# Patient Record
Sex: Female | Born: 1955 | Race: Black or African American | Hispanic: No | Marital: Married | Smoking: Never smoker
Health system: Southern US, Community
[De-identification: ages and names within clinical notes are randomized; demographics above are authoritative.]

## PROBLEM LIST (undated history)

## (undated) DIAGNOSIS — I639 Cerebral infarction, unspecified: Secondary | ICD-10-CM

## (undated) DIAGNOSIS — I82409 Acute embolism and thrombosis of unspecified deep veins of unspecified lower extremity: Secondary | ICD-10-CM

## (undated) DIAGNOSIS — I10 Essential (primary) hypertension: Secondary | ICD-10-CM

## (undated) DIAGNOSIS — E119 Type 2 diabetes mellitus without complications: Secondary | ICD-10-CM

## (undated) DIAGNOSIS — E785 Hyperlipidemia, unspecified: Secondary | ICD-10-CM

## (undated) HISTORY — PX: TUBAL LIGATION: SHX77

---

## 2016-04-02 ENCOUNTER — Ambulatory Visit: Payer: Self-pay | Admitting: Family Medicine

## 2018-08-11 ENCOUNTER — Other Ambulatory Visit: Payer: Self-pay

## 2018-08-18 LAB — CYTOLOGY - PAP: Diagnosis: NEGATIVE

## 2018-08-25 ENCOUNTER — Other Ambulatory Visit (HOSPITAL_COMMUNITY): Payer: Self-pay | Admitting: *Deleted

## 2018-08-25 DIAGNOSIS — Z1231 Encounter for screening mammogram for malignant neoplasm of breast: Secondary | ICD-10-CM

## 2018-10-10 ENCOUNTER — Encounter: Payer: Self-pay | Admitting: Physician Assistant

## 2018-10-10 ENCOUNTER — Ambulatory Visit: Payer: Self-pay | Admitting: Physician Assistant

## 2018-10-10 ENCOUNTER — Telehealth: Payer: Self-pay

## 2018-10-10 VITALS — BP 186/134 | HR 102 | Temp 98.3°F | Resp 16 | Ht 66.0 in | Wt 296.2 lb

## 2018-10-10 DIAGNOSIS — Z130 Encounter for screening for diseases of the blood and blood-forming organs and certain disorders involving the immune mechanism: Secondary | ICD-10-CM

## 2018-10-10 DIAGNOSIS — I10 Essential (primary) hypertension: Secondary | ICD-10-CM

## 2018-10-10 DIAGNOSIS — E1159 Type 2 diabetes mellitus with other circulatory complications: Secondary | ICD-10-CM | POA: Insufficient documentation

## 2018-10-10 DIAGNOSIS — Z1211 Encounter for screening for malignant neoplasm of colon: Secondary | ICD-10-CM

## 2018-10-10 DIAGNOSIS — E119 Type 2 diabetes mellitus without complications: Secondary | ICD-10-CM

## 2018-10-10 DIAGNOSIS — Z114 Encounter for screening for human immunodeficiency virus [HIV]: Secondary | ICD-10-CM

## 2018-10-10 DIAGNOSIS — Z1159 Encounter for screening for other viral diseases: Secondary | ICD-10-CM

## 2018-10-10 DIAGNOSIS — B351 Tinea unguium: Secondary | ICD-10-CM

## 2018-10-10 DIAGNOSIS — E1169 Type 2 diabetes mellitus with other specified complication: Secondary | ICD-10-CM | POA: Insufficient documentation

## 2018-10-10 MED ORDER — GLIMEPIRIDE 2 MG PO TABS: ORAL_TABLET | ORAL | 3 refills | Status: DC

## 2018-10-10 MED ORDER — LISINOPRIL 20 MG PO TABS: 20.0000 mg | ORAL_TABLET | Freq: Every day | ORAL | 0 refills | Status: DC

## 2018-10-10 NOTE — Telephone Encounter (Signed)
No answer LMTCB to talk about one of her medicines.

## 2018-10-10 NOTE — Telephone Encounter (Signed)
Per patient if you want to send something similar to the pharmacy she is fine with that and once her A1C  results come in and you think you need to make additional adjustments that we can let her know then, but right now she needs something to bring her sugar down.

## 2018-10-10 NOTE — Telephone Encounter (Signed)
Sent glimepiride 2 mg daily with a meal x 1 week. Then after one week can increase to 4 mg daily with meal.

## 2018-10-10 NOTE — Patient Instructions (Signed)

## 2018-10-10 NOTE — Progress Notes (Signed)
Patient: Samantha [name], Female    DOB: 07-10-1956, 62 y.o.   MRN: [id number] Visit Date: 10/10/2018  Today's Provider: Trey Sailors, PA-C   Chief Complaint  Patient presents with  . Establish Care   Subjective:    Annual physical exam Samantha [name] is a 62 y.o. female who presents today to establish care. She feels well. She reports exercising none. She reports she is sleeping well. Previously seen at [place], [place] at [place]. 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 Living here in [place] with  Husband of 40 years. Has four adult children. Lives with two adult children. Daughter getting masters in [place]. 23 year old son doing Network engineer. Has two grandchildren, one ages 14 and 2.5.   HTN: Was previously treated with 20 mg Lisinopril for her HTN. She is not having any chest pains or SOB.   BP Readings from Last 3 Encounters:  10/10/18 (!) 186/134   Diabetes: She reports she takes 10 mg of "glimpeiride" daily. Has had DM II since her late fifties. Started out on metformin and experienced itching with this. Last A1c unknown. Hasn't had diabetic eye exam in years. Has large toenails and is requesting referral to podiatry.   Flu shot: declined   PAP Smear: had this done at [place] as part of a cliinc - [facility]  Mammogram: Scheduled 11/2018 Colon Cancer Screening: never. No family history of colon cancer. Will do cologuard.    Wt Readings from Last 3 Encounters:  10/10/18 296 lb 3.2 oz (134.4 kg)    -----------------------------------------------------------------   Review of Systems  Constitutional: Negative.   HENT: Negative.   Eyes: Negative.   Respiratory: Negative.   Cardiovascular: Negative.   Gastrointestinal: Negative.   Endocrine: Negative.   Genitourinary: Negative.   Musculoskeletal: Negative.   Skin: Negative.   Allergic/Immunologic: Negative.   Neurological: Negative.   Hematological: Negative.     Psychiatric/Behavioral: Negative.     Social History      She  reports that she has never smoked. She has never used smokeless tobacco. She reports that she does not drink alcohol or use drugs.       Social History   Socioeconomic History  . Marital status: Married    Spouse name: Not on file  . Number of children: Not on file  . Years of education: Not on file  . Highest education level: Not on file  Occupational History  . Not on file  Social Needs  . Financial resource strain: Not on file  . Food insecurity:    Worry: Not on file    Inability: Not on file  . Transportation needs:    Medical: Not on file    Non-medical: Not on file  Tobacco Use  . Smoking status: Never Smoker  . Smokeless tobacco: Never Used  Substance and Sexual Activity  . Alcohol use: Never    Frequency: Never  . Drug use: Never  . Sexual activity: Not on file  Lifestyle  . Physical activity:    Days per week: Not on file    Minutes per session: Not on file  . Stress: Not on file  Relationships  . Social connections:    Talks on phone: Not on file    Gets together: Not on file    Attends religious service: Not on file    Active member of club or organization: Not on file    Attends meetings of  clubs or organizations: Not on file    Relationship status: Not on file  Other Topics Concern  . Not on file  Social History Narrative  . Not on file    History reviewed. No pertinent past medical history.   Patient Active Problem List   Diagnosis Date Noted  . Type 2 diabetes mellitus without complication, without long-term current use of insulin (HCC) 10/10/2018  . Hypertension 10/10/2018  . Morbid obesity (HCC) 10/10/2018    Past Surgical History:  Procedure Laterality Date  . TUBAL LIGATION     23 yrs ago    Family History        Family Status  Relation Name Status  . Mother  (Not Specified)  . MGM  Deceased       Alzheimers  . MGF  Deceased  . PGM  Deceased        Her  family history includes Hypertension in her mother.      No Known Allergies   Current Outpatient Medications:  .  glimepiride (AMARYL) 2 MG tablet, Start with 2 mg daily with a meal. After one week, can take 4 mg daily with a meal., Disp: 30 tablet, Rfl: 3 .  lisinopril (PRINIVIL,ZESTRIL) 20 MG tablet, Take 1 tablet (20 mg total) by mouth daily., Disp: 90 tablet, Rfl: 0   Patient Care Team: Maryella Shivers as PCP - General (Physician Assistant)      Objective:   Vitals: BP (!) 186/134 (BP Location: Right Arm, Patient Position: Sitting, Cuff Size: Large)   Pulse (!) 102   Temp 98.3 F (36.8 C) (Oral)   Resp 16   Ht 5\' 6"  (1.676 m)   Wt 296 lb 3.2 oz (134.4 kg)   BMI 47.81 kg/m    Vitals:   10/10/18 1417  BP: (!) 186/134  Pulse: (!) 102  Resp: 16  Temp: 98.3 F (36.8 C)  TempSrc: Oral  Weight: 296 lb 3.2 oz (134.4 kg)  Height: 5\' 6"  (1.676 m)     Physical Exam Constitutional:      Appearance: Normal appearance. She is obese.  Cardiovascular:     Rate and Rhythm: Normal rate and regular rhythm.  Pulmonary:     Effort: Pulmonary effort is normal.     Breath sounds: Normal breath sounds.  Skin:    General: Skin is warm and dry.  Neurological:     Mental Status: She is alert.    Diabetic Foot Exam - Simple   Simple Foot Form Diabetic Foot exam was performed with the following findings:  Yes 10/10/2018  4:50 PM  Visual Inspection No deformities, no ulcerations, no other skin breakdown bilaterally:  Yes Sensation Testing Intact to touch and monofilament testing bilaterally:  Yes Pulse Check Posterior Tibialis and Dorsalis pulse intact bilaterally:  Yes Comments Sensation present and normal. Onychomycosis present bilaterally.       Depression Screen PHQ 2/9 Scores 10/10/2018  PHQ - 2 Score 0      Assessment & Plan:     Routine Health Maintenance and Physical Exam  Exercise Activities and Dietary recommendations Goals   None       There is no immunization history on file for this patient.  Health Maintenance  Topic Date Due  . HEMOGLOBIN A1C  02-10-1956  . Hepatitis C Screening  26-May-1956  . PNEUMOCOCCAL POLYSACCHARIDE VACCINE AGE 35-64 HIGH RISK  03/16/1959  . FOOT EXAM  03/16/1967  . OPHTHALMOLOGY EXAM  03/16/1967  . HIV Screening  03/15/1972  . TETANUS/TDAP  03/15/1976  . MAMMOGRAM  03/16/2007  . COLONOSCOPY  03/16/2007  . INFLUENZA VACCINE  05/29/2018  . PAP SMEAR-Modifier  08/11/2021     Discussed health benefits of physical activity, and encouraged her to engage in regular exercise appropriate for her age and condition.    1. Type 2 diabetes mellitus without complication, without long-term current use of insulin (HCC)  She is unsure of her dose/name of medication. She says she was on "10 mg of glimepiride" but the max dose is 8 mg. We called pharmacy and she never had it filled there. She has been out of this for forty days. Counseled we can wait for A1c and start different medication that could help her lose weight. She wishes to start something now. Will send in 2 mg glimepiride and can increase to 4 mg after one week. Requesting records from previous PCP.  - Ambulatory referral to Ophthalmology - HgB A1c - Lipid Profile - Comprehensive Metabolic Panel (CMET) - Ambulatory referral to Podiatry  2. Colon cancer screening  - Cologuard  3. Hypertension, unspecified type  Restart 20 mg today. See her back in two weeks this is very elevated.  - lisinopril (PRINIVIL,ZESTRIL) 20 MG tablet; Take 1 tablet (20 mg total) by mouth daily.  Dispense: 90 tablet; Refill: 0  4. Screening for deficiency anemia  - CBC with Differential  5. Encounter for screening for HIV  - HIV antibody (with reflex)  6. Need for hepatitis C screening test  - Hepatitis C antibody  7. Onychomycosis  - Ambulatory referral to Podiatry  8. Morbid obesity (HCC)  Counseled on weight loss, can potentially change  dm medication to facilitate this.  Return in about 2 weeks (around 10/24/2018) for HTN.  The entirety of the information documented in the History of Present Illness, Review of Systems and Physical Exam were personally obtained by me. Portions of this information were initially documented by Hetty ElyJoseline Rosas, CMA and reviewed by me for thoroughness and accuracy.       --------------------------------------------------------------------    Trey SailorsAdriana M Pollak, PA-C  [facility]

## 2018-10-10 NOTE — Addendum Note (Signed)
Addended by: Trey SailorsPOLLAK, ADRIANA M on: 10/10/2018 04:31 PM   Modules accepted: Orders

## 2018-10-13 ENCOUNTER — Telehealth: Payer: Self-pay

## 2018-10-13 LAB — CBC WITH DIFFERENTIAL/PLATELET
Basophils Absolute: 0 10*3/uL (ref 0.0–0.2)
Basos: 0 %
EOS (ABSOLUTE): 0.2 10*3/uL (ref 0.0–0.4)
Eos: 3 %
Hematocrit: 40.7 % (ref 34.0–46.6)
Hemoglobin: 13.1 g/dL (ref 11.1–15.9)
Immature Grans (Abs): 0 10*3/uL (ref 0.0–0.1)
Immature Granulocytes: 0 %
Lymphocytes Absolute: 1.9 10*3/uL (ref 0.7–3.1)
Lymphs: 27 %
MCH: 30 pg (ref 26.6–33.0)
MCHC: 32.2 g/dL (ref 31.5–35.7)
MCV: 93 fL (ref 79–97)
Monocytes Absolute: 0.5 10*3/uL (ref 0.1–0.9)
Monocytes: 7 %
Neutrophils Absolute: 4.5 10*3/uL (ref 1.4–7.0)
Neutrophils: 63 %
Platelets: 158 10*3/uL (ref 150–450)
RBC: 4.36 x10E6/uL (ref 3.77–5.28)
RDW: 10.9 % — ABNORMAL LOW (ref 12.3–15.4)
WBC: 7.2 10*3/uL (ref 3.4–10.8)

## 2018-10-13 LAB — COMPREHENSIVE METABOLIC PANEL
ALT: 18 IU/L (ref 0–32)
AST: 16 IU/L (ref 0–40)
Albumin/Globulin Ratio: 1.1 — ABNORMAL LOW (ref 1.2–2.2)
Albumin: 3.7 g/dL (ref 3.6–4.8)
Alkaline Phosphatase: 96 IU/L (ref 39–117)
BUN/Creatinine Ratio: 15 (ref 12–28)
BUN: 12 mg/dL (ref 8–27)
Bilirubin Total: 0.3 mg/dL (ref 0.0–1.2)
CO2: 24 mmol/L (ref 20–29)
Calcium: 9.3 mg/dL (ref 8.7–10.3)
Chloride: 98 mmol/L (ref 96–106)
Creatinine, Ser: 0.79 mg/dL (ref 0.57–1.00)
GFR calc Af Amer: 93 mL/min/{1.73_m2} (ref >59)
GFR calc non Af Amer: 81 mL/min/{1.73_m2} (ref >59)
Globulin, Total: 3.3 g/dL (ref 1.5–4.5)
Glucose: 421 mg/dL — ABNORMAL HIGH (ref 65–99)
Potassium: 4.3 mmol/L (ref 3.5–5.2)
Sodium: 137 mmol/L (ref 134–144)
Total Protein: 7 g/dL (ref 6.0–8.5)

## 2018-10-13 LAB — HEMOGLOBIN A1C
Est. average glucose Bld gHb Est-mCnc: 358 mg/dL
Hgb A1c MFr Bld: 14.1 % — ABNORMAL HIGH (ref 4.8–5.6)

## 2018-10-13 LAB — LIPID PANEL
Chol/HDL Ratio: 3.4 ratio (ref 0.0–4.4)
Cholesterol, Total: 229 mg/dL — ABNORMAL HIGH (ref 100–199)
HDL: 68 mg/dL (ref >39)
LDL Calculated: 143 mg/dL — ABNORMAL HIGH (ref 0–99)
Triglycerides: 91 mg/dL (ref 0–149)
VLDL Cholesterol Cal: 18 mg/dL (ref 5–40)

## 2018-10-13 LAB — HEPATITIS C ANTIBODY: Hep C Virus Ab: <0.1 s/co ratio (ref 0.0–0.9)

## 2018-10-13 LAB — HIV ANTIBODY (ROUTINE TESTING W REFLEX): HIV Screen 4th Generation wRfx: NONREACTIVE

## 2018-10-13 NOTE — Telephone Encounter (Signed)
Patient advised as below. Patient verbalizes understanding and is in agreement with treatment plan.  

## 2018-10-13 NOTE — Telephone Encounter (Signed)
Patient advised as directed below.  Thanks,  -Emelee Rodocker 

## 2018-10-13 NOTE — Telephone Encounter (Signed)
LMTCB

## 2018-10-13 NOTE — Telephone Encounter (Signed)
-----   Message from Trey SailorsAdriana M Pollak, [place]PA-C sent at 10/13/2018  2:07 PM EST ----- A1c is 14.1. This is very high and very uncontrolled and she needs to be sure come to her follow up visit so we can talk about different medications for this. CBC normal. CMET normal except for very high sugar which is to be expected with her A1c. Cholesterol uncontrolled, will talk about at follow up. HIV and hepatitis C negative.

## 2018-10-17 ENCOUNTER — Telehealth: Payer: Self-pay

## 2018-10-17 NOTE — Telephone Encounter (Signed)
No answer/LMTCB  Patient is self pay and want to ask if she still wants to proceed with the Cologuard or we can do another test in office.

## 2018-10-20 NOTE — Telephone Encounter (Signed)
Call can not be completed at this time.  °

## 2018-10-20 NOTE — Telephone Encounter (Signed)
Noted thanks. This patient should be coming in for follow up and we can discuss it then.

## 2018-10-20 NOTE — Telephone Encounter (Signed)
Patient has appointment schedule for 10/31/2018.

## 2018-10-31 ENCOUNTER — Ambulatory Visit: Payer: Self-pay | Admitting: Physician Assistant

## 2018-12-02 ENCOUNTER — Ambulatory Visit (HOSPITAL_COMMUNITY): Payer: Self-pay

## 2018-12-19 ENCOUNTER — Telehealth: Payer: Self-pay | Admitting: Physician Assistant

## 2018-12-19 NOTE — Telephone Encounter (Signed)
-----   Message from Barrie Lyme sent at 12/15/2018  8:34 AM EST ----- Review incoming fax

## 2018-12-19 NOTE — Telephone Encounter (Signed)
Please call patient and schedule her for diabetes follow up. Her diabetes and blood pressure are extremely uncontrolled and she needs to come into clinic to address this.

## 2018-12-19 NOTE — Telephone Encounter (Signed)
lmtcb

## 2018-12-23 NOTE — Telephone Encounter (Signed)
lmtcb

## 2018-12-26 NOTE — Telephone Encounter (Signed)
lmtcb

## 2019-01-04 ENCOUNTER — Other Ambulatory Visit: Payer: Self-pay | Admitting: Physician Assistant

## 2019-01-04 DIAGNOSIS — E119 Type 2 diabetes mellitus without complications: Secondary | ICD-10-CM

## 2019-01-07 MED ORDER — GLIMEPIRIDE 4 MG PO TABS: 4.0000 mg | ORAL_TABLET | Freq: Every day | ORAL | 0 refills | Status: DC

## 2019-01-07 NOTE — Telephone Encounter (Signed)
Patient's diabetes is severely uncontrolled. This is very dangerous. She needs to come in for appointment before more refills. Have given 30 days of glimepiride and we need to address this in office.

## 2019-01-07 NOTE — Telephone Encounter (Signed)
Unable to reach patient at this time no voicemail box has been set up yet. KW

## 2019-01-28 ENCOUNTER — Other Ambulatory Visit: Payer: Self-pay

## 2019-01-28 DIAGNOSIS — E119 Type 2 diabetes mellitus without complications: Secondary | ICD-10-CM

## 2019-01-28 DIAGNOSIS — I10 Essential (primary) hypertension: Secondary | ICD-10-CM

## 2019-01-29 MED ORDER — GLIMEPIRIDE 4 MG PO TABS: 4.0000 mg | ORAL_TABLET | Freq: Every day | ORAL | 0 refills | Status: DC

## 2019-01-29 MED ORDER — LISINOPRIL 20 MG PO TABS: 20.0000 mg | ORAL_TABLET | Freq: Every day | ORAL | 0 refills | Status: DC

## 2019-02-02 ENCOUNTER — Ambulatory Visit: Payer: Self-pay | Admitting: Physician Assistant

## 2019-02-02 NOTE — Progress Notes (Deleted)
       Patient: Samantha [name] Female    DOB: 09/23/1956   63 y.o.   MRN: [id number] Visit Date: 02/02/2019  Today's Provider: Trey Sailors, PA-C   No chief complaint on file.  Subjective:     HPI  Diabetes Mellitus Type II, Follow-up:   Lab Results  Component Value Date   HGBA1C 14.1 (H) 10/10/2018    Last seen for diabetes {1-12:18279} {days/wks/mos/yrs:310907} ago.  Management since then includes ***. She reports {excellent/good/fair/poor:19665} compliance with treatment. She {ACTION; IS/IS RAX:[id number]} having side effects. *** Current symptoms include {Symptoms; diabetes:14075} and have been {Desc; course:15616}. Home blood sugar records: {diabetes glucometry results:16657}  Episodes of hypoglycemia? {yes***/no:17258}   Current Insulin Regimen: *** Most Recent Eye Exam: *** Weight trend: {trend:16658} {Prior visit with dietician:20300:::1} {Current exercise:16438:::1} {Current diet habits:16563:::1}  Pertinent Labs:    Component Value Date/Time   CHOL 229 (H) 10/10/2018 1518   TRIG 91 10/10/2018 1518   HDL 68 10/10/2018 1518   LDLCALC 143 (H) 10/10/2018 1518   CREATININE 0.79 10/10/2018 1518    Wt Readings from Last 3 Encounters:  10/10/18 296 lb 3.2 oz (134.4 kg)    ------------------------------------------------------------------------  No Known Allergies   Current Outpatient Medications:  .  glimepiride (AMARYL) 4 MG tablet, Take 1 tablet (4 mg total) by mouth daily before breakfast., Disp: 30 tablet, Rfl: 0 .  lisinopril (PRINIVIL,ZESTRIL) 20 MG tablet, Take 1 tablet (20 mg total) by mouth daily., Disp: 90 tablet, Rfl: 0  Review of Systems  Constitutional: Negative.   Eyes: Negative.   Cardiovascular: Negative.   Endocrine: Negative.     Social History   Tobacco Use  . Smoking status: Never Smoker  . Smokeless tobacco: Never Used  Substance Use Topics  . Alcohol use: Never    Frequency: Never      Objective:   There were no  vitals taken for this visit. There were no vitals filed for this visit.   Physical Exam      Assessment & Plan        Trey Sailors, PA-C  [facility]

## 2019-03-13 ENCOUNTER — Other Ambulatory Visit: Payer: Self-pay

## 2019-03-13 DIAGNOSIS — E119 Type 2 diabetes mellitus without complications: Secondary | ICD-10-CM

## 2019-03-13 MED ORDER — GLIMEPIRIDE 4 MG PO TABS: 4.0000 mg | ORAL_TABLET | Freq: Every day | ORAL | 0 refills | Status: DC

## 2019-03-13 NOTE — Telephone Encounter (Signed)
Patient called requesting refills on medications. She reports that she has a follow up appt with you on 03/17/19

## 2019-03-13 NOTE — Telephone Encounter (Signed)
I have given four days to get her to the appointment. Must keep appointment.

## 2019-03-17 ENCOUNTER — Encounter: Payer: Self-pay | Admitting: Physician Assistant

## 2019-03-17 ENCOUNTER — Ambulatory Visit (INDEPENDENT_AMBULATORY_CARE_PROVIDER_SITE_OTHER): Payer: Self-pay | Admitting: Physician Assistant

## 2019-03-17 ENCOUNTER — Other Ambulatory Visit: Payer: Self-pay

## 2019-03-17 VITALS — BP 206/118 | HR 103 | Temp 98.6°F | Resp 16 | Wt 296.2 lb

## 2019-03-17 DIAGNOSIS — I10 Essential (primary) hypertension: Secondary | ICD-10-CM

## 2019-03-17 DIAGNOSIS — E119 Type 2 diabetes mellitus without complications: Secondary | ICD-10-CM

## 2019-03-17 LAB — POCT GLYCOSYLATED HEMOGLOBIN (HGB A1C)
Est. average glucose Bld gHb Est-mCnc: 263
Hemoglobin A1C: 10.8 % — AB (ref 4.0–5.6)

## 2019-03-17 MED ORDER — LISINOPRIL 40 MG PO TABS: 40.0000 mg | ORAL_TABLET | Freq: Every day | ORAL | 0 refills | Status: DC

## 2019-03-17 MED ORDER — GLIMEPIRIDE 4 MG PO TABS: ORAL_TABLET | ORAL | 0 refills | Status: DC

## 2019-03-17 NOTE — Patient Instructions (Addendum)
Take one and a half pills of glimepiride 4 mg daily for two weeks. Then take 2 pills of 4mg  glimepiride daily. This is for diabetes. We are increasing Lisinopril to 40 mg daily.     Diabetes Mellitus and Nutrition, Adult When you have diabetes (diabetes mellitus), it is very important to have healthy eating habits because your blood sugar (glucose) levels are greatly affected by what you eat and drink. Eating healthy foods in the appropriate amounts, at about the same times every day, can help you:  Control your blood glucose.  Lower your risk of heart disease.  Improve your blood pressure.  Reach or maintain a healthy weight. Every person with diabetes is different, and each person has different needs for a meal plan. Your health care provider may recommend that you work with a diet and nutrition specialist (dietitian) to make a meal plan that is best for you. Your meal plan may vary depending on factors such as:  The calories you need.  The medicines you take.  Your weight.  Your blood glucose, blood pressure, and cholesterol levels.  Your activity level.  Other health conditions you have, such as heart or kidney disease. How do carbohydrates affect me? Carbohydrates, also called carbs, affect your blood glucose level more than any other type of food. Eating carbs naturally raises the amount of glucose in your blood. Carb counting is a method for keeping track of how many carbs you eat. Counting carbs is important to keep your blood glucose at a healthy level, especially if you use insulin or take certain oral diabetes medicines. It is important to know how many carbs you can safely have in each meal. This is different for every person. Your dietitian can help you calculate how many carbs you should have at each meal and for each snack. Foods that contain carbs include:  Bread, cereal, rice, pasta, and crackers.  Potatoes and corn.  Peas, beans, and lentils.  Milk and yogurt.   Fruit and juice.  Desserts, such as cakes, cookies, ice cream, and candy. How does alcohol affect me? Alcohol can cause a sudden decrease in blood glucose (hypoglycemia), especially if you use insulin or take certain oral diabetes medicines. Hypoglycemia can be a life-threatening condition. Symptoms of hypoglycemia (sleepiness, dizziness, and confusion) are similar to symptoms of having too much alcohol. If your health care provider says that alcohol is safe for you, follow these guidelines:  Limit alcohol intake to no more than 1 drink per day for nonpregnant women and 2 drinks per day for men. One drink equals 12 oz of beer, 5 oz of wine, or 1 oz of hard liquor.  Do not drink on an empty stomach.  Keep yourself hydrated with water, diet soda, or unsweetened iced tea.  Keep in mind that regular soda, juice, and other mixers may contain a lot of sugar and must be counted as carbs. What are tips for following this plan?  Reading food labels  Start by checking the serving size on the "Nutrition Facts" label of packaged foods and drinks. The amount of calories, carbs, fats, and other nutrients listed on the label is based on one serving of the item. Many items contain more than one serving per package.  Check the total grams (g) of carbs in one serving. You can calculate the number of servings of carbs in one serving by dividing the total carbs by 15. For example, if a food has 30 g of total carbs, it would  be equal to 2 servings of carbs.  Check the number of grams (g) of saturated and trans fats in one serving. Choose foods that have low or no amount of these fats.  Check the number of milligrams (mg) of salt (sodium) in one serving. Most people should limit total sodium intake to less than 2,300 mg per day.  Always check the nutrition information of foods labeled as "low-fat" or "nonfat". These foods may be higher in added sugar or refined carbs and should be avoided.  Talk to your  dietitian to identify your daily goals for nutrients listed on the label. Shopping  Avoid buying canned, premade, or processed foods. These foods tend to be high in fat, sodium, and added sugar.  Shop around the outside edge of the grocery store. This includes fresh fruits and vegetables, bulk grains, fresh meats, and fresh dairy. Cooking  Use low-heat cooking methods, such as baking, instead of high-heat cooking methods like deep frying.  Cook using healthy oils, such as olive, canola, or sunflower oil.  Avoid cooking with butter, cream, or high-fat meats. Meal planning  Eat meals and snacks regularly, preferably at the same times every day. Avoid going long periods of time without eating.  Eat foods high in fiber, such as fresh fruits, vegetables, beans, and whole grains. Talk to your dietitian about how many servings of carbs you can eat at each meal.  Eat 4-6 ounces (oz) of lean protein each day, such as lean meat, chicken, fish, eggs, or tofu. One oz of lean protein is equal to: ? 1 oz of meat, chicken, or fish. ? 1 egg. ?  cup of tofu.  Eat some foods each day that contain healthy fats, such as avocado, nuts, seeds, and fish. Lifestyle  Check your blood glucose regularly.  Exercise regularly as told by your health care provider. This may include: ? 150 minutes of moderate-intensity or vigorous-intensity exercise each week. This could be brisk walking, biking, or water aerobics. ? Stretching and doing strength exercises, such as yoga or weightlifting, at least 2 times a week.  Take medicines as told by your health care provider.  Do not use any products that contain nicotine or tobacco, such as cigarettes and e-cigarettes. If you need help quitting, ask your health care provider.  Work with a Veterinary surgeoncounselor or diabetes educator to identify strategies to manage stress and any emotional and social challenges. Questions to ask a health care provider  Do I need to meet with a  diabetes educator?  Do I need to meet with a dietitian?  What number can I call if I have questions?  When are the best times to check my blood glucose? Where to find more information:  American Diabetes Association: diabetes.org  Academy of Nutrition and Dietetics: www.eatright.[employer]  National Institute of Diabetes and Digestive and Kidney Diseases (NIH): CarFlippers.tnwww.niddk.nih.gov Summary  A healthy meal plan will help you control your blood glucose and maintain a healthy lifestyle.  Working with a diet and nutrition specialist (dietitian) can help you make a meal plan that is best for you.  Keep in mind that carbohydrates (carbs) and alcohol have immediate effects on your blood glucose levels. It is important to count carbs and to use alcohol carefully. This information is not intended to replace advice given to you by your health care provider. Make sure you discuss any questions you have with your health care provider. Document Released: 07/12/2005 Document Revised: 05/15/2017 Document Reviewed: 11/19/2016 Elsevier Interactive Patient Education  2019 [place].

## 2019-03-17 NOTE — Progress Notes (Signed)
Patient: Samantha [name] Female    DOB: 1956/05/14   63 y.o.   MRN: [id number] Visit Date: 03/17/2019  Today's Provider: Trey SailorsAdriana M Janeice Stegall, PA-C   Chief Complaint  Patient presents with  . Follow-up    T2DM and HTN   Subjective:     HPI   Diabetes Mellitus Type II, Follow-up:   Lab Results  Component Value Date   HGBA1C 10.8 (A) 03/17/2019   HGBA1C 14.1 (H) 10/10/2018   Last seen for diabetes 5 months ago.  Management since then includes started patient on 2 mg glimepiride and can increase to 4 mg after one week.  She reports good compliance with treatment. She is not having side effects.  Current symptoms include none and have been stable. Home blood sugar records: sometimes and when she does is at  200's +  Episodes of hypoglycemia? no   Current Insulin Regimen:  Most Recent Eye Exam: she is due. She needs to schedule an appt. Weight trend: stable Current diet: in general, a "healthy" diet   Current exercise: walking and weights  ------------------------------------------------------------------------   Hypertension, follow-up:  BP Readings from Last 3 Encounters:  03/17/19 (!) 206/118  10/10/18 (!) 186/134    She was last seen for hypertension 5 months ago.  BP at that visit was 186/134 Management since that visit includes Restart Lisinopril 20 mg .Return in two weeks. She reports excellent compliance with treatment. She is not having side effects.  She is adherent to low salt diet.   Outside blood pressures are 140's/88's She is experiencing none.  Patient denies chest pain, chest pressure/discomfort, exertional chest pressure/discomfort, fatigue, irregular heart beat, lower extremity edema, near-syncope and palpitations.   Cardiovascular risk factors include diabetes mellitus, hypertension and obesity (BMI >= 30 kg/m2).   Wt Readings from Last 3 Encounters:  03/17/19 296 lb 3.2 oz (134.4 kg)  10/10/18 296 lb 3.2 oz (134.4 kg)   Reports she is  waiting for her insurance to activate. Reports she has trouble attending appointments because she has multiple children and also several grandchildren who live with her.   ------------------------------------------------------------------------     No Known Allergies   Current Outpatient Medications:  .  glimepiride (AMARYL) 4 MG tablet, Take 1 tablet (4 mg total) by mouth daily before breakfast., Disp: 4 tablet, Rfl: 0 .  lisinopril (PRINIVIL,ZESTRIL) 20 MG tablet, Take 1 tablet (20 mg total) by mouth daily., Disp: 90 tablet, Rfl: 0  Review of Systems  Social History   Tobacco Use  . Smoking status: Never Smoker  . Smokeless tobacco: Never Used  Substance Use Topics  . Alcohol use: Never    Frequency: Never      Objective:   BP (!) 206/118 (BP Location: Left Arm, Patient Position: Sitting, Cuff Size: Large)   Pulse (!) 103   Temp 98.6 F (37 C) (Oral)   Resp 16   Wt 296 lb 3.2 oz (134.4 kg)   BMI 47.81 kg/m  Vitals:   03/17/19 1110  BP: (!) 206/118  Pulse: (!) 103  Resp: 16  Temp: 98.6 F (37 C)  TempSrc: Oral  Weight: 296 lb 3.2 oz (134.4 kg)     Physical Exam Constitutional:      Appearance: Normal appearance. She is obese.  Cardiovascular:     Rate and Rhythm: Normal rate and regular rhythm.     Heart sounds: Normal heart sounds.  Pulmonary:     Effort: Pulmonary effort is normal.  Breath sounds: Normal breath sounds.  Skin:    General: Skin is warm and dry.  Neurological:     Mental Status: She is alert. Mental status is at baseline.         Assessment & Plan    1. Type 2 diabetes mellitus without complication, without long-term current use of insulin (HCC)  A1c has decreased from 14.1% to 10.8% and remains uncontrolled. I will have her titrate up on glimepiride as below. She has an intolerance to metformin and no insurance coverage right now so I think this will be the most affordable to her right now. Counseled that she needs to be  compliant with office visits.   - POCT glycosylated hemoglobin (Hb A1C) - glimepiride (AMARYL) 4 MG tablet; Take 1.5 tablets (6 mg total) by mouth daily before breakfast for 14 days, THEN 2 tablets (8 mg total) daily before breakfast.  Dispense: 161 tablet; Refill: 0  2. Hypertension, unspecified type  Very uncontrolled today. She is asymptomatic. She reports it can be lower than this. She reports when she was on Lisinopril 20 mg previously that her BP was controlled. Will increase lisinopril to 40 mg daily as patient does not want to add second medication right now. Follow up 2-4 weeks.   - lisinopril (ZESTRIL) 40 MG tablet; Take 1 tablet (40 mg total) by mouth daily.  Dispense: 90 tablet; Refill: 0  The entirety of the information documented in the History of Present Illness, Review of Systems and Physical Exam were personally obtained by me. Portions of this information were initially documented by Sheliah Hatch, CMA and reviewed by me for thoroughness and accuracy.         Trey Sailors, PA-C  [facility]

## 2019-07-30 ENCOUNTER — Other Ambulatory Visit: Payer: Self-pay | Admitting: Physician Assistant

## 2019-07-30 DIAGNOSIS — E119 Type 2 diabetes mellitus without complications: Secondary | ICD-10-CM

## 2019-07-30 DIAGNOSIS — I10 Essential (primary) hypertension: Secondary | ICD-10-CM

## 2019-07-30 NOTE — Telephone Encounter (Signed)
Pt has OV 08/04/2019, but was supposed to come back in June I think.  Please advise.   Thanks,   -Mickel Baas

## 2019-07-30 NOTE — Telephone Encounter (Signed)
Gave 30 days.

## 2019-08-04 ENCOUNTER — Ambulatory Visit: Payer: Self-pay | Admitting: Physician Assistant

## 2019-09-22 ENCOUNTER — Ambulatory Visit (INDEPENDENT_AMBULATORY_CARE_PROVIDER_SITE_OTHER): Payer: Self-pay | Admitting: Physician Assistant

## 2019-09-22 ENCOUNTER — Encounter: Payer: Self-pay | Admitting: Physician Assistant

## 2019-09-22 ENCOUNTER — Other Ambulatory Visit: Payer: Self-pay

## 2019-09-22 VITALS — BP 176/111 | HR 65 | Temp 97.1°F | Wt 279.6 lb

## 2019-09-22 DIAGNOSIS — R0781 Pleurodynia: Secondary | ICD-10-CM

## 2019-09-22 DIAGNOSIS — E119 Type 2 diabetes mellitus without complications: Secondary | ICD-10-CM

## 2019-09-22 DIAGNOSIS — E785 Hyperlipidemia, unspecified: Secondary | ICD-10-CM

## 2019-09-22 DIAGNOSIS — S0990XA Unspecified injury of head, initial encounter: Secondary | ICD-10-CM

## 2019-09-22 DIAGNOSIS — I10 Essential (primary) hypertension: Secondary | ICD-10-CM

## 2019-09-22 LAB — POCT GLYCOSYLATED HEMOGLOBIN (HGB A1C)
Est. average glucose Bld gHb Est-mCnc: 272
Hemoglobin A1C: 11.1 % — AB (ref 4.0–5.6)

## 2019-09-22 MED ORDER — AMLODIPINE BESYLATE 5 MG PO TABS: ORAL_TABLET | ORAL | 0 refills | Status: DC

## 2019-09-22 MED ORDER — LISINOPRIL 40 MG PO TABS: 40.0000 mg | ORAL_TABLET | Freq: Every day | ORAL | 1 refills | Status: DC

## 2019-09-22 MED ORDER — GLIMEPIRIDE 4 MG PO TABS: 8.0000 mg | ORAL_TABLET | Freq: Every day | ORAL | 2 refills | Status: DC

## 2019-09-22 NOTE — Patient Instructions (Signed)
Diabetes Mellitus and Exercise Exercising regularly is important for your overall health, especially when you have diabetes (diabetes mellitus). Exercising is not only about losing weight. It has many other health benefits, such as increasing muscle strength and bone density and reducing body fat and stress. This leads to improved fitness, flexibility, and endurance, all of which result in better overall health. Exercise has additional benefits for people with diabetes, including:  Reducing appetite.  Helping to lower and control blood glucose.  Lowering blood pressure.  Helping to control amounts of fatty substances (lipids) in the blood, such as cholesterol and triglycerides.  Helping the body to respond better to insulin (improving insulin sensitivity).  Reducing how much insulin the body needs.  Decreasing the risk for heart disease by: ? Lowering cholesterol and triglyceride levels. ? Increasing the levels of good cholesterol. ? Lowering blood glucose levels. What is my activity plan? Your health care provider or certified diabetes educator can help you make a plan for the type and frequency of exercise (activity plan) that works for you. Make sure that you:  Do at least 150 minutes of moderate-intensity or vigorous-intensity exercise each week. This could be brisk walking, biking, or water aerobics. ? Do stretching and strength exercises, such as yoga or weightlifting, at least 2 times a week. ? Spread out your activity over at least 3 days of the week.  Get some form of physical activity every day. ? Do not go more than 2 days in a row without some kind of physical activity. ? Avoid being inactive for more than 30 minutes at a time. Take frequent breaks to walk or stretch.  Choose a type of exercise or activity that you enjoy, and set realistic goals.  Start slowly, and gradually increase the intensity of your exercise over time. What do I need to know about managing my  diabetes?   Check your blood glucose before and after exercising. ? If your blood glucose is 240 mg/dL (13.3 mmol/L) or higher before you exercise, check your urine for ketones. If you have ketones in your urine, do not exercise until your blood glucose returns to normal. ? If your blood glucose is 100 mg/dL (5.6 mmol/L) or lower, eat a snack containing 15-20 grams of carbohydrate. Check your blood glucose 15 minutes after the snack to make sure that your level is above 100 mg/dL (5.6 mmol/L) before you start your exercise.  Know the symptoms of low blood glucose (hypoglycemia) and how to treat it. Your risk for hypoglycemia increases during and after exercise. Common symptoms of hypoglycemia can include: ? Hunger. ? Anxiety. ? Sweating and feeling clammy. ? Confusion. ? Dizziness or feeling light-headed. ? Increased heart rate or palpitations. ? Blurry vision. ? Tingling or numbness around the mouth, lips, or tongue. ? Tremors or shakes. ? Irritability.  Keep a rapid-acting carbohydrate snack available before, during, and after exercise to help prevent or treat hypoglycemia.  Avoid injecting insulin into areas of the body that are going to be exercised. For example, avoid injecting insulin into: ? The arms, when playing tennis. ? The legs, when jogging.  Keep records of your exercise habits. Doing this can help you and your health care provider adjust your diabetes management plan as needed. Write down: ? Food that you eat before and after you exercise. ? Blood glucose levels before and after you exercise. ? The type and amount of exercise you have done. ? When your insulin is expected to peak, if you use   insulin. Avoid exercising at times when your insulin is peaking.  When you start a new exercise or activity, work with your health care provider to make sure the activity is safe for you, and to adjust your insulin, medicines, or food intake as needed.  Drink plenty of water while  you exercise to prevent dehydration or heat stroke. Drink enough fluid to keep your urine clear or pale yellow. Summary  Exercising regularly is important for your overall health, especially when you have diabetes (diabetes mellitus).  Exercising has many health benefits, such as increasing muscle strength and bone density and reducing body fat and stress.  Your health care provider or certified diabetes educator can help you make a plan for the type and frequency of exercise (activity plan) that works for you.  When you start a new exercise or activity, work with your health care provider to make sure the activity is safe for you, and to adjust your insulin, medicines, or food intake as needed. This information is not intended to replace advice given to you by your health care provider. Make sure you discuss any questions you have with your health care provider. Document Released: 01/05/2004 Document Revised: 05/09/2017 Document Reviewed: 03/26/2016 Elsevier Patient Education  2020 Elsevier Inc.  

## 2019-09-22 NOTE — Progress Notes (Signed)
Patient: Samantha [name] Female    DOB: 1956-04-04   63 y.o.   MRN: [id number] Visit Date: 09/22/2019  Today's Provider: Trinna Post, PA-C   Chief Complaint  Patient presents with  . Hypertension  . Diabetes   Subjective:     HPI  Diabetes Mellitus Type II, Follow-up:   Lab Results  Component Value Date   HGBA1C 10.8 (A) 03/17/2019   HGBA1C 14.1 (H) 10/10/2018   Last seen for diabetes 6 months ago.  Management since then includes increasing glimepiride to 8 mg daily. She reports good compliance with treatment. She is not having side effects.  Current symptoms include none and have been stable. Home blood sugar records: 200's  Episodes of hypoglycemia? no   Current Insulin Regimen: no Most Recent Eye Exam: Not UTD Weight trend: stable Prior visit with dietician: no Current diet: well balanced Current exercise: none  ------------------------------------------------------------------------   Hypertension, follow-up:  BP Readings from Last 3 Encounters:  09/22/19 (!) 176/111  03/17/19 (!) 206/118  10/10/18 (!) 186/134    She was last seen for hypertension 6 months ago.  BP at that visit was 206/118. Management since that visit includes increasing Lisinopril to 40 mg QD.She reports good compliance with treatment. She is not having side effects.  She is not exercising. She is adherent to low salt diet.   Outside blood pressures are not checked. She is experiencing none.  Patient denies chest pain, chest pressure/discomfort, exertional chest pressure/discomfort, fatigue, irregular heart beat and palpitations.   Cardiovascular risk factors include diabetes mellitus and hypertension.  Use of agents associated with hypertension: none.   Monday 16, 2020   Wt Readings from Last 3 Encounters:  09/22/19 279 lb 9.6 oz (126.8 kg)  03/17/19 296 lb 3.2 oz (134.4 kg)  10/10/18 296 lb 3.2 oz (134.4 kg)   Head Injury: Patient fell 09/14/2019. She has  bruises below her eyes that appeared within three days of injury. She denies headache or rhinorrhea. Denies confusion. Denies vomiting. Reports pain around abdomen and ribcage that does not increase with breathing. Denies fever, chills, vision change.  ------------------------------------------------------------------------  No Known Allergies   Current Outpatient Medications:  .  lisinopril (ZESTRIL) 40 MG tablet, Take 1 tablet by mouth once daily, Disp: 30 tablet, Rfl: 0 .  glimepiride (AMARYL) 4 MG tablet, Take 2 tablets (8 mg total) by mouth daily with breakfast., Disp: 60 tablet, Rfl: 0  Review of Systems  Constitutional: Negative.   Respiratory: Negative.   Genitourinary: Negative.   Neurological: Negative.     Social History   Tobacco Use  . Smoking status: Never Smoker  . Smokeless tobacco: Never Used  Substance Use Topics  . Alcohol use: Never    Frequency: Never      Objective:   BP (!) 176/111 (BP Location: Left Arm, Patient Position: Sitting, Cuff Size: Large)   Pulse 65   Temp (!) 97.1 F (36.2 C) (Temporal)   Wt 279 lb 9.6 oz (126.8 kg)   BMI 45.13 kg/m  Vitals:   09/22/19 1610  BP: (!) 176/111  Pulse: 65  Temp: (!) 97.1 F (36.2 C)  TempSrc: Temporal  Weight: 279 lb 9.6 oz (126.8 kg)  Body mass index is 45.13 kg/m.   Physical Exam Constitutional:      Appearance: Normal appearance.  HENT:     Head: Raccoon eyes present. No Battle's sign, abrasion, contusion or laceration.     Right Ear: Tympanic membrane normal.  Left Ear: Tympanic membrane normal.  Eyes:     Extraocular Movements: Extraocular movements intact.     Conjunctiva/sclera: Conjunctivae normal.     Pupils: Pupils are equal, round, and reactive to light.  Cardiovascular:     Rate and Rhythm: Normal rate and regular rhythm.     Heart sounds: Normal heart sounds.  Pulmonary:     Effort: Pulmonary effort is normal.     Breath sounds: Normal breath sounds.  Abdominal:      General: Bowel sounds are normal.     Palpations: Abdomen is soft.  Skin:    General: Skin is warm and dry.  Neurological:     Mental Status: She is alert and oriented to person, place, and time. Mental status is at baseline.     GCS: GCS eye subscore is 4. GCS verbal subscore is 5. GCS motor subscore is 6.  Psychiatric:        Mood and Affect: Mood normal.        Behavior: Behavior normal.      No results found for any visits on 09/22/19.     Assessment & Plan    1. Type 2 diabetes mellitus without complication, without long-term current use of insulin (HCC)  Patient prescribed glimepiride 4 mg 60 tablets on 07/30/2019. Patient says she still has a few left. Patient says she takes 2 pills "most days" but then started taking one pill daily to stretch out supply. Patient not compliant with follow up. Question some cognitive impairment. Patient has lost 17 lbs intentionally since 02/2019. No insurance right now, being added to husband's plan in 2021. I think we will be better able to pick a medication then when she is insured. She will continue weight loss and glimepiride 8 mg daily. F/u 6 weeks.   - POCT HgB A1C - glimepiride (AMARYL) 4 MG tablet; Take 2 tablets (8 mg total) by mouth daily with breakfast.  Dispense: 60 tablet; Refill: 2  2. Hypertension, unspecified type  Uncontrolled. Will add amlodipine to Lisinopril and instructed how to increase.  - lisinopril (ZESTRIL) 40 MG tablet; Take 1 tablet (40 mg total) by mouth daily.  Dispense: 90 tablet; Refill: 1 - amLODipine (NORVASC) 5 MG tablet; For one week, take one tablet or 5 mg daily. On week two, take two tablets or 10 mg daily.  Dispense: 90 tablet; Refill: 0  3. Injury of head, initial encounter  Bilateral periorbital ecchymoses. Patient does not want to go to ER. Will order scan as below. Counseled patient it won't be scheduled until tomorrow. Advised to go to ER if worsening. I will send her in small course of narcotic pain  medication.   - CT HEAD W & WO CONTRAST; Future  4. Hyperlipidemia, unspecified hyperlipidemia type  Will start statin in 2021. She will be added to her husband's insurance place in 2021 and I think we will have better affordability options then.       Trey Sailors, PA-C  [facility]

## 2019-09-23 ENCOUNTER — Telehealth: Payer: Self-pay

## 2019-09-23 ENCOUNTER — Telehealth: Payer: Self-pay | Admitting: Physician Assistant

## 2019-09-23 ENCOUNTER — Telehealth: Payer: Self-pay | Admitting: *Deleted

## 2019-09-23 ENCOUNTER — Ambulatory Visit
Admission: RE | Admit: 2019-09-23 | Discharge: 2019-09-23 | Disposition: A | Discharge status: Home / Self Care | Payer: Self-pay | Source: Ambulatory Visit | Attending: Physician Assistant | Admitting: Physician Assistant

## 2019-09-23 DIAGNOSIS — S022XXA Fracture of nasal bones, initial encounter for closed fracture: Secondary | ICD-10-CM

## 2019-09-23 DIAGNOSIS — S0990XA Unspecified injury of head, initial encounter: Secondary | ICD-10-CM | POA: Insufficient documentation

## 2019-09-23 MED ORDER — HYDROCODONE-ACETAMINOPHEN 5-325 MG PO TABS: 1.0000 | ORAL_TABLET | Freq: Three times a day (TID) | ORAL | 0 refills | Status: AC

## 2019-09-23 NOTE — Telephone Encounter (Signed)
Source Subject Topic  [name], Samantha (Patient) [name], Samantha (Patient) Quick Communication - See Telephone Encounter  Summary: Pt would like something called in for pain  CRM for notification. See Telephone encounter for: 09/23/19. Pt would like something for body pain she has had since her fall She uses [facility] [place] [facility] [place] [phone] (Phone)  [phone] (Fax

## 2019-09-23 NOTE — Addendum Note (Signed)
Addended by: Trinna Post on: 09/23/2019 09:56 AM   Modules accepted: Orders

## 2019-09-23 NOTE — Telephone Encounter (Signed)
-----   Message from Trinna Post, [place] sent at 09/23/2019  4:20 PM EST ----- I have called patient personally and explained results. She thinks she had this several months ago when she was dizzy. She would like to do an ENT referral for nasal fracture and defer neurology referral for next year to further work up stroke symptoms.

## 2019-09-23 NOTE — Telephone Encounter (Signed)
Please advise message below  °

## 2019-09-23 NOTE — Telephone Encounter (Signed)
Referred to ENT

## 2019-09-23 NOTE — Addendum Note (Signed)
Addended by: Trinna Post on: 09/23/2019 09:08 AM   Modules accepted: Orders

## 2019-09-23 NOTE — Telephone Encounter (Signed)
Sent rx into [facility].

## 2019-09-23 NOTE — Telephone Encounter (Signed)
Patient was advised of her referral to the ENT doctor.

## 2019-09-29 ENCOUNTER — Telehealth: Payer: Self-pay | Admitting: Physician Assistant

## 2019-09-29 NOTE — Telephone Encounter (Signed)
Just FYI, no action necessary.    Had a conversation on 09/23/2019 with patient about her results. She had presented to the clinic on 09/22/2019 after a fall onto concrete on 09/14/2019. She denied being seen in the ER and wanted to pursue imaging as an outpatient. Historically, patient has severely uncontrolled diabetes and HTN, with A1c of 14 and Bps in 210's/120s. Patient was non-compliant with medication and follow up. Patient's head ct on 09/23/2019 showed questionable recent or acute infarct as well as nasal bone fracture, arterial vascular calcification and paranasal sinus disease. Discussed with patients options. She may go to the ER for further imaging and potential workup, however due to her risk factors this may not even be an acute stroke. She reports that she felt poorly several months ago and had some dizziness and felt like it may have happened somewhere around then. Patient would like to be referred to ENT for the nasal bone fracture but defer stroke workup, including vascular imaging and referral to neurology, until the new year when she is added to her husband's insurance.

## 2019-11-04 ENCOUNTER — Ambulatory Visit: Payer: Self-pay | Admitting: Physician Assistant

## 2019-11-17 NOTE — Progress Notes (Signed)
Patient: Samantha [name] Female    DOB: 1956/07/07   64 y.o.   MRN: [id number] Visit Date: 11/18/2019  Today's Provider: Trinna Post, PA-C   Chief Complaint  Patient presents with  . Diabetes Mellitus   Subjective:     HPI    Diabetes Mellitus Type II, Follow-up:   Lab Results  Component Value Date   HGBA1C 11.1 (A) 09/22/2019   HGBA1C 10.8 (A) 03/17/2019   HGBA1C 14.1 (H) 10/10/2018    Last seen for diabetes 2 months ago.  Management since then includes adding amlodipine 10 mg daily + Lisinopril 40 mg daily. She started one pill on amlodipine 5 mg daily  She reports good compliance with treatment. She is not having side effects.  Current symptoms include none and have been stable. Home blood sugar records: not checked  Episodes of hypoglycemia? no   Current insulin regiment: Is not on insulin Most Recent Eye Exam: not UTD, [facility] scheduled Weight trend: stable Prior visit with dietician: No Current exercise: housecleaning and walking Current diet habits: well balanced  Pertinent Labs:    Component Value Date/Time   CHOL 229 (H) 10/10/2018 1518   TRIG 91 10/10/2018 1518   HDL 68 10/10/2018 1518   LDLCALC 143 (H) 10/10/2018 1518   CREATININE 0.79 10/10/2018 1518    Wt Readings from Last 3 Encounters:  11/18/19 283 lb (128.4 kg)  09/22/19 279 lb 9.6 oz (126.8 kg)  03/17/19 296 lb 3.2 oz (134.4 kg)   BP Readings from Last 8 Encounters:  11/18/19 (!) 191/117  09/22/19 (!) 176/111  03/17/19 (!) 206/118  10/10/18 (!) 186/134     ------------------------------------------------------------------------   No Known Allergies   Current Outpatient Medications:  .  amLODipine (NORVASC) 5 MG tablet, For one week, take one tablet or 5 mg daily. On week two, take two tablets or 10 mg daily., Disp: 90 tablet, Rfl: 0 .  lisinopril (ZESTRIL) 40 MG tablet, Take 1 tablet (40 mg total) by mouth daily., Disp: 90 tablet, Rfl: 1 .  glimepiride (AMARYL) 4 MG  tablet, Take 2 tablets (8 mg total) by mouth daily with breakfast., Disp: 60 tablet, Rfl: 2  Review of Systems  Constitutional: Negative.   Respiratory: Negative.   Cardiovascular: Negative.   Gastrointestinal: Negative.   Genitourinary: Negative.   Neurological: Negative.     Social History   Tobacco Use  . Smoking status: Never Smoker  . Smokeless tobacco: Never Used  Substance Use Topics  . Alcohol use: Never      Objective:   BP (!) 191/117 (BP Location: Left Arm, Patient Position: Sitting, Cuff Size: Normal)   Pulse (!) 107   Temp (!) 96.9 F (36.1 C) (Temporal)   Wt 283 lb (128.4 kg)   BMI 45.68 kg/m  Vitals:   11/18/19 1536  BP: (!) 191/117  Pulse: (!) 107  Temp: (!) 96.9 F (36.1 C)  TempSrc: Temporal  Weight: 283 lb (128.4 kg)  Body mass index is 45.68 kg/m.   Physical Exam Constitutional:      Appearance: Normal appearance.  Cardiovascular:     Rate and Rhythm: Normal rate and regular rhythm.     Heart sounds: Normal heart sounds.  Pulmonary:     Effort: Pulmonary effort is normal.     Breath sounds: Normal breath sounds.  Skin:    General: Skin is warm and dry.  Neurological:     Mental Status: She is alert and oriented to  person, place, and time. Mental status is at baseline.  Psychiatric:        Mood and Affect: Mood normal.        Behavior: Behavior normal.      No results found for any visits on 11/18/19.     Assessment & Plan    1. Hypertension, unspecified type  Increase amlodipine, will likely need additional medication. She wants to increase the amlodipine before adding another medication. She will also need cholesterol medication in the future.   - amLODipine (NORVASC) 10 MG tablet; Take 1 tablet (10 mg total) by mouth daily.  Dispense: 90 tablet; Refill: 0 - Comprehensive Metabolic Panel (CMET) - CBC with Differential - Lipid Profile - TSH - HIV antibody (with reflex)  2. Type 2 diabetes mellitus without complication,  without long-term current use of insulin (HCC)  She declines injectable, Counseled she may need in future to control sugar.   - POCT HgB A1C - Comprehensive Metabolic Panel (CMET) - CBC with Differential - Lipid Profile - TSH - HIV antibody (with reflex) - Ambulatory referral to [facility] - dapagliflozin propanediol (FARXIGA) 10 MG TABS tablet; Take 10 mg by mouth daily before breakfast.  Dispense: 90 tablet; Refill: 1  3. Closed fracture of nasal bone, sequela  - Ambulatory referral to ENT  The entirety of the information documented in the History of Present Illness, Review of Systems and Physical Exam were personally obtained by me. Portions of this information were initially documented by [facility] and reviewed by me for thoroughness and accuracy.      Trey Sailors, PA-C  [facility]

## 2019-11-18 ENCOUNTER — Other Ambulatory Visit: Payer: Self-pay

## 2019-11-18 ENCOUNTER — Encounter: Payer: Self-pay | Admitting: Physician Assistant

## 2019-11-18 ENCOUNTER — Ambulatory Visit: Payer: 59 | Admitting: Physician Assistant

## 2019-11-18 VITALS — BP 191/117 | HR 107 | Temp 96.9°F | Wt 283.0 lb

## 2019-11-18 DIAGNOSIS — I10 Essential (primary) hypertension: Secondary | ICD-10-CM | POA: Diagnosis not present

## 2019-11-18 DIAGNOSIS — E119 Type 2 diabetes mellitus without complications: Secondary | ICD-10-CM

## 2019-11-18 DIAGNOSIS — S022XXS Fracture of nasal bones, sequela: Secondary | ICD-10-CM

## 2019-11-18 LAB — POCT GLYCOSYLATED HEMOGLOBIN (HGB A1C)
Est. average glucose Bld gHb Est-mCnc: 223
Hemoglobin A1C: 9.4 % — AB (ref 4.0–5.6)

## 2019-11-18 MED ORDER — AMLODIPINE BESYLATE 10 MG PO TABS: 10.0000 mg | ORAL_TABLET | Freq: Every day | ORAL | 0 refills | Status: DC

## 2019-11-18 MED ORDER — FARXIGA 10 MG PO TABS: 10.0000 mg | ORAL_TABLET | Freq: Every day | ORAL | 1 refills | Status: DC

## 2019-11-18 NOTE — Patient Instructions (Signed)
Diabetes Mellitus and Exercise Exercising regularly is important for your overall health, especially when you have diabetes (diabetes mellitus). Exercising is not only about losing weight. It has many other health benefits, such as increasing muscle strength and bone density and reducing body fat and stress. This leads to improved fitness, flexibility, and endurance, all of which result in better overall health. Exercise has additional benefits for people with diabetes, including:  Reducing appetite.  Helping to lower and control blood glucose.  Lowering blood pressure.  Helping to control amounts of fatty substances (lipids) in the blood, such as cholesterol and triglycerides.  Helping the body to respond better to insulin (improving insulin sensitivity).  Reducing how much insulin the body needs.  Decreasing the risk for heart disease by: ? Lowering cholesterol and triglyceride levels. ? Increasing the levels of good cholesterol. ? Lowering blood glucose levels. What is my activity plan? Your health care provider or certified diabetes educator can help you make a plan for the type and frequency of exercise (activity plan) that works for you. Make sure that you:  Do at least 150 minutes of moderate-intensity or vigorous-intensity exercise each week. This could be brisk walking, biking, or water aerobics. ? Do stretching and strength exercises, such as yoga or weightlifting, at least 2 times a week. ? Spread out your activity over at least 3 days of the week.  Get some form of physical activity every day. ? Do not go more than 2 days in a row without some kind of physical activity. ? Avoid being inactive for more than 30 minutes at a time. Take frequent breaks to walk or stretch.  Choose a type of exercise or activity that you enjoy, and set realistic goals.  Start slowly, and gradually increase the intensity of your exercise over time. What do I need to know about managing my  diabetes?   Check your blood glucose before and after exercising. ? If your blood glucose is 240 mg/dL (13.3 mmol/L) or higher before you exercise, check your urine for ketones. If you have ketones in your urine, do not exercise until your blood glucose returns to normal. ? If your blood glucose is 100 mg/dL (5.6 mmol/L) or lower, eat a snack containing 15-20 grams of carbohydrate. Check your blood glucose 15 minutes after the snack to make sure that your level is above 100 mg/dL (5.6 mmol/L) before you start your exercise.  Know the symptoms of low blood glucose (hypoglycemia) and how to treat it. Your risk for hypoglycemia increases during and after exercise. Common symptoms of hypoglycemia can include: ? Hunger. ? Anxiety. ? Sweating and feeling clammy. ? Confusion. ? Dizziness or feeling light-headed. ? Increased heart rate or palpitations. ? Blurry vision. ? Tingling or numbness around the mouth, lips, or tongue. ? Tremors or shakes. ? Irritability.  Keep a rapid-acting carbohydrate snack available before, during, and after exercise to help prevent or treat hypoglycemia.  Avoid injecting insulin into areas of the body that are going to be exercised. For example, avoid injecting insulin into: ? The arms, when playing tennis. ? The legs, when jogging.  Keep records of your exercise habits. Doing this can help you and your health care provider adjust your diabetes management plan as needed. Write down: ? Food that you eat before and after you exercise. ? Blood glucose levels before and after you exercise. ? The type and amount of exercise you have done. ? When your insulin is expected to peak, if you use   insulin. Avoid exercising at times when your insulin is peaking.  When you start a new exercise or activity, work with your health care provider to make sure the activity is safe for you, and to adjust your insulin, medicines, or food intake as needed.  Drink plenty of water while  you exercise to prevent dehydration or heat stroke. Drink enough fluid to keep your urine clear or pale yellow. Summary  Exercising regularly is important for your overall health, especially when you have diabetes (diabetes mellitus).  Exercising has many health benefits, such as increasing muscle strength and bone density and reducing body fat and stress.  Your health care provider or certified diabetes educator can help you make a plan for the type and frequency of exercise (activity plan) that works for you.  When you start a new exercise or activity, work with your health care provider to make sure the activity is safe for you, and to adjust your insulin, medicines, or food intake as needed. This information is not intended to replace advice given to you by your health care provider. Make sure you discuss any questions you have with your health care provider. Document Revised: 05/09/2017 Document Reviewed: 03/26/2016 Elsevier Patient Education  2020 Elsevier Inc.  

## 2019-11-19 LAB — COMPREHENSIVE METABOLIC PANEL
ALT: 10 IU/L (ref 0–32)
AST: 16 IU/L (ref 0–40)
Albumin/Globulin Ratio: 1.3 (ref 1.2–2.2)
Albumin: 4 g/dL (ref 3.8–4.8)
Alkaline Phosphatase: 86 IU/L (ref 39–117)
BUN/Creatinine Ratio: 15 (ref 12–28)
BUN: 13 mg/dL (ref 8–27)
Bilirubin Total: 0.3 mg/dL (ref 0.0–1.2)
CO2: 21 mmol/L (ref 20–29)
Calcium: 9.6 mg/dL (ref 8.7–10.3)
Chloride: 101 mmol/L (ref 96–106)
Creatinine, Ser: 0.86 mg/dL (ref 0.57–1.00)
GFR calc Af Amer: 84 mL/min/{1.73_m2} (ref >59)
GFR calc non Af Amer: 73 mL/min/{1.73_m2} (ref >59)
Globulin, Total: 3.2 g/dL (ref 1.5–4.5)
Glucose: 233 mg/dL — ABNORMAL HIGH (ref 65–99)
Potassium: 4.3 mmol/L (ref 3.5–5.2)
Sodium: 138 mmol/L (ref 134–144)
Total Protein: 7.2 g/dL (ref 6.0–8.5)

## 2019-11-19 LAB — CBC WITH DIFFERENTIAL/PLATELET
Basophils Absolute: 0 10*3/uL (ref 0.0–0.2)
Basos: 1 %
EOS (ABSOLUTE): 0.4 10*3/uL (ref 0.0–0.4)
Eos: 6 %
Hematocrit: 40 % (ref 34.0–46.6)
Hemoglobin: 13.2 g/dL (ref 11.1–15.9)
Immature Grans (Abs): 0 10*3/uL (ref 0.0–0.1)
Immature Granulocytes: 0 %
Lymphocytes Absolute: 2.2 10*3/uL (ref 0.7–3.1)
Lymphs: 29 %
MCH: 32 pg (ref 26.6–33.0)
MCHC: 33 g/dL (ref 31.5–35.7)
MCV: 97 fL (ref 79–97)
Monocytes Absolute: 0.5 10*3/uL (ref 0.1–0.9)
Monocytes: 7 %
Neutrophils Absolute: 4.3 10*3/uL (ref 1.4–7.0)
Neutrophils: 57 %
Platelets: 199 10*3/uL (ref 150–450)
RBC: 4.13 x10E6/uL (ref 3.77–5.28)
RDW: 11.8 % (ref 11.7–15.4)
WBC: 7.4 10*3/uL (ref 3.4–10.8)

## 2019-11-19 LAB — LIPID PANEL
Chol/HDL Ratio: 3.7 ratio (ref 0.0–4.4)
Cholesterol, Total: 234 mg/dL — ABNORMAL HIGH (ref 100–199)
HDL: 64 mg/dL (ref >39)
LDL Chol Calc (NIH): 153 mg/dL — ABNORMAL HIGH (ref 0–99)
Triglycerides: 96 mg/dL (ref 0–149)
VLDL Cholesterol Cal: 17 mg/dL (ref 5–40)

## 2019-11-19 LAB — TSH: TSH: 1.59 u[IU]/mL (ref 0.450–4.500)

## 2019-11-19 LAB — HIV ANTIBODY (ROUTINE TESTING W REFLEX): HIV Screen 4th Generation wRfx: NONREACTIVE

## 2019-11-20 ENCOUNTER — Telehealth: Payer: Self-pay | Admitting: Physician Assistant

## 2019-11-20 NOTE — Chronic Care Management (AMB) (Signed)
  Care Management   Note  11/20/2019 Name: Samantha [name] MRN: [id number] DOB: 03-21-1956  [name] is a 64 y.o. year old female who is a primary care patient of Maryella Shivers. I reached out to Norely Laidler by phone today in response to a referral sent by [name]'s health plan.    [name] was given information about care management services today including:  1. Care management services include personalized support from designated clinical staff supervised by her physician, including individualized plan of care and coordination with other care providers 2. 24/7 contact phone numbers for assistance for urgent and routine care needs. 3. The patient may stop care management services at any time by phone call to the office staff.  Patient agreed to services and verbal consent obtained.   Follow up plan: Telephone appointment with care management team member scheduled for:11/26/2019  Elisha Ponder, LPN Health Advisor, Embedded Care Coordination [facility] Health Care Management ??[email or web address] ??[phone]

## 2019-11-26 ENCOUNTER — Ambulatory Visit: Payer: Self-pay

## 2019-11-26 ENCOUNTER — Telehealth: Payer: 59

## 2019-11-26 NOTE — Chronic Care Management (AMB) (Signed)
  Chronic Care Management   Outreach Note  11/26/2019 Name: Samantha [name] MRN: [id number] DOB: June 10, 1956  Referred by: Trey Sailors, PA-C Reason for referral : Chronic Care Management   An unsuccessful telephone outreach was attempted today. [name] was referred to the case management team for assistance with care management and care coordination.   A HIPAA compliant voice message was left today requesting a return call.   Follow Up Plan: The care management team will reach out to [name] again within the next two weeks.    Katha Cabal [facility] Care Management [phone]

## 2019-12-10 ENCOUNTER — Telehealth: Payer: 59

## 2019-12-24 ENCOUNTER — Telehealth: Payer: 59

## 2019-12-24 ENCOUNTER — Ambulatory Visit: Payer: Self-pay

## 2019-12-24 NOTE — Chronic Care Management (AMB) (Signed)
  Chronic Care Management   Outreach Note  12/24/2019 Name: Hlee Fringer MRN: [id number] DOB: Jul 27, 1956    Primary Care Provider: Trey Sailors, PA-C Reason for referral : Chronic Care Management    Third unsuccessful telephone outreach was attempted today. Ms. Valentine was referred to the care management team for care coordination. Her primary care provider will be notified of our unsuccessful attempts to establish and maintain contact. The care management team will gladly outreach at any time in the future if she is interested in receiving assistance.   Follow Up Plan:  The care management team will gladly follow up with Ms. Mabey after the primary care provider has a conversation with her regarding recommendation for care management engagement and subsequent re-referral for care management services.   Katha Cabal [facility] Care Management [phone]

## 2020-01-02 ENCOUNTER — Other Ambulatory Visit: Payer: Self-pay | Admitting: Physician Assistant

## 2020-01-02 DIAGNOSIS — E119 Type 2 diabetes mellitus without complications: Secondary | ICD-10-CM

## 2020-01-02 NOTE — Telephone Encounter (Signed)
Requested medication (s) are due for refill today: yes  Requested medication (s) are on the active medication list: yes  Last refill:  09/22/19  Future visit scheduled: yes  Notes to clinic:  prescription expired 10/22/19   Requested Prescriptions  Pending Prescriptions Disp Refills   glimepiride (AMARYL) 4 MG tablet [Pharmacy Med Name: Glimepiride 4 MG Oral Tablet] 60 tablet 0    Sig: TAKE 2 TABLETS BY MOUTH ONCE DAILY WITH BREAKFAST      Endocrinology:  Diabetes - Sulfonylureas Failed - 01/02/2020  9:11 AM      Failed - HBA1C is between 0 and 7.9 and within 180 days    Hemoglobin A1C  Date Value Ref Range Status  11/18/2019 9.4 (A) 4.0 - 5.6 % Final   Hgb A1c MFr Bld  Date Value Ref Range Status  10/10/2018 14.1 (H) 4.8 - 5.6 % Final    Comment:             Prediabetes: 5.7 - 6.4          Diabetes: >6.4          Glycemic control for adults with diabetes: <7.0           Passed - Valid encounter within last 6 months    Recent Outpatient Visits           1 month ago Hypertension, unspecified type   [facility] [place], Adriana M, PA-C   3 months ago Type 2 diabetes mellitus without complication, without long-term current use of insulin [facility])   [facility] [place], Adriana M, PA-C   9 months ago Type 2 diabetes mellitus without complication, without long-term current use of insulin [facility])   [facility] [place], [place], [place]   1 year ago Type 2 diabetes mellitus without complication, without long-term current use of insulin [facility])   [facility] [place], [place], [place]       Future Appointments             In 1 month Jodi Marble, [place], PA-C [facility]

## 2020-01-04 ENCOUNTER — Other Ambulatory Visit: Payer: Self-pay | Admitting: Physician Assistant

## 2020-01-04 DIAGNOSIS — I10 Essential (primary) hypertension: Secondary | ICD-10-CM

## 2020-01-04 NOTE — Telephone Encounter (Signed)
[name] was on 11/18/2019 and upcoming visit on 02/16/2020.

## 2020-01-08 ENCOUNTER — Other Ambulatory Visit: Payer: Self-pay | Admitting: Physician Assistant

## 2020-01-08 DIAGNOSIS — I10 Essential (primary) hypertension: Secondary | ICD-10-CM

## 2020-01-30 ENCOUNTER — Other Ambulatory Visit: Payer: Self-pay | Admitting: Physician Assistant

## 2020-01-30 DIAGNOSIS — E119 Type 2 diabetes mellitus without complications: Secondary | ICD-10-CM

## 2020-01-30 NOTE — Telephone Encounter (Signed)
Requested Prescriptions  Pending Prescriptions Disp Refills  . glimepiride (AMARYL) 4 MG tablet [Pharmacy Med Name: Glimepiride 4 MG Oral Tablet] 60 tablet 0    Sig: TAKE 2 TABLETS BY MOUTH ONCE DAILY WITH BREAKFAST     Endocrinology:  Diabetes - Sulfonylureas Failed - 01/30/2020  1:30 PM      Failed - HBA1C is between 0 and 7.9 and within 180 days    Hemoglobin A1C  Date Value Ref Range Status  11/18/2019 9.4 (A) 4.0 - 5.6 % Final   Hgb A1c MFr Bld  Date Value Ref Range Status  10/10/2018 14.1 (H) 4.8 - 5.6 % Final    Comment:             Prediabetes: 5.7 - 6.4          Diabetes: >6.4          Glycemic control for adults with diabetes: <7.0          Passed - Valid encounter within last 6 months    Recent Outpatient Visits          2 months ago Hypertension, unspecified type   [facility] [place], Adriana M, PA-C   4 months ago Type 2 diabetes mellitus without complication, without long-term current use of insulin [facility])   [facility] [place], Adriana M, PA-C   10 months ago Type 2 diabetes mellitus without complication, without long-term current use of insulin [facility])   [facility] [place], [place], [place]   1 year ago Type 2 diabetes mellitus without complication, without long-term current use of insulin [facility])   [facility] [place], [place], [place]      Future Appointments            In 2 weeks Trey Sailors, PA-C [facility]

## 2020-02-15 NOTE — Progress Notes (Deleted)
Established patient visit    Patient: Samantha [name]   DOB: 1956/06/19   64 y.o. Female  MRN: [id number] Visit Date: 02/15/2020  Today's healthcare provider: Trinna Post, PA-C   No chief complaint on file.  Subjective    HPI Diabetes Mellitus Type II, follow-up  Lab Results  Component Value Date   HGBA1C 9.4 (A) 11/18/2019   HGBA1C 11.1 (A) 09/22/2019   HGBA1C 10.8 (A) 03/17/2019   Last seen for diabetes 3 months ago.  Management since then includes continuing the same treatment. She reports {excellent/good/fair/poor:19665} compliance with treatment. She {is/is not:21021397} having side effects. {document side effects if present:1}  Home blood sugar records: {diabetes glucometry results:16657}  Episodes of hypoglycemia? {Yes/No:20286} {enter details if yes:1}   Current insulin regiment: {***Type 'None' if not taking insulin                                                otherwise enter complete                                                 details of insulin regiment:1} Most Recent Eye Exam: ***  ----------------------------------------------------------------------------------------- Hypertension, follow-up  BP Readings from Last 3 Encounters:  11/18/19 (!) 191/117  09/22/19 (!) 176/111  03/17/19 (!) 206/118   She was last seen for hypertension 3 months ago.  BP at that visit was 191/117. Management since that visit includes Amlodipine 10 MG tablet . She reports {excellent/good/fair/poor:19665} compliance with treatment. She {is/is not:9024} having side effects. {document side effects if present:1} She {is/is not:9024} exercising. She {is/is not:9024} adherent to low salt diet.   Outside blood pressures are {enter patient reported home BP, or 'not being checked':1}.  She {does/does not:200015} smoke.  Use of agents associated with hypertension: {bp agents assoc with hypertension:511::"none"}.    ----------------------------------------------------------------------------------------- Lipid/Cholesterol, follow-up  Last Lipid Panel: Lab Results  Component Value Date   CHOL 234 (H) 11/18/2019   LDLCALC 153 (H) 11/18/2019   HDL 64 11/18/2019   TRIG 96 11/18/2019   ALT 10 11/18/2019   AST 16 11/18/2019   PLT 199 11/18/2019    She was last seen for this {1-12:18279} {days/wks/mos/yrs:310907} ago.  Management since that visit includes ***.  She reports {excellent/good/fair/poor:19665} compliance with treatment. She {is/is not:9024} having side effects. {document side effects if present:1}  Symptoms: {Yes/No:20286} appetite changes {Yes/No:20286} foot ulcerations {Yes/No:20286} chest pain {Yes/No:20286} chest pressure/discomfort {Yes/No:20286} dyspnea {Yes/No:20286} fatigue {Yes/No:20286} lower extremity edema {Yes/No:20286} nausea {Yes/No:20286} numbness or tingling of extremity {Yes/No:20286} orthopnea {Yes/No:20286} palpitations {Yes/No:20286} paroxysmal nocturnal dyspnea {Yes/No:20286} polydipsia {Yes/No:20286} polyuria {Yes/No:20286} speech difficulty {Yes/No:20286} syncope {Yes/No:20286} visual disturbances   She is following a {diet:21022986} diet. Current exercise: {exercise types:16438}  Wt Readings from Last 3 Encounters:  11/18/19 283 lb (128.4 kg)  09/22/19 279 lb 9.6 oz (126.8 kg)  03/17/19 296 lb 3.2 oz (134.4 kg)   Last metabolic panel Lab Results  Component Value Date   GLUCOSE 233 (H) 11/18/2019   NA 138 11/18/2019   K 4.3 11/18/2019   CL 101 11/18/2019   CO2 21 11/18/2019   BUN 13 11/18/2019   CREATININE 0.86 11/18/2019   GFRNONAA 73 11/18/2019   GFRAA 84 11/18/2019   CALCIUM  9.6 11/18/2019   PROT 7.2 11/18/2019   ALBUMIN 4.0 11/18/2019   LABGLOB 3.2 11/18/2019   AGRATIO 1.3 11/18/2019   BILITOT 0.3 11/18/2019   ALKPHOS 86 11/18/2019   AST 16 11/18/2019   ALT 10 11/18/2019   The 10-year ASCVD risk score Denman George DC Jr., et al.,  2013) is: 46.4%  -----------------------------------------------------------------------------------------    {Show patient history (optional):23778::" "}   Medications: Outpatient Medications Prior to Visit  Medication Sig  . amLODipine (NORVASC) 10 MG tablet Take 1 tablet (10 mg total) by mouth daily.  . dapagliflozin propanediol (FARXIGA) 10 MG TABS tablet Take 10 mg by mouth daily before breakfast.  . glimepiride (AMARYL) 4 MG tablet TAKE 2 TABLETS BY MOUTH ONCE DAILY WITH BREAKFAST  . lisinopril (ZESTRIL) 40 MG tablet Take 1 tablet (40 mg total) by mouth daily.   No facility-administered medications prior to visit.    Review of Systems  Constitutional: Negative.   HENT: Negative.   Eyes: Negative.   Respiratory: Negative.   Cardiovascular: Negative.   Gastrointestinal: Negative.   Endocrine: Negative.   Genitourinary: Negative.   Musculoskeletal: Negative.   Skin: Negative.   Allergic/Immunologic: Negative.   Neurological: Negative.   Hematological: Negative.   Psychiatric/Behavioral: Negative.     {Show previous labs (optional):23779::" "}   Objective    There were no vitals taken for this visit. {Show previous vital signs (optional):23777::" "}  Physical Exam Constitutional:      Appearance: Normal appearance.  HENT:     Head: Normocephalic and atraumatic.  Cardiovascular:     Rate and Rhythm: Normal rate.  Pulmonary:     Effort: Pulmonary effort is normal.  Musculoskeletal:        General: Normal range of motion.     Cervical back: Normal range of motion.  Skin:    General: Skin is warm and dry.  Neurological:     Mental Status: She is alert and oriented to person, place, and time.  Psychiatric:        Mood and Affect: Mood normal.        Behavior: Behavior normal.     ***  No results found for any visits on 02/16/20.   Assessment & Plan    ***  No follow-ups on file.      {provider attestation***:1}   Maryella Shivers    [facility] [phone] (phone) [phone] (fax)  [facility]

## 2020-02-16 ENCOUNTER — Ambulatory Visit: Payer: Self-pay | Admitting: Physician Assistant

## 2020-02-18 ENCOUNTER — Ambulatory Visit: Payer: Self-pay | Attending: Internal Medicine

## 2020-02-18 DIAGNOSIS — Z23 Encounter for immunization: Secondary | ICD-10-CM

## 2020-02-18 NOTE — Progress Notes (Signed)
   Covid-19 Vaccination Clinic  Name:  Samantha [name]    MRN: [id number] DOB: 1955/11/13  02/18/2020  [name] was observed post Covid-19 immunization for 30 minutes based on pre-vaccination screening without incident. She was provided with Vaccine Information Sheet and instruction to access the V-Safe system.   [name] was instructed to call 911 with any severe reactions post vaccine: Marland Kitchen Difficulty breathing  . Swelling of face and throat  . A fast heartbeat  . A bad rash all over body  . Dizziness and weakness   Immunizations Administered    Name Date Dose VIS Date Route   Pfizer COVID-19 Vaccine 02/18/2020 10:25 AM 0.3 mL 12/23/2018 Intramuscular   Manufacturer: [employer], [employer]   Lot: [id number]   NDC: [address]-4

## 2020-03-05 ENCOUNTER — Other Ambulatory Visit: Payer: Self-pay | Admitting: Physician Assistant

## 2020-03-05 DIAGNOSIS — I10 Essential (primary) hypertension: Secondary | ICD-10-CM

## 2020-03-05 NOTE — Telephone Encounter (Signed)
Requested Prescriptions  Pending Prescriptions Disp Refills  . lisinopril (ZESTRIL) 40 MG tablet [Pharmacy Med Name: Lisinopril 40 MG Oral Tablet] 90 tablet 0    Sig: Take 1 tablet by mouth once daily     Cardiovascular:  ACE Inhibitors Failed - 03/05/2020  5:50 PM      Failed - Last BP in normal range    BP Readings from Last 1 Encounters:  11/18/19 (!) 191/117         Passed - Cr in normal range and within 180 days    Creatinine, Ser  Date Value Ref Range Status  11/18/2019 0.86 0.57 - 1.00 mg/dL Final         Passed - K in normal range and within 180 days    Potassium  Date Value Ref Range Status  11/18/2019 4.3 3.5 - 5.2 mmol/L Final         Passed - Patient is not pregnant      Passed - Valid encounter within last 6 months    Recent Outpatient Visits          3 months ago Hypertension, unspecified type   [facility] [place], Adriana M, PA-C   5 months ago Type 2 diabetes mellitus without complication, without long-term current use of insulin [facility])   [facility] [place], Ricki Rodriguez M, [place]   11 months ago Type 2 diabetes mellitus without complication, without long-term current use of insulin [facility])   [facility] [place], [place], [place]   1 year ago Type 2 diabetes mellitus without complication, without long-term current use of insulin [facility])   [facility] [place], [place], [place]

## 2020-03-16 ENCOUNTER — Ambulatory Visit: Payer: Self-pay

## 2020-03-19 ENCOUNTER — Other Ambulatory Visit: Payer: Self-pay | Admitting: Physician Assistant

## 2020-03-19 DIAGNOSIS — E119 Type 2 diabetes mellitus without complications: Secondary | ICD-10-CM

## 2020-03-19 NOTE — Telephone Encounter (Signed)
Requested Prescriptions  Pending Prescriptions Disp Refills  . glimepiride (AMARYL) 4 MG tablet [Pharmacy Med Name: Glimepiride 4 MG Oral Tablet] 180 tablet 0    Sig: TAKE 2 TABLETS BY MOUTH ONCE DAILY WITH BREAKFAST     Endocrinology:  Diabetes - Sulfonylureas Failed - 03/19/2020 11:48 AM      Failed - HBA1C is between 0 and 7.9 and within 180 days    Hemoglobin A1C  Date Value Ref Range Status  11/18/2019 9.4 (A) 4.0 - 5.6 % Final   Hgb A1c MFr Bld  Date Value Ref Range Status  10/10/2018 14.1 (H) 4.8 - 5.6 % Final    Comment:             Prediabetes: 5.7 - 6.4          Diabetes: >6.4          Glycemic control for adults with diabetes: <7.0          Passed - Valid encounter within last 6 months    Recent Outpatient Visits          4 months ago Hypertension, unspecified type   [facility] [place], Adriana M, PA-C   5 months ago Type 2 diabetes mellitus without complication, without long-term current use of insulin [facility])   [facility] [place], [place], [place]   1 year ago Type 2 diabetes mellitus without complication, without long-term current use of insulin [facility])   [facility] [place], [place], [place]   1 year ago Type 2 diabetes mellitus without complication, without long-term current use of insulin [facility])   [facility] [place], [place], [place]

## 2020-05-06 ENCOUNTER — Other Ambulatory Visit: Payer: Self-pay | Admitting: Physician Assistant

## 2020-05-06 DIAGNOSIS — I10 Essential (primary) hypertension: Secondary | ICD-10-CM

## 2020-07-11 ENCOUNTER — Other Ambulatory Visit: Payer: Self-pay | Admitting: Physician Assistant

## 2020-07-11 DIAGNOSIS — I10 Essential (primary) hypertension: Secondary | ICD-10-CM

## 2020-07-11 NOTE — Telephone Encounter (Signed)
Requested Prescriptions  Pending Prescriptions Disp Refills  . lisinopril (ZESTRIL) 40 MG tablet [Pharmacy Med Name: Lisinopril 40 MG Oral Tablet] 30 tablet 0    Sig: Take 1 tablet by mouth once daily     Cardiovascular:  ACE Inhibitors Failed - 07/11/2020  7:49 AM      Failed - Cr in normal range and within 180 days    Creatinine, Ser  Date Value Ref Range Status  11/18/2019 0.86 0.57 - 1.00 mg/dL Final         Failed - K in normal range and within 180 days    Potassium  Date Value Ref Range Status  11/18/2019 4.3 3.5 - 5.2 mmol/L Final         Failed - Last BP in normal range    BP Readings from Last 1 Encounters:  11/18/19 (!) 191/117         Failed - Valid encounter within last 6 months    Recent Outpatient Visits          7 months ago Hypertension, unspecified type   [facility] [place], Adriana M, PA-C   9 months ago Type 2 diabetes mellitus without complication, without long-term current use of insulin [facility])   [facility] [place], [place], PA-C   1 year ago Type 2 diabetes mellitus without complication, without long-term current use of insulin [facility])   [facility] [place], [place], [place]   1 year ago Type 2 diabetes mellitus without complication, without long-term current use of insulin [facility])   [facility] [place], [place], [place]             Passed - Patient is not pregnant      Courtesy refill given. Left message to call office for appointment

## 2020-07-12 ENCOUNTER — Other Ambulatory Visit: Payer: Self-pay | Admitting: Physician Assistant

## 2020-07-12 DIAGNOSIS — I10 Essential (primary) hypertension: Secondary | ICD-10-CM

## 2020-07-12 DIAGNOSIS — E119 Type 2 diabetes mellitus without complications: Secondary | ICD-10-CM

## 2020-07-12 NOTE — Telephone Encounter (Signed)
30 day courtesy RF Called pt and LM on VM to call office and make appt. Requested Prescriptions  Pending Prescriptions Disp Refills  . glimepiride (AMARYL) 4 MG tablet [Pharmacy Med Name: Glimepiride 4 MG Oral Tablet] 60 tablet 0    Sig: TAKE 2 TABLETS BY MOUTH ONCE DAILY WITH BREAKFAST     Endocrinology:  Diabetes - Sulfonylureas Failed - 07/12/2020 10:42 AM      Failed - HBA1C is between 0 and 7.9 and within 180 days    Hemoglobin A1C  Date Value Ref Range Status  11/18/2019 9.4 (A) 4.0 - 5.6 % Final   Hgb A1c MFr Bld  Date Value Ref Range Status  10/10/2018 14.1 (H) 4.8 - 5.6 % Final    Comment:             Prediabetes: 5.7 - 6.4          Diabetes: >6.4          Glycemic control for adults with diabetes: <7.0          Failed - Valid encounter within last 6 months    Recent Outpatient Visits          7 months ago Hypertension, unspecified type   [facility] [place], Adriana M, PA-C   9 months ago Type 2 diabetes mellitus without complication, without long-term current use of insulin [facility])   [facility] [place], [place], [place]   1 year ago Type 2 diabetes mellitus without complication, without long-term current use of insulin [facility])   [facility] [place], [place], [place]   1 year ago Type 2 diabetes mellitus without complication, without long-term current use of insulin [facility])   [facility] [place], [place]              '

## 2020-08-01 ENCOUNTER — Other Ambulatory Visit: Payer: Self-pay | Admitting: Physician Assistant

## 2020-08-01 DIAGNOSIS — I10 Essential (primary) hypertension: Secondary | ICD-10-CM

## 2020-08-01 NOTE — Telephone Encounter (Signed)
Requested Prescriptions  Pending Prescriptions Disp Refills   lisinopril (ZESTRIL) 40 MG tablet [Pharmacy Med Name: Lisinopril 40 MG Oral Tablet] 90 tablet 0    Sig: Take 1 tablet by mouth once daily     Cardiovascular:  ACE Inhibitors Failed - 08/01/2020 12:13 PM      Failed - Cr in normal range and within 180 days    Creatinine, Ser  Date Value Ref Range Status  11/18/2019 0.86 0.57 - 1.00 mg/dL Final         Failed - K in normal range and within 180 days    Potassium  Date Value Ref Range Status  11/18/2019 4.3 3.5 - 5.2 mmol/L Final         Failed - Last BP in normal range    BP Readings from Last 1 Encounters:  11/18/19 (!) 191/117         Failed - Valid encounter within last 6 months    Recent Outpatient Visits          8 months ago Hypertension, unspecified type   [facility] [place], Adriana M, PA-C   10 months ago Type 2 diabetes mellitus without complication, without long-term current use of insulin [facility])   [facility] [place], [place], PA-C   1 year ago Type 2 diabetes mellitus without complication, without long-term current use of insulin [facility])   [facility] [place], [place], [place]   1 year ago Type 2 diabetes mellitus without complication, without long-term current use of insulin [facility])   [facility] Trey Sailors, [place]      Future Appointments            In 4 days Trey Sailors, PA-C [facility], [facility]           Passed - Patient is not pregnant

## 2020-08-05 ENCOUNTER — Encounter: Payer: Self-pay | Admitting: Physician Assistant

## 2020-08-05 ENCOUNTER — Ambulatory Visit: Payer: 59 | Admitting: Physician Assistant

## 2020-08-05 ENCOUNTER — Other Ambulatory Visit: Payer: Self-pay

## 2020-08-05 VITALS — BP 192/119 | HR 106 | Temp 98.5°F | Wt 295.4 lb

## 2020-08-05 DIAGNOSIS — Z9114 Patient's other noncompliance with medication regimen: Secondary | ICD-10-CM | POA: Diagnosis not present

## 2020-08-05 DIAGNOSIS — Z91148 Patient's other noncompliance with medication regimen for other reason: Secondary | ICD-10-CM

## 2020-08-05 DIAGNOSIS — E785 Hyperlipidemia, unspecified: Secondary | ICD-10-CM

## 2020-08-05 DIAGNOSIS — Z1231 Encounter for screening mammogram for malignant neoplasm of breast: Secondary | ICD-10-CM

## 2020-08-05 DIAGNOSIS — I10 Essential (primary) hypertension: Secondary | ICD-10-CM | POA: Diagnosis not present

## 2020-08-05 DIAGNOSIS — E1169 Type 2 diabetes mellitus with other specified complication: Secondary | ICD-10-CM

## 2020-08-05 DIAGNOSIS — E119 Type 2 diabetes mellitus without complications: Secondary | ICD-10-CM | POA: Diagnosis not present

## 2020-08-05 MED ORDER — LISINOPRIL 40 MG PO TABS: 40.0000 mg | ORAL_TABLET | Freq: Every day | ORAL | 0 refills | Status: DC

## 2020-08-05 MED ORDER — AMLODIPINE BESYLATE 10 MG PO TABS: 10.0000 mg | ORAL_TABLET | Freq: Every day | ORAL | 0 refills | Status: DC

## 2020-08-05 MED ORDER — DAPAGLIFLOZIN PROPANEDIOL 10 MG PO TABS: 10.0000 mg | ORAL_TABLET | Freq: Every day | ORAL | 0 refills | Status: DC

## 2020-08-05 MED ORDER — GLIMEPIRIDE 4 MG PO TABS: ORAL_TABLET | ORAL | 0 refills | Status: DC

## 2020-08-05 MED ORDER — ATORVASTATIN CALCIUM 10 MG PO TABS: 10.0000 mg | ORAL_TABLET | Freq: Every day | ORAL | 0 refills | Status: DC

## 2020-08-05 NOTE — Progress Notes (Signed)
Established patient visit   Patient: Samantha [name]   DOB: 30-Jul-1956   64 y.o. Female  MRN: [id number] Visit Date: 08/05/2020  Today's healthcare provider: Trey Sailors, PA-C   Chief Complaint  Patient presents with  . Diabetes  . Hypertension   Subjective    HPI  Hypertension, follow-up  BP Readings from Last 3 Encounters:  08/05/20 (!) 192/119  11/18/19 (!) 191/117  09/22/19 (!) 176/111   Wt Readings from Last 3 Encounters:  08/05/20 295 lb 6.4 oz (134 kg)  11/18/19 283 lb (128.4 kg)  09/22/19 279 lb 9.6 oz (126.8 kg)     She was last seen for hypertension 9 months ago.  BP at that visit was 191/117. Management since that visit includes increased amlodipine to 10mg  daily.  She reports poor compliance with treatment. She is not having side effects.  She is following a Regular diet. She is not exercising. She does not smoke.  Use of agents associated with hypertension: none.   Outside blood pressures are checked occasionally. Symptoms: No chest pain No chest pressure  No palpitations No syncope  No dyspnea No orthopnea  No paroxysmal nocturnal dyspnea No lower extremity edema   Pertinent labs: Lab Results  Component Value Date   CHOL 234 (H) 11/18/2019   HDL 64 11/18/2019   LDLCALC 153 (H) 11/18/2019   TRIG 96 11/18/2019   CHOLHDL 3.7 11/18/2019   Lab Results  Component Value Date   NA 138 11/18/2019   K 4.3 11/18/2019   CREATININE 0.86 11/18/2019   GFRNONAA 73 11/18/2019   GFRAA 84 11/18/2019   GLUCOSE 233 (H) 11/18/2019     The 10-year ASCVD risk score 11/20/2019 DC Jr., et al., 2013) is: 47.9%   Diabetes Mellitus Type II, Follow-up  Lab Results  Component Value Date   HGBA1C 9.4 (A) 11/18/2019   HGBA1C 11.1 (A) 09/22/2019   HGBA1C 10.8 (A) 03/17/2019   Wt Readings from Last 3 Encounters:  08/05/20 295 lb 6.4 oz (134 kg)  11/18/19 283 lb (128.4 kg)  09/22/19 279 lb 9.6 oz (126.8 kg)   Last seen for diabetes 9 months ago.   Management since then includes no changes. Increase exercise and control diet.  She reports poor compliance with treatment. She is not having side effects.  Symptoms: No fatigue No foot ulcerations  No appetite changes No nausea  No paresthesia of the feet  No polydipsia  No polyuria No visual disturbances   No vomiting     Home blood sugar records: Fasting 200s  Episodes of hypoglycemia? No    Current insulin regiment: none Most Recent Eye Exam: due Current exercise: none Current diet habits: well balanced  Pertinent Labs: Lab Results  Component Value Date   CHOL 234 (H) 11/18/2019   HDL 64 11/18/2019   LDLCALC 153 (H) 11/18/2019   TRIG 96 11/18/2019   CHOLHDL 3.7 11/18/2019   Lab Results  Component Value Date   NA 138 11/18/2019   K 4.3 11/18/2019   CREATININE 0.86 11/18/2019   GFRNONAA 73 11/18/2019   GFRAA 84 11/18/2019   GLUCOSE 233 (H) 11/18/2019         Medications: Outpatient Medications Prior to Visit  Medication Sig  . amLODipine (NORVASC) 10 MG tablet Take 1 tablet (10 mg total) by mouth daily.  . dapagliflozin propanediol (FARXIGA) 10 MG TABS tablet Take 10 mg by mouth daily before breakfast.  . glimepiride (AMARYL) 4 MG tablet TAKE 2 TABLETS BY  MOUTH ONCE DAILY WITH BREAKFAST  . lisinopril (ZESTRIL) 40 MG tablet Take 1 tablet by mouth once daily   No facility-administered medications prior to visit.    Review of Systems  Constitutional: Negative.   Respiratory: Negative.   Cardiovascular: Negative.   Gastrointestinal: Negative.   Endocrine: Negative.   Genitourinary: Negative.   Musculoskeletal: Negative.   Neurological: Negative.       Objective    BP (!) 192/119   Pulse (!) 106   Temp 98.5 F (36.9 C)   Wt 295 lb 6.4 oz (134 kg)   BMI 47.68 kg/m    Physical Exam Constitutional:      Appearance: Normal appearance.  Cardiovascular:     Rate and Rhythm: Normal rate and regular rhythm.     Heart sounds: Normal heart sounds.   Pulmonary:     Effort: Pulmonary effort is normal.     Breath sounds: Normal breath sounds.  Skin:    General: Skin is warm and dry.  Neurological:     Mental Status: She is alert and oriented to person, place, and time. Mental status is at baseline.  Psychiatric:        Mood and Affect: Mood normal.        Behavior: Behavior normal.       No results found for any visits on 08/05/20.  Assessment & Plan     1. Type 2 diabetes mellitus without complication, without long-term current use of insulin (HCC)  Not compliant with treatment or follow up. Will add farxiga as below. Begin statin. She needs a pneumonia shot. She needs to return for CPE and chronic follow up in 1-3 months.  - POCT glycosylated hemoglobin (Hb A1C) - dapagliflozin propanediol (FARXIGA) 10 MG TABS tablet; Take 1 tablet (10 mg total) by mouth daily before breakfast.  Dispense: 90 tablet; Refill: 0 - glimepiride (AMARYL) 4 MG tablet; TAKE 2 TABLETS BY MOUTH ONCE DAILY WITH BREAKFAST  Dispense: 180 tablet; Refill: 0 - atorvastatin (LIPITOR) 10 MG tablet; Take 1 tablet (10 mg total) by mouth daily.  Dispense: 90 tablet; Refill: 0  2. Hypertension, unspecified type  Not compliant with treatment or follow up. Not taking her medications routinely. Needs to take these routinely.   - amLODipine (NORVASC) 10 MG tablet; Take 1 tablet (10 mg total) by mouth daily.  Dispense: 90 tablet; Refill: 0 - lisinopril (ZESTRIL) 40 MG tablet; Take 1 tablet (40 mg total) by mouth daily.  Dispense: 90 tablet; Refill: 0  3. Encounter for screening mammogram for malignant neoplasm of breast  - MM Digital Screening; Future  4. Noncompliance with medication regimen  5. Hyperlipidemia  Start statin.       ITrey Sailors, PA-C, have reviewed all documentation for this visit. The documentation on 08/11/20 for the exam, diagnosis, procedures, and orders are all accurate and complete.  The entirety of the information documented  in the History of Present Illness, Review of Systems and Physical Exam were personally obtained by me. Portions of this information were initially documented by Anson [place], CMA and reviewed by me for thoroughness and accuracy.     Maryella Shivers  [facility] [phone] (phone) [phone] (fax)  [facility]

## 2020-08-05 NOTE — Patient Instructions (Signed)
You are due for a diabetic eye exam - [facility] schedule [facility] at [facility].   You are also due for your flu vaccine and pneumonia vaccine. You can get this at the pharmacy. You are due for the PNEUMOVAX vaccine against pneumonia.  I have ordered your mammogram.

## 2020-08-11 DIAGNOSIS — E1169 Type 2 diabetes mellitus with other specified complication: Secondary | ICD-10-CM | POA: Insufficient documentation

## 2020-08-11 LAB — POCT GLYCOSYLATED HEMOGLOBIN (HGB A1C): Hemoglobin A1C: 9.4 % — AB (ref 4.0–5.6)

## 2020-08-23 ENCOUNTER — Other Ambulatory Visit: Payer: Self-pay | Admitting: Physician Assistant

## 2020-08-23 DIAGNOSIS — E119 Type 2 diabetes mellitus without complications: Secondary | ICD-10-CM

## 2020-08-23 NOTE — Telephone Encounter (Signed)
Requested Prescriptions  Pending Prescriptions Disp Refills   glimepiride (AMARYL) 4 MG tablet [Pharmacy Med Name: Glimepiride 4 MG Oral Tablet] 60 tablet 0    Sig: TAKE 2 TABLETS BY MOUTH ONCE DAILY WITH BREAKFAST.  NO FURTHER REFILL UNLESS PATIENT HAS FOLLOW UP     Endocrinology:  Diabetes - Sulfonylureas Failed - 08/23/2020 11:43 AM      Failed - HBA1C is between 0 and 7.9 and within 180 days    Hemoglobin A1C  Date Value Ref Range Status  08/11/2020 9.4 (A) 4.0 - 5.6 % Final   Hgb A1c MFr Bld  Date Value Ref Range Status  10/10/2018 14.1 (H) 4.8 - 5.6 % Final    Comment:             Prediabetes: 5.7 - 6.4          Diabetes: >6.4          Glycemic control for adults with diabetes: <7.0          Passed - Valid encounter within last 6 months    Recent Outpatient Visits          2 weeks ago Type 2 diabetes mellitus without complication, without long-term current use of insulin [facility])   [facility] [place], Ricki Rodriguez M, PA-C   9 months ago Hypertension, unspecified type   [facility] [place], Adriana M, PA-C   11 months ago Type 2 diabetes mellitus without complication, without long-term current use of insulin [facility])   [facility] [place], [place], [place]   1 year ago Type 2 diabetes mellitus without complication, without long-term current use of insulin [facility])   [facility] [place], [place], [place]   1 year ago Type 2 diabetes mellitus without complication, without long-term current use of insulin [facility])   [facility] [place], [place], [place]

## 2020-09-27 ENCOUNTER — Other Ambulatory Visit: Payer: Self-pay | Admitting: Physician Assistant

## 2020-09-27 DIAGNOSIS — I10 Essential (primary) hypertension: Secondary | ICD-10-CM

## 2020-09-30 ENCOUNTER — Telehealth: Payer: Self-pay

## 2020-09-30 NOTE — Telephone Encounter (Signed)
Amlodipine is generic at this point. At [facility] a 90 day supply will cost 24$ cash. At [facility] a three month supply will cost 12$ with a goodrx coupon which is free to everybody. Did she want me to send it to either of those?

## 2020-09-30 NOTE — Telephone Encounter (Signed)
Copied from CRM [id number]. Topic: General - Other >> Sep 30, 2020  2:31 PM Dalphine Handing A wrote: Patient called to inform Jodi Marble that the amLODipine (NORVASC) 10 MG tablet medication is too expensive for patient and wants to know if there is generic brand or alternative that her insurance will cover.  Patient hasnt been able to take medication due to costs.

## 2020-10-03 NOTE — Telephone Encounter (Signed)
Called patient and no answer left message for patient to return call. If patient calls back okay for [facility] to advise.

## 2020-10-05 NOTE — Telephone Encounter (Signed)
Called patient and no answer. Will try again later. Ok for [facility] to advise as below.

## 2020-10-06 NOTE — Telephone Encounter (Signed)
Attempted to contact pt.  Left vm to return call to office to discuss Rx options. Routing message back to the office, as 3 attempts have been made to contact pt., and were unsuccessful.

## 2020-10-10 ENCOUNTER — Other Ambulatory Visit: Payer: Self-pay | Admitting: Nurse Practitioner

## 2020-10-10 DIAGNOSIS — E119 Type 2 diabetes mellitus without complications: Secondary | ICD-10-CM

## 2020-10-10 NOTE — Telephone Encounter (Signed)
Requested medication (s) are due for refill today:  Yes  Requested medication (s) are on the active medication list:  Yes  Future visit scheduled:  No  Last Refill: 08/23/20; # 60; no refills  Notes to Clinic:  Attempted to contact pt.  Left vm to call and schedule appt. For follow-up. (was advised in Oct. To sched. CPE in 1-3 mos.)  Has already rec'd a one month courtesy refill.   Requested Prescriptions  Pending Prescriptions Disp Refills   glimepiride (AMARYL) 4 MG tablet [Pharmacy Med Name: Glimepiride 4 MG Oral Tablet] 60 tablet 0    Sig: TAKE 2 TABLETS BY MOUTH ONCE DAILY WITH BREAKFAST . APPOINTMENT REQUIRED FOR FUTURE REFILLS      Endocrinology:  Diabetes - Sulfonylureas Failed - 10/10/2020 10:32 AM      Failed - HBA1C is between 0 and 7.9 and within 180 days    Hemoglobin A1C  Date Value Ref Range Status  08/11/2020 9.4 (A) 4.0 - 5.6 % Final   Hgb A1c MFr Bld  Date Value Ref Range Status  10/10/2018 14.1 (H) 4.8 - 5.6 % Final    Comment:             Prediabetes: 5.7 - 6.4          Diabetes: >6.4          Glycemic control for adults with diabetes: <7.0           Passed - Valid encounter within last 6 months    Recent Outpatient Visits           2 months ago Type 2 diabetes mellitus without complication, without long-term current use of insulin [facility])   [facility] [place], Adriana M, PA-C   10 months ago Hypertension, unspecified type   [facility] [place], [place], PA-C   1 year ago Type 2 diabetes mellitus without complication, without long-term current use of insulin [facility])   [facility] [place], [place], [place]   1 year ago Type 2 diabetes mellitus without complication, without long-term current use of insulin [facility])   [facility] [place], [place], [place]   2 years ago Type 2 diabetes mellitus without complication, without long-term current use of insulin [facility])   [facility] [place], [place], [place]

## 2020-11-08 ENCOUNTER — Other Ambulatory Visit: Payer: Self-pay | Admitting: Physician Assistant

## 2020-11-08 DIAGNOSIS — E119 Type 2 diabetes mellitus without complications: Secondary | ICD-10-CM

## 2020-11-09 ENCOUNTER — Other Ambulatory Visit: Payer: Self-pay | Admitting: Physician Assistant

## 2020-11-09 DIAGNOSIS — E119 Type 2 diabetes mellitus without complications: Secondary | ICD-10-CM

## 2020-11-10 ENCOUNTER — Other Ambulatory Visit: Payer: Self-pay | Admitting: Physician Assistant

## 2020-11-10 DIAGNOSIS — E119 Type 2 diabetes mellitus without complications: Secondary | ICD-10-CM

## 2020-11-26 ENCOUNTER — Other Ambulatory Visit: Payer: Self-pay | Admitting: Physician Assistant

## 2020-11-26 DIAGNOSIS — E119 Type 2 diabetes mellitus without complications: Secondary | ICD-10-CM

## 2020-11-27 NOTE — Telephone Encounter (Signed)
Requested medications are due for refill today.  yes  Requested medications are on the active medications list.  yes  Last refill. 11/10/2020  Future visit scheduled.   no  Notes to clinic.  30 day courtesy refill already given - Pt needs appt.

## 2020-11-28 NOTE — Telephone Encounter (Signed)
Tried to contact patient.  Patient need a diabetes follow up. A courtesy refill was given to patient 01/13/2022P

## 2020-11-28 NOTE — Telephone Encounter (Signed)
No refills, she is not compliant with follow up. If she schedules an appointment I will fill in exactly the number of pills to get her there.

## 2020-11-29 NOTE — Telephone Encounter (Signed)
Tried calling patient and automatic voice states number not in service.

## 2020-12-05 ENCOUNTER — Other Ambulatory Visit: Payer: Self-pay | Admitting: Physician Assistant

## 2020-12-05 DIAGNOSIS — I10 Essential (primary) hypertension: Secondary | ICD-10-CM

## 2020-12-07 ENCOUNTER — Telehealth: Payer: Self-pay | Admitting: Physician Assistant

## 2020-12-07 NOTE — Telephone Encounter (Signed)
Medication Refill - Medication: Glimperide  Has the patient contacted their pharmacy? Yes.   (Agent: If no, request that the patient contact the pharmacy for the refill.) (Agent: If yes, when and what did the pharmacy advise?) She spoke with the pharmacy last week and they gave her a few to hold her over but she needed to contact her provider. Appointment made for 12-13-2020  Preferred Pharmacy (with phone number or street name): [id number] - [place]), [place] - [address]  Agent: Please be advised that RX refills may take up to 3 business days. We ask that you follow-up with your pharmacy.

## 2020-12-08 NOTE — Telephone Encounter (Signed)
We can wait until visit.

## 2020-12-08 NOTE — Telephone Encounter (Signed)
[name] was on 08/05/2020 and next appointment is on 12/13/2020. Looks like the pharmacy gave her enough to last until her appointment on 12/13/2020. Do want to wait until appointment to refill medication? Please advise.

## 2020-12-09 ENCOUNTER — Other Ambulatory Visit: Payer: Self-pay | Admitting: Physician Assistant

## 2020-12-09 DIAGNOSIS — E119 Type 2 diabetes mellitus without complications: Secondary | ICD-10-CM

## 2020-12-09 NOTE — Telephone Encounter (Signed)
Future visit in 4 days . Needs labs , Last labs 08/11/20

## 2020-12-13 ENCOUNTER — Other Ambulatory Visit: Payer: Self-pay

## 2020-12-13 ENCOUNTER — Ambulatory Visit: Payer: 59 | Admitting: Physician Assistant

## 2020-12-13 VITALS — BP 190/99 | HR 108 | Temp 98.6°F | Resp 16 | Ht 60.0 in | Wt 291.0 lb

## 2020-12-13 DIAGNOSIS — E1169 Type 2 diabetes mellitus with other specified complication: Secondary | ICD-10-CM

## 2020-12-13 DIAGNOSIS — Z9119 Patient's noncompliance with other medical treatment and regimen: Secondary | ICD-10-CM | POA: Diagnosis not present

## 2020-12-13 DIAGNOSIS — I10 Essential (primary) hypertension: Secondary | ICD-10-CM

## 2020-12-13 DIAGNOSIS — Z91199 Patient's noncompliance with other medical treatment and regimen due to unspecified reason: Secondary | ICD-10-CM

## 2020-12-13 DIAGNOSIS — I639 Cerebral infarction, unspecified: Secondary | ICD-10-CM

## 2020-12-13 DIAGNOSIS — E785 Hyperlipidemia, unspecified: Secondary | ICD-10-CM

## 2020-12-13 DIAGNOSIS — E119 Type 2 diabetes mellitus without complications: Secondary | ICD-10-CM | POA: Diagnosis not present

## 2020-12-13 LAB — POCT GLYCOSYLATED HEMOGLOBIN (HGB A1C): Hemoglobin A1C: 10.5 % — AB (ref 4.0–5.6)

## 2020-12-13 MED ORDER — AMLODIPINE BESYLATE 10 MG PO TABS: 10.0000 mg | ORAL_TABLET | Freq: Every day | ORAL | 0 refills | Status: DC

## 2020-12-13 MED ORDER — HYDROCHLOROTHIAZIDE 25 MG PO TABS: 25.0000 mg | ORAL_TABLET | Freq: Every day | ORAL | 0 refills | Status: DC

## 2020-12-13 MED ORDER — ATORVASTATIN CALCIUM 10 MG PO TABS: 10.0000 mg | ORAL_TABLET | Freq: Every day | ORAL | 0 refills | Status: DC

## 2020-12-13 MED ORDER — SITAGLIPTIN PHOSPHATE 100 MG PO TABS: 100.0000 mg | ORAL_TABLET | Freq: Every day | ORAL | 0 refills | Status: DC

## 2020-12-13 MED ORDER — GLIMEPIRIDE 4 MG PO TABS: ORAL_TABLET | ORAL | 0 refills | Status: DC

## 2020-12-13 NOTE — Progress Notes (Signed)
Established patient visit   Patient: Samantha [name]   DOB: 1956/01/16   65 y.o. Female  MRN: [id number] Visit Date: 12/13/2020  Today's healthcare provider: Trey Sailors, PA-C   Chief Complaint  Patient presents with  . Diabetes   Subjective    HPI  Diabetes Mellitus Type II, Follow-up  Lab Results  Component Value Date   HGBA1C 10.5 (A) 12/13/2020   HGBA1C 9.4 (A) 08/11/2020   HGBA1C 9.4 (A) 11/18/2019   Wt Readings from Last 3 Encounters:  12/13/20 291 lb (132 kg)  08/05/20 295 lb 6.4 oz (134 kg)  11/18/19 283 lb (128.4 kg)   Last seen for diabetes 4 months ago.  Management since then includes adding [place]. Patient went to the pharmacy for prescription which was 600 dollars. She says she could not afford this and so did not pick it up. She did not contact the clinic after this.   She also reports she has been taking glimeperide 4 mg once daily despite it being prescribed as take two 4 mg tablets daily since 07/2019. She reports having no idea she is supposed to take two tablets daily. Reports she reads the label every morning before taking her medication.   She reports poor compliance with treatment. She is not having side effects.  Symptoms: No fatigue No foot ulcerations  No appetite changes No nausea  No paresthesia of the feet  No polydipsia  No polyuria No visual disturbances   No vomiting     Home blood sugar records: not being checked  Episodes of hypoglycemia? No    Current insulin regiment: none Most Recent Eye Exam: due Current exercise: no regular exercise Current diet habits: well balanced  Hypertension, follow-up  BP Readings from Last 3 Encounters:  12/13/20 (!) 190/99  08/05/20 (!) 192/119  11/18/19 (!) 191/117   Wt Readings from Last 3 Encounters:  12/13/20 291 lb (132 kg)  08/05/20 295 lb 6.4 oz (134 kg)  11/18/19 283 lb (128.4 kg)     She was last seen for hypertension 4 months ago.  BP at that visit was 192/119.  Management since that visit includes taking amlodipine consistently.  She reports poor compliance with treatment. Patient reports that she has been out of the amlodipine. She has not taken it in several weeks. Wonders why her BP is so high in clinic today. States at home it is lower ranging in the 140s-170s/90s.  She is not having side effects.  She is following a Regular diet. She is not exercising. She does not smoke.  Use of agents associated with hypertension: none.   Outside blood pressures are checked occasionally. Symptoms: No chest pain No chest pressure  No palpitations No syncope  No dyspnea No orthopnea  No paroxysmal nocturnal dyspnea No lower extremity edema   Pertinent labs: Lab Results  Component Value Date   CHOL 234 (H) 11/18/2019   HDL 64 11/18/2019   LDLCALC 153 (H) 11/18/2019   TRIG 96 11/18/2019   CHOLHDL 3.7 11/18/2019   Lab Results  Component Value Date   NA 138 11/18/2019   K 4.3 11/18/2019   CREATININE 0.86 11/18/2019   GFRNONAA 73 11/18/2019   GFRAA 84 11/18/2019   GLUCOSE 233 (H) 11/18/2019     The 10-year ASCVD risk score Denman George DC Jr., et al., 2013) is: 46.9%   Lipid/Cholesterol, Follow-up  Last lipid panel Other pertinent labs  Lab Results  Component Value Date   CHOL 234 (H)  11/18/2019   HDL 64 11/18/2019   LDLCALC 153 (H) 11/18/2019   TRIG 96 11/18/2019   CHOLHDL 3.7 11/18/2019   Lab Results  Component Value Date   ALT 10 11/18/2019   AST 16 11/18/2019   PLT 199 11/18/2019   TSH 1.590 11/18/2019     She was last seen for this 4 months ago.  Management since that visit includes starting atorvastatin .  She reports poor compliance with treatment. She is not having side effects.   Symptoms: No chest pain No chest pressure/discomfort  No dyspnea No lower extremity edema  No numbness or tingling of extremity No orthopnea  No palpitations No paroxysmal nocturnal dyspnea  No speech difficulty No syncope   Current diet: well  balanced Current exercise: no regular exercise  The 10-year ASCVD risk score Denman George DC Jr., et al., 2013) is: 46.9%      Medications: Outpatient Medications Prior to Visit  Medication Sig  . lisinopril (ZESTRIL) 40 MG tablet Take 1 tablet by mouth once daily  . [DISCONTINUED] glimepiride (AMARYL) 4 MG tablet TAKE 2 TABLETS BY MOUTH ONCE DAILY IN THE MORNING WITH BREAKFAST . APPOINTMENT REQUIRED FOR FUTURE REFILLS  . [DISCONTINUED] amLODipine (NORVASC) 10 MG tablet Take 1 tablet (10 mg total) by mouth daily. (Patient not taking: Reported on 12/13/2020)  . [DISCONTINUED] atorvastatin (LIPITOR) 10 MG tablet Take 1 tablet (10 mg total) by mouth daily. (Patient not taking: Reported on 12/13/2020)  . [DISCONTINUED] dapagliflozin propanediol (FARXIGA) 10 MG TABS tablet Take 1 tablet (10 mg total) by mouth daily before breakfast. (Patient not taking: Reported on 12/13/2020)   No facility-administered medications prior to visit.    Review of Systems  Constitutional: Negative.   Respiratory: Negative.   Cardiovascular: Negative.   Endocrine: Negative.   Musculoskeletal: Negative.   Neurological: Negative.        Objective    BP (!) 190/99   Pulse (!) 108   Temp 98.6 F (37 C)   Resp 16   Ht 5' (1.524 m)   Wt 291 lb (132 kg)   BMI 56.83 kg/m     Physical Exam Constitutional:      Appearance: Normal appearance.  Cardiovascular:     Rate and Rhythm: Normal rate and regular rhythm.     Heart sounds: Normal heart sounds.  Pulmonary:     Effort: Pulmonary effort is normal.     Breath sounds: Normal breath sounds.  Skin:    General: Skin is warm and dry.  Neurological:     Mental Status: She is alert and oriented to person, place, and time. Mental status is at baseline.  Psychiatric:        Mood and Affect: Mood normal.        Behavior: Behavior normal.       Results for orders placed or performed in visit on 12/13/20  POCT glycosylated hemoglobin (Hb A1C)  Result Value  Ref Range   Hemoglobin A1C 10.5 (A) 4.0 - 5.6 %   HbA1c POC (<> result, manual entry)     HbA1c, POC (prediabetic range)     HbA1c, POC (controlled diabetic range)      Assessment & Plan    1. Type 2 diabetes mellitus without complication, without long-term current use of insulin (HCC)  Extremely noncompliant with treatment and follow up. Her diabetes is only slightly better controlled than when she was established here in 2019, from 14% to 10.5%. Does not take medications correctly, show up as  requested to appointments. Explained correct dose of glimeperide. Will give [place], this is unlikely to fully control her DM and I have told her as much. Follow up in 3 months.   - POCT glycosylated hemoglobin (Hb A1C) - sitaGLIPtin (JANUVIA) 100 MG tablet; Take 1 tablet (100 mg total) by mouth daily.  Dispense: 90 tablet; Refill: 0 - atorvastatin (LIPITOR) 10 MG tablet; Take 1 tablet (10 mg total) by mouth daily.  Dispense: 90 tablet; Refill: 0 - glimepiride (AMARYL) 4 MG tablet; TAKE 2 TABLETS BY MOUTH ONCE DAILY IN THE MORNING WITH BREAKFAST . APPOINTMENT REQUIRED FOR FUTURE REFILLS  Dispense: 180 tablet; Refill: 0  2. Hypertension, unspecified type  Uncontrolled. Have counseled her blood pressure is high because it is uncontrolled. She reports she ran out of her medications though I suspect it is chronically uncontrolled even when she is taking all of her medications. Add HCTZ as below. Follow up 3 months for recheck and CMET.   - amLODipine (NORVASC) 10 MG tablet; Take 1 tablet (10 mg total) by mouth daily.  Dispense: 90 tablet; Refill: 0 - hydrochlorothiazide (HYDRODIURIL) 25 MG tablet; Take 1 tablet (25 mg total) by mouth daily.  Dispense: 90 tablet; Refill: 0  3. Hyperlipidemia associated with type 2 diabetes mellitus (HCC)  Continue statin.   4. Noncompliance with diabetes treatment  See above.    No follow-ups on file.      ITrey Sailors, PA-C, have reviewed all  documentation for this visit. The documentation on 12/15/20 for the exam, diagnosis, procedures, and orders are all accurate and complete.  The entirety of the information documented in the History of Present Illness, Review of Systems and Physical Exam were personally obtained by me. Portions of this information were initially documented by Anson [place], CMA and reviewed by me for thoroughness and accuracy.     Maryella Shivers  [facility] [phone] (phone) [phone] (fax)  [facility]

## 2020-12-15 DIAGNOSIS — I639 Cerebral infarction, unspecified: Secondary | ICD-10-CM | POA: Insufficient documentation

## 2020-12-15 DIAGNOSIS — Z91199 Patient's noncompliance with other medical treatment and regimen due to unspecified reason: Secondary | ICD-10-CM | POA: Insufficient documentation

## 2020-12-15 DIAGNOSIS — Z9119 Patient's noncompliance with other medical treatment and regimen: Secondary | ICD-10-CM | POA: Insufficient documentation

## 2020-12-26 LAB — HM DIABETES EYE EXAM

## 2021-01-20 ENCOUNTER — Telehealth: Payer: Self-pay

## 2021-01-20 NOTE — Telephone Encounter (Signed)
Called patient to canceled appointment for her appointment that she has 05/23 asked patient if I can reschedule her with another provider. Per patient it was because of Ricki Rodriguez that she was coming to this practice. For now canceled the appointment and she will call back to reschedule with another provider it was "too much to handle at this time"

## 2021-03-21 ENCOUNTER — Ambulatory Visit: Payer: Self-pay | Admitting: Physician Assistant

## 2021-04-05 ENCOUNTER — Telehealth: Payer: Self-pay | Admitting: Physician Assistant

## 2021-04-05 NOTE — Telephone Encounter (Signed)
[facility] faxed refill request for the following medications:  lisinopril (ZESTRIL) 40 MG tablet  Last Rx: 12/05/20 Qty: 90 Refills: 0 LOV: 12/13/20 Please advise. Thanks TNP

## 2021-04-06 ENCOUNTER — Other Ambulatory Visit: Payer: Self-pay

## 2021-04-06 DIAGNOSIS — I10 Essential (primary) hypertension: Secondary | ICD-10-CM

## 2021-04-06 MED ORDER — LISINOPRIL 40 MG PO TABS: 40.0000 mg | ORAL_TABLET | Freq: Every day | ORAL | 0 refills | Status: DC

## 2021-06-06 ENCOUNTER — Other Ambulatory Visit: Payer: Self-pay | Admitting: Physician Assistant

## 2021-06-06 DIAGNOSIS — I10 Essential (primary) hypertension: Secondary | ICD-10-CM

## 2021-06-06 DIAGNOSIS — E119 Type 2 diabetes mellitus without complications: Secondary | ICD-10-CM

## 2021-06-06 NOTE — Telephone Encounter (Signed)
Requested medication (s) are due for refill today:   Yes for both  Requested medication (s) are on the active medication list:   Yes for both  Future visit scheduled:   No   Former pt of Osvaldo Angst  Not seen another provider   Last ordered: Amaryl 12/13/2020 #180 , 0 refills;    lisinopril 04/06/2021 #90, 0 refills  Returned because not been seen since [place] was there.   Requested Prescriptions  Pending Prescriptions Disp Refills   glimepiride (AMARYL) 4 MG tablet 180 tablet 0    Sig: TAKE 2 TABLETS BY MOUTH ONCE DAILY IN THE MORNING WITH BREAKFAST . APPOINTMENT REQUIRED FOR FUTURE REFILLS      Endocrinology:  Diabetes - Sulfonylureas Failed - 06/06/2021 12:28 PM      Failed - HBA1C is between 0 and 7.9 and within 180 days    Hemoglobin A1C  Date Value Ref Range Status  12/13/2020 10.5 (A) 4.0 - 5.6 % Final   Hgb A1c MFr Bld  Date Value Ref Range Status  10/10/2018 14.1 (H) 4.8 - 5.6 % Final    Comment:             Prediabetes: 5.7 - 6.4          Diabetes: >6.4          Glycemic control for adults with diabetes: <7.0           Passed - Valid encounter within last 6 months    Recent Outpatient Visits           5 months ago Type 2 diabetes mellitus without complication, without long-term current use of insulin [facility])   [facility] [place], Adriana M, PA-C   10 months ago Type 2 diabetes mellitus without complication, without long-term current use of insulin [facility])   [facility] [place], [place], [place]   1 year ago Hypertension, unspecified type   [facility] [place], [place], PA-C   1 year ago Type 2 diabetes mellitus without complication, without long-term current use of insulin [facility])   [facility] [place], [place], [place]   2 years ago Type 2 diabetes mellitus without complication, without long-term current use of insulin [facility])   [facility] [place], Adriana M, PA-C                   lisinopril (ZESTRIL) 40 MG tablet 90 tablet 0    Sig: Take 1 tablet (40 mg total) by mouth daily.      Cardiovascular:  ACE Inhibitors Failed - 06/06/2021 12:28 PM      Failed - Cr in normal range and within 180 days    Creatinine, Ser  Date Value Ref Range Status  11/18/2019 0.86 0.57 - 1.00 mg/dL Final          Failed - K in normal range and within 180 days    Potassium  Date Value Ref Range Status  11/18/2019 4.3 3.5 - 5.2 mmol/L Final          Failed - Last BP in normal range    BP Readings from Last 1 Encounters:  12/13/20 (!) 190/99          Passed - Patient is not pregnant      Passed - Valid encounter within last 6 months    Recent Outpatient Visits           5 months ago Type 2 diabetes mellitus without complication, without  long-term current use of insulin [facility])   [facility], Ricki Rodriguez M, [place]   10 months ago Type 2 diabetes mellitus without complication, without long-term current use of insulin [facility])   [facility] [place], [place], [place]   1 year ago Hypertension, unspecified type   [facility] [place], [place], PA-C   1 year ago Type 2 diabetes mellitus without complication, without long-term current use of insulin [facility])   [facility] [place], [place], [place]   2 years ago Type 2 diabetes mellitus without complication, without long-term current use of insulin [facility])   [facility] [place], [place], [place]

## 2021-06-06 NOTE — Telephone Encounter (Signed)
Copied from CRM [id number]. Topic: Quick Communication - Rx Refill/Question >> Jun 06, 2021 12:24 PM [name], Everette A wrote: Medication: glimepiride (AMARYL) 4 MG tablet  lisinopril (ZESTRIL) 40 MG tablet  Has the patient contacted their pharmacy? Yes.   (Agent: If no, request that the patient contact the pharmacy for the refill.) (Agent: If yes, when and what did the pharmacy advise?)  Preferred Pharmacy (with phone number or street name): [facility] [address] [place]), [place] - [address]  Phone:  [phone] Fax:  [phone]  Agent: Please be advised that RX refills may take up to 3 business days. We ask that you follow-up with your pharmacy.

## 2021-06-07 ENCOUNTER — Other Ambulatory Visit: Payer: Self-pay | Admitting: Family Medicine

## 2021-06-07 DIAGNOSIS — I10 Essential (primary) hypertension: Secondary | ICD-10-CM

## 2021-06-07 MED ORDER — GLIMEPIRIDE 4 MG PO TABS: ORAL_TABLET | ORAL | 0 refills | Status: DC

## 2021-06-07 MED ORDER — LISINOPRIL 40 MG PO TABS: 40.0000 mg | ORAL_TABLET | Freq: Every day | ORAL | 0 refills | Status: DC

## 2021-06-07 NOTE — Telephone Encounter (Signed)
   Notes to clinic:  Patient due for appt on 06/23/2021 Requesting refill Will be sending new provider  Due for labs   Requested Prescriptions  Pending Prescriptions Disp Refills   hydrochlorothiazide (HYDRODIURIL) 25 MG tablet 90 tablet 0    Sig: Take 1 tablet (25 mg total) by mouth daily.      Cardiovascular: Diuretics - Thiazide Failed - 06/07/2021 10:09 AM      Failed - Ca in normal range and within 360 days    Calcium  Date Value Ref Range Status  11/18/2019 9.6 8.7 - 10.3 mg/dL Final          Failed - Cr in normal range and within 360 days    Creatinine, Ser  Date Value Ref Range Status  11/18/2019 0.86 0.57 - 1.00 mg/dL Final          Failed - K in normal range and within 360 days    Potassium  Date Value Ref Range Status  11/18/2019 4.3 3.5 - 5.2 mmol/L Final          Failed - Na in normal range and within 360 days    Sodium  Date Value Ref Range Status  11/18/2019 138 134 - 144 mmol/L Final          Failed - Last BP in normal range    BP Readings from Last 1 Encounters:  12/13/20 (!) 190/99          Passed - Valid encounter within last 6 months    Recent Outpatient Visits           5 months ago Type 2 diabetes mellitus without complication, without long-term current use of insulin (HCC)   [facility] [place], Adriana M, PA-C   10 months ago Type 2 diabetes mellitus without complication, without long-term current use of insulin [facility])   [facility] [place], [place], [place]   1 year ago Hypertension, unspecified type   [facility] [place], [place], PA-C   1 year ago Type 2 diabetes mellitus without complication, without long-term current use of insulin [facility])   [facility] [place], [place], [place]   2 years ago Type 2 diabetes mellitus without complication, without long-term current use of insulin [facility])   [facility] [place], [place], [place]       Future Appointments              In 2 weeks Chrismon, Jodell Cipro, PA-C [employer]

## 2021-06-07 NOTE — Telephone Encounter (Signed)
Copied from CRM [id number]. Topic: Quick Communication - Rx Refill/Question >> Jun 07, 2021  9:58 AM Jaquita Rector A wrote: Medication: lisinopril (ZESTRIL) 40 MG tablet, glimepiride (AMARYL) 4 MG tablet, hydrochlorothiazide (HYDRODIURIL) 25 MG tablet   Has the patient contacted their pharmacy? No. (Agent: If no, request that the patient contact the pharmacy for the refill.) (Agent: If yes, when and what did the pharmacy advise?)  Preferred Pharmacy (with phone number or street name): [facility] [address] [place]), [place] - [address]  Phone:  [phone] Fax:  [phone]     Agent: Please be advised that RX refills may take up to 3 business days. We ask that you follow-up with your pharmacy.

## 2021-06-08 ENCOUNTER — Telehealth: Payer: Self-pay

## 2021-06-08 ENCOUNTER — Other Ambulatory Visit: Payer: Self-pay | Admitting: Family Medicine

## 2021-06-08 DIAGNOSIS — I10 Essential (primary) hypertension: Secondary | ICD-10-CM

## 2021-06-08 NOTE — Telephone Encounter (Signed)
Reviewing over medication I see that medication was not electronically sent to [facility], will call pharmacy and give verbal over phone. KW

## 2021-06-08 NOTE — Telephone Encounter (Signed)
Copied from CRM [id number]. Topic: General - Other >> Jun 08, 2021  9:03 AM Jaquita Rector A wrote: Reason for CRM: Pharmacy called in to infrom Dr B that Rx sent on 06/07/21 for lisinopril (ZESTRIL) 40 MG tablet was not received and patient told them she was instructed to take 2 so that is why she run out earlier than the Rx should last Please advise and resend Rx to [facility] [id number]), [place] - [address] Phone:  [phone]   Fax:  [phone]

## 2021-06-12 MED ORDER — HYDROCHLOROTHIAZIDE 25 MG PO TABS: 25.0000 mg | ORAL_TABLET | Freq: Every day | ORAL | 0 refills | Status: DC

## 2021-06-23 ENCOUNTER — Ambulatory Visit (INDEPENDENT_AMBULATORY_CARE_PROVIDER_SITE_OTHER): Payer: 59 | Admitting: Family Medicine

## 2021-06-23 ENCOUNTER — Other Ambulatory Visit: Payer: Self-pay

## 2021-06-23 ENCOUNTER — Encounter: Payer: Self-pay | Admitting: Family Medicine

## 2021-06-23 VITALS — BP 200/108 | HR 103 | Temp 98.7°F | Resp 18 | Wt 288.0 lb

## 2021-06-23 DIAGNOSIS — E785 Hyperlipidemia, unspecified: Secondary | ICD-10-CM | POA: Diagnosis not present

## 2021-06-23 DIAGNOSIS — E119 Type 2 diabetes mellitus without complications: Secondary | ICD-10-CM

## 2021-06-23 DIAGNOSIS — I10 Essential (primary) hypertension: Secondary | ICD-10-CM

## 2021-06-23 LAB — POCT GLYCOSYLATED HEMOGLOBIN (HGB A1C)
Est. average glucose Bld gHb Est-mCnc: 283
Hemoglobin A1C: 11.5 % — AB (ref 4.0–5.6)

## 2021-06-23 MED ORDER — GLIMEPIRIDE 4 MG PO TABS: ORAL_TABLET | ORAL | 11 refills | Status: DC

## 2021-06-23 MED ORDER — ATORVASTATIN CALCIUM 10 MG PO TABS: 10.0000 mg | ORAL_TABLET | Freq: Every day | ORAL | 3 refills | Status: DC

## 2021-06-23 MED ORDER — HYDROCHLOROTHIAZIDE 25 MG PO TABS: 25.0000 mg | ORAL_TABLET | Freq: Every day | ORAL | 3 refills | Status: DC

## 2021-06-23 MED ORDER — AMLODIPINE BESYLATE 10 MG PO TABS: 10.0000 mg | ORAL_TABLET | Freq: Every day | ORAL | 3 refills | Status: DC

## 2021-06-23 NOTE — Progress Notes (Signed)
Established patient visit   Patient: Samantha [name]   DOB: 12-15-1955   65 y.o. Female  MRN: [id number] Visit Date: 06/23/2021  Today's healthcare provider: Dortha Kern, PA-C   Chief Complaint  Patient presents with   Diabetes   Hypertension   Hyperlipidemia   Subjective  -------------------------------------------------------------------------------------------------------------------- HPI  Diabetes Mellitus Type II, Follow-up  Lab Results  Component Value Date   HGBA1C 11.5 (A) 06/23/2021   HGBA1C 10.5 (A) 12/13/2020   HGBA1C 9.4 (A) 08/11/2020   Wt Readings from Last 3 Encounters:  06/23/21 288 lb (130.6 kg)  12/13/20 291 lb (132 kg)  08/05/20 295 lb 6.4 oz (134 kg)   Last seen for diabetes 6 months ago (seen by Osvaldo Angst, PA-C).  Management since then includes adding Januvia. She reports poor compliance with treatment. Patient did not start Januvia due to cost She is not having side effects.  Symptoms: No fatigue No foot ulcerations  No appetite changes No nausea  No paresthesia of the feet  No polydipsia  No polyuria No visual disturbances   No vomiting     Home blood sugar records: fasting range: over 200  Episodes of hypoglycemia? No    Current insulin regiment: none Most Recent Eye Exam: not UTD Current exercise: none Current diet habits: well balanced  Pertinent Labs: Lab Results  Component Value Date   CHOL 234 (H) 11/18/2019   HDL 64 11/18/2019   LDLCALC 153 (H) 11/18/2019   TRIG 96 11/18/2019   CHOLHDL 3.7 11/18/2019   Lab Results  Component Value Date   NA 138 11/18/2019   K 4.3 11/18/2019   CREATININE 0.86 11/18/2019   GFRNONAA 73 11/18/2019   GFRAA 84 11/18/2019   GLUCOSE 233 (H) 11/18/2019     ---------------------------------------------------------------------------------------------------   Hypertension, follow-up  BP Readings from Last 3 Encounters:  06/23/21 (!) 200/108  12/13/20 (!) 190/99  08/05/20 (!)  192/119   Wt Readings from Last 3 Encounters:  06/23/21 288 lb (130.6 kg)  12/13/20 291 lb (132 kg)  08/05/20 295 lb 6.4 oz (134 kg)     She was last seen for hypertension 6 months ago (seen by Osvaldo Angst, PA-C).  BP at that visit was 190/99. Management since that visit includes adding HCTZ.  She reports good compliance with treatment. She is not having side effects.  She is following a Regular diet. She is not exercising. She does not smoke.  Use of agents associated with hypertension: none.   Outside blood pressures are checked and average 180/90. Symptoms: No chest pain No chest pressure  No palpitations No syncope  No dyspnea No orthopnea  No paroxysmal nocturnal dyspnea No lower extremity edema   Pertinent labs: Lab Results  Component Value Date   CHOL 234 (H) 11/18/2019   HDL 64 11/18/2019   LDLCALC 153 (H) 11/18/2019   TRIG 96 11/18/2019   CHOLHDL 3.7 11/18/2019   Lab Results  Component Value Date   NA 138 11/18/2019   K 4.3 11/18/2019   CREATININE 0.86 11/18/2019   GFRNONAA 73 11/18/2019   GFRAA 84 11/18/2019   GLUCOSE 233 (H) 11/18/2019     The ASCVD Risk score (Goff DC Jr., et al., 2013) failed to calculate for the following reasons:   The patient has a prior MI or stroke diagnosis   ---------------------------------------------------------------------------------------------------   Lipid/Cholesterol, Follow-up  Last lipid panel Other pertinent labs  Lab Results  Component Value Date   CHOL 234 (H) 11/18/2019  HDL 64 11/18/2019   LDLCALC 153 (H) 11/18/2019   TRIG 96 11/18/2019   CHOLHDL 3.7 11/18/2019   Lab Results  Component Value Date   ALT 10 11/18/2019   AST 16 11/18/2019   PLT 199 11/18/2019   TSH 1.590 11/18/2019     She was last seen for this 6 months ago.  Management since that visit includes continue statin.  She reports good compliance with treatment. She is not having side effects.   Symptoms: No chest pain No chest  pressure/discomfort  No dyspnea No lower extremity edema  No numbness or tingling of extremity No orthopnea  No palpitations No paroxysmal nocturnal dyspnea  No speech difficulty No syncope   Current diet: well balanced Current exercise: none  The ASCVD Risk score Denman George DC Jr., et al., 2013) failed to calculate for the following reasons:   The patient has a prior MI or stroke diagnosis  ---------------------------------------------------------------------------------------------------   No past medical history on file. Past Surgical History:  Procedure Laterality Date   TUBAL LIGATION     23 yrs ago   Social History   Tobacco Use   Smoking status: Never   Smokeless tobacco: Never  Substance Use Topics   Alcohol use: Never   Drug use: Never   Family Status  Relation Name Status   Mother  (Not Specified)   MGM  Deceased       Alzheimers   MGF  Deceased   PGM  Deceased   No Known Allergies     Medications: Outpatient Medications Prior to Visit  Medication Sig   amLODipine (NORVASC) 10 MG tablet Take 1 tablet (10 mg total) by mouth daily.   atorvastatin (LIPITOR) 10 MG tablet Take 1 tablet (10 mg total) by mouth daily.   glimepiride (AMARYL) 4 MG tablet TAKE 2 TABLETS BY MOUTH ONCE DAILY IN THE MORNING WITH BREAKFAST . APPOINTMENT REQUIRED FOR FUTURE REFILLS   hydrochlorothiazide (HYDRODIURIL) 25 MG tablet Take 1 tablet (25 mg total) by mouth daily.   lisinopril (ZESTRIL) 40 MG tablet Take 1 tablet by mouth once daily   sitaGLIPtin (JANUVIA) 100 MG tablet Take 1 tablet (100 mg total) by mouth daily. (Patient not taking: Reported on 06/23/2021)   No facility-administered medications prior to visit.    Review of Systems  Constitutional:  Negative for appetite change, chills, fatigue and fever.  Respiratory:  Negative for chest tightness and shortness of breath.   Cardiovascular:  Negative for chest pain and palpitations.  Gastrointestinal:  Negative for abdominal  pain, nausea and vomiting.  Neurological:  Negative for dizziness and weakness.   Last CBC Lab Results  Component Value Date   WBC 7.4 11/18/2019   HGB 13.2 11/18/2019   HCT 40.0 11/18/2019   MCV 97 11/18/2019   MCH 32.0 11/18/2019   RDW 11.8 11/18/2019   PLT 199 11/18/2019   Last metabolic panel Lab Results  Component Value Date   GLUCOSE 233 (H) 11/18/2019   NA 138 11/18/2019   K 4.3 11/18/2019   CL 101 11/18/2019   CO2 21 11/18/2019   BUN 13 11/18/2019   CREATININE 0.86 11/18/2019   GFRNONAA 73 11/18/2019   GFRAA 84 11/18/2019   CALCIUM 9.6 11/18/2019   PROT 7.2 11/18/2019   ALBUMIN 4.0 11/18/2019   LABGLOB 3.2 11/18/2019   AGRATIO 1.3 11/18/2019   BILITOT 0.3 11/18/2019   ALKPHOS 86 11/18/2019   AST 16 11/18/2019   ALT 10 11/18/2019   Last lipids Lab Results  Component Value Date   CHOL 234 (H) 11/18/2019   HDL 64 11/18/2019   LDLCALC 153 (H) 11/18/2019   TRIG 96 11/18/2019   CHOLHDL 3.7 11/18/2019   Last hemoglobin A1c Lab Results  Component Value Date   HGBA1C 11.5 (A) 06/23/2021   Last thyroid functions Lab Results  Component Value Date   TSH 1.590 11/18/2019       Objective  -------------------------------------------------------------------------------------------------------------------- BP (!) 200/108 (BP Location: Left Arm, Patient Position: Sitting, Cuff Size: Large)   Pulse (!) 103   Temp 98.7 F (37.1 C) (Temporal)   Resp 18   Wt 288 lb (130.6 kg)   SpO2 100% Comment: room air  BMI 56.25 kg/m  BP Readings from Last 3 Encounters:  06/23/21 (!) 200/108  12/13/20 (!) 190/99  08/05/20 (!) 192/119   Wt Readings from Last 3 Encounters:  06/23/21 288 lb (130.6 kg)  12/13/20 291 lb (132 kg)  08/05/20 295 lb 6.4 oz (134 kg)    Physical Exam Constitutional:      General: She is not in acute distress.    Appearance: She is well-developed.  HENT:     Head: Normocephalic and atraumatic.     Right Ear: Hearing and tympanic membrane  normal.     Left Ear: Hearing and tympanic membrane normal.     Nose: Nose normal.     Mouth/Throat:     Mouth: Mucous membranes are moist.     Pharynx: Oropharynx is clear.  Eyes:     General: Lids are normal. No scleral icterus.       Right eye: No discharge.        Left eye: No discharge.     Conjunctiva/sclera: Conjunctivae normal.  Cardiovascular:     Rate and Rhythm: Regular rhythm. Tachycardia present.     Pulses: Normal pulses.     Heart sounds: Normal heart sounds.  Pulmonary:     Effort: Pulmonary effort is normal. No respiratory distress.     Breath sounds: Normal breath sounds.  Abdominal:     General: Bowel sounds are normal.     Palpations: Abdomen is soft.  Musculoskeletal:        General: Normal range of motion.     Cervical back: Neck supple.  Skin:    Findings: No lesion or rash.  Neurological:     Mental Status: She is alert and oriented to person, place, and time.  Psychiatric:        Speech: Speech normal.        Behavior: Behavior normal.        Thought Content: Thought content normal.    Diabetic Foot Form - Detailed   Diabetic Foot Exam - detailed Diabetic Foot exam was performed with the following findings: Yes 06/23/2021 11:20 AM  Visual Foot Exam completed.: Yes  Can the patient see the bottom of their feet?: Yes Are the shoes appropriate in style and fit?: No Is there swelling or and abnormal foot shape?: No Is there a claw toe deformity?: No Is there elevated skin temparature?: No Is there foot or ankle muscle weakness?: No Normal Range of Motion: Yes Pulse Foot Exam completed.: Yes   Right posterior Tibialias: Present Left posterior Tibialias: Present   Right Dorsalis Pedis: Present Left Dorsalis Pedis: Present  Sensory Foot Exam Completed.: Yes Semmes-Weinstein Monofilament Test R Site 1-Great Toe: Pos L Site 1-Great Toe: Pos           Results for orders placed or performed in  visit on 06/23/21  POCT HgB A1C  Result Value Ref  Range   Hemoglobin A1C 11.5 (A) 4.0 - 5.6 %   Est. average glucose Bld gHb Est-mCnc 283     Assessment & Plan  ---------------------------------------------------------------------------------------------------------------------- 1. Type 2 diabetes mellitus without complication, without long-term current use of insulin (HCC) Hgb A1C 11.5 % today. Some increase in thirst and been off some of medications. Restart Amaryl 4 mg 2 tabs qd. Wants to change Januvia due to cost. Given sample of Farxiga 10 mg qd to check with insurance for cost (will start if GFR above 30). Recheck labs and should follow up soon pending reports. - POCT HgB A1C - CBC with Differential/Platelet - Comprehensive metabolic panel - Lipid panel - glimepiride (AMARYL) 4 MG tablet; TAKE 2 TABLETS BY MOUTH ONCE DAILY IN THE MORNING WITH BREAKFAST .  Dispense: 60 tablet; Refill: 11 - atorvastatin (LIPITOR) 10 MG tablet; Take 1 tablet (10 mg total) by mouth daily.  Dispense: 90 tablet; Refill: 3  2. Primary hypertension BP very high initially. Down to 188/101 after a recheck. Has been off the HCTZ and unsure about compliance with other meds. Recheck labs and refilled Amlodipine. Continue Lisinopril 40 mg qd.  - CBC with Differential/Platelet - Comprehensive metabolic panel - Lipid panel - TSH - hydrochlorothiazide (HYDRODIURIL) 25 MG tablet; Take 1 tablet (25 mg total) by mouth daily.  Dispense: 90 tablet; Refill: 3 - amLODipine (NORVASC) 10 MG tablet; Take 1 tablet (10 mg total) by mouth daily.  Dispense: 90 tablet; Refill: 3  3. Hyperlipidemia, unspecified hyperlipidemia type Doubt she had been taking the Lipitor. Recent refilled and encouraged to work on weight loss. Must follow a low fat diet and walk for exercise. Recheck labs. - Comprehensive metabolic panel - Lipid panel - TSH  4. Morbid obesity (HCC) BMI over 56. Must work on weight loss and recheck labs. - POCT HgB A1C - CBC with Differential/Platelet -  Comprehensive metabolic panel - Lipid panel - TSH   No follow-ups on file.      I, Crystel Demarco, PA-C, have reviewed all documentation for this visit. The documentation on 06/23/21 for the exam, diagnosis, procedures, and orders are all accurate and complete.    Dortha Kern, PA-C  [facility] [phone] (phone) [phone] (fax)  [facility]

## 2022-01-08 ENCOUNTER — Ambulatory Visit: Payer: Self-pay | Admitting: *Deleted

## 2022-01-08 NOTE — Telephone Encounter (Signed)
Reason for Disposition ? Poor fluid intake probably caused the weakness ? ?Answer Assessment - Initial Assessment Questions ?1. DESCRIPTION: "Describe how you are feeling." ?    Weakness in legs- better today ?2. SEVERITY: "How bad is it?"  "Can you stand and walk?" ?  - MILD - Feels weak or tired, but does not interfere with work, school or normal activities ?  - MODERATE - Able to stand and walk; weakness interferes with work, school, or normal activities ?  - SEVERE - Unable to stand or walk ?    mild ?3. ONSET:  "When did the weakness begin?" ?    yesterday ?4. CAUSE: "What do you think is causing the weakness?" ?    Patient feels she got overtired ?5. MEDICINES: "Have you recently started a new medicine or had a change in the amount of a medicine?" ?    No changes ?6. OTHER SYMPTOMS: "Do you have any other symptoms?" (e.g., chest pain, fever, cough, SOB, vomiting, diarrhea, bleeding, other areas of pain) ?    no ?7. PREGNANCY: "Is there any chance you are pregnant?" "When was your last menstrual period?" ?    na ? ?Protocols used: Weakness (Generalized) and Fatigue-A-AH ? ?

## 2022-01-08 NOTE — Telephone Encounter (Signed)
?  Chief Complaint: weakness yesterday ?Symptoms: weakness in legs yesterday ?Frequency: one episode yesterday ?Pertinent Negatives: Patient denies , chest pain, fever, cough, SOB, vomiting, diarrhea, bleeding, other areas of pain ?Disposition: [] ED /[] Urgent Care (no appt availability in office) / [] Appointment(In office/virtual)/ []  [facility]/ [x] Home Care/ [] Refused Recommended Disposition /[] [facility]/ []  Follow-up with PCP ?Additional Notes: Patient states she believes she did too much yesterday- which caused her to feel weak. Patient states she is much better today and is doing normal activity- advised patient and daughter to call back with reoccurrence. Patient declines appointment at this time.   ?

## 2022-01-13 ENCOUNTER — Emergency Department (HOSPITAL_COMMUNITY): Payer: 59

## 2022-01-13 ENCOUNTER — Inpatient Hospital Stay (HOSPITAL_COMMUNITY)
Admission: EM | Admit: 2022-01-13 | Discharge: 2022-01-17 | DRG: 065 | Disposition: A | Discharge status: Rehabilitation Facility | Payer: 59 | Attending: Family Medicine | Admitting: Family Medicine

## 2022-01-13 ENCOUNTER — Inpatient Hospital Stay (HOSPITAL_COMMUNITY): Payer: 59

## 2022-01-13 DIAGNOSIS — Z20822 Contact with and (suspected) exposure to covid-19: Secondary | ICD-10-CM | POA: Diagnosis present

## 2022-01-13 DIAGNOSIS — E7800 Pure hypercholesterolemia, unspecified: Secondary | ICD-10-CM | POA: Diagnosis present

## 2022-01-13 DIAGNOSIS — I679 Cerebrovascular disease, unspecified: Secondary | ICD-10-CM | POA: Diagnosis not present

## 2022-01-13 DIAGNOSIS — E669 Obesity, unspecified: Secondary | ICD-10-CM | POA: Diagnosis not present

## 2022-01-13 DIAGNOSIS — R531 Weakness: Secondary | ICD-10-CM | POA: Diagnosis not present

## 2022-01-13 DIAGNOSIS — Z79899 Other long term (current) drug therapy: Secondary | ICD-10-CM | POA: Diagnosis not present

## 2022-01-13 DIAGNOSIS — N179 Acute kidney failure, unspecified: Secondary | ICD-10-CM | POA: Diagnosis present

## 2022-01-13 DIAGNOSIS — I6300 Cerebral infarction due to thrombosis of unspecified precerebral artery: Secondary | ICD-10-CM | POA: Diagnosis not present

## 2022-01-13 DIAGNOSIS — Z6841 Body Mass Index (BMI) 40.0 and over, adult: Secondary | ICD-10-CM

## 2022-01-13 DIAGNOSIS — Z7984 Long term (current) use of oral hypoglycemic drugs: Secondary | ICD-10-CM | POA: Diagnosis not present

## 2022-01-13 DIAGNOSIS — Z8673 Personal history of transient ischemic attack (TIA), and cerebral infarction without residual deficits: Secondary | ICD-10-CM

## 2022-01-13 DIAGNOSIS — Z880 Allergy status to penicillin: Secondary | ICD-10-CM

## 2022-01-13 DIAGNOSIS — E1122 Type 2 diabetes mellitus with diabetic chronic kidney disease: Secondary | ICD-10-CM | POA: Diagnosis present

## 2022-01-13 DIAGNOSIS — Z8249 Family history of ischemic heart disease and other diseases of the circulatory system: Secondary | ICD-10-CM | POA: Diagnosis not present

## 2022-01-13 DIAGNOSIS — I129 Hypertensive chronic kidney disease with stage 1 through stage 4 chronic kidney disease, or unspecified chronic kidney disease: Secondary | ICD-10-CM | POA: Diagnosis present

## 2022-01-13 DIAGNOSIS — I6381 Other cerebral infarction due to occlusion or stenosis of small artery: Secondary | ICD-10-CM | POA: Diagnosis present

## 2022-01-13 DIAGNOSIS — I639 Cerebral infarction, unspecified: Secondary | ICD-10-CM | POA: Diagnosis present

## 2022-01-13 DIAGNOSIS — R29704 NIHSS score 4: Secondary | ICD-10-CM | POA: Diagnosis present

## 2022-01-13 DIAGNOSIS — G8321 Monoplegia of upper limb affecting right dominant side: Secondary | ICD-10-CM | POA: Diagnosis present

## 2022-01-13 DIAGNOSIS — R4781 Slurred speech: Secondary | ICD-10-CM | POA: Diagnosis present

## 2022-01-13 DIAGNOSIS — E785 Hyperlipidemia, unspecified: Secondary | ICD-10-CM

## 2022-01-13 DIAGNOSIS — I10 Essential (primary) hypertension: Secondary | ICD-10-CM | POA: Diagnosis not present

## 2022-01-13 DIAGNOSIS — R739 Hyperglycemia, unspecified: Secondary | ICD-10-CM

## 2022-01-13 DIAGNOSIS — E1165 Type 2 diabetes mellitus with hyperglycemia: Secondary | ICD-10-CM | POA: Diagnosis present

## 2022-01-13 DIAGNOSIS — R778 Other specified abnormalities of plasma proteins: Secondary | ICD-10-CM | POA: Diagnosis not present

## 2022-01-13 DIAGNOSIS — R609 Edema, unspecified: Secondary | ICD-10-CM | POA: Diagnosis not present

## 2022-01-13 DIAGNOSIS — E119 Type 2 diabetes mellitus without complications: Secondary | ICD-10-CM | POA: Diagnosis not present

## 2022-01-13 DIAGNOSIS — Z9114 Patient's other noncompliance with medication regimen: Secondary | ICD-10-CM

## 2022-01-13 DIAGNOSIS — R0989 Other specified symptoms and signs involving the circulatory and respiratory systems: Secondary | ICD-10-CM | POA: Diagnosis not present

## 2022-01-13 DIAGNOSIS — I152 Hypertension secondary to endocrine disorders: Secondary | ICD-10-CM | POA: Diagnosis present

## 2022-01-13 DIAGNOSIS — N1831 Chronic kidney disease, stage 3a: Secondary | ICD-10-CM | POA: Diagnosis present

## 2022-01-13 DIAGNOSIS — E1169 Type 2 diabetes mellitus with other specified complication: Secondary | ICD-10-CM | POA: Diagnosis not present

## 2022-01-13 DIAGNOSIS — R000 Tachycardia, unspecified: Secondary | ICD-10-CM | POA: Diagnosis not present

## 2022-01-13 DIAGNOSIS — R7989 Other specified abnormal findings of blood chemistry: Secondary | ICD-10-CM

## 2022-01-13 DIAGNOSIS — E871 Hypo-osmolality and hyponatremia: Secondary | ICD-10-CM | POA: Diagnosis present

## 2022-01-13 DIAGNOSIS — I69398 Other sequelae of cerebral infarction: Secondary | ICD-10-CM | POA: Diagnosis not present

## 2022-01-13 DIAGNOSIS — E1159 Type 2 diabetes mellitus with other circulatory complications: Secondary | ICD-10-CM | POA: Diagnosis present

## 2022-01-13 DIAGNOSIS — I63513 Cerebral infarction due to unspecified occlusion or stenosis of bilateral middle cerebral arteries: Secondary | ICD-10-CM | POA: Diagnosis not present

## 2022-01-13 DIAGNOSIS — I6389 Other cerebral infarction: Secondary | ICD-10-CM | POA: Diagnosis not present

## 2022-01-13 LAB — CBC WITH DIFFERENTIAL/PLATELET
Abs Immature Granulocytes: 0.01 10*3/uL (ref 0.00–0.07)
Basophils Absolute: 0.1 10*3/uL (ref 0.0–0.1)
Basophils Relative: 1 %
Eosinophils Absolute: 0.1 10*3/uL (ref 0.0–0.5)
Eosinophils Relative: 2 %
HCT: 44.1 % (ref 36.0–46.0)
Hemoglobin: 14.3 g/dL (ref 12.0–15.0)
Immature Granulocytes: 0 %
Lymphocytes Relative: 19 %
Lymphs Abs: 1.3 10*3/uL (ref 0.7–4.0)
MCH: 32.1 pg (ref 26.0–34.0)
MCHC: 32.4 g/dL (ref 30.0–36.0)
MCV: 98.9 fL (ref 80.0–100.0)
Monocytes Absolute: 0.5 10*3/uL (ref 0.1–1.0)
Monocytes Relative: 7 %
Neutro Abs: 4.8 10*3/uL (ref 1.7–7.7)
Neutrophils Relative %: 71 %
Platelets: 151 10*3/uL (ref 150–400)
RBC: 4.46 MIL/uL (ref 3.87–5.11)
RDW: 12 % (ref 11.5–15.5)
WBC: 6.8 10*3/uL (ref 4.0–10.5)
nRBC: 0 % (ref 0.0–0.2)

## 2022-01-13 LAB — BASIC METABOLIC PANEL
Anion gap: 10 (ref 5–15)
BUN: 25 mg/dL — ABNORMAL HIGH (ref 8–23)
CO2: 29 mmol/L (ref 22–32)
Calcium: 9.4 mg/dL (ref 8.9–10.3)
Chloride: 95 mmol/L — ABNORMAL LOW (ref 98–111)
Creatinine, Ser: 1.26 mg/dL — ABNORMAL HIGH (ref 0.44–1.00)
GFR, Estimated: 48 mL/min — ABNORMAL LOW (ref >60)
Glucose, Bld: 494 mg/dL — ABNORMAL HIGH (ref 70–99)
Potassium: 4.8 mmol/L (ref 3.5–5.1)
Sodium: 134 mmol/L — ABNORMAL LOW (ref 135–145)

## 2022-01-13 LAB — PROTIME-INR
INR: 1.1 (ref 0.8–1.2)
Prothrombin Time: 13.9 seconds (ref 11.4–15.2)

## 2022-01-13 LAB — LIPID PANEL
Cholesterol: 184 mg/dL (ref 0–200)
HDL: 53 mg/dL (ref >40)
LDL Cholesterol: 115 mg/dL — ABNORMAL HIGH (ref 0–99)
Total CHOL/HDL Ratio: 3.5 RATIO
Triglycerides: 82 mg/dL (ref <150)
VLDL: 16 mg/dL (ref 0–40)

## 2022-01-13 LAB — CBG MONITORING, ED: Glucose-Capillary: 444 mg/dL — ABNORMAL HIGH (ref 70–99)

## 2022-01-13 LAB — GLUCOSE, CAPILLARY: Glucose-Capillary: 469 mg/dL — ABNORMAL HIGH (ref 70–99)

## 2022-01-13 LAB — TROPONIN I (HIGH SENSITIVITY): Troponin I (High Sensitivity): 25 ng/L — ABNORMAL HIGH (ref <18)

## 2022-01-13 LAB — RESP PANEL BY RT-PCR (FLU A&B, COVID) ARPGX2
Influenza A by PCR: NEGATIVE
Influenza B by PCR: NEGATIVE
SARS Coronavirus 2 by RT PCR: NEGATIVE

## 2022-01-13 MED ORDER — SODIUM CHLORIDE 0.9 % IV SOLN: INTRAVENOUS | Status: DC

## 2022-01-13 MED ORDER — SODIUM CHLORIDE 0.9 % IV BOLUS: 500.0000 mL | Freq: Once | INTRAVENOUS | Status: DC

## 2022-01-13 MED ORDER — ASPIRIN EC 81 MG PO TBEC
81.0000 mg | DELAYED_RELEASE_TABLET | Freq: Every day | ORAL | Status: DC
  Administered 2022-01-14 – 2022-01-15 (×2): 81 mg via ORAL
  Filled 2022-01-13 (×2): qty 1

## 2022-01-13 MED ORDER — INSULIN ASPART 100 UNIT/ML IJ SOLN
0.0000 [IU] | Freq: Three times a day (TID) | INTRAMUSCULAR | Status: DC
  Administered 2022-01-14: 5 [IU] via SUBCUTANEOUS
  Administered 2022-01-14: 15 [IU] via SUBCUTANEOUS
  Administered 2022-01-14 – 2022-01-15 (×4): 8 [IU] via SUBCUTANEOUS
  Administered 2022-01-16: 2 [IU] via SUBCUTANEOUS
  Administered 2022-01-16 – 2022-01-17 (×2): 5 [IU] via SUBCUTANEOUS
  Administered 2022-01-17: 3 [IU] via SUBCUTANEOUS

## 2022-01-13 MED ORDER — INSULIN GLARGINE-YFGN 100 UNIT/ML ~~LOC~~ SOLN
10.0000 [IU] | Freq: Every day | SUBCUTANEOUS | Status: DC
  Administered 2022-01-13: 10 [IU] via SUBCUTANEOUS
  Filled 2022-01-13 (×2): qty 0.1

## 2022-01-13 MED ORDER — ACETAMINOPHEN 160 MG/5ML PO SOLN: 650.0000 mg | ORAL | Status: DC | PRN

## 2022-01-13 MED ORDER — HYDRALAZINE HCL 20 MG/ML IJ SOLN: 10.0000 mg | INTRAMUSCULAR | Status: DC | PRN

## 2022-01-13 MED ORDER — STROKE: EARLY STAGES OF RECOVERY BOOK
Freq: Once | Status: AC
  Filled 2022-01-13 (×2): qty 1

## 2022-01-13 MED ORDER — ASPIRIN 325 MG PO TABS
325.0000 mg | ORAL_TABLET | Freq: Once | ORAL | Status: AC
  Administered 2022-01-13: 325 mg via ORAL
  Filled 2022-01-13: qty 1

## 2022-01-13 MED ORDER — ACETAMINOPHEN 650 MG RE SUPP: 650.0000 mg | RECTAL | Status: DC | PRN

## 2022-01-13 MED ORDER — ENOXAPARIN SODIUM 40 MG/0.4ML IJ SOSY
40.0000 mg | PREFILLED_SYRINGE | INTRAMUSCULAR | Status: DC
  Administered 2022-01-13 – 2022-01-16 (×4): 40 mg via SUBCUTANEOUS
  Filled 2022-01-13 (×4): qty 0.4

## 2022-01-13 MED ORDER — AMLODIPINE BESYLATE 10 MG PO TABS
10.0000 mg | ORAL_TABLET | Freq: Every day | ORAL | Status: DC
  Administered 2022-01-13: 10 mg via ORAL
  Filled 2022-01-13: qty 1

## 2022-01-13 MED ORDER — ACETAMINOPHEN 325 MG PO TABS: 650.0000 mg | ORAL_TABLET | ORAL | Status: DC | PRN

## 2022-01-13 MED ORDER — INSULIN ASPART 100 UNIT/ML IJ SOLN
0.0000 [IU] | Freq: Every day | INTRAMUSCULAR | Status: DC
  Administered 2022-01-14: 2 [IU] via SUBCUTANEOUS

## 2022-01-13 MED ORDER — ATORVASTATIN CALCIUM 80 MG PO TABS
80.0000 mg | ORAL_TABLET | Freq: Every day | ORAL | Status: DC
  Administered 2022-01-13 – 2022-01-16 (×4): 80 mg via ORAL
  Filled 2022-01-13 (×4): qty 1

## 2022-01-13 MED ORDER — CLOPIDOGREL BISULFATE 75 MG PO TABS
75.0000 mg | ORAL_TABLET | Freq: Every day | ORAL | Status: DC
  Administered 2022-01-13 – 2022-01-17 (×5): 75 mg via ORAL
  Filled 2022-01-13 (×5): qty 1

## 2022-01-13 MED ORDER — IOHEXOL 350 MG/ML SOLN
100.0000 mL | Freq: Once | INTRAVENOUS | Status: AC | PRN
  Administered 2022-01-13: 75 mL via INTRAVENOUS

## 2022-01-13 NOTE — ED Triage Notes (Signed)
PT BIB [facility] for right sided weakness that began on 01/08/22 and has progressed to a flaccid right arm with weakness in right leg and slight facial droop. Ems also endorsed altered gait.  ? ?Pt is diabetic with cbg of 500.  Bp 172/108, 95% HR 104, rr20 ?

## 2022-01-13 NOTE — Assessment & Plan Note (Addendum)
resolved 

## 2022-01-13 NOTE — Assessment & Plan Note (Addendum)
High-sensitivity troponin elevated at 25>37.  Remains chest pain-free.  EKG with NSR, rate 90, QTc 474, T wave inversion 1, aVL, no other concerning dynamic changes.  No previous EKG available for review in EMR. ?--Continue to monitor on telemetry ?

## 2022-01-13 NOTE — Assessment & Plan Note (Addendum)
Patient is initial glucose elevated at 494 without elevated anion gap.  Home medication regimen includes Amaryl 4 mg PO daily.  Hemoglobin A1c 13.1, poorly controlled. ?-- with high a1c, she'll need to discharge on insulin, continue education in CIR ?--Increase Semglee to 45 units daily ?--NovoLog 6 units TIDAC ? ? ?

## 2022-01-13 NOTE — Assessment & Plan Note (Addendum)
On admission LDL 115.  Home medication regimen included atorvastatin 10 mg daily. ?--Goal LDL less than 70 ?--Increased atorvastatin to 80 mg daily ?

## 2022-01-13 NOTE — Consult Note (Signed)
Neurology Consultation ? ?Reason for Consult: CT with left thalamic lacunar infarct  ?Referring Physician: Dr. Tamala Julian ? ?CC: Right upper extremity weakness ? ?History is obtained from: Chart review, patient, patient's family at bedside ? ?HPI: Samantha [name] is a 66 y.o. 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 Initially, patient had weakness of her right arm with some movement but the weakness has progressed to where she is unable to use the right arm at all. Per patient's daughter, on Monday morning the patient walked to the restroom but was unable to get up from the commode. She states that the patient seemed weak in bilateral lower extremities and needed assistance to get back to the bed. Since Monday, she has been sliding from the bed to a bedside commode but unable to ambulate and with right upper extremity weakness. Patient's daughter states that on Wednesday, the patient did develop some confusion with mixing up days of the week and by Friday, she was convinced to be evaluated for her weakness. CT imaging on arrival revealed a left thalamic lacunar infarction and neurology was consulted for further management. Of note, the patient did not have a previous work up for right cerebellar infarction.  ? ?ROS: A complete ROS was performed and is negative except as noted in the HPI.  ? ?No past medical history on file. ?As noted in HPI ? ?Family History  ?Problem Relation Age of Onset  ? Hypertension Mother   ? ?Social History:  ? reports that she has never smoked. She has never used smokeless tobacco. She reports that she does not drink alcohol and does not use  drugs. ? ?Medications ? ?Current Facility-Administered Medications:  ?   stroke: mapping our early stages of recovery book, , Does not apply, Once, Fuller Plan A, MD ?  0.9 %  sodium chloride infusion, , Intravenous, Continuous, Smith, Rondell A, MD ?  0.9 %  sodium chloride infusion, , Intravenous, Continuous, Smith, Rondell A, MD ?  acetaminophen (TYLENOL) tablet 650 mg, 650 mg, Oral, Q4H PRN **OR** acetaminophen (TYLENOL) 160 MG/5ML solution 650 mg, 650 mg, Per Tube, Q4H PRN **OR** acetaminophen (TYLENOL) suppository 650 mg, 650 mg, Rectal, Q4H PRN, Tamala Julian, Rondell A, MD ?  atorvastatin (LIPITOR) tablet 80 mg, 80 mg, Oral, q1800, Smith, Rondell A, MD ?  enoxaparin (LOVENOX) injection 40 mg, 40 mg, Subcutaneous, Q24H, Smith, Rondell A, MD ?  hydrALAZINE (APRESOLINE) injection 10 mg, 10 mg, Intravenous, Q4H PRN, Smith, Rondell A, MD ?  insulin aspart (novoLOG) injection 0-15 Units, 0-15 Units, Subcutaneous, TID WC, Smith, Rondell A, MD ?  insulin aspart (novoLOG) injection 0-5 Units, 0-5 Units, Subcutaneous, QHS, Smith, Rondell A, MD ?  sodium chloride 0.9 % bolus 500 mL, 500 mL, Intravenous, Once, Trifan, Carola Rhine, MD ? ?Current Outpatient Medications:  ?  amLODipine (NORVASC) 10 MG tablet, Take 1 tablet (10 mg total) by mouth daily., Disp: 90 tablet, Rfl: 3 ?  atorvastatin (LIPITOR) 10 MG tablet, Take 1 tablet (10 mg total) by mouth daily., Disp: 90 tablet, Rfl: 3 ?  glimepiride (AMARYL) 4 MG tablet, TAKE 2 TABLETS BY MOUTH ONCE DAILY IN THE MORNING WITH BREAKFAST ., Disp: 60 tablet, Rfl: 11 ?  hydrochlorothiazide (  HYDRODIURIL) 25 MG tablet, Take 1 tablet (25 mg total) by mouth daily., Disp: 90 tablet, Rfl: 3 ?  lisinopril (ZESTRIL) 40 MG tablet, Take 1 tablet by mouth once daily, Disp: 90 tablet, Rfl: 0 ? ?Exam: ?Current vital signs: ?BP 138/62   Pulse 94   Temp 99 ?F (37.2 ?C) (Oral)   Resp 19   SpO2 96%  ?Vital signs in last 24 hours: ?Temp:  [99 ?F (37.2 ?C)] 99 ?F (37.2 ?C) (03/18 1343) ?Pulse Rate:   [93-105] 94 (03/18 1645) ?Resp:  [16-28] 19 (03/18 1645) ?BP: (138-182)/(62-98) 138/62 (03/18 1645) ?SpO2:  [96 %-97 %] 96 % (03/18 1645) ? ?GENERAL: Awake, alert, in no acute distress ?Psych: Affect appropriate for situation, patient is calm and cooperative with examination ?Head: Normocephalic and atraumatic, without obvious abnormality ?EENT: Normal conjunctivae, dry mucous membranes, no OP obstruction ?LUNGS: Normal respiratory effort. Non-labored breathing on room air ?CV: Regular rate and rhythm on telemetry ?ABDOMEN: Soft, non-tender, non-distended ?Extremities: Warm, well perfused, without obvious deformity ? ?NEURO:  ?Mental Status: Awake, alert, and oriented to person, place, time, and situation. ?She is able to provide a some details regarding her history of present illness with some intermittent confusion during examination.  ?Speech/Language: speech is fluent without obvious dysarthria or aphasia.  ?She does at times have trouble following her train of thought during conversation.  ?No neglect is noted ?Cranial Nerves:  ?II: PERRL 4 mm/brisk. Visual fields full.  ?III, IV, VI: EOMI without ptosis, nystagmus, or gaze preference.  ?V: Sensation is intact to light touch and symmetrical to face.  ?VII: Face with decreased NL fold on the right.  ?VIII: Hearing is intact to voice ?IX, X: Palate elevation is symmetric. Phonation normal.  ?XI: Normal sternocleidomastoid and trapezius muscle strength ?XII: Tongue protrudes midline without fasciculations.   ?Motor: 5/5 strength present in left upper extremity and bilateral lower extremities without vertical drift.  ?Patient's right upper extremity is flaccid on exam.  ?Tone is normal. Bulk is normal.  ?Sensation: Intact to light touch bilaterally in all four extremities. No extinction to DSS present.  ?Coordination: FTN intact on the left, unable to assess on the right due to flaccid paralysis. HKS intact bilaterally. ?Gait: Deferred ? ?NIHSS: ?1a Level of  Conscious.: 0 ?1b LOC Questions: 0 ?1c LOC Commands: 0 ?2 Best Gaze: 0 ?3 Visual: 0 ?4 Facial Palsy: 0 ?5a Motor Arm - left: 0 ?5b Motor Arm - Right: 4 ?6a Motor Leg - Left: 0 ?6b Motor Leg - Right: 0 ?7 Limb Ataxia: 0 ?8 Sensory: 0 ?9 Best Language: 0 ?10 Dysarthria: 0 ?11 Extinct. and Inatten.: 0 ?TOTAL: 4 ? ?Labs ?I have reviewed labs in epic and the results pertinent to this consultation are: ?CBC ?   ?Component Value Date/Time  ? WBC 6.8 01/13/2022 1347  ? RBC 4.46 01/13/2022 1347  ? HGB 14.3 01/13/2022 1347  ? HGB 13.2 11/18/2019 1607  ? HCT 44.1 01/13/2022 1347  ? HCT 40.0 11/18/2019 1607  ? PLT 151 01/13/2022 1347  ? PLT 199 11/18/2019 1607  ? MCV 98.9 01/13/2022 1347  ? MCV 97 11/18/2019 1607  ? MCH 32.1 01/13/2022 1347  ? MCHC 32.4 01/13/2022 1347  ? RDW 12.0 01/13/2022 1347  ? RDW 11.8 11/18/2019 1607  ? LYMPHSABS 1.3 01/13/2022 1347  ? LYMPHSABS 2.2 11/18/2019 1607  ? MONOABS 0.5 01/13/2022 1347  ? EOSABS 0.1 01/13/2022 1347  ? EOSABS 0.4 11/18/2019 1607  ? BASOSABS 0.1 01/13/2022 1347  ? BASOSABS 0.0  11/18/2019 1607  ? ?CMP  ?   ?Component Value Date/Time  ? NA 134 (L) 01/13/2022 1347  ? NA 138 11/18/2019 1607  ? K 4.8 01/13/2022 1347  ? CL 95 (L) 01/13/2022 1347  ? CO2 29 01/13/2022 1347  ? GLUCOSE 494 (H) 01/13/2022 1347  ? BUN 25 (H) 01/13/2022 1347  ? BUN 13 11/18/2019 1607  ? CREATININE 1.26 (H) 01/13/2022 1347  ? CALCIUM 9.4 01/13/2022 1347  ? PROT 7.2 11/18/2019 1607  ? ALBUMIN 4.0 11/18/2019 1607  ? AST 16 11/18/2019 1607  ? ALT 10 11/18/2019 1607  ? ALKPHOS 86 11/18/2019 1607  ? BILITOT 0.3 11/18/2019 1607  ? GFRNONAA 48 (L) 01/13/2022 1347  ? GFRAA 84 11/18/2019 1607  ? ?Lipid Panel  ?   ?Component Value Date/Time  ? CHOL 184 01/13/2022 1347  ? CHOL 234 (H) 11/18/2019 1607  ? TRIG 82 01/13/2022 1347  ? HDL 53 01/13/2022 1347  ? HDL 64 11/18/2019 1607  ? CHOLHDL 3.5 01/13/2022 1347  ? VLDL 16 01/13/2022 1347  ? [place] 115 (H) 01/13/2022 1347  ? LDLCALC 153 (H) 11/18/2019 1607  ? ?Lab Results   ?Component Value Date  ? HGBA1C 11.5 (A) 06/23/2021  ? ?Imaging ?I have reviewed the images obtained: ? ?CT-scan of the brain 3/18: ?1. New white matter changes on the left primarily involving the corona radiata, abutting the body of

## 2022-01-13 NOTE — ED Provider Notes (Signed)
[facility] EMERGENCY DEPARTMENT Provider Note   CSN: [id number] Arrival date & time: 01/13/22  1306     History  Chief Complaint  Patient presents with   Stroke Symptoms    Samantha [name] is a 66 y.o. female presenting to ED with right arm flaccid paralysis.  Her husband provides supplemental history.  They report the onset of symptoms approximately 5 days ago.  She has had progressive weakening of her right arm and then complete loss of function may be a few days ago per their report.  Definitely more than 24 hours per their report.  Husband believes the patient is mentating and speaking normally at her baseline.  She does denies any numbness or weakness of the right leg or the face.  She denies prior history of stroke or TIA.  She does have a history of diabetes which appears poorly controlled, as well as high cholesterol and hypertension.  She is here with her husband and daughter at bedside  HPI     Home Medications Prior to Admission medications   Medication Sig Start Date End Date Taking? Authorizing Provider  amLODipine (NORVASC) 10 MG tablet Take 1 tablet (10 mg total) by mouth daily. 06/23/21  Yes Chrismon, Jodell Cipro, PA-C  atorvastatin (LIPITOR) 10 MG tablet Take 1 tablet (10 mg total) by mouth daily. 06/23/21  Yes Chrismon, Jodell Cipro, PA-C  glimepiride (AMARYL) 4 MG tablet TAKE 2 TABLETS BY MOUTH ONCE DAILY IN THE MORNING WITH BREAKFAST . 06/23/21  Yes Chrismon, Jodell Cipro, PA-C  hydrochlorothiazide (HYDRODIURIL) 25 MG tablet Take 1 tablet (25 mg total) by mouth daily. 06/23/21  Yes Chrismon, Jodell Cipro, PA-C  lisinopril (ZESTRIL) 40 MG tablet Take 1 tablet by mouth once daily 06/08/21  Yes Bacigalupo, Marzella Schlein, MD      Allergies    Penicillins    Review of Systems   Review of Systems  Physical Exam Updated Vital Signs BP (!) 147/78   Pulse 93   Temp 99 F (37.2 C) (Oral)   Resp (!) 28   SpO2 97%  Physical Exam Constitutional:      General: She is not  in acute distress. HENT:     Head: Normocephalic and atraumatic.  Eyes:     Conjunctiva/sclera: Conjunctivae normal.     Pupils: Pupils are equal, round, and reactive to light.  Cardiovascular:     Rate and Rhythm: Normal rate and regular rhythm.  Pulmonary:     Effort: Pulmonary effort is normal. No respiratory distress.  Abdominal:     General: There is no distension.     Tenderness: There is no abdominal tenderness.  Skin:    General: Skin is warm and dry.  Neurological:     General: No focal deficit present.     Mental Status: She is alert. Mental status is at baseline.     GCS: GCS eye subscore is 4. GCS verbal subscore is 5. GCS motor subscore is 6.     Cranial Nerves: Cranial nerves 2-12 are intact.     Comments: Paresthesias of the entire right arm 1 out of 5 strength in right arm 5/5 strength in right leg and remainder of extremities  Psychiatric:        Mood and Affect: Mood normal.        Behavior: Behavior normal.    ED Results / Procedures / Treatments   Labs (all labs ordered are listed, but only abnormal results are displayed) Labs Reviewed  BASIC  METABOLIC PANEL - Abnormal; Notable for the following components:      Result Value   Sodium 134 (*)    Chloride 95 (*)    Glucose, Bld 494 (*)    BUN 25 (*)    Creatinine, Ser 1.26 (*)    GFR, Estimated 48 (*)    All other components within normal limits  LIPID PANEL - Abnormal; Notable for the following components:   LDL Cholesterol 115 (*)    All other components within normal limits  CBG MONITORING, ED - Abnormal; Notable for the following components:   Glucose-Capillary 444 (*)    All other components within normal limits  TROPONIN I (HIGH SENSITIVITY) - Abnormal; Notable for the following components:   Troponin I (High Sensitivity) 25 (*)    All other components within normal limits  RESP PANEL BY RT-PCR (FLU A&B, COVID) ARPGX2  CBC WITH DIFFERENTIAL/PLATELET  PROTIME-INR  URINALYSIS, ROUTINE W  REFLEX MICROSCOPIC  TROPONIN I (HIGH SENSITIVITY)    EKG EKG Interpretation  Date/Time:  Saturday January 13 2022 14:30:49 EDT Ventricular Rate:  95 PR Interval:  144 QRS Duration: 84 QT Interval:  356 QTC Calculation: 448 R Axis:   -1 Text Interpretation: Sinus rhythm Left ventricular hypertrophy Nonspecific T abnormalities, lateral leads Confirmed by Alvester Chou [phone]) on 01/13/2022 4:49:47 PM  Radiology CT HEAD WO CONTRAST ( )  Result Date: 01/13/2022 CLINICAL DATA:  Stroke follow-up. Right arm paralysis. The right-sided weakness began January 08, 2022. Altered gait. EXAM: CT HEAD WITHOUT CONTRAST TECHNIQUE: Contiguous axial images were obtained from the base of the skull through the vertex without intravenous contrast. RADIATION DOSE REDUCTION: This exam was performed according to the departmental dose-optimization program which includes automated exposure control, adjustment of the mA and/or kV according to patient size and/or use of iterative reconstruction technique. COMPARISON:  September 23, 2019 CT scan of the brain FINDINGS: Brain: No subdural, epidural, or subarachnoid hemorrhage identified. The patient's known right cerebellar infarct is chronic in appearance. The remainder of the cerebellum is normal in appearance. The brainstem and basal cisterns are normal. Ventricles and sulci are stable. No mass effect or midline shift. Periventricular white matter changes are again identified, stable on the right but increased on the left in the interval. The increased white matter changes on the left involve the coronal radiata and abut the body of the left caudate. The overlying cortex is intact. There is a lacunar infarct in the left thalamus not identified previously as seen on series 4, image 16. No other evidence of acute ischemia or infarct. Ventricles and sulci are stable. Vascular: Calcified atherosclerosis is seen in the intracranial carotids. Skull: Normal. Negative for fracture or  focal lesion. Sinuses/Orbits: No acute finding. Other: None. IMPRESSION: 1. New white matter changes on the left primarily involving the corona radiata, abutting the body of the left caudate. There is also a lacunar infarct in the left thalamus not identified on the previous study. The overlying cortex is intact. These findings likely are the cause of the patient's right-sided symptoms. 2. No other acute abnormalities. Electronically Signed   By: Gerome Sam III M.D.   On: 01/13/2022 16:15    Procedures Procedures    Medications Ordered in ED Medications  sodium chloride 0.9 % bolus 500 mL (has no administration in time range)    ED Course/ Medical Decision Making/ A&P Clinical Course as of 01/13/22 1658  Sat Jan 13, 2022  1622 Lacunar infarct on CT - will page neurology, admit  for stroke w/u.  Pt and family udpated. [MT]  1627 Pt and family updated regarding stroke diagnosis [MT]  1633 Dr Amada Jupiter neurology made aware of stroke, will consult on patient.  Pending medical admission for stroke w/u. [MT]  1650 Admitted to hospitalist [MT]  1657 IV lost at CTA.  Will need reconsult to IV team.  [MT]    Clinical Course User Index [MT] Huda Petrey, Kermit Balo, MD                           Medical Decision Making Amount and/or Complexity of Data Reviewed Labs: ordered. Radiology: ordered. ECG/medicine tests: ordered.  Risk Decision regarding hospitalization.   This patient presents to the ED with concern for right arm weakness. This involves an extensive number of treatment options, and is a complaint that carries with it a high risk of complications and morbidity.  The differential diagnosis includes, highest risk for stroke with her risk factors, poorly controlled diabetes, and this clinical presentation.  I have a lower suspicion for neck injury with no neck pain or radiculopathy.  Eliquis have a low suspicion for aortic dissection or atypical ACS given that she has no chest pain or  discomfort and is well-appearing otherwise on exam.  Co-morbidities that complicate the patient evaluation: Hypertension, high cholesterol, obesity, diabetes, risk factors for stroke  Additional history obtained from patient's husband and daughter at the bedside  The patient is unfortunately outside the window of intervention given that the symptoms have been ongoing for 3 to 5 days.  I ordered and personally interpreted labs.  The pertinent results include:   COVID and flu negative.  BMP showing hyperglycemia but no anion gap.  Electrolytes closely unremarkable.   I ordered imaging studies including CT head, CTA head and neck I independently visualized and interpreted imaging which showed left lacunar infarct.  CTA unable to be completed due to loss of IV access I agree with the radiologist interpretation  The patient was maintained on a cardiac monitor.  I personally viewed and interpreted the cardiac monitored which showed an underlying rhythm of: Sinus rhythm  Per my interpretation the patient's ECG shows sinus rhythm without acute ischemic findings  I ordered medication including IV fluids for hydration  Test Considered: Doubt meningitis, do not feel that LP clinically indicated at this time  I requested consultation with the neurology,  and discussed lab and imaging findings as well as pertinent plan - they recommend: MRI imaging, admission for stroke evaluation  After the interventions noted above, I reevaluated the patient and found that they have: stayed the same   Dispostion:  After consideration of the diagnostic results and the patients response to treatment, I feel that the patent would benefit from medical admission.         Final Clinical Impression(s) / ED Diagnoses Final diagnoses:  Cerebrovascular accident (CVA), unspecified mechanism (HCC)  Hyperglycemia    Rx / DC Orders ED Discharge Orders     None         Tylee Newby, Kermit Balo, MD 01/13/22  1659

## 2022-01-13 NOTE — Assessment & Plan Note (Addendum)
Renal US without hydro, L kidney smaller than R with lobular contour ?Creatinine appears back to baseline ?Currently holding HCTZ and lisinopril ? ?

## 2022-01-13 NOTE — Assessment & Plan Note (Addendum)
Patient presenting with Samantha [name] 5-day history of paralysis of her right arm/leg, slurred speech, gait disturbance, and confusion.   ?CT head with new white matter changes on L primarily involving the corona radiata, abutting the body of the L caudate, lacunar infarct in l thalamus ?CTA head/neck with occlusion vs severe stenosis of proximal R M2 MCA branch with distal reconstitution, severe proximal L M2MCA stenosis, moderate stenossi of L vertebral artery at dural margin, mild to moderate bilateral intracranial ICA and bilateral P2 PCA stenoses ?MRI brain with acute white matter infarct of L corona radiata, superimposed punctate acute on chronic R cerebellar SCA territory ischemia, chronic L thalamic lacune ?MRA head with advanced intracranial atherosclerosis ?TTE: LVEF 55-60%, no regional wall motion abnormalities, IVC normal, no interatrial shunt visualized Hemoglobin A1c 13.1, LDL 115.   ?Neurology was consulted and followed during initial hospital course, now signed off. ?--DAPT w/ Aspirin 325 mg p.o. daily and Plavix 75 mg p.o. daily x 3 months followed by aspirin alone for severe intracranial stenosis. ?--Atorvastatin 80 mg p.o. daily ?--PT/OT/SLP: Recommend CIR, pending insurance authorization -> plan for CiR today ?--Continue to monitor on telemetry ? ? ?

## 2022-01-13 NOTE — H&P (Addendum)
?History and Physical  ? ? ?Patient: Samantha [name] [id number] DOB: 1956-03-07 ?DOA: 01/13/2022 ?DOS: the patient was seen and examined on 01/13/2022 ?PCP: Trinna Post, PA-C (Inactive)  ?Patient coming from: Home ? ?Chief Complaint:  ?Chief Complaint  ?Patient presents with  ? Stroke Symptoms  ? ?HPI: Samantha [name] is a 66 y.o. female with medical history significant of hypertension, hyperlipidemia, diabetes mellitus type II who presents with a 5-day history of right upper extremity weakness.  Her daughter is present at bedside and helps provide additional history as the patient appears to be confused at times.  At baseline patient had been able to ambulate without assistance and was normally alert and oriented.  Since symptoms started patient has had progressively worsening weakness of the right arm until she has not been able to use it at all over the last couple of days.  Her daughter notes that the patient's speech seems to be clear when she initially starts talking, but slurs near the end of her sentences.  Also notes that the patient has been mixing up things like right and left.  Since symptoms started patient has been laying in bed, and seems to be unsteady on her feet when ambulating which is new.  Denies having any palpitations, headache, chest pain, nausea, vomiting, diarrhea symptoms. ? ?On admission into the emergency department patient was noted to have temperature of 99 ?F, pulse 93-1 05, respirations 16-28, blood pressures elevated up to 182/98, and O2 saturations currently maintained on room air.  CT imaging noted new white matter changes on the left primarily involving the corona radiata abutting the body of the left caudate, and a lacunar infarct of the left thalamus.  Labs significant for sodium 134, CO2 29, BUN 25, creatinine 1.26, glucose 494, anion gap 10, high-sensitivity troponin 25, and LDL 115.  Neurology have been consulted and recommended completion of stroke work-up.  Patient was  not a candidate for tPA as she was out of the window.  CTA of the head and neck was ordered, but delayed as patient's IV blew. ? ?Review of Systems: As mentioned in the history of present illness. All other systems reviewed and are negative. ?No past medical history on file. ?Past Surgical History:  ?Procedure Laterality Date  ? TUBAL LIGATION    ? 23 yrs ago  ? ?Social History:  reports that she has never smoked. She has never used smokeless tobacco. She reports that she does not drink alcohol and does not use drugs. ? ?No Known Allergies ? ?Family History  ?Problem Relation Age of Onset  ? Hypertension Mother   ? ? ?Prior to Admission medications   ?Medication Sig Start Date End Date Taking? Authorizing Provider  ?amLODipine (NORVASC) 10 MG tablet Take 1 tablet (10 mg total) by mouth daily. 06/23/21   Chrismon, Vickki Muff, PA-C  ?atorvastatin (LIPITOR) 10 MG tablet Take 1 tablet (10 mg total) by mouth daily. 06/23/21   Chrismon, Vickki Muff, PA-C  ?glimepiride (AMARYL) 4 MG tablet TAKE 2 TABLETS BY MOUTH ONCE DAILY IN THE MORNING WITH BREAKFAST . 06/23/21   Chrismon, Vickki Muff, PA-C  ?hydrochlorothiazide (HYDRODIURIL) 25 MG tablet Take 1 tablet (25 mg total) by mouth daily. 06/23/21   Chrismon, Vickki Muff, PA-C  ?lisinopril (ZESTRIL) 40 MG tablet Take 1 tablet by mouth once daily 06/08/21   Virginia Crews, MD  ? ? ?Physical Exam: ?Vitals:  ? 01/13/22 1343 01/13/22 1500 01/13/22 1515  ?BP: (!) 182/98 (!) 156/76 (!) 147/78  ?Pulse: Marland Kitchen)  105 96 93  ?Resp: 16 (!) 26 (!) 28  ?Temp: 99 ?F (37.2 ?C)    ?TempSrc: Oral    ?SpO2: 97% 97% 97%  ? ? ?Constitutional: Morbidly obese female currently in no acute distress ?Eyes: PERRL, lids and conjunctivae normal ?ENMT: Mucous membranes are moist.   ?Neck: normal, supple, no masses, no thyromegaly ?Respiratory: clear to auscultation bilaterally, no wheezing, no crackles.   ?Cardiovascular: Regular rate and rhythm, no murmurs / rubs / gallops. 2+ pedal pulses.   ?Abdomen: no tenderness,  no masses palpated.  Bowel sounds positive.  ?Musculoskeletal: no clubbing / cyanosis. No joint deformity upper and lower extremities. Good ROM, no contractures. Normal muscle tone.  ?Skin: no rashes, lesions, ulcers. No induration ?Neurologic: CN 2-12 grossly intact.  Right upper extremity is flaccid.  Patient's speech sounds mildly slurred at times. ?Psychiatric: Normal judgment and insight. Alert and oriented x person and place, but does still intermittently confused. ? ?Data Reviewed: ? ? ?Sinus rhythm at 95 bpm with LVH.  Reviewed patient's labs and imaging as noted above in HPI. ?Assessment and Plan: ?* CVA (cerebral vascular accident) ([place]) ?Patient presents with a 5-day history of paralysis of her right arm, slurring of her speech at times, gait disturbance, and confusion.  CT imaging concerning for new white matter changes on the left primarily involving the corona radiata abutting the body of the left caudate, and a lacunar infarct of the left thalamus.  She was out of the window for tPA.  Neurology was consulted.  Risk factors include hypertension, hyperlipidemia, poorly controlled DM type II, and obesity. ?-Admit to telemetry bed ?-Stroke order set initiated ?-Neuro checks ?-Check  MRI brain ?-Check CTA of the head and neck ?-Check echocardiogram ?-Check Hemoglobin A1c   ?-PT/OT/Speech to eval and treat ?-ASA and Plavix ?-Appreciate neurology consultative services, will follow-up  ?-Social work consult  ? ?Type 2 diabetes mellitus without complication, without long-term current use of insulin ([place]) ?Patient is initial glucose elevated at 494 without elevated anion gap.  Last available hemoglobin A1c was 11.5 in 05/2021 home medication regimen includes Amaryl 4 mg daily. ?-Hypoglycemic protocols ?-Follow-up hemoglobin A1c ?-Hold Amaryl ?-Start Semglee 10 units daily ?-CBGs before every meal with moderate sliding scale insulin ?-Diabetic education and dietitian consult ? ?AKI (acute kidney injury)  ([place]) ?Acute.  Creatinine elevated at 1.26 with BUN 25.  Baseline creatinine previously had been within normal limits back in 2021.  Given hyperglycemia suspect prerenal cause of symptoms. ?-Check urinalysis ?-Normal saline IV fluids at 100 mL/h ?-Recheck kidney function in a.m. ? ?Hypertension ?On admission blood pressures elevated up to 182/98.  Home blood pressure regimen includes amlodipine 10 mg daily, hydrochlorothiazide 25 mg daily, and lisinopril 40 mg daily. ?-Continue amlodipine ?-Held hydrochlorothiazide and lisinopril due to acute kidney injury ?-Hydralazine IV as needed for elevated blood pressure ? ?Elevated troponin ?Acute.  High-sensitivity troponin elevated at 25.  Patient did not report any complaints of chest pain. ? ?Hyponatremia ?Acute.  Sodium just mildly low at 134.  This appears to be a pseudohyponatremia secondary to patient's hyperglycemia. ?-Continue to monitor ? ?Hyperlipidemia associated with type 2 diabetes mellitus ([place]) ?On admission LDL 115.  Home medication regimen included atorvastatin 10 mg daily. ?-Goal LDL less than 70 ?-Increased atorvastatin to 80 mg daily ? ? ? ? ? Advance Care Planning:   Code Status: Full Code   ? ?Consults: Neurology ? ?Family Communication: Family updated at bedside ? ?Severity of Illness: ?The appropriate patient status for this  patient is INPATIENT. Inpatient status is judged to be reasonable and necessary in order to provide the required intensity of service to ensure the patient's safety. The patient's presenting symptoms, physical exam findings, and initial radiographic and laboratory data in the context of their chronic comorbidities is felt to place them at high risk for further clinical deterioration. Furthermore, it is not anticipated that the patient will be medically stable for discharge from the hospital within 2 midnights of admission.  ? ?* I certify that at the point of admission it is my clinical judgment that the patient will require  inpatient hospital care spanning beyond 2 midnights from the point of admission due to high intensity of service, high risk for further deterioration and high frequency of surveillance required.* ? ?Author: ?Mellissa Kohut

## 2022-01-13 NOTE — Assessment & Plan Note (Addendum)
On admission blood pressures elevated up to 182/98.  Home blood pressure regimen includes amlodipine 10 mg daily, hydrochlorothiazide 25 mg daily, and lisinopril 40 mg daily. ?--continue amlodipine ?--holding HCTZ and lisinopril due to AKI --- consider resumption if needed going forward ? ?

## 2022-01-14 ENCOUNTER — Inpatient Hospital Stay (HOSPITAL_COMMUNITY): Payer: 59

## 2022-01-14 ENCOUNTER — Other Ambulatory Visit: Payer: Self-pay

## 2022-01-14 DIAGNOSIS — I639 Cerebral infarction, unspecified: Secondary | ICD-10-CM

## 2022-01-14 DIAGNOSIS — I6389 Other cerebral infarction: Secondary | ICD-10-CM

## 2022-01-14 DIAGNOSIS — N179 Acute kidney failure, unspecified: Secondary | ICD-10-CM

## 2022-01-14 DIAGNOSIS — I10 Essential (primary) hypertension: Secondary | ICD-10-CM

## 2022-01-14 DIAGNOSIS — E871 Hypo-osmolality and hyponatremia: Secondary | ICD-10-CM

## 2022-01-14 DIAGNOSIS — E1169 Type 2 diabetes mellitus with other specified complication: Secondary | ICD-10-CM

## 2022-01-14 DIAGNOSIS — E785 Hyperlipidemia, unspecified: Secondary | ICD-10-CM

## 2022-01-14 DIAGNOSIS — I63513 Cerebral infarction due to unspecified occlusion or stenosis of bilateral middle cerebral arteries: Secondary | ICD-10-CM

## 2022-01-14 DIAGNOSIS — E119 Type 2 diabetes mellitus without complications: Secondary | ICD-10-CM

## 2022-01-14 DIAGNOSIS — R778 Other specified abnormalities of plasma proteins: Secondary | ICD-10-CM

## 2022-01-14 LAB — GLUCOSE, CAPILLARY
Glucose-Capillary: 217 mg/dL — ABNORMAL HIGH (ref 70–99)
Glucose-Capillary: 221 mg/dL — ABNORMAL HIGH (ref 70–99)
Glucose-Capillary: 256 mg/dL — ABNORMAL HIGH (ref 70–99)
Glucose-Capillary: 380 mg/dL — ABNORMAL HIGH (ref 70–99)
Glucose-Capillary: 411 mg/dL — ABNORMAL HIGH (ref 70–99)
Glucose-Capillary: 420 mg/dL — ABNORMAL HIGH (ref 70–99)

## 2022-01-14 LAB — ECHOCARDIOGRAM COMPLETE
Area-P 1/2: 4.86 cm2
Height: 60 in
S' Lateral: 3 cm

## 2022-01-14 LAB — COMPREHENSIVE METABOLIC PANEL
ALT: 16 U/L (ref 0–44)
AST: 16 U/L (ref 15–41)
Albumin: 2.8 g/dL — ABNORMAL LOW (ref 3.5–5.0)
Alkaline Phosphatase: 75 U/L (ref 38–126)
Anion gap: 11 (ref 5–15)
BUN: 27 mg/dL — ABNORMAL HIGH (ref 8–23)
CO2: 25 mmol/L (ref 22–32)
Calcium: 8.8 mg/dL — ABNORMAL LOW (ref 8.9–10.3)
Chloride: 95 mmol/L — ABNORMAL LOW (ref 98–111)
Creatinine, Ser: 1.42 mg/dL — ABNORMAL HIGH (ref 0.44–1.00)
GFR, Estimated: 41 mL/min — ABNORMAL LOW (ref >60)
Glucose, Bld: 399 mg/dL — ABNORMAL HIGH (ref 70–99)
Potassium: 4.4 mmol/L (ref 3.5–5.1)
Sodium: 131 mmol/L — ABNORMAL LOW (ref 135–145)
Total Bilirubin: 0.5 mg/dL (ref 0.3–1.2)
Total Protein: 6.3 g/dL — ABNORMAL LOW (ref 6.5–8.1)

## 2022-01-14 LAB — TROPONIN I (HIGH SENSITIVITY)
Troponin I (High Sensitivity): 37 ng/L — ABNORMAL HIGH (ref <18)
Troponin I (High Sensitivity): 29 ng/L — ABNORMAL HIGH (ref <18)

## 2022-01-14 LAB — CBC
HCT: 36.5 % (ref 36.0–46.0)
Hemoglobin: 12 g/dL (ref 12.0–15.0)
MCH: 31.9 pg (ref 26.0–34.0)
MCHC: 32.9 g/dL (ref 30.0–36.0)
MCV: 97.1 fL (ref 80.0–100.0)
Platelets: 181 10*3/uL (ref 150–400)
RBC: 3.76 MIL/uL — ABNORMAL LOW (ref 3.87–5.11)
RDW: 12 % (ref 11.5–15.5)
WBC: 8 10*3/uL (ref 4.0–10.5)
nRBC: 0 % (ref 0.0–0.2)

## 2022-01-14 LAB — HEMOGLOBIN A1C
Hgb A1c MFr Bld: 13.1 % — ABNORMAL HIGH (ref 4.8–5.6)
Mean Plasma Glucose: 329.27 mg/dL

## 2022-01-14 LAB — HIV ANTIBODY (ROUTINE TESTING W REFLEX): HIV Screen 4th Generation wRfx: NONREACTIVE

## 2022-01-14 MED ORDER — LIVING WELL WITH DIABETES BOOK
Freq: Once | Status: AC
  Filled 2022-01-14: qty 1

## 2022-01-14 MED ORDER — SODIUM CHLORIDE 0.9 % IV BOLUS
1000.0000 mL | Freq: Once | INTRAVENOUS | Status: AC
  Administered 2022-01-14: 1000 mL via INTRAVENOUS

## 2022-01-14 MED ORDER — INSULIN GLARGINE-YFGN 100 UNIT/ML ~~LOC~~ SOLN
25.0000 [IU] | Freq: Every day | SUBCUTANEOUS | Status: DC
  Administered 2022-01-14: 25 [IU] via SUBCUTANEOUS
  Filled 2022-01-14 (×2): qty 0.25

## 2022-01-14 MED ORDER — SODIUM CHLORIDE 0.9 % IV SOLN
INTRAVENOUS | Status: DC
Start: 2022-01-14 — End: 2022-01-15

## 2022-01-14 MED ORDER — INSULIN STARTER KIT- PEN NEEDLES (ENGLISH)
1.0000 | Freq: Once | Status: AC
  Administered 2022-01-16: 1
  Filled 2022-01-14: qty 1

## 2022-01-14 NOTE — Progress Notes (Signed)
Inpatient Diabetes Program Recommendations ? ?AACE/ADA: New Consensus Statement on Inpatient Glycemic Control (2015) ? ?Target Ranges:  Prepandial:   less than 140 mg/dL ?     Peak postprandial:   less than 180 mg/dL (1-2 hours) ?     Critically ill patients:  140 - 180 mg/dL  ? ?Lab Results  ?Component Value Date  ? GLUCAP 256 (H) 01/14/2022  ? HGBA1C 13.1 (H) 01/14/2022  ? ? ?Review of Glycemic Control ? ?Diabetes history: DM2 ?Outpatient Diabetes medications: Amaryl 4 mg qd ?Current orders for Inpatient glycemic control: Semglee 25 units qd, Novolog 0-15 units tid correction + hs 0-5 units ? ?Inpatient Diabetes Program Recommendations:   ?Received consult regarding diabetes education for patient and family. Ordered living well with diabetes booklet and starter insulin pen kit. ?Will plan  to followup with patient tomorrow. ? ?Thank you, ?Nani Gasser Makael Stein, RN, MSN, CDE  ?Diabetes Coordinator ?Inpatient Glycemic Control Team ?Team Pager [phone] (8am-5pm) ?01/14/2022 2:13 PM ? ? ? ? ?

## 2022-01-14 NOTE — Plan of Care (Signed)
  Problem: Education: Goal: Knowledge of General Education information will improve Description: Including pain rating scale, medication(s)/side effects and non-pharmacologic comfort measures Outcome: Progressing   Problem: Health Behavior/Discharge Planning: Goal: Ability to manage health-related needs will improve Outcome: Progressing   Problem: Clinical Measurements: Goal: Ability to maintain clinical measurements within normal limits will improve Outcome: Progressing Goal: Will remain free from infection Outcome: Progressing Goal: Diagnostic test results will improve Outcome: Progressing Goal: Respiratory complications will improve Outcome: Progressing Goal: Cardiovascular complication will be avoided Outcome: Progressing   Problem: Activity: Goal: Risk for activity intolerance will decrease Outcome: Progressing   Problem: Nutrition: Goal: Adequate nutrition will be maintained Outcome: Progressing   Problem: Coping: Goal: Level of anxiety will decrease Outcome: Progressing   Problem: Elimination: Goal: Will not experience complications related to bowel motility Outcome: Progressing Goal: Will not experience complications related to urinary retention Outcome: Progressing   Problem: Pain Managment: Goal: General experience of comfort will improve Outcome: Progressing   Problem: Safety: Goal: Ability to remain free from injury will improve Outcome: Progressing   Problem: Skin Integrity: Goal: Risk for impaired skin integrity will decrease Outcome: Progressing   Problem: Education: Goal: Knowledge of disease or condition will improve Outcome: Progressing Goal: Knowledge of secondary prevention will improve (SELECT ALL) Outcome: Progressing Goal: Knowledge of patient specific risk factors will improve (INDIVIDUALIZE FOR PATIENT) Outcome: Progressing Goal: Individualized Educational Video(s) Outcome: Progressing   Problem: Coping: Goal: Will verbalize  positive feelings about self Outcome: Progressing Goal: Will identify appropriate support needs Outcome: Progressing   Problem: Health Behavior/Discharge Planning: Goal: Ability to manage health-related needs will improve Outcome: Progressing   Problem: Self-Care: Goal: Ability to participate in self-care as condition permits will improve Outcome: Progressing Goal: Verbalization of feelings and concerns over difficulty with self-care will improve Outcome: Progressing Goal: Ability to communicate needs accurately will improve Outcome: Progressing   Problem: Nutrition: Goal: Risk of aspiration will decrease Outcome: Progressing Goal: Dietary intake will improve Outcome: Progressing   Problem: Ischemic Stroke/TIA Tissue Perfusion: Goal: Complications of ischemic stroke/TIA will be minimized Outcome: Progressing   

## 2022-01-14 NOTE — Progress Notes (Signed)
RN notified by CCMD of ST elevation that appeared to be increased from last EKG at 1430. Pt denies CP, SOB or other symptoms at this time. Dr. Loney Loh notified. EKG completed. New orders placed. ?

## 2022-01-14 NOTE — Progress Notes (Signed)
STROKE TEAM PROGRESS NOTE  ? ?SUBJECTIVE (INTERVAL HISTORY) ?No is at the bedside.  Overall her condition is unchanged.  Patient sitting in chair, still has right sided weakness.  PT/OT remain CIR. ? ?OBJECTIVE ?Temp:  [97.5 ?F (36.4 ?C)-99 ?F (37.2 ?C)] 99 ?F (37.2 ?C) (03/19 1556) ?Pulse Rate:  [84-96] 88 (03/19 1556) ?Cardiac Rhythm: Normal sinus rhythm (03/19 0700) ?Resp:  [14-20] 16 (03/19 1556) ?BP: (119-169)/(63-92) 158/73 (03/19 1556) ?SpO2:  [94 %-98 %] 94 % (03/19 1556) ? ?Recent Labs  ?Lab 01/14/22 ?0002 01/14/22 ?0645 01/14/22 ?[id number] 01/14/22 ?1319 01/14/22 ?1758  ?GLUCAP 420* 380* 411* 256* 221*  ? ?Recent Labs  ?Lab 01/13/22 ?1347 01/14/22 ?0216  ?NA 134* 131*  ?K 4.8 4.4  ?CL 95* 95*  ?CO2 29 25  ?GLUCOSE 494* 399*  ?BUN 25* 27*  ?CREATININE 1.26* 1.42*  ?CALCIUM 9.4 8.8*  ? ?Recent Labs  ?Lab 01/14/22 ?0216  ?AST 16  ?ALT 16  ?ALKPHOS 75  ?BILITOT 0.5  ?PROT 6.3*  ?ALBUMIN 2.8*  ? ?Recent Labs  ?Lab 01/13/22 ?1347 01/14/22 ?0216  ?WBC 6.8 8.0  ?NEUTROABS 4.8  --   ?HGB 14.3 12.0  ?HCT 44.1 36.5  ?MCV 98.9 97.1  ?PLT 151 181  ? ?No results for input(s): CKTOTAL, CKMB, CKMBINDEX, TROPONINI in the last 168 hours. ?Recent Labs  ?  01/13/22 ?1347  ?LABPROT 13.9  ?INR 1.1  ? ?No results for input(s): COLORURINE, LABSPEC, [place], GLUCOSEU, HGBUR, BILIRUBINUR, KETONESUR, PROTEINUR, UROBILINOGEN, NITRITE, LEUKOCYTESUR in the last 72 hours. ? ?Invalid input(s): APPERANCEUR  ?   ?Component Value Date/Time  ? CHOL 184 01/13/2022 1347  ? CHOL 234 (H) 11/18/2019 1607  ? TRIG 82 01/13/2022 1347  ? HDL 53 01/13/2022 1347  ? HDL 64 11/18/2019 1607  ? CHOLHDL 3.5 01/13/2022 1347  ? VLDL 16 01/13/2022 1347  ? [place] 115 (H) 01/13/2022 1347  ? LDLCALC 153 (H) 11/18/2019 1607  ? ?Lab Results  ?Component Value Date  ? HGBA1C 13.1 (H) 01/14/2022  ? ?No results found for: LABOPIA, COCAINSCRNUR, [place], [place], [place], [place]  ?No results for input(s): ETH in the last 168 hours. ? ?I have personally reviewed the  radiological images below and agree with the radiology interpretations. ? ?CT ANGIO HEAD NECK W WO CM ? ?Result Date: 01/13/2022 ?CLINICAL DATA:  Right-sided weakness. EXAM: CT ANGIOGRAPHY HEAD AND NECK TECHNIQUE: Multidetector CT imaging of the head and neck was performed using the standard protocol during bolus administration of intravenous contrast. Multiplanar CT image reconstructions and MIPs were obtained to evaluate the vascular anatomy. Carotid stenosis measurements (when applicable) are obtained utilizing NASCET criteria, using the distal internal carotid diameter as the denominator. RADIATION DOSE REDUCTION: This exam was performed according to the departmental dose-optimization program which includes automated exposure control, adjustment of the mA and/or kV according to patient size and/or use of iterative reconstruction technique. CONTRAST:  70mL OMNIPAQUE IOHEXOL 350 MG/ML SOLN COMPARISON:  Same day CT head. FINDINGS: CTA NECK FINDINGS Aortic arch: Great vessel origins are patent without significant stenosis. Right carotid system: Atherosclerosis at the carotid bifurcation without greater than 50% stenosis. Retropharyngeal course of the ICA. Left carotid system: Atherosclerosis at the carotid bifurcation without greater than 50% stenosis. Retropharyngeal course of the ICA. Vertebral arteries: Bilateral intradural vertebral arteries are patent with multifocal mild atherosclerotic narrowing. Skeleton: Multilevel degenerative disc disease, greatest at C5-C6 where there is a central calcified disc protrusion. Other neck: No acute abnormality. Upper chest: Visualized lung apices are clear. Review of the  MIP images confirms the above findings CTA HEAD FINDINGS Anterior circulation: Bilateral intracranial ICAs are patent with mild to moderate narrowing from atherosclerosis. Right M1 MCAs patent. Occlusion versus severe stenosis of a proximal right M2 MCA branch with distal reconstitution (for example see  series 9, images 87 through 90 and series 6, images 236 and 237). Left M1 MCAs patent with early bifurcation. Severe stenosis of a proximal left M 2 MCA branch. Bilateral ACAs are patent. Posterior circulation: Moderate stenosis of left vertebral artery at its dural margin. Vertebral arteries are patent. Small fenestration of the proximal basilar artery, anatomic variant. Basilar artery is patent. Small vertebrobasilar system with bilateral fetal type PCAs, anatomic variant. Multifocal mild-to-moderate stenosis of bilateral P2 PCAs. Venous sinuses: As permitted by contrast timing, patent. Anatomic variants: Detailed above. Review of the MIP images confirms the above findings IMPRESSION: 1. Occlusion versus severe stenosis of a proximal right M2 MCA branch with distal reconstitution. 2. Severe proximal left M2 MCA stenosis. 3. Moderate stenosis of the left vertebral artery at its dural margin. 4. Mild-to-moderate bilateral intracranial ICA and bilateral P2 PCA stenoses. Findings discussed with Dr. Langston Masker via telephone at 6:42 p.m. Electronically Signed   By: Margaretha Sheffield M.D.   On: 01/13/2022 18:58  ? ?CT HEAD WO CONTRAST (5MM) ? ?Result Date: 01/13/2022 ?CLINICAL DATA:  Stroke follow-up. Right arm paralysis. The right-sided weakness began January 08, 2022. Altered gait. EXAM: CT HEAD WITHOUT CONTRAST TECHNIQUE: Contiguous axial images were obtained from the base of the skull through the vertex without intravenous contrast. RADIATION DOSE REDUCTION: This exam was performed according to the departmental dose-optimization program which includes automated exposure control, adjustment of the mA and/or kV according to patient size and/or use of iterative reconstruction technique. COMPARISON:  September 23, 2019 CT scan of the brain FINDINGS: Brain: No subdural, epidural, or subarachnoid hemorrhage identified. The patient's known right cerebellar infarct is chronic in appearance. The remainder of the cerebellum is normal  in appearance. The brainstem and basal cisterns are normal. Ventricles and sulci are stable. No mass effect or midline shift. Periventricular white matter changes are again identified, stable on the right but increased on the left in the interval. The increased white matter changes on the left involve the coronal radiata and abut the body of the left caudate. The overlying cortex is intact. There is a lacunar infarct in the left thalamus not identified previously as seen on series 4, image 16. No other evidence of acute ischemia or infarct. Ventricles and sulci are stable. Vascular: Calcified atherosclerosis is seen in the intracranial carotids. Skull: Normal. Negative for fracture or focal lesion. Sinuses/Orbits: No acute finding. Other: None. IMPRESSION: 1. New white matter changes on the left primarily involving the corona radiata, abutting the body of the left caudate. There is also a lacunar infarct in the left thalamus not identified on the previous study. The overlying cortex is intact. These findings likely are the cause of the patient's right-sided symptoms. 2. No other acute abnormalities. Electronically Signed   By: Dorise Bullion III M.D.   On: 01/13/2022 16:15  ? ?MR ANGIO HEAD WO CONTRAST ? ?Result Date: 01/14/2022 ?CLINICAL DATA:  66 year old female with evidence of progressed small vessel disease since 2020 on head CT yesterday. CTA head and neck positive for occluded or severely stenotic proximal bilateral MCA M2 branches. Confluent left corona radiata infarct on MRI today. EXAM: MRA HEAD WITHOUT CONTRAST TECHNIQUE: Angiographic images of the Circle of Willis were acquired using MRA technique without  intravenous contrast. COMPARISON:  Brain MRI earlier today.  CTA head and neck yesterday. FINDINGS: Anterior circulation: Antegrade flow in both ICA siphons with bilateral siphon irregularity in keeping with atherosclerosis demonstrated by CTA yesterday. Moderate right petrous segment stenosis appears  to be genuine when compared to the CTA (series 1049, image 9). No hemodynamically significant left siphon stenosis. Patent carotid termini, MCA and ACA origins. Moderate stenosis of the right ACA origin is stable. D

## 2022-01-14 NOTE — Evaluation (Signed)
Occupational Therapy Evaluation ?Patient Details ?Name: Samantha [name] ?MRN: [id number] ?DOB: [birth year] ?Today's Date: 01/14/2022 ? ? ?History of Present Illness 66 y.o. female  who presents 01/13/22 with a 5-day history of right upper extremity weakness. CT head infarct Lt thalamusand changes in left corona radiata and caudate; 3/19 a.m. increased Rt sided symptoms  PMH significant of hypertension, hyperlipidemia, diabetes mellitus type II; prior rt cerebellar infarct  ? ?Clinical Impression ?  ?Pt admitted for concerns listed above. PTA pt reported that she was independent with all ADL's and IADL's, using no DME. At this time, pt presents with significant R side weakness, balance deficits, mild cognitive deficits, and decreased activity tolerance.  Pt requires mod A +2 to sit EOB, Max A +2 to attempt standing x2, and min-max A +1-2 for all ADL's. Recommending AIR to maximize pt's independence and safety. OT will follow acutely.  ?   ? ?Recommendations for follow up therapy are one component of a multi-disciplinary discharge planning process, led by the attending physician.  Recommendations may be updated based on patient status, additional functional criteria and insurance authorization.  ? ?Follow Up Recommendations ? Acute inpatient rehab (3hours/day)  ?  ?Assistance Recommended at Discharge Frequent or constant Supervision/Assistance  ?Patient can return home with the following Two people to help with walking and/or transfers;Two people to help with bathing/dressing/bathroom;Assistance with cooking/housework;Direct supervision/assist for medications management;Direct supervision/assist for financial management;Help with stairs or ramp for entrance;Assist for transportation ? ?  ?Functional Status Assessment ? Patient has had a recent decline in their functional status and demonstrates the ability to make significant improvements in function in a reasonable and predictable amount of time.  ?Equipment  Recommendations ? Other (comment) (TBD)  ?  ?Recommendations for Other Services Rehab consult ? ? ?  ?Precautions / Restrictions Precautions ?Precautions: Fall ?Restrictions ?Weight Bearing Restrictions: No  ? ?  ? ?Mobility Bed Mobility ?Overal bed mobility: Needs Assistance (Required help to move right leg to EOB. Used bed sheet to help pivot patient.) ?Bed Mobility: Supine to Sit ?  ?  ?Supine to sit: Mod assist, +2 for physical assistance, +2 for safety/equipment ?  ?  ?  ?  ? ?Transfers ?Overall transfer level: Needs assistance ?  ?Transfers: Sit to/from Stand, Bed to chair/wheelchair/BSC ?Sit to Stand: +2 physical assistance, Max assist (Scoot and pivot with knee block and arm support.) ?  ?  ?  ?  ?  ?General transfer comment: Unable to maintain standing once fully up with max A +2. ?  ? ?  ?Balance Overall balance assessment: Needs assistance ?Sitting-balance support: Single extremity supported, Feet supported ?Sitting balance-Leahy Scale: Fair ?  ?  ?Standing balance support: Single extremity supported ?Standing balance-Leahy Scale: Poor ?Standing balance comment: Unable to maintain standing for long ?  ?  ?  ?  ?  ?  ?  ?  ?  ?  ?  ?   ? ?ADL either performed or assessed with clinical judgement  ? ?ADL Overall ADL's : Needs assistance/impaired ?Eating/Feeding: Set up;Sitting ?  ?Grooming: Minimal assistance;Sitting ?  ?Upper Body Bathing: Moderate assistance;Sitting ?  ?Lower Body Bathing: Maximal assistance;+2 for safety/equipment;+2 for physical assistance;Sitting/lateral leans;Sit to/from stand ?  ?Upper Body Dressing : Moderate assistance;Sitting ?  ?Lower Body Dressing: Maximal assistance;+2 for safety/equipment;Sitting/lateral leans;Sit to/from stand ?  ?Toilet Transfer: Maximal assistance;+2 for physical assistance;+2 for safety/equipment;Stand-pivot ?  ?Toileting- Clothing Manipulation and Hygiene: Maximal assistance;+2 for physical assistance;+2 for safety/equipment;Sitting/lateral lean;Sit  to/from stand ?  ?  ?  ?  ?  General ADL Comments: Increased assist due to LUE weakness and BLE weakness.  ? ? ? ?Vision Baseline Vision/History: 1 Wears glasses ?Ability to See in Adequate Light: 0 Adequate ?Patient Visual Report: No change from baseline ?Vision Assessment?: No apparent visual deficits  ?   ?Perception   ?  ?Praxis   ?  ? ?Pertinent Vitals/Pain Pain Assessment ?Pain Assessment: No/denies pain  ? ? ? ?Hand Dominance Right ?  ?Extremity/Trunk Assessment Upper Extremity Assessment ?Upper Extremity Assessment: Generalized weakness;RUE deficits/detail ?RUE Deficits / Details: Flaccid with decreased sensation ?RUE Sensation: decreased light touch ?RUE Coordination: decreased fine motor;decreased gross motor ?  ?Lower Extremity Assessment ?Lower Extremity Assessment: Defer to PT evaluation ?  ?Cervical / Trunk Assessment ?Cervical / Trunk Assessment: Normal ?  ?Communication Communication ?Communication: No difficulties ?  ?Cognition Arousal/Alertness: Awake/alert ?Behavior During Therapy: [facility] LLCWFL for tasks assessed/performed ?Overall Cognitive Status: Impaired/Different from baseline ?Area of Impairment: Safety/judgement, Problem solving ?  ?  ?  ?  ?  ?  ?  ?  ?  ?  ?  ?  ?Safety/Judgement: Decreased awareness of safety, Decreased awareness of deficits ?  ?Problem Solving: Slow processing, Requires verbal cues, Requires tactile cues ?General Comments: At times needing increased time to respond/follow commands ?  ?  ?General Comments  VSS on RA ? ?  ?Exercises   ?  ?Shoulder Instructions    ? ? ?Home Living Family/patient expects to be discharged to:: Private residence ?Living Arrangements: Spouse/significant other;Children ?Available Help at Discharge: Family;Available 24 hours/day ?Type of Home: House ?Home Access: Stairs to enter ?Entrance Stairs-Number of Steps: 3 steps ?Entrance Stairs-Rails: Can reach both ?Home Layout: One level ?  ?  ?Bathroom Shower/Tub: Walk-in shower ?  ?Bathroom Toilet: Standard ?   ?  ?Home Equipment: Rolling Walker (2 wheels);BSC/3in1 ?  ?Additional Comments: 3 steps to enter home; single level once inside ? Lives With: Spouse (adult children) ? ?  ?Prior Functioning/Environment Prior Level of Function : Independent/Modified Independent ?  ?  ?  ?  ?  ?  ?Mobility Comments: no AD ?ADLs Comments: Indep ?  ? ?  ?  ?OT Problem List: Decreased strength;Decreased range of motion;Decreased activity tolerance;Impaired balance (sitting and/or standing);Decreased coordination;Decreased safety awareness;Impaired UE functional use ?  ?   ?OT Treatment/Interventions: Self-care/ADL training;Therapeutic exercise;Neuromuscular education;Energy conservation;Therapeutic activities;Patient/family education;Balance training  ?  ?OT Goals(Current goals can be found in the care plan section) Acute Rehab OT Goals ?Patient Stated Goal: To walk again ?OT Goal Formulation: With patient ?Time For Goal Achievement: 01/28/22 ?Potential to Achieve Goals: Good ?ADL Goals ?Pt Will Perform Grooming: with set-up;sitting ?Pt Will Perform Upper Body Bathing: with set-up;sitting ?Pt Will Perform Lower Body Bathing: with min assist;sitting/lateral leans;sit to/from stand ?Pt Will Perform Upper Body Dressing: with set-up;sitting ?Pt Will Perform Lower Body Dressing: with min assist;sitting/lateral leans;sit to/from stand ?Pt Will Transfer to Toilet: with mod assist;ambulating ?Pt Will Perform Toileting - Clothing Manipulation and hygiene: with min assist;sitting/lateral leans;sit to/from stand  ?OT Frequency: Min 2X/week ?  ? ?Co-evaluation PT/OT/SLP Co-Evaluation/Treatment: Yes ?Reason for Co-Treatment: For patient/therapist safety;To address functional/ADL transfers ?PT goals addressed during session: Mobility/safety with mobility ?OT goals addressed during session: ADL's and self-care;Strengthening/ROM ?  ? ?  ?AM-PAC OT "6 Clicks" Daily Activity     ?Outcome Measure Help from another person eating meals?: A Little ?Help from  another person taking care of personal grooming?: A Little ?Help from another person toileting, which includes using toliet, bedpan, or urinal?: A Lot ?Help  from another person bathing (including washing, rinsing, drying

## 2022-01-14 NOTE — Progress Notes (Signed)
?  Echocardiogram ?2D Echocardiogram has been performed. ? ?Samantha [name] ?01/14/2022, 10:12 AM ?

## 2022-01-14 NOTE — Evaluation (Addendum)
Physical Therapy Evaluation ?Patient Details ?Name: Samantha [name] ?MRN: [id number] ?DOB: Sep 11, 1956 ?Today's Date: 01/14/2022 ? ?History of Present Illness ? 66 y.o. female  who presents 01/13/22 with a 5-day history of right upper extremity weakness. CT head infarct Lt thalamusand changes in left corona radiata and caudate; 3/19 a.m. increased Rt sided symptoms  PMH significant of hypertension, hyperlipidemia, diabetes mellitus type II; prior rt cerebellar infarct  ?Clinical Impression ? Pt is a 66 y/o female reporting to PT with chief complaints of right UE and LE weakness. Pt has had symptoms for 5 days following stroke. Symptoms have worsened since being in the hospital.  ? ?Pt was lying in bed upon arrival and was open to getting out of bed. Pt lives in a one story house with 3 steps to enter. Pt lives with her husband and adult child. Prior to admittance to the hospital, pt was independent with ADLs and driving. Pt was awake and was able to answer questions about living situation and orientation. Pt was at times delayed in response time but was able to answer appropriately.  ?  ?Presents to PT with dense R hemiparesis; Pt was unable to move her right arm or leg and had light touch sensory impairments in her right arm and right leg (medial femoral condyle). Pt demonstrated severe weakness in her RLE. Pt demonstrated an ability to try and initiate movement but had difficulty completing movement. Pt was able to demonstrate slight hip flexion and was able to passively move into full knee extension in sitting on RLE. Pt demonstrated a great amount of effort when trying to move and seemed discouraged with her current strength status.  ?  ?PT and OT assisted pt to EOB using bed sheet and support at the trunk. Pt was able to perform sit <> stand with +2 max assistance. Pt responded well to incorporating a 1,2,3 rock pattern to initiate standing. Pt was able to transfer to the chair with +2 max assistance and a scoot pivot  maneuver. Pt was able to  problem solve to pivot towards the chair. Pt required assistance to position her RLE to support her body weight and a knee block to prevent buckling. Pt was agreeable to the idea of inpatient rehab and stated the goal of wanting to get stronger. Pt noted that she enjoys spending time with her grandchildren and seems motivated to get stronger.  ?  ?Pt will benefit from continued skills PT in an inpatient setting to maximize independence and safety with mobility and ADLs.   ?   ? ?Recommendations for follow up therapy are one component of a multi-disciplinary discharge planning process, led by the attending physician.  Recommendations may be updated based on patient status, additional functional criteria and insurance authorization. ? ?Follow Up Recommendations Acute inpatient rehab (3hours/day) ? ?  ?Assistance Recommended at Discharge Intermittent Supervision/Assistance  ?Patient can return home with the following ? A lot of help with walking and/or transfers;Two people to help with walking and/or transfers;A lot of help with bathing/dressing/bathroom;Assistance with cooking/housework;Help with stairs or ramp for entrance ? ?  ?Equipment Recommendations Rolling walker (2 wheels)  ?Recommendations for Other Services ?    ?  ?Functional Status Assessment Patient has had a recent decline in their functional status and demonstrates the ability to make significant improvements in function in a reasonable and predictable amount of time.  ? ?  ?Precautions / Restrictions Precautions ?Precautions: Fall ?Restrictions ?Weight Bearing Restrictions: No  ? ?  ? ?Mobility ? Bed  Mobility ?Overal bed mobility: Needs Assistance (Required help to move right leg to EOB. Used bed sheet to help pivot patient.) ?Bed Mobility: Supine to Sit ?  ?  ?Supine to sit: Mod assist ?  ? Heavy Mod assist of 2 and use of bed pad ?  ?  ? ?Transfers ?Overall transfer level: Needs assistance ?  ?Transfers: Sit to/from Stand,  Bed to chair/wheelchair/BSC ?Sit to Stand: +2 physical assistance, Max assist (Scoot and pivot with knee block and arm support.) ?Stand pivot transfers: +2 physical assistance, Mod assist ?  ? Pt was able to perform sit <> stand with +2 max assistance. Pt responded well to incorporating a 1,2,3 rock pattern to initiate standing. Pt was able to transfer to the chair with +2 max assistance and a scoot pivot maneuver. Pt was able to  problem solve to pivot towards the chair. Pt required assistance to position her RLE to support her body weight and a knee block to prevent buckling. ?  ?  ?  ?  ? ?Ambulation/Gait ?  ?  ?  ?  ?  ?  ?  ?  ? ?Stairs ?  ?  ?  ?  ?  ? ?Wheelchair Mobility ?  ? ?Modified Rankin (Stroke Patients Only) ?  ? ?  ? ?Balance   ?  ?  ?  ?  ?  ?  ?  ?  ?  ?  ?  ?  ?  ?  ?  ?  ?  ?  ?   ? ? ? ?Pertinent Vitals/Pain Pain Assessment ?Pain Assessment: No/denies pain  ? ? ?Home Living Family/patient expects to be discharged to:: Private residence ?Living Arrangements: Spouse/significant other ?Available Help at Discharge: Family;Available 24 hours/day ?Type of Home: House ?  ?  ?  ?  ?  ?  ?Additional Comments: 3 steps to enter home; single level once inside  ?  ?Prior Function Prior Level of Function : Independent/Modified Independent (Pt was independent with driving and ADL and did not require an assistive device to walk.) ?  ?  ?  ?  ?  ?  ?Mobility Comments: +2 maximum assist with scoot and pivot ?  ?  ? ? ?Hand Dominance  ? Dominant Hand: Right ? ?  ?Extremity/Trunk Assessment  ? Upper Extremity Assessment ?Upper Extremity Assessment: RUE deficits/detail ?LUE Sensation: decreased light touch ?LUE Coordination: decreased fine motor;decreased gross motor ?  ? ?Lower Extremity Assessment ?Lower Extremity Assessment: RLE deficits/detail ?LLE Sensation: decreased light touch;decreased proprioception ?LLE Coordination: decreased fine motor;decreased gross motor ?  ? ?   ?Communication  ?    ?Cognition  Arousal/Alertness: Awake/alert ?Behavior During Therapy: [facility] for tasks assessed/performed ?Overall Cognitive Status: Impaired/Different from baseline ?Area of Impairment:  (at times delayed in answering questions) ?  ?  ?  ?  ?  ?  ?  ?  ?  ?  ?  ?  ?  ?  ?  ?  ?  ?  ? ?  ?General Comments   ? ?  ?Exercises    ? ?Assessment/Plan  ?  ?PT Assessment Patient needs continued PT services  ?PT Problem List Decreased strength;Decreased activity tolerance;Decreased mobility;Decreased coordination;Impaired sensation ? ?   ?  ?PT Treatment Interventions Gait training;Functional mobility training;Therapeutic activities;Neuromuscular re-education   ? ?PT Goals (Current goals can be found in the Care Plan section)  ?Acute Rehab PT Goals ?Patient Stated Goal: Get stronger and back to baseline. Pt mentioned how  she enjoyed spending time with family. ?PT Goal Formulation: With patient ?Time For Goal Achievement: 01/28/22 ?Potential to Achieve Goals: Good ? ?  ?Frequency Min 4X/week ?  ? ? ?Co-evaluation PT/OT/SLP Co-Evaluation/Treatment: Yes ([place]) ?Reason for Co-Treatment: For patient/therapist safety;To address functional/ADL transfers ?PT goals addressed during session: Mobility/safety with mobility ?OT goals addressed during session: ADL's and self-care ?  ? ? ?  ?AM-PAC PT "6 Clicks" Mobility  ?Outcome Measure Help needed turning from your back to your side while in a flat bed without using bedrails?: A Little ?Help needed moving from lying on your back to sitting on the side of a flat bed without using bedrails?: A Lot ?Help needed moving to and from a bed to a chair (including a wheelchair)?: A Lot ?Help needed standing up from a chair using your arms (e.g., wheelchair or bedside chair)?: A Lot ?Help needed to walk in hospital room?: Total ?Help needed climbing 3-5 steps with a railing? : Total ?6 Click Score: 11 ? ?  ?End of Session Equipment Utilized During Treatment: Gait belt ?Activity Tolerance: Patient tolerated  treatment well ?Patient left: in chair;with call bell/phone within reach ?Nurse Communication: Mobility status ?PT Visit Diagnosis: Other abnormalities of gait and mobility (R26.89);Muscle weakness (generali

## 2022-01-14 NOTE — Evaluation (Signed)
Speech Language Pathology Evaluation ?Patient Details ?Name: Samantha [name] ?MRN: [id number] ?DOB: [birth year] ?Today's Date: 01/14/2022 ?Time: 1030-1050 ?SLP Time Calculation (min) (ACUTE ONLY): 20 min ? ?Problem List:  ?Patient Active Problem List  ? Diagnosis Date Noted  ? CVA (cerebral vascular accident) (HCC) 01/13/2022  ? AKI (acute kidney injury) (HCC) 01/13/2022  ? Hyponatremia 01/13/2022  ? Elevated troponin 01/13/2022  ? Cerebrovascular accident (CVA) (HCC) 12/15/2020  ? Noncompliance with diabetes treatment 12/15/2020  ? Hyperlipidemia associated with type 2 diabetes mellitus (HCC) 08/11/2020  ? Type 2 diabetes mellitus without complication, without long-term current use of insulin (HCC) 10/10/2018  ? Hypertension 10/10/2018  ? Morbid obesity (HCC) 10/10/2018  ? ?Past Medical History: No past medical history on file. ?Past Surgical History:  ?Past Surgical History:  ?Procedure Laterality Date  ? TUBAL LIGATION    ? 23 yrs ago  ? ?HPI:  ?Pt is a 66 y.o. female who presented with a 5-day history of right upper extremity weakness. 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 MRI brain 3/19: Confluent acute white matter infarct of the left corona radiata. 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  PMH: hypertension, hyperlipidemia, diabetes mellitus type II  ? ?Assessment / Plan / Recommendation ?Clinical Impression ? Pt participated in speech-language-cognition evaluation with her husband and daughters present. Pt reported that she is currently unemployed and she denied any baseline deficits in speech, language, or cognition. Pt's daughters reported that the pt's speech is slurred at baseline. Pt initially denied any acute changes in cognition, but stated that at the end of the evaluation that "I'm not normal; that was not my normal." The [facility] Mental Status Examination was completed to evaluate the pt's cognitive-linguistic skills. Her  score was adjusted to exclude portions of the evaluation which required writing. She achieved an adjusted score of 10/24 which is below the normal limits. She exhibited difficulty in the areas of awareness, attention, memory, complex problem solving, and executive function. She also presented with mild dysarthria characterized by reduced articulatory precision and reduced vocal intensity which negatively impacted speech intelligibility at the conversational level. Skilled SLP services are clinically indicated at this time to improve motor speech and cognitive-linguistic function. ?   ?SLP Assessment ? 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  ?Recommendations for follow up therapy are one component of a multi-disciplinary discharge planning process, led by the attending physician.  Recommendations may be updated based on patient status, additional functional criteria and insurance authorization. ?   ?Follow Up Recommendations ?  (Continued SLP services at level of care recommended by PT/OT)  ?  ?Assistance Recommended at Discharge ? Frequent or constant Supervision/Assistance  ?Functional Status Assessment Patient has had a recent decline in their functional status and demonstrates the ability to make significant improvements in function in a reasonable and predictable amount of time.  ?Frequency and Duration min 2x/week  ?2 weeks ?  ?   ?SLP Evaluation ?Cognition ? Overall Cognitive Status: Impaired/Different from baseline ?Arousal/Alertness: Awake/alert ?Orientation Level: Oriented to person;Oriented to place;Oriented to situation;Disoriented to time ?Year:  (2003) ?Month: March ?Day of Week: Incorrect (Saturday, March 20) ?Attention: Focused;Sustained ?Memory: Impaired ?Memory Impairment: Retrieval deficit;Decreased short term memory (Imediate: 5/5 with 4  repetitions; delayed: 1/5; with cues: 3/4) ?Awareness: Impaired ?Awareness Impairment: Emergent impairment ?Problem Solving: Impaired ?Problem Solving Impairment: Verbal complex (Money: 1/3) ?Executive Function: Sequencing;Organizing ?Organizing:  Impaired ?Organizing Impairment: Verbal complex (Backward digit span: 1/2)  ?  ?   ?Comprehension ? Auditory Comprehension ?Overall Auditory Comprehension: Impaired ?Conversation: Complex  ?  ?Expression Expression ?Primary Mode of Expression: Verbal ?Verbal Expression ?Overall Verbal Expression: Appears within functional limits for tasks assessed ?Initiation: No impairment ?Level of Generative/Spontaneous Verbalization: Conversation ?Naming: Impairment ?Divergent:  (5 within 1 minute)   ?Oral / Motor ? Oral Motor/Sensory Function ?Overall Oral Motor/Sensory Function: Mild impairment ?Facial ROM: Reduced right ?Facial Symmetry: Abnormal symmetry right ?Facial Strength: Reduced right ?Facial Sensation: Within Functional Limits ?Lingual ROM: Reduced right;Suspected CN XII (hypoglossal) dysfunction ?Lingual Symmetry: Abnormal symmetry right;Suspected CN XII (hypoglossal) dysfunction ?Lingual Strength: Reduced;Suspected CN XII (hypoglossal) dysfunction ?Lingual Sensation: Within Functional Limits ?Velum: Within Functional Limits ?Mandible: Within Functional Limits ?Motor Speech ?Overall Motor Speech: Impaired ?Respiration: Within functional limits ?Phonation: Low vocal intensity ?Resonance: Within functional limits ?Articulation: Impaired ?Level of Impairment: Sentence ?Intelligibility: Intelligibility reduced ?Word: 75-100% accurate ?Phrase: 75-100% accurate ?Sentence: 75-100% accurate ?Conversation: 75-100% accurate ?Motor Planning: Witnin functional limits ?Motor Speech Errors: Aware   ?        ?Samantha [name] I. Vear Clock, MS, CCC-SLP ?Acute Rehabilitation Services ?Office number [phone] ?Pager [phone] ? ?Samantha [name] ?01/14/2022, 11:23 AM ? ? ? ? ?

## 2022-01-14 NOTE — Progress Notes (Signed)
Overnight progress note ? ?Patient had an acute change in her neurologic exam this morning and was transported to MRI before I was notified.  She is currently in MRI and I am not able to assess her.  Neurology has been notified and will assess the patient after she is back from MRI. ?

## 2022-01-14 NOTE — Progress Notes (Signed)
Speech Language Pathology Treatment: Cognitive-Linquistic (dysarthria)  ?Patient Details ?Name: Samantha [name] ?MRN: [id number] ?DOB: December 31, 1955 ?Today's Date: 01/14/2022 ?Time: 0254-2706 ?SLP Time Calculation (min) (ACUTE ONLY): 17 min ? ?Assessment / Plan / Recommendation ?Clinical Impression ? Pt was seen for dysarthria treatment with her husband and daughters present. Pt and her family were educated regarding the nature of dysarthria, and compensatory strategies to improve speech intelligibility. All parties verbalized understanding and all their questions were answered. Pt verbalized understanding regarding all areas of education. She used compensatory strategies at the phrase level with 80% accuracy increasing to 100% accuracy with cues for overarticulation. At the 5-7 word sentence level she demonstrated 60% accuracy increasing to 100% accuracy with verbal prompts for overarticulation and vocal intensity. Some generalization was noted to conversation at the end of the session. SLP will continue to follow pt.  ?  ?HPI HPI: Pt is a 66 y.o. female who presented with a 5-day history of right upper extremity weakness. 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 MRI brain 3/19: Confluent acute white matter infarct of the left corona radiata. 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  PMH: hypertension, hyperlipidemia, diabetes mellitus type II ?  ?   ?SLP Plan ? Continue with current plan of care ? ?Patient needs continued Speech Lanaguage Pathology Services ?  ?Recommendations for follow up therapy are one component of a multi-disciplinary discharge planning process, led by the attending physician.  Recommendations may be updated based on patient status, additional functional criteria and insurance authorization. ?  ? ?Recommendations  ?   ?   ?    ?   ? ? ? ? Follow Up Recommendations:  (Continued SLP services at level of care recommended by  PT/OT) ?Assistance recommended at discharge: Frequent or constant Supervision/Assistance ?SLP Visit Diagnosis: Cognitive communication deficit (R41.841);Dysarthria and anarthria (R47.1) ?Plan: Continue with current plan of care ? ? ? ? ?  ?  ?Ladislav Caselli I. Vear Clock, MS, CCC-SLP ?Acute Rehabilitation Services ?Office number [phone] ?Pager [phone] ? ? ?Scheryl Marten ? ?01/14/2022, 11:27 AM ? ? ? ? ?

## 2022-01-14 NOTE — Hospital Course (Addendum)
Samantha [name] is Samantha [name] 66 year old female with past medical history significant for type 2 diabetes mellitus, essential hypertension, hyperlipidemia, morbid obesity who presented to [facility] ED on 3/18 via EMS with Samantha [name] 5-day history of right upper extremity weakness.  She was intermittently confused at presentation and also was noted to be unsteady on her feet.  She had MRI that showed confluent acute white matter infarct of the L corona radiata and superimposed punctate acute on chronic right cerebellar Sca territory ischemia.  Neurology was consulted and recommended DAPT x 3 months, then aspirin alone with her severe intracranial stenosis.  Plan at this point is discharge to CIR. ? ?See below for additional details ?

## 2022-01-14 NOTE — Progress Notes (Signed)
Brief Neuro Update: ? ?Was notified of change in her neuro exam. On my evaluation, she has RLE and RUE weakness and does seem to have some speech apraxia. Full NIHSS as below. ? ?NIHSS components Score: Comment  ?1a Level of Conscious 0[x]  1[]  2[]  3[]      ?1b LOC Questions 0[x]  1[]  2[]       ?1c LOC Commands 0[x]  1[]  2[]       ?2 Best Gaze 0[x]  1[]  2[]       ?3 Visual 0[x]  1[]  2[]  3[]      ?4 Facial Palsy 0[x]  1[]  2[]  3[]      ?5a Motor Arm - left 0[x]  1[]  2[]  3[]  4[]  UN[]    ?5b Motor Arm - Right 0[]  1[]  2[]  3[]  4[x]  UN[]    ?6a Motor Leg - Left 0[x]  1[]  2[]  3[]  4[]  UN[]    ?6b Motor Leg - Right 0[]  1[]  2[]  3[x]  4[]  UN[]    ?7 Limb Ataxia 0[x]  1[]  2[]  3[]  UN[]     ?8 Sensory 0[x]  1[]  2[]  UN[]      ?9 Best Language 0[]  1[x]  2[]  3[]      ?10 Dysarthria 0[x]  1[]  2[]  UN[]      ?11 Extinct. and Inattention 0[x]  1[]  2[]       ?TOTAL: 8   ? ?This is a significant change from her prior NIHSS by Dr. Leonel Ramsay which was at 4. ? ?On chart review, it appears that she has multifocal multivessel stenosis. Most notably occlusion versus severe stenosis of a proximal right M2 MCA branch with distal reconstitution. Severe proximal left M2 MCA stenosis. ? ?She was also started on her home Amlodipine and did get a dose last night. I discontinued Amlodipine and PRN Hydralazine. ? ?Recs: ?- Fluid bolus 1L IV once and will start her on 149ml/hr maintenance fluids ?- Strongly recommend permissive hypertension for atleast 2 days given multifocal multivessel stenosis. Longterm BP goal 130-150. ?

## 2022-01-14 NOTE — Progress Notes (Signed)
Pt now unable to voluntarily move her right leg. Her leg does withdraw slightly from pain but appears to be involuntary movement. Pt states that her sensation is fully intact, however she was not able to tell me when I was touching her leg without looking. Pt also seems to be experiencing right-sided neglect that was not present in previous assessments. Her mentation and speech both seem to have declined since the last assessment as she now has very delayed responses and difficulty answering questions. She is only able to correctly tell me her name after some delay and is unable to tell me her birthday without help from her husband. When asked other questions she does not answer and I am unsure if she is understanding what is being asked or if she is only having issues answering the questions. Transport at bedside during assessment to take pt to her MRI. Dr. Marlowe Sax updated on changes in pt neuro status.  ?

## 2022-01-14 NOTE — Progress Notes (Signed)
Dr. Loney Loh notified pt CBG is over the ordered SSI limit of 400. Pt given semglee as ordered. Awaiting response.  ?

## 2022-01-14 NOTE — Progress Notes (Addendum)
CBG recheck for [id number], MD notified, in person verbal orders to give 15units novolog received ?

## 2022-01-14 NOTE — Progress Notes (Signed)
?PROGRESS NOTE ? ? ? Samantha [name]  XIP:[id number] DOB: [birth year] DOA: 01/13/2022 ?PCP: Trey Sailors, PA-C (Inactive)  ? ? ?Brief Narrative:  ?Samantha [name] is a 66 year old female with past medical history significant for type 2 diabetes mellitus, essential hypertension, hyperlipidemia, morbid obesity who presented to [facility] ED on 3/18 via EMS with a 5-day history of right upper extremity weakness.  Her daughter is present at bedside and helps provide additional history as the patient appears to be confused at times.  At baseline patient had been able to ambulate without assistance and was normally alert and oriented.  Since symptoms started patient has had progressively worsening weakness of the right arm until she has not been able to use it at all over the last couple of days.  Her daughter notes that the patient's speech seems to be clear when she initially starts talking, but slurs near the end of her sentences.  Also notes that the patient has been mixing up things like right and left.  Since symptoms started patient has been laying in bed, and seems to be unsteady on her feet when ambulating which is new.  Denies having any palpitations, headache, chest pain, nausea, vomiting, diarrhea symptoms. ?  ?In the ED, temperature of 99 ?F, HR 93-105, RR 16-28, BP elevated up to 182/98, and SPO2 97% on room air.  CT head noted new white matter changes on the left primarily involving the corona radiata abutting the body of the left caudate, and a lacunar infarct of the left thalamus.  Sodium 134, CO2 29, BUN 25, creatinine 1.26, glucose 494, anion gap 10, high-sensitivity troponin 25, and LDL 115.  Neurology have been consulted and recommended completion of stroke work-up.  Patient was not a candidate for tPA as she was out of the window.  CTA of the head and neck was ordered, but delayed as patient's IV infiltrated.  TRH consulted for further evaluation and management.  ? ? ?  ?Assessment and Plan: ?* CVA (cerebral  vascular accident) (HCC) ?Patient presenting with a 5-day history of paralysis of her right arm/leg, slurred speech, gait disturbance, and confusion.  CT head concerning for new white matter changes on the left primarily involving the corona radiata abutting the body of the left caudate, and a lacunar infarct of the left thalamus.  She was out of the window for tPA.  Risk factors include hypertension, hyperlipidemia, poorly controlled DM type II, and obesity.  More brain without contrast with confluent acute white matter infarct left corona radiata without hemorrhage/mass effect; superimposed punctate acute on chronic right cerebellar SCA territory ischemia without hemorrhage/mass effect, chronic left thalamic lacune with moderately advanced bilateral white matter disease.  CT angiogram head/neck with occlusion versus severe stenosis proximal right M2 MCA branch with distal reconstitution, severe proximal left M2 MCA stenosis, moderate stenosis left vertebral artery, mild/moderate bilateral intracranial ICA and bilateral P2 PCA stenosis.  Hemoglobin A1c 13.1, LDL 115. ?--Neurology following, appreciate assistance ?--MRA head without contrast: Pending ?--TTE: Pending ?--PT/OT/SLP: Pending ?--Aspirin 81 mg p.o. daily ?--Plavix 75 mg p.o. daily ?--Atorvastatin 80 mg p.o. daily ?--Allow for permissive hypertension (ok for <220/120) ?--Continue to monitor on telemetry ? ? ? ?Type 2 diabetes mellitus without complication, without long-term current use of insulin (HCC) ?Patient is initial glucose elevated at 494 without elevated anion gap.  Home medication regimen includes Amaryl 4 mg PO daily.  Hemoglobin A1c 13.1, poorly controlled. ?--Hold Amaryl ?--Increase Semglee to 25 units daily ?--moderate SSI for coverage ?--CBGs qAC/HS ?--  Diabetic education and dietitian consult: pending ? ? ?AKI (acute kidney injury) (HCC) ?Creatinine elevated at 1.26 with BUN 25 on admission.  Baseline creatinine previously had been within  normal limits back in 2021.  Given hyperglycemia suspect prerenal cause of symptoms versus progression now to CKD given poorly controlled HTN and DM2. ?--Cr 1.24>1.42 ?--IVF w/ NS at 65mL/hr ?--Check renal ultrasound ?--BMP daily ? ?Hypertension ?On admission blood pressures elevated up to 182/98.  Home blood pressure regimen includes amlodipine 10 mg daily, hydrochlorothiazide 25 mg daily, and lisinopril 40 mg daily. ?--Holding home amlodipine 10mg  PO daily to allow permissive hypertension in the setting of acute CVA above ?--holding HCTZ and lisinopril due to AKI ?-- Allowing permissive hypertension in the setting of acute CVA; okay for BP >220/120 next 24-48h. ? ? ?Hyperlipidemia associated with type 2 diabetes mellitus (HCC) ?On admission LDL 115.  Home medication regimen included atorvastatin 10 mg daily. ?--Goal LDL less than 70 ?--Increased atorvastatin to 80 mg daily ? ?Hyponatremia ?Sodium just mildly low at 134.  This appears to be a pseudohyponatremia secondary to patient's hyperglycemia. ?--Continue to monitor BMP daily, aggressive management of DM2 ? ?Elevated troponin ?High-sensitivity troponin elevated at 25>37.  Remains chest pain-free.  EKG with NSR, rate 90, QTc 474, T wave inversion 1, aVL, no other concerning dynamic changes.  No previous EKG available for review in EMR. ?--Continue to monitor on telemetry ? ?Morbid obesity (HCC) ?Body mass index is 56.25 kg/m?  Discussed with patient needs for aggressive lifestyle changes/weight loss as this complicates all facets of care.  Outpatient follow-up with PCP.  May benefit from bariatric evaluation outpatient. ? ? ? ? ?DVT prophylaxis: enoxaparin (LOVENOX) injection 40 mg Start: 01/13/22 1800 ? ?  Code Status: Full Code ?Family Communication: Husband present at bedside ? ?Disposition Plan:  ?Level of care: Telemetry Medical ?Status is: Inpatient ?Remains inpatient appropriate because: Continues further stroke work-up, pending PT/OT/SLP evaluation and  further neurology recommendations.  Anticipate need for inpatient rehab versus SNF placement. ?  ? ?Consultants:  ?Neurology ? ?Procedures:  ?TTE: Pending ? ?Antimicrobials:  ?None ? ? ?Subjective: ?Patient seen examined bedside, resting comfortably.  No specific complaints this morning.  Overnight, nursing staff noted further deterioration in her neurological status.  Was seen by neurology and antihypertensives were discontinued to allow for permissive hypertension over the next 24-48 hours.  Spouse present at bedside, states he feels his wife is slightly improving.  Patient continues with right upper/lower extremity weakness with altered sensation in regards to right upper/lower extremity versus left side; and speech improved.  No other specific complaints or concerns at this time.  Denies headache, no dizziness, no chest pain, no palpitations, no shortness of breath, no abdominal pain, no fever/chills/night sweats, no nausea/vomiting/diarrhea, no fatigue.  No other acute concerns overnight per nursing staff. ? ?Objective: ?Vitals:  ? 01/13/22 2043 01/13/22 2359 01/14/22 0406 01/14/22 0752  ?BP: (!) 169/92 121/63 119/71 (!) 161/72  ?Pulse: 96 87 84 91  ?Resp: 20 20 14 16   ?Temp: 99 ?F (37.2 ?C) (!) 97.5 ?F (36.4 ?C) 98 ?F (36.7 ?C) 98.4 ?F (36.9 ?C)  ?TempSrc: Oral Axillary Axillary Oral  ?SpO2: 97% 98% 95% 96%  ?Height:  5' (1.524 m)    ? ? ?Intake/Output Summary (Last 24 hours) at 01/14/2022 1024 ?Last data filed at 01/14/2022 0407 ?Gross per 24 hour  ?Intake 356.03 ml  ?Output 350 ml  ?Net 6.03 ml  ? ?There were no vitals filed for this visit. ? ?Examination: ? ?  Physical Exam: ?GEN: NAD, alert and oriented x 3, morbidly obese ?HEENT: NCAT, PERRL, EOMI, sclera clear, MMM ?PULM: CTAB w/o wheezes/crackles, normal respiratory effort, on room air ?CV: RRR w/o M/G/R ?GI: abd soft, NTND, NABS, no R/G/M ?MSK: no peripheral edema, right lower extremity muscle strength 2/5, right upper extremity muscle strength 1/5 ?NEURO:  CN II-XII intact, decreased sensation in comparison right upper/lower extremity to left upper/lower extremity ?PSYCH: normal mood/affect ?Integumentary: dry/intact, no rashes or wounds ? ? ? ?Data Rev

## 2022-01-14 NOTE — TOC Initial Note (Signed)
Transition of Care (TOC) - Initial/Assessment Note  ? ? ?Patient Details  ?Name: Samantha [name] ?MRN: [id number] ?Date of Birth: 14-Apr-1956 ? ?Transition of Care (TOC) CM/SW Contact:    ?Kermit Balo, RN ?Phone Number: ?01/14/2022, 12:00 PM ? ?Clinical Narrative:                 ?Patient is from home with spouse and adult son. She has 24 hour supervision.  ?PCP: Dr Shella Spearing in [place].  ?Pt has insurance and CM has copied the card and will send it to Admission to be entered. Spouse states she also has medicare A.  ?Preferred pharmacy: [facility] at [address] ?Awaiting PT/OT evals for disposition. ?Daughter at the bedside is willing to assist with new DM needs at home. She states pt will need glucose meter and all the supplies at discharge.  ?TOC following. ? ?Expected Discharge Plan: Home/Self Care ?Barriers to Discharge: Continued Medical Work up ? ? ?Patient Goals and CMS Choice ?  ?  ?  ? ?Expected Discharge Plan and Services ?Expected Discharge Plan: Home/Self Care ?  ?Discharge Planning Services: CM Consult ?  ?Living arrangements for the past 2 months: Single Family Home ?                ?  ?  ?  ?  ?  ?  ?  ?  ?  ?  ? ?Prior Living Arrangements/Services ?Living arrangements for the past 2 months: Single Family Home ?Lives with:: Spouse, Adult Children (son) ?Patient language and need for interpreter reviewed:: Yes ?Do you feel safe going back to the place where you live?: Yes      ?Need for Family Participation in Patient Care: Yes (Comment) ?Care giver support system in place?: Yes (comment) ?Current home services: DME (3 in 1) ?Criminal Activity/Legal Involvement Pertinent to Current Situation/Hospitalization: No - Comment as needed ? ?Activities of Daily Living ?Home Assistive Devices/Equipment: None ?ADL Screening (condition at time of admission) ?Patient's cognitive ability adequate to safely complete daily activities?: Yes ?Is the patient deaf or have difficulty hearing?: No ?Does the patient have  difficulty seeing, even when wearing glasses/contacts?: No ?Does the patient have difficulty concentrating, remembering, or making decisions?: No ?Patient able to express need for assistance with ADLs?: Yes ?Does the patient have difficulty dressing or bathing?: Yes ?Independently performs ADLs?: No ?Communication: Independent ?Dressing (OT): Needs assistance ?Is this a change from baseline?: Change from baseline, expected to last >3 days ?Grooming: Needs assistance ?Is this a change from baseline?: Change from baseline, expected to last >3 days ?Feeding: Independent ?Bathing: Needs assistance ?Is this a change from baseline?: Change from baseline, expected to last >3 days ?Toileting: Needs assistance ?Is this a change from baseline?: Change from baseline, expected to last >3days ?In/Out Bed: Needs assistance ?Is this a change from baseline?: Change from baseline, expected to last >3 days ?Walks in Home: Needs assistance ?Is this a change from baseline?: Change from baseline, expected to last >3 days ?Does the patient have difficulty walking or climbing stairs?: Yes ?Weakness of Legs: Right ?Weakness of Arms/Hands: Right ? ?Permission Sought/Granted ?  ?  ?   ?   ?   ?   ? ?Emotional Assessment ?Appearance:: Appears stated age ?Attitude/Demeanor/Rapport: Engaged ?Affect (typically observed): Accepting ?Orientation: : Oriented to Self, Oriented to Place, Oriented to Situation ?  ?Psych Involvement: No (comment) ? ?Admission diagnosis:  Hyperglycemia [R73.9] ?CVA (cerebral vascular accident) [facility]) [I63.9] ?Cerebrovascular accident (CVA), unspecified mechanism (HCC) [I63.9] ?Patient  Active Problem List  ? Diagnosis Date Noted  ? CVA (cerebral vascular accident) (HCC) 01/13/2022  ? AKI (acute kidney injury) (HCC) 01/13/2022  ? Hyponatremia 01/13/2022  ? Elevated troponin 01/13/2022  ? Cerebrovascular accident (CVA) (HCC) 12/15/2020  ? Noncompliance with diabetes treatment 12/15/2020  ? Hyperlipidemia associated with  type 2 diabetes mellitus (HCC) 08/11/2020  ? Type 2 diabetes mellitus without complication, without long-term current use of insulin (HCC) 10/10/2018  ? Hypertension 10/10/2018  ? Morbid obesity (HCC) 10/10/2018  ? ?PCP:  Trey Sailors, PA-C (Inactive) ?Pharmacy:   ?[id number] -  ([place]), [place] - [address] ?[address] ?[place] ([place]) [place] [address] ?Phone: [phone] Fax: [phone] ? ? ? ? ?Social Determinants of Health (SDOH) Interventions ?  ? ?Readmission Risk Interventions ?No flowsheet data found. ? ? ?

## 2022-01-14 NOTE — Assessment & Plan Note (Signed)
Body mass index is 56.25 kg/m?Marland Kitchen  Discussed with patient needs for aggressive lifestyle changes/weight loss as this complicates all facets of care.  Outpatient follow-up with PCP.  May benefit from bariatric evaluation outpatient. ? ?

## 2022-01-15 ENCOUNTER — Telehealth: Payer: Self-pay

## 2022-01-15 DIAGNOSIS — I679 Cerebrovascular disease, unspecified: Secondary | ICD-10-CM

## 2022-01-15 LAB — BASIC METABOLIC PANEL
Anion gap: 6 (ref 5–15)
BUN: 25 mg/dL — ABNORMAL HIGH (ref 8–23)
CO2: 26 mmol/L (ref 22–32)
Calcium: 8.4 mg/dL — ABNORMAL LOW (ref 8.9–10.3)
Chloride: 104 mmol/L (ref 98–111)
Creatinine, Ser: 1.31 mg/dL — ABNORMAL HIGH (ref 0.44–1.00)
GFR, Estimated: 45 mL/min — ABNORMAL LOW (ref >60)
Glucose, Bld: 184 mg/dL — ABNORMAL HIGH (ref 70–99)
Potassium: 4.1 mmol/L (ref 3.5–5.1)
Sodium: 136 mmol/L (ref 135–145)

## 2022-01-15 LAB — GLUCOSE, CAPILLARY
Glucose-Capillary: 216 mg/dL — ABNORMAL HIGH (ref 70–99)
Glucose-Capillary: 262 mg/dL — ABNORMAL HIGH (ref 70–99)
Glucose-Capillary: 253 mg/dL — ABNORMAL HIGH (ref 70–99)
Glucose-Capillary: 278 mg/dL — ABNORMAL HIGH (ref 70–99)
Glucose-Capillary: 279 mg/dL — ABNORMAL HIGH (ref 70–99)
Glucose-Capillary: 155 mg/dL — ABNORMAL HIGH (ref 70–99)

## 2022-01-15 MED ORDER — INSULIN ASPART 100 UNIT/ML IJ SOLN
3.0000 [IU] | Freq: Three times a day (TID) | INTRAMUSCULAR | Status: DC
  Administered 2022-01-15 (×3): 3 [IU] via SUBCUTANEOUS

## 2022-01-15 MED ORDER — INSULIN GLARGINE-YFGN 100 UNIT/ML ~~LOC~~ SOLN
35.0000 [IU] | Freq: Every day | SUBCUTANEOUS | Status: DC
  Administered 2022-01-15: 35 [IU] via SUBCUTANEOUS
  Filled 2022-01-15 (×4): qty 0.35

## 2022-01-15 MED ORDER — ASPIRIN EC 325 MG PO TBEC
325.0000 mg | DELAYED_RELEASE_TABLET | Freq: Every day | ORAL | Status: DC
  Administered 2022-01-16 – 2022-01-17 (×2): 325 mg via ORAL
  Filled 2022-01-15 (×2): qty 1

## 2022-01-15 NOTE — Progress Notes (Signed)
STROKE TEAM PROGRESS NOTE  ? ?SUBJECTIVE (INTERVAL HISTORY) ?Pt sitting in chair, no family at bedside.  Patient still have right-sided hemiplegia with right arm 0/5, right lower extremity barely 2/5.  PT/OT recommend CIR. ? ?OBJECTIVE ?Temp:  [97.9 ?F (36.6 ?C)-99.1 ?F (37.3 ?C)] 99 ?F (37.2 ?C) (03/20 1602) ?Pulse Rate:  [79-97] 93 (03/20 1602) ?Cardiac Rhythm: Normal sinus rhythm (03/20 0703) ?Resp:  [14-20] 20 (03/20 1243) ?BP: (149-182)/(68-92) 182/79 (03/20 1602) ?SpO2:  [96 %-100 %] 100 % (03/20 1602) ? ?Recent Labs  ?Lab 01/14/22 ?2028 01/15/22 ?1021 01/15/22 ?0803 01/15/22 ?1248 01/15/22 ?1448  ?GLUCAP 217* 216* 262* 253* 278*  ? ?Recent Labs  ?Lab 01/13/22 ?1347 01/14/22 ?0216 01/15/22 ?1173  ?NA 134* 131* 136  ?K 4.8 4.4 4.1  ?CL 95* 95* 104  ?CO2 29 25 26   ?GLUCOSE 494* 399* 184*  ?BUN 25* 27* 25*  ?CREATININE 1.26* 1.42* 1.31*  ?CALCIUM 9.4 8.8* 8.4*  ? ?Recent Labs  ?Lab 01/14/22 ?0216  ?AST 16  ?ALT 16  ?ALKPHOS 75  ?BILITOT 0.5  ?PROT 6.3*  ?ALBUMIN 2.8*  ? ?Recent Labs  ?Lab 01/13/22 ?1347 01/14/22 ?0216  ?WBC 6.8 8.0  ?NEUTROABS 4.8  --   ?HGB 14.3 12.0  ?HCT 44.1 36.5  ?MCV 98.9 97.1  ?PLT 151 181  ? ?No results for input(s): CKTOTAL, CKMB, CKMBINDEX, TROPONINI in the last 168 hours. ?Recent Labs  ?  01/13/22 ?1347  ?LABPROT 13.9  ?INR 1.1  ? ?No results for input(s): COLORURINE, LABSPEC, PHURINE, GLUCOSEU, HGBUR, BILIRUBINUR, KETONESUR, PROTEINUR, UROBILINOGEN, NITRITE, LEUKOCYTESUR in the last 72 hours. ? ?Invalid input(s): APPERANCEUR  ?   ?Component Value Date/Time  ? CHOL 184 01/13/2022 1347  ? CHOL 234 (H) 11/18/2019 1607  ? TRIG 82 01/13/2022 1347  ? HDL 53 01/13/2022 1347  ? HDL 64 11/18/2019 1607  ? CHOLHDL 3.5 01/13/2022 1347  ? VLDL 16 01/13/2022 1347  ? LDLCALC 115 (H) 01/13/2022 1347  ? LDLCALC 153 (H) 11/18/2019 1607  ? ?Lab Results  ?Component Value Date  ? HGBA1C 13.1 (H) 01/14/2022  ? ?No results found for: LABOPIA, COCAINSCRNUR, LABBENZ, AMPHETMU, THCU, LABBARB  ?No results for  input(s): ETH in the last 168 hours. ? ?I have personally reviewed the radiological images below and agree with the radiology interpretations. ? ?CT ANGIO HEAD NECK W WO CM ? ?Result Date: 01/13/2022 ?CLINICAL DATA:  Right-sided weakness. EXAM: CT ANGIOGRAPHY HEAD AND NECK TECHNIQUE: Multidetector CT imaging of the head and neck was performed using the standard protocol during bolus administration of intravenous contrast. Multiplanar CT image reconstructions and MIPs were obtained to evaluate the vascular anatomy. Carotid stenosis measurements (when applicable) are obtained utilizing NASCET criteria, using the distal internal carotid diameter as the denominator. RADIATION DOSE REDUCTION: This exam was performed according to the departmental dose-optimization program which includes automated exposure control, adjustment of the mA and/or kV according to patient size and/or use of iterative reconstruction technique. CONTRAST:  55mL OMNIPAQUE IOHEXOL 350 MG/ML SOLN COMPARISON:  Same day CT head. FINDINGS: CTA NECK FINDINGS Aortic arch: Great vessel origins are patent without significant stenosis. Right carotid system: Atherosclerosis at the carotid bifurcation without greater than 50% stenosis. Retropharyngeal course of the ICA. Left carotid system: Atherosclerosis at the carotid bifurcation without greater than 50% stenosis. Retropharyngeal course of the ICA. Vertebral arteries: Bilateral intradural vertebral arteries are patent with multifocal mild atherosclerotic narrowing. Skeleton: Multilevel degenerative disc disease, greatest at C5-C6 where there is a central calcified disc protrusion. Other neck: No  acute abnormality. Upper chest: Visualized lung apices are clear. Review of the MIP images confirms the above findings CTA HEAD FINDINGS Anterior circulation: Bilateral intracranial ICAs are patent with mild to moderate narrowing from atherosclerosis. Right M1 MCAs patent. Occlusion versus severe stenosis of a  proximal right M2 MCA branch with distal reconstitution (for example see series 9, images 87 through 90 and series 6, images 236 and 237). Left M1 MCAs patent with early bifurcation. Severe stenosis of a proximal left M 2 MCA branch. Bilateral ACAs are patent. Posterior circulation: Moderate stenosis of left vertebral artery at its dural margin. Vertebral arteries are patent. Small fenestration of the proximal basilar artery, anatomic variant. Basilar artery is patent. Small vertebrobasilar system with bilateral fetal type PCAs, anatomic variant. Multifocal mild-to-moderate stenosis of bilateral P2 PCAs. Venous sinuses: As permitted by contrast timing, patent. Anatomic variants: Detailed above. Review of the MIP images confirms the above findings IMPRESSION: 1. Occlusion versus severe stenosis of a proximal right M2 MCA branch with distal reconstitution. 2. Severe proximal left M2 MCA stenosis. 3. Moderate stenosis of the left vertebral artery at its dural margin. 4. Mild-to-moderate bilateral intracranial ICA and bilateral P2 PCA stenoses. Findings discussed with Dr. Renaye Rakersrifan via telephone at 6:42 p.m. Electronically Signed   By: Feliberto HartsFrederick S Jones M.D.   On: 01/13/2022 18:58  ? ?CT HEAD WO CONTRAST (5MM) ? ?Result Date: 01/13/2022 ?CLINICAL DATA:  Stroke follow-up. Right arm paralysis. The right-sided weakness began January 08, 2022. Altered gait. EXAM: CT HEAD WITHOUT CONTRAST TECHNIQUE: Contiguous axial images were obtained from the base of the skull through the vertex without intravenous contrast. RADIATION DOSE REDUCTION: This exam was performed according to the departmental dose-optimization program which includes automated exposure control, adjustment of the mA and/or kV according to patient size and/or use of iterative reconstruction technique. COMPARISON:  September 23, 2019 CT scan of the brain FINDINGS: Brain: No subdural, epidural, or subarachnoid hemorrhage identified. 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 The remainder of the cerebellum is normal in appearance. The brainstem and basal cisterns are normal. Ventricles and sulci are stable. No mass effect or midline shift. Periventricular white matter changes are again identified, stable on the right but increased on the left in the interval. The increased white matter changes on the left involve the coronal radiata and abut the body of the left caudate. The overlying cortex is intact. There is a lacunar infarct in the left thalamus not identified previously as seen on series 4, image 16. No other evidence of acute ischemia or infarct. Ventricles and sulci are stable. Vascular: Calcified atherosclerosis is seen in the intracranial carotids. Skull: Normal. Negative for fracture or focal lesion. Sinuses/Orbits: No acute finding. Other: None. IMPRESSION: 1. New white matter changes on the left primarily involving the corona radiata, abutting the body of the left caudate. There is also a lacunar infarct in the left thalamus not identified on the previous study. The overlying cortex is intact. These findings likely are the cause of the patient's right-sided symptoms. 2. No other acute abnormalities. Electronically Signed   By: Gerome Samavid  Williams III M.D.   On: 01/13/2022 16:15  ? ?MR ANGIO HEAD WO CONTRAST ? ?Result Date: 01/14/2022 ?CLINICAL DATA:  66 year old female with evidence of progressed small vessel disease since 2020 on head CT yesterday. CTA head and neck positive for occluded or severely stenotic proximal bilateral MCA M2 branches. Confluent left corona radiata infarct on MRI today. EXAM: MRA HEAD WITHOUT CONTRAST TECHNIQUE: Angiographic  images of the Circle of Willis were acquired using MRA technique without intravenous contrast. COMPARISON:  Brain MRI earlier today.  CTA head and neck yesterday. FINDINGS: Anterior circulation: Antegrade flow in both ICA siphons with bilateral siphon irregularity in keeping with atherosclerosis  demonstrated by CTA yesterday. Moderate right petrous segment stenosis appears to be genuine when compared to the CTA (series 1049, image 9). No hemodynamically significant left siphon stenosis. Patent carotid termi

## 2022-01-15 NOTE — Plan of Care (Signed)
?  RD consulted for nutrition education regarding diabetes and eating after stroke. Pt unavailable at time of RD visit. RD attached education material to pt's discharge instructions and will follow-up tomorrow, 3/21, to re-attempt education and assessment.   ? ?Lab Results  ?Component Value Date  ? HGBA1C 13.1 (H) 01/14/2022  ? ?Body mass index is 56.25 kg/m?Marland Kitchen Pt meets criteria for morbid obesity based on current BMI. ? ?Current diet order is heart healthy/carb modified, no po intake documented thus far. Labs and medications reviewed. RD will re-attempt education and assessment tomorrow.  ? ? ?Rae Lips., MS, RD, LDN (she/her/hers) ?RD pager number and weekend/on-call pager number located in Amion. ? ? ?

## 2022-01-15 NOTE — PMR Pre-admission (Signed)
PMR Admission Coordinator Pre-Admission Assessment ? ?Patient: Samantha [name] is an 66 y.o., female ?MRN: [id number] ?DOB: 03-Aug-1956 ?Height: 5' (152.4 cm) ?Weight: 130.6 kg ? ?Insurance Information ?HMO:     PPO:      PCP:      IPA:      80/20:      OTHER:  ?PRIMARY: [employer]      Policy#: [id number]      Subscriber: pt ?CM Name: Carey Bullocks.      Phone#: [phone]     Fax#: [phone] ?Pre-Cert#: [id number] approved for 7 days and f/u with House phone [phone] fax [phone]      Employer:  ?Benefits:  Phone #: [phone]     Name: 3/21 ?Eff. Date: 10/29/2021     Deduct: $8800      Out of Pocket Max: $9100      Life Max: none ?CIR: 50%      SNF: 50% 90 days ?Outpatient: 50%     Co-Pay: per medical neccesity ?Home Health: 50%      Co-Pay: per medical neccesity ?DME: 50%     Co-Pay: 50% ?Providers: in network ? ?SECONDARY: none ? ?Financial Counselor:       Phone#:  ? ?The ?Data Collection Information Summary? for patients in Inpatient Rehabilitation Facilities with attached ?Privacy Act [place] Records? was provided and verbally reviewed with: N/A ? ?Emergency Contact Information ?Contact Information   ? ? Name Relation Home Work Mobile  ? Isabellagrace, Oum Daughter   [phone]  ? Hesse,[name] Spouse   [phone]  ? ?  ? ?Current Medical History  ?Patient Admitting Diagnosis: CVA ? 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   Presented 01/13/2022 with right side weakness of acute onset.  Cranial CT scan showed new white matter changes on the left primarily involving the corona radiata abutting the body of the left caudate.  There was also a lacunar infarct in the left thalamus not identified on previous study.  CT angiogram head and neck occlusion versus severe stenosis of proximal right M2 MCA  branch with distal reconstitution.  Severe proximal left M2 MCA stenosis.  MRI of the brain 01/14/2022 confluent acute white matter infarct of the left corona radiata.  No associated hemorrhage or mass effect.  Superimposed punctate acute on chronic right cerebellar SCA territory ischemia.  Admission chemistry sodium 134 glucose 494 BUN 25 creatinine 1.26, troponin 25, hemoglobin A1c 13.1.  Echocardiogram with ejection fraction of 55 to 60% no wall motion abnormalities.  Currently maintained on aspirin 325 mg daily and Plavix 75 mg daily for CVA prophylaxis x3 months then aspirin alone given intracranial severe stenosis.  Lovenox for DVT prophylaxis.  Tolerating a regular diet.   ?  ?Complete NIHSS TOTAL: 9 ? ?Patient's medical record from [facility] has been reviewed by the rehabilitation admission coordinator and physician. ? ?Past Medical History  ?History reviewed. No pertinent past medical history. ? ?Has the patient had major surgery during 100 days prior to admission? No ? ?Family History   ?family history includes Hypertension in her mother. ? ?Current Medications ? ?Current Facility-Administered Medications:  ?  acetaminophen (TYLENOL) tablet 650 mg, 650 mg, Oral, Q4H PRN **OR** acetaminophen (TYLENOL) 160 MG/5ML solution 650 mg, 650 mg, Per Tube, Q4H PRN **OR** acetaminophen (TYLENOL) suppository 650 mg, 650 mg, Rectal, Q4H PRN, Tamala Julian, Rondell  A, MD ?  amLODipine (NORVASC) tablet 10 mg, 10 mg, Oral, Daily, [place] Donnamarie Poag, DO, 10 mg at 01/17/22 1037 ?  aspirin EC tablet 325 mg, 325 mg, Oral, Daily, Rosalin Hawking, MD, 325 mg at 01/17/22 1037 ?  atorvastatin (LIPITOR) tablet 80 mg, 80 mg, Oral, q1800, Fuller Plan A, MD, 80 mg at 01/16/22 1723 ?  clopidogrel (PLAVIX) tablet 75 mg, 75 mg, Oral, Daily, Tamala Julian, Rondell A, MD, 75 mg at 01/17/22 1037 ?  enoxaparin (LOVENOX) injection 40 mg, 40 mg, Subcutaneous, Q24H, Smith, Rondell A, MD, 40 mg at 01/16/22 1723 ?  insulin aspart (novoLOG) injection 0-15  Units, 0-15 Units, Subcutaneous, TID WC, Norval Morton, MD, 5 Units at 01/17/22 [id number] ?  insulin aspart (novoLOG) injection 0-5 Units, 0-5 Units, Subcutaneous, QHS, Norval Morton, MD, 2 Units at 01/14/22 2100 ?  insulin aspart (novoLOG) injection 6 Units, 6 Units, Subcutaneous, TID WC, [place] Eric J, DO, 6 Units at 01/17/22 1037 ?  insulin glargine-yfgn (SEMGLEE) injection 45 Units, 45 Units, Subcutaneous, Daily, [place] Eric J, [place], [place] Units at 01/17/22 1038 ?  sodium chloride 0.9 % bolus 500 mL, 500 mL, Intravenous, Once, Trifan, Carola Rhine, MD ? ?Patients Current Diet:  ?Diet Order   ? ?       ?  Diet heart healthy/carb modified Room service appropriate? Yes; Fluid consistency: Thin  Diet effective now       ?  ? ?  ?  ? ?  ? ?Precautions / Restrictions ?Precautions ?Precautions: Fall ?Restrictions ?Weight Bearing Restrictions: No  ? ?Has the patient had 2 or more falls or a fall with injury in the past year? No ? ?Prior Activity Level ?Community (5-7x/wk): independent ? ?Prior Functional Level ?Self Care: Did the patient need help bathing, dressing, using the toilet or eating? Independent ? ?Indoor Mobility: Did the patient need assistance with walking from room to room (with or without device)? Independent ? ?Stairs: Did the patient need assistance with internal or external stairs (with or without device)? Independent ? ?Functional Cognition: Did the patient need help planning regular tasks such as shopping or remembering to take medications? Independent ? ?Patient Information ?Are you of Hispanic, Latino/a,or Spanish origin?: A. No, not of Hispanic, Latino/a, or Spanish origin ?What is your race?: B. Black or African American ?Do you need or want an interpreter to communicate with a doctor or health care staff?: 0. No ? ?Patient's Response To:  ?Health Literacy and Transportation ?Is the patient able to respond to health literacy and transportation needs?: Yes ?Health Literacy - How often do you need to  have someone help you when you read instructions, pamphlets, or other written material from your doctor or pharmacy?: Never ?In the past 12 months, has lack of transportation kept you from medical appointments or from getting medications?: No ?In the past 12 months, has lack of transportation kept you from meetings, work, or from getting things needed for daily living?: No ? ?Home Assistive Devices / Equipment ?Home Assistive Devices/Equipment: None ?Home Equipment: Rolling Walker (2 wheels), BSC/3in1 ? ?Prior Device Use: Indicate devices/aids used by the patient prior to current illness, exacerbation or injury? None of the above ? ?Current Functional Level ?Cognition ? Arousal/Alertness: Awake/alert ?Overall Cognitive Status: Impaired/Different from baseline ?Orientation Level: Oriented X4 ?Safety/Judgement: Decreased awareness of safety, Decreased awareness of deficits ?General Comments: At times needing increased time to respond/follow commands ?Attention: Focused, Sustained ?Memory: Impaired ?Memory Impairment: Retrieval deficit, Decreased short term memory (Imediate: 5/5 with 4  repetitions; delayed: 1/5; with cues: 3/4) ?Awareness: Impaired ?Awareness Impairment: Emergent impairment ?Problem Solving: Impaired ?Problem Solving Impairment: Verbal complex (Money: 1/3) ?Executive Function: Sequencing, Organizing ?Organizing: Impaired ?Organizing Impairment: Verbal complex (Backward digit span: 1/2) ?   ?Extremity Assessment ?(includes Sensation/Coordination) ? Upper Extremity Assessment: Generalized weakness, RUE deficits/detail ?RUE Deficits / Details: Flaccid with decreased sensation ?RUE Sensation: decreased light touch ?RUE Coordination: decreased fine motor, decreased gross motor ?LUE Sensation: decreased light touch ?LUE Coordination: decreased fine motor, decreased gross motor  ?Lower Extremity Assessment: Defer to PT evaluation ?LLE Sensation: decreased light touch, decreased proprioception ?LLE  Coordination: decreased fine motor, decreased gross motor  ?  ?ADLs ? Overall ADL's : Needs assistance/impaired ?Eating/Feeding: Set up, Sitting ?Grooming: Minimal assistance, Sitting ?Upper Body Bathing: Moderate as

## 2022-01-15 NOTE — Telephone Encounter (Signed)
Copied from [place] [id number]. Topic: General - Inquiry ?>> Jan 15, 2022  8:38 AM Robina Ade, Helene Kelp D wrote: ?Reason for CRM: Patient daughter Kristin Bruins called and would like to speak with her pcp CMA about patient and her being in the hospital with a stroke. Please call Ms. [name] back. ?

## 2022-01-15 NOTE — Plan of Care (Signed)
?  Problem: Education: ?Goal: Knowledge of General Education information will improve ?Description: Including pain rating scale, medication(s)/side effects and non-pharmacologic comfort measures ?Outcome: Progressing ?  ?Problem: Health Behavior/Discharge Planning: ?Goal: Ability to manage health-related needs will improve ?Outcome: Progressing ?  ?Problem: Clinical Measurements: ?Goal: Ability to maintain clinical measurements within normal limits will improve ?Outcome: Progressing ?Goal: Will remain free from infection ?Outcome: Progressing ?Goal: Diagnostic test results will improve ?Outcome: Progressing ?Goal: Respiratory complications will improve ?Outcome: Progressing ?Goal: Cardiovascular complication will be avoided ?Outcome: Progressing ?  ?Problem: Nutrition: ?Goal: Adequate nutrition will be maintained ?Outcome: Progressing ?  ?Problem: Coping: ?Goal: Level of anxiety will decrease ?Outcome: Progressing ?  ?Problem: Elimination: ?Goal: Will not experience complications related to bowel motility ?Outcome: Progressing ?Goal: Will not experience complications related to urinary retention ?Outcome: Progressing ?  ?Problem: Pain Managment: ?Goal: General experience of comfort will improve ?Outcome: Progressing ?  ?Problem: Safety: ?Goal: Ability to remain free from injury will improve ?Outcome: Progressing ?  ?Problem: Skin Integrity: ?Goal: Risk for impaired skin integrity will decrease ?Outcome: Progressing ?  ?Problem: Education: ?Goal: Knowledge of disease or condition will improve ?Outcome: Progressing ?Goal: Knowledge of secondary prevention will improve (SELECT ALL) ?Outcome: Progressing ?Goal: Knowledge of patient specific risk factors will improve (INDIVIDUALIZE FOR PATIENT) ?Outcome: Progressing ?Goal: Individualized Educational Video(s) ?Outcome: Progressing ?  ?Problem: Coping: ?Goal: Will verbalize positive feelings about self ?Outcome: Progressing ?Goal: Will identify appropriate support  needs ?Outcome: Progressing ?  ?Problem: Health Behavior/Discharge Planning: ?Goal: Ability to manage health-related needs will improve ?Outcome: Progressing ?  ?Problem: Self-Care: ?Goal: Ability to participate in self-care as condition permits will improve ?Outcome: Progressing ?Goal: Verbalization of feelings and concerns over difficulty with self-care will improve ?Outcome: Progressing ?Goal: Ability to communicate needs accurately will improve ?Outcome: Progressing ?  ?Problem: Nutrition: ?Goal: Risk of aspiration will decrease ?Outcome: Progressing ?Goal: Dietary intake will improve ?Outcome: Progressing ?  ?Problem: Ischemic Stroke/TIA Tissue Perfusion: ?Goal: Complications of ischemic stroke/TIA will be minimized ?Outcome: Progressing ?  ?

## 2022-01-15 NOTE — Progress Notes (Addendum)
Inpatient Rehabilitation Admissions Coordinator  ? ?I met at bedside with patient as spouse has gone home per patient. I discussed goals and expectations of a possible Cir admit. She is in agreement. I will begin insurance Auth with [employer] for possible admit. I will speak to spouse upon his return to visit. ? ?Danne Baxter, RN, MSN ?Rehab Admissions Coordinator ?[phone] ?01/15/2022 2:13 PM ? ?[place] of [place] currently inactive for patient. I spoke with her daughter by phone to clarify current payor. Daughter will call patient's pharmacy to see payor listed.I spoke with patient's spouse by phone to review cost of car as uninsured, goals and expectations of a CIR admit. He is in agreement. ? ?Danne Baxter, RN, MSN ?Rehab Admissions Coordinator ?[phone] ?01/15/2022 4:10 PM ? ? ?

## 2022-01-15 NOTE — Progress Notes (Signed)
Physical Therapy Treatment ?Patient Details ?Name: Samantha [name] ?MRN: [id number] ?DOB: [birth year] ?Today's Date: 01/15/2022 ? ? ?History of Present Illness 66 y.o. female  who presents 01/13/22 with a 5-day history of right upper extremity weakness. CT head infarct Lt thalamusand changes in left corona radiata and caudate; 3/19 a.m. increased Rt sided symptoms  PMH significant of hypertension, hyperlipidemia, diabetes mellitus type II; prior rt cerebellar infarct ? ?  ?PT Comments  ? ? Pt making steady progress with mobility. Continue to recommend acute inpatient rehab for further therapy.    ?Recommendations for follow up therapy are one component of a multi-disciplinary discharge planning process, led by the attending physician.  Recommendations may be updated based on patient status, additional functional criteria and insurance authorization. ? ?Follow Up Recommendations ? Acute inpatient rehab (3hours/day) ?  ?  ?Assistance Recommended at Discharge Frequent or constant Supervision/Assistance  ?Patient can return home with the following Two people to help with walking and/or transfers;Help with stairs or ramp for entrance ?  ?Equipment Recommendations ? Wheelchair (measurements PT);Wheelchair cushion (measurements PT);Hospital bed;Other (comment) (hoyer lift)  ?  ?Recommendations for Other Services   ? ? ?  ?Precautions / Restrictions Precautions ?Precautions: Fall ?Restrictions ?Weight Bearing Restrictions: No  ?  ? ?Mobility ? Bed Mobility ?Overal bed mobility: Needs Assistance ?Bed Mobility: Supine to Sit ?  ?  ?Supine to sit: Mod assist, +2 for physical assistance ?  ?  ?General bed mobility comments: Assist to bring RLE off of bed, elevate trunk into sitting and bring hips to EOB ?  ? ?Transfers ?Overall transfer level: Needs assistance ?Equipment used: Ambulation equipment used ?Transfers: Sit to/from Stand, Bed to chair/wheelchair/BSC ?Sit to Stand: +2 physical assistance, Max assist, Mod assist ?  ?  ?  ?  ?   ?General transfer comment: Stood from bed with Stedy with +2 mod assist to bring hips up and for balance. Assist to extend rt hip. Pt with majority of weight on lt side. Used Stedy to go to toilet in batheroom. Pt able to stand from seat of Stedy with +2 min/mod assist. To stand from commode required +2 max assist. Took pt to recliner. ?Transfer via Lift Equipment: Stedy ? ?Ambulation/Gait ?  ?  ?  ?  ?  ?  ?Pre-gait activities: Stood with Stedy x 4 minutes for pericare after BM. Verbal/tactile cues to extend hips and trunk ?  ? ? ?Stairs ?  ?  ?  ?  ?  ? ? ?Wheelchair Mobility ?  ? ?Modified Rankin (Stroke Patients Only) ?Modified Rankin (Stroke Patients Only) ?Pre-Morbid Rankin Score: No symptoms ?Modified Rankin: Severe disability ? ? ?  ?Balance Overall balance assessment: Needs assistance ?Sitting-balance support: Single extremity supported, Feet supported ?Sitting balance-Leahy Scale: Poor ?  ?  ?Standing balance support: Single extremity supported ?Standing balance-Leahy Scale: Poor ?Standing balance comment: Stedy and mod assist for static standing ?  ?  ?  ?  ?  ?  ?  ?  ?  ?  ?  ?  ? ?  ?Cognition Arousal/Alertness: Awake/alert ?Behavior During Therapy: [facility] for tasks assessed/performed ?Overall Cognitive Status: Impaired/Different from baseline ?Area of Impairment: Safety/judgement, Problem solving ?  ?  ?  ?  ?  ?  ?  ?  ?  ?  ?  ?  ?Safety/Judgement: Decreased awareness of safety, Decreased awareness of deficits ?  ?Problem Solving: Slow processing, Requires verbal cues, Requires tactile cues ?  ?  ?  ? ?  ?Exercises   ? ?  ?  General Comments   ?  ?  ? ?Pertinent Vitals/Pain Pain Assessment ?Pain Assessment: No/denies pain  ? ? ?Home Living Family/patient expects to be discharged to:: Private residence ?Living Arrangements: Spouse/significant other;Children ?Available Help at Discharge: Family;Available 24 hours/day ?Type of Home: House ?Home Access: Stairs to enter ?Entrance Stairs-Rails: Can reach  both ?Entrance Stairs-Number of Steps: 3 steps ?  ?Home Layout: One level ?Home Equipment: Rolling Walker (2 wheels);BSC/3in1 ?Additional Comments: 3 steps to enter home; single level once inside  ?  ?Prior Function    ?  ?  ?   ? ?PT Goals (current goals can now be found in the care plan section) Progress towards PT goals: Progressing toward goals ? ?  ?Frequency ? ? ? Min 4X/week ? ? ? ?  ?PT Plan Current plan remains appropriate  ? ? ?Co-evaluation   ?  ?  ?  ?  ? ?  ?AM-PAC PT "6 Clicks" Mobility   ?Outcome Measure ? Help needed turning from your back to your side while in a flat bed without using bedrails?: A Lot ?Help needed moving from lying on your back to sitting on the side of a flat bed without using bedrails?: Total ?Help needed moving to and from a bed to a chair (including a wheelchair)?: Total ?Help needed standing up from a chair using your arms (e.g., wheelchair or bedside chair)?: Total ?Help needed to walk in hospital room?: Total ?Help needed climbing 3-5 steps with a railing? : Total ?6 Click Score: 7 ? ?  ?End of Session   ?Activity Tolerance: Patient tolerated treatment well ?Patient left: in chair;with call bell/phone within reach;with chair alarm set;with nursing/sitter in room ?Nurse Communication: Mobility status;Need for lift equipment (Nurse tech assisted) ?PT Visit Diagnosis: Other abnormalities of gait and mobility (R26.89);Muscle weakness (generalized) (M62.81);Other symptoms and signs involving the nervous system (R29.898) ?  ? ? ?Time: 1212-1232 ?PT Time Calculation (min) (ACUTE ONLY): 20 min ? ?Charges:  $Therapeutic Activity: 8-22 mins          ?          ? ?[facility] PT ?Acute Rehabilitation Services ?Pager [phone] ?Office [phone] ? ? ? ?Angelina Ok [facility] ?01/15/2022, 2:05 PM ? ?

## 2022-01-15 NOTE — Progress Notes (Signed)
Inpatient Diabetes Program Recommendations ? ?AACE/ADA: New Consensus Statement on Inpatient Glycemic Control (2015) ? ?Target Ranges:  Prepandial:   less than 140 mg/dL ?     Peak postprandial:   less than 180 mg/dL (1-2 hours) ?     Critically ill patients:  140 - 180 mg/dL  ? ?Lab Results  ?Component Value Date  ? GLUCAP 262 (H) 01/15/2022  ? HGBA1C 13.1 (H) 01/14/2022  ? ? ?Review of Glycemic Control ? Latest Reference Range & Units 01/14/22 13:19 01/14/22 17:58 01/14/22 20:28 01/15/22 06:09 01/15/22 08:03  ?Glucose-Capillary 70 - 99 mg/dL 256 (H) 221 (H) 217 (H) 216 (H) 262 (H)  ?(H): Data is abnormally high ?Diabetes history: DM2 ?Outpatient Diabetes medications: Amaryl 4 mg qd ?Current orders for Inpatient glycemic control: Semglee 35 units qd, Novolog 0-15 units tid correction + hs 0-5 units +Novolog 3 units TID ? ?Inpatient Diabetes Program Recommendations:   ? ?Spoke with patient briefly regarding outpatient diabetes management. Verified home medication.   ?Reviewed patient's current A1c of 13.1%. Explained what a A1c is and what it measures. Also reviewed goal A1c with patient, importance of good glucose control @ home, and blood sugar goals. Reviewed patho of DM, need for insulin, role of pancreas, vascular changes and commorbidities.  ?Patient will need a meter. Glucose meter kit [id number]. Reviewed recommended frequency of checking CBGs and when to call MD.  ?Admits to drinking juice occasionally. Reviewed alternatives. Dietitian consult placed. ?Briefly educated patient on insulin pen use at home. Reviewed contents of insulin flexpen starter kit. Reviewed all steps if insulin pen including attachment of needle, 2-unit air shot, dialing up dose, giving injection, removing needle, disposal of sharps, storage of unused insulin, disposal of insulin etc. Patient unable to provide successful return demonstration. Also reviewed troubleshooting with insulin pen. MD to give patient Rxs for insulin pens and  insulin pen needles. ?Anticipate need for Novolin 70/30 BID at discharge. ? ?Also, called and spoke with [name], patient's daughter regarding plan for DM management once discharged. [name] is planning to perform insulin injections at home, but she works during the day.  ?Reviewed patient's current A1c of 13.1%. Explained what a A1c is and what it measures. Also reviewed goal A1c with patient, importance of good glucose control @ home, and blood sugar goals. Reviewed patho of DM, need for insulin, role of pancreas, vascular changes and commorbidities.  ?Reviewed survival skills, when to call MD, signs and symptoms of hyper vs hypo glycemia and necessary interventions.  ?Additionally, reviewed plate method, importance of being mindful with CHO intake and limiting sugary beverages.  ?Encouraged to begin reviewing videos and practicing injections once she is with her mother.  ?[name] is planning to come to hospital again on 3/21. Plan to see and further review.  ? ?Thanks, ?Bronson Curb, MSN, RNC-OB ?Diabetes Coordinator ?[phone] (8a-5p) ? ? ?

## 2022-01-15 NOTE — Progress Notes (Signed)
?PROGRESS NOTE ? ? ? Samantha [name]  [id number] DOB: Apr 27, 1956 DOA: 01/13/2022 ?PCP: Trinna Post, PA-C (Inactive)  ? ? ?Brief Narrative:  ?Samantha [name] is a 66 year old female with past medical history significant for type 2 diabetes mellitus, essential hypertension, hyperlipidemia, morbid obesity who presented to [facility] ED on 3/18 via EMS with a 5-day history of right upper extremity weakness.  Her daughter is present at bedside and helps provide additional history as the patient appears to be confused at times.  At baseline patient had been able to ambulate without assistance and was normally alert and oriented.  Since symptoms started patient has had progressively worsening weakness of the right arm until she has not been able to use it at all over the last couple of days.  Her daughter notes that the patient's speech seems to be clear when she initially starts talking, but slurs near the end of her sentences.  Also notes that the patient has been mixing up things like right and left.  Since symptoms started patient has been laying in bed, and seems to be unsteady on her feet when ambulating which is new.  Denies having any palpitations, headache, chest pain, nausea, vomiting, diarrhea symptoms. ?  ?In the ED, temperature of 99 ?F, HR 93-105, RR 16-28, BP elevated up to 182/98, and SPO2 97% on room air.  CT head noted new white matter changes on the left primarily involving the corona radiata abutting the body of the left caudate, and a lacunar infarct of the left thalamus.  Sodium 134, CO2 29, BUN 25, creatinine 1.26, glucose 494, anion gap 10, high-sensitivity troponin 25, and LDL 115.  Neurology have been consulted and recommended completion of stroke work-up.  Patient was not a candidate for tPA as she was out of the window.  CTA of the head and neck was ordered, but delayed as patient's IV infiltrated.  TRH consulted for further evaluation and management.  ? ? ?  ?Assessment and Plan: ?* CVA (cerebral  vascular accident) ([place]) ?Patient presenting with a 5-day history of paralysis of her right arm/leg, slurred speech, gait disturbance, and confusion.  CT head concerning for new white matter changes on the left primarily involving the corona radiata abutting the body of the left caudate, and a lacunar infarct of the left thalamus.  She was out of the window for tPA.  Risk factors include hypertension, hyperlipidemia, poorly controlled DM type II, and obesity.  More brain without contrast with confluent acute white matter infarct left corona radiata without hemorrhage/mass effect; superimposed punctate acute on chronic right cerebellar SCA territory ischemia without hemorrhage/mass effect, chronic left thalamic lacune with moderately advanced bilateral white matter disease.  CT angiogram head/neck with occlusion versus severe stenosis proximal right M2 MCA branch with distal reconstitution, severe proximal left M2 MCA stenosis, moderate stenosis left vertebral artery, mild/moderate bilateral intracranial ICA and bilateral P2 PCA stenosis.  Hemoglobin A1c 13.1, LDL 115. ?--Neurology following, appreciate assistance ?--MRA head without contrast: Pending ?--TTE: LVEF 55-60%, no regional wall motion abnormalities, IVC normal, no interatrial shunt visualized ?--PT/OT/SLP: Recommend CIR ?--Aspirin 81 mg p.o. daily ?--Plavix 75 mg p.o. daily ?--Atorvastatin 80 mg p.o. daily ?--Continue to monitor on telemetry ? ? ? ?Type 2 diabetes mellitus without complication, without long-term current use of insulin ([place]) ?Patient is initial glucose elevated at 494 without elevated anion gap.  Home medication regimen includes Amaryl 4 mg PO daily.  Hemoglobin A1c 13.1, poorly controlled. ?--Diabetic educator following ?--Hold Amaryl ?--Increase Semglee to  35 units daily ?--NovoLog 3 units TIDAC ?--moderate SSI for coverage ?--CBGs qAC/HS ? ? ?AKI (acute kidney injury) ([place]) ?Creatinine elevated at 1.26 with BUN 25 on admission.  Baseline  creatinine previously had been within normal limits back in 2021.  Given hyperglycemia suspect prerenal cause of symptoms versus progression now to CKD given poorly controlled HTN and DM2. Renal ultrasound with no evidence of hydronephrosis, left kidney smaller than right with lobular contour suggesting cortical scarring from prior infection, obstruction or ischemia.  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 ?--Cr 1.24>1.42>1.31 ?--Avoid nephrotoxins, renally dose all medications ?--BMP daily ? ?Hypertension ?On admission blood pressures elevated up to 182/98.  Home blood pressure regimen includes amlodipine 10 mg daily, hydrochlorothiazide 25 mg daily, and lisinopril 40 mg daily. ?--Holding home amlodipine 10mg  PO daily to allow permissive hypertension in the setting of acute CVA above ?--holding HCTZ and lisinopril due to AKI ?--Allowing permissive hypertension in the setting of acute CVA; okay for BP >220/120 next 24h. ? ? ?Hyperlipidemia associated with type 2 diabetes mellitus ([place]) ?On admission LDL 115.  Home medication regimen included atorvastatin 10 mg daily. ?--Goal LDL less than 70 ?--Increased atorvastatin to 80 mg daily ? ?Hyponatremia ?Sodium just mildly low at 134.  This appears to be a pseudohyponatremia secondary to patient's hyperglycemia versus home HCTZ use. ?--Continue to monitor BMP daily, aggressive management of DM2 ?-- Holding home hydrochlorothiazide ? ?Elevated troponin ?High-sensitivity troponin elevated at 25>37.  Remains chest pain-free.  EKG with NSR, rate 90, QTc 474, T wave inversion 1, aVL, no other concerning dynamic changes.  No previous EKG available for review in EMR. ?--Continue to monitor on telemetry ? ?Morbid obesity (Samantha [name]) ?Body mass index is 56.25 kg/m?Marland Kitchen  Discussed with patient needs for aggressive lifestyle changes/weight loss as this complicates all facets of care.  Outpatient follow-up with PCP.  May benefit from bariatric evaluation outpatient. ? ? ? ? ?DVT prophylaxis: enoxaparin  (LOVENOX) injection 40 mg Start: 01/13/22 1800 ? ?  Code Status: Full Code ?Family Communication: Husband present at bedside ? ?Disposition Plan:  ?Level of care: Telemetry Medical ?Status is: Inpatient ?Remains inpatient appropriate because: Pending CIR evaluation ?  ? ?Consultants:  ?Neurology ? ?Procedures:  ?TTE: ? ?Antimicrobials:  ?None ? ? ?Subjective: ?Patient seen examined bedside, resting comfortably, lying in bed.  Spouse present.  No specific complaints this morning.  Continues with extreme weakness to right upper extremity, unable to move and significant weakness to right lower extremity.  No other questions or concerns at this time.  Denies headache, no dizziness, no chest pain, no palpitations, no shortness of breath, no abdominal pain, no fever/chills/night sweats, no nausea/vomiting/diarrhea, no fatigue.  No acute concerns overnight per nursing staff. ? ?Objective: ?Vitals:  ? 01/14/22 2027 01/14/22 2350 01/15/22 0400 01/15/22 0828  ?BP: (!) 169/71 (!) 149/68 (!) 159/81 (!) 162/89  ?Pulse: 91 83 79 96  ?Resp: 14 14 14 17   ?Temp: 99.1 ?F (37.3 ?C) 97.9 ?F (36.6 ?C) 98.4 ?F (36.9 ?C) 99 ?F (37.2 ?C)  ?TempSrc: Oral Oral Oral Oral  ?SpO2: 99% 97% 96% 97%  ?Height:      ? ? ?Intake/Output Summary (Last 24 hours) at 01/15/2022 0959 ?Last data filed at 01/15/2022 0400 ?Gross per 24 hour  ?Intake 1398.91 ml  ?Output 900 ml  ?Net 498.91 ml  ? ?There were no vitals filed for this visit. ? ?Examination: ? ?Physical Exam: ?GEN: NAD, alert and oriented x 3, morbidly obese ?HEENT: NCAT, PERRL, EOMI, sclera clear, MMM ?PULM:  CTAB w/o wheezes/crackles, normal respiratory effort, on room air ?CV: RRR w/o M/G/R ?GI: abd soft, NTND, NABS, no R/G/M ?MSK: no peripheral edema, right lower extremity muscle strength 2/5, right upper extremity muscle strength 0/5 ?NEURO: Slight right-sided facial droop noted, otherwise CN II-XII intact, decreased sensation in comparison right upper/lower extremity  ?PSYCH: normal  mood/affect ?Integumentary: dry/intact, no rashes or wounds ? ? ? ?Data Reviewed: I have personally reviewed following labs and imaging studies ? ?CBC: ?Recent Labs  ?Lab 01/13/22 ?1347 01/14/22 ?0216  ?WBC 6.8

## 2022-01-16 ENCOUNTER — Encounter (HOSPITAL_COMMUNITY): Payer: Self-pay | Admitting: Internal Medicine

## 2022-01-16 LAB — GLUCOSE, CAPILLARY
Glucose-Capillary: 207 mg/dL — ABNORMAL HIGH (ref 70–99)
Glucose-Capillary: 128 mg/dL — ABNORMAL HIGH (ref 70–99)
Glucose-Capillary: 99 mg/dL (ref 70–99)
Glucose-Capillary: 155 mg/dL — ABNORMAL HIGH (ref 70–99)

## 2022-01-16 LAB — BASIC METABOLIC PANEL
Anion gap: 8 (ref 5–15)
BUN: 26 mg/dL — ABNORMAL HIGH (ref 8–23)
CO2: 23 mmol/L (ref 22–32)
Calcium: 8.7 mg/dL — ABNORMAL LOW (ref 8.9–10.3)
Chloride: 105 mmol/L (ref 98–111)
Creatinine, Ser: 1.16 mg/dL — ABNORMAL HIGH (ref 0.44–1.00)
GFR, Estimated: 53 mL/min — ABNORMAL LOW (ref >60)
Glucose, Bld: 96 mg/dL (ref 70–99)
Potassium: 4.4 mmol/L (ref 3.5–5.1)
Sodium: 136 mmol/L (ref 135–145)

## 2022-01-16 LAB — MAGNESIUM: Magnesium: 2.1 mg/dL (ref 1.7–2.4)

## 2022-01-16 MED ORDER — INSULIN ASPART 100 UNIT/ML IJ SOLN
6.0000 [IU] | Freq: Three times a day (TID) | INTRAMUSCULAR | Status: DC
  Administered 2022-01-16 – 2022-01-17 (×5): 6 [IU] via SUBCUTANEOUS

## 2022-01-16 MED ORDER — INSULIN GLARGINE-YFGN 100 UNIT/ML ~~LOC~~ SOLN
45.0000 [IU] | Freq: Every day | SUBCUTANEOUS | Status: DC
  Administered 2022-01-16 – 2022-01-17 (×2): 45 [IU] via SUBCUTANEOUS
  Filled 2022-01-16 (×2): qty 0.45

## 2022-01-16 MED ORDER — AMLODIPINE BESYLATE 10 MG PO TABS
10.0000 mg | ORAL_TABLET | Freq: Every day | ORAL | Status: DC
  Administered 2022-01-16 – 2022-01-17 (×2): 10 mg via ORAL
  Filled 2022-01-16 (×2): qty 1

## 2022-01-16 NOTE — Progress Notes (Signed)
Inpatient Rehabilitation Admissions Coordinator  ? ?I have left a voicemail for pt's daughter, Lorina Rabon, to clarify what she has found out about her current insurance as she is not covered by [employer] [place]. ? ?Ottie Glazier, RN, MSN ?Rehab Admissions Coordinator ?([phone] ?01/16/2022 11:22 AM ? ?

## 2022-01-16 NOTE — Progress Notes (Signed)
Physical Therapy Treatment ?Patient Details ?Name: Samantha [name] ?MRN: [id number] ?DOB: [birth year] ?Today's Date: 01/16/2022 ? ? ?History of Present Illness 66 y.o. female  who presents 01/13/22 with a 5-day history of right upper extremity weakness. CT head infarct Lt thalamusand changes in left corona radiata and caudate; 3/19 a.m. increased Rt sided symptoms  PMH significant of hypertension, hyperlipidemia, diabetes mellitus type II; prior rt cerebellar infarct ? ?  ?PT Comments  ? ? Pt making steady progress with mobility and balance. Remains motivated to maximize her independence. Continue to recommend acute inpatient rehab.    ?Recommendations for follow up therapy are one component of a multi-disciplinary discharge planning process, led by the attending physician.  Recommendations may be updated based on patient status, additional functional criteria and insurance authorization. ? ?Follow Up Recommendations ? Acute inpatient rehab (3hours/day) ?  ?  ?Assistance Recommended at Discharge Frequent or constant Supervision/Assistance  ?Patient can return home with the following Two people to help with walking and/or transfers;Help with stairs or ramp for entrance ?  ?Equipment Recommendations ? Wheelchair (measurements PT);Wheelchair cushion (measurements PT);Hospital bed;Other (comment) (hoyer lift)  ?  ?Recommendations for Other Services   ? ? ?  ?Precautions / Restrictions Precautions ?Precautions: Fall ?Restrictions ?Weight Bearing Restrictions: No  ?  ? ?Mobility ? Bed Mobility ?Overal bed mobility: Needs Assistance ?Bed Mobility: Supine to Sit ?  ?  ?Supine to sit: Mod assist ?  ?  ?General bed mobility comments: Assist to bring RLE off of bed, elevate trunk into sitting and bring hips to EOB ?  ? ?Transfers ?Overall transfer level: Needs assistance ?Equipment used: Ambulation equipment used ?Transfers: Sit to/from Stand, Bed to chair/wheelchair/BSC ?Sit to Stand: +2 physical assistance, Mod assist ?  ?  ?  ?  ?   ?General transfer comment: Stood from bed with Stedy with +2 mod assist to bring hips up and for balance. Assist to extend rt hip. Pt with majority of weight on lt side. Used Stedy to go to [employer]. Pt able to stand from seat of Stedy with +2 mod assist. ?Transfer via Lift Equipment: Stedy ? ?Ambulation/Gait ?  ?  ?  ?  ?  ?  ?Pre-gait activities: Stood with [employer] and worked on facilitating rt hip, knee and trunk extension ?  ? ? ?Stairs ?  ?  ?  ?  ?  ? ? ?Wheelchair Mobility ?  ? ?Modified Rankin (Stroke Patients Only) ?Modified Rankin (Stroke Patients Only) ?Pre-Morbid Rankin Score: No symptoms ?Modified Rankin: Severe disability ? ? ?  ?Balance Overall balance assessment: Needs assistance ?Sitting-balance support: Feet supported, No upper extremity supported ?Sitting balance-Leahy Scale: Fair ?Sitting balance - Comments: Worked on reaching with LUE in all directions with min guard assist ?  ?Standing balance support: Single extremity supported ?Standing balance-Leahy Scale: Poor ?Standing balance comment: Stedy and mod assist for static standing ?  ?  ?  ?  ?  ?  ?  ?  ?  ?  ?  ?  ? ?  ?Cognition Arousal/Alertness: Awake/alert ?Behavior During Therapy: [facility] for tasks assessed/performed ?Overall Cognitive Status: Impaired/Different from baseline ?Area of Impairment: Problem solving ?  ?  ?  ?  ?  ?  ?  ?  ?  ?  ?  ?  ?  ?  ?Problem Solving: Slow processing, Requires verbal cues, Requires tactile cues ?  ?  ?  ? ?  ?Exercises   ? ?  ?General Comments   ?  ?  ? ?  Pertinent Vitals/Pain    ? ? ?Home Living   ?  ?  ?  ?  ?  ?  ?  ?  ?  ?   ?  ?Prior Function    ?  ?  ?   ? ?PT Goals (current goals can now be found in the care plan section) Progress towards PT goals: Progressing toward goals ? ?  ?Frequency ? ? ? Min 4X/week ? ? ? ?  ?PT Plan Current plan remains appropriate  ? ? ?Co-evaluation   ?  ?  ?  ?  ? ?  ?AM-PAC PT "6 Clicks" Mobility   ?Outcome Measure ? Help needed turning from your back to your side while  in a flat bed without using bedrails?: A Lot ?Help needed moving from lying on your back to sitting on the side of a flat bed without using bedrails?: A Lot ?Help needed moving to and from a bed to a chair (including a wheelchair)?: Total ?Help needed standing up from a chair using your arms (e.g., wheelchair or bedside chair)?: Total ?Help needed to walk in hospital room?: Total ?Help needed climbing 3-5 steps with a railing? : Total ?6 Click Score: 8 ? ?  ?End of Session   ?Activity Tolerance: Patient tolerated treatment well ?Patient left: in chair;with call bell/phone within reach;with chair alarm set ?Nurse Communication: Mobility status;Need for lift equipment (Nurse tech assisted) ?PT Visit Diagnosis: Other abnormalities of gait and mobility (R26.89);Muscle weakness (generalized) (M62.81);Other symptoms and signs involving the nervous system (R29.898) ?  ? ? ?Time: 0923-3007 ?PT Time Calculation (min) (ACUTE ONLY): 15 min ? ?Charges:  $Therapeutic Activity: 8-22 mins          ?          ? ?[facility] PT ?Acute Rehabilitation Services ?Pager [phone] ?Office [phone] ? ? ? ?Samantha [name] [facility] ?01/16/2022, 12:39 PM ? ?

## 2022-01-16 NOTE — Progress Notes (Signed)
Inpatient Diabetes Program Recommendations ? ?AACE/ADA: New Consensus Statement on Inpatient Glycemic Control ? ?Target Ranges:  Prepandial:   less than 140 mg/dL ?     Peak postprandial:   less than 180 mg/dL (1-2 hours) ?     Critically ill patients:  140 - 180 mg/dL  ? ? Latest Reference Range & Units 01/15/22 08:03 01/15/22 12:48 01/15/22 14:48 01/15/22 18:02 01/15/22 21:09  ?Glucose-Capillary 70 - 99 mg/dL 518 (H) 841 (H) 660 (H) 279 (H) 155 (H)  ? ? Latest Reference Range & Units 01/16/22 06:14 01/16/22 12:29  ?Glucose-Capillary 70 - 99 mg/dL 630 (H) 160 (H)  ? ?Review of Glycemic Control ? ?Diabetes history: DM2 ?Outpatient Diabetes medications: Amaryl 4 mg daily ?Current orders for Inpatient glycemic control: Semglee 45 units daily, Novolog 0-15 units TID with meals, Novolog 0-5 units QHS, Novolog 6 units TID with meals ? ?NOTE: Spoke with patient regarding insulin. Patient reports that she will be going to rehab and she is hopeful to get more movement in her right hand and arm. Patient is not able to move right arm or hand at this time so her ability to use the insulin pen is limited. Patient states that when she goes home, she will always have someone there with her. Patient states that she remembers someone going over the insulin pen with her yesterday but she does not remember the steps of using the insulin pen. Reviewed each step of using the insulin pen and had patient demonstrate how to use the insulin pen step by step. Patient needed several verbal cues of proper steps. Asked patient to be sure family helps with insulin injections and patient again states that she will have someone with her at all times so they would be able to help her. Reviewed glucose and A1C goals and stressed importance of DM control to decrease risk of further complications from uncontrolled DM. Reviewed hypoglycemia along with treatment. Encouraged patient to check glucose 2-3 times at home and be sure to take glucometer or  glucose log with her to appointments. Patient verbalized understanding of information discussed and states she has no questions at this time.  Would strongly encourage family to assist with insulin administration each time once patient is able to go home. While patient is in CIR, please encourage nursing staff to work with patient and family on insulin administration and allow family to administer some of the insulin injections with nursing supervision.  ? ?Thanks, ?Orlando Penner, RN, MSN, CDE ?Diabetes Coordinator ?Inpatient Diabetes Program ?[phone] (Team Pager from 8am to 5pm) ? ? ?

## 2022-01-16 NOTE — Progress Notes (Addendum)
Inpatient Rehabilitation Admissions Coordinator  ? ?I contacted daughter, Lorina Rabon and we are to meet at 2 pm to clarify payor source. ? ?Ottie Glazier, RN, MSN ?Rehab Admissions Coordinator ?[phone] ?01/16/2022 12:52 PM ? ?Daughter has provided me with her insurance information of [employer] policy number [id number] . I have provided this information to the pre service center as well as begun Auth for a possible Cir admit. ? ?Ottie Glazier, RN, MSN ?Rehab Admissions Coordinator ?([phone] ?01/16/2022 3:25 PM ? ? ?

## 2022-01-16 NOTE — Progress Notes (Signed)
Speech Language Pathology Treatment: Cognitive-Linquistic (dysarthria)  ?Patient Details ?Name: Samantha [name] ?MRN: [id number] ?DOB: 09-24-1956 ?Today's Date: 01/16/2022 ?Time: 1725-1745 ?SLP Time Calculation (min) (ACUTE ONLY): 20 min ? ?Assessment / Plan / Recommendation ?Clinical Impression ? Patient seen by SLP for skilled speech therapy session focused on dysarthria. SLP presented patient with Grandfather Passage and instructed her to read aloud. She was ablet to produce short phrases/sentences with very good fluency and intelligibility and SLP observed that dysfluencies generally occured from changes in prosody as she would at times abruptly read faster than her average rate. Patient also exhibited speech errors with consonant blends and with precision with lingual placement. Patient appears motivated to improve and plan is for discharge to inpatient rehab next date, pending insurance approval. SLP will continue to follow while she is at acute level of care. ? ?  ?HPI HPI: Pt is a 66 y.o. female who presented with a 5-day history of right upper extremity weakness. 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 MRI brain 3/19: Confluent acute white matter infarct of the left corona radiata. 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  PMH: hypertension, hyperlipidemia, diabetes mellitus type II ?  ?   ?SLP Plan ? Continue with current plan of care ? ?  ?  ?Recommendations for follow up therapy are one component of a multi-disciplinary discharge planning process, led by the attending physician.  Recommendations may be updated based on patient status, additional functional criteria and insurance authorization. ?  ? ?Recommendations  ?   ?   ?    ?   ? ? ? ? Follow Up Recommendations: Acute inpatient rehab (3hours/day) ?Assistance recommended at discharge: Frequent or constant Supervision/Assistance ?SLP Visit Diagnosis: Dysarthria and anarthria (R47.1) ?Plan:  Continue with current plan of care ? ? ? ? ?  ?  ? ? ?Sonia Baller, MA, CCC-SLP ?Speech Therapy ? ?

## 2022-01-16 NOTE — Progress Notes (Signed)
?PROGRESS NOTE ? ? ? Samantha [name]  [id number] DOB: 07/17/1956 DOA: 01/13/2022 ?PCP: Trinna Post, PA-C (Inactive)  ? ? ?Brief Narrative:  ?Samantha [name] is a 66 year old female with past medical history significant for type 2 diabetes mellitus, essential hypertension, hyperlipidemia, morbid obesity who presented to [facility] ED on 3/18 via EMS with a 5-day history of right upper extremity weakness.  Her daughter is present at bedside and helps provide additional history as the patient appears to be confused at times.  At baseline patient had been able to ambulate without assistance and was normally alert and oriented.  Since symptoms started patient has had progressively worsening weakness of the right arm until she has not been able to use it at all over the last couple of days.  Her daughter notes that the patient's speech seems to be clear when she initially starts talking, but slurs near the end of her sentences.  Also notes that the patient has been mixing up things like right and left.  Since symptoms started patient has been laying in bed, and seems to be unsteady on her feet when ambulating which is new.  Denies having any palpitations, headache, chest pain, nausea, vomiting, diarrhea symptoms. ?  ?In the ED, temperature of 99 ?F, HR 93-105, RR 16-28, BP elevated up to 182/98, and SPO2 97% on room air.  CT head noted new white matter changes on the left primarily involving the corona radiata abutting the body of the left caudate, and a lacunar infarct of the left thalamus.  Sodium 134, CO2 29, BUN 25, creatinine 1.26, glucose 494, anion gap 10, high-sensitivity troponin 25, and LDL 115.  Neurology have been consulted and recommended completion of stroke work-up.  Patient was not a candidate for tPA as she was out of the window.  CTA of the head and neck was ordered, but delayed as patient's IV infiltrated.  TRH consulted for further evaluation and management.  ? ? ?  ?Assessment and Plan: ?* CVA (cerebral  vascular accident) ([place]) ?Patient presenting with a 5-day history of paralysis of her right arm/leg, slurred speech, gait disturbance, and confusion.  CT head concerning for new white matter changes on the left primarily involving the corona radiata abutting the body of the left caudate, and a lacunar infarct of the left thalamus.  She was out of the window for tPA.  Risk factors include hypertension, hyperlipidemia, poorly controlled DM type II, and obesity.  More brain without contrast with confluent acute white matter infarct left corona radiata without hemorrhage/mass effect; superimposed punctate acute on chronic right cerebellar SCA territory ischemia without hemorrhage/mass effect, chronic left thalamic lacune with moderately advanced bilateral white matter disease.  CT angiogram head/neck with occlusion versus severe stenosis proximal right M2 MCA branch with distal reconstitution, severe proximal left M2 MCA stenosis, moderate stenosis left vertebral artery, mild/moderate bilateral intracranial ICA and bilateral P2 PCA stenosis.  MRA head without contrast: With advanced intracranial atherosclerosis, moderate stenosis petrous right ICA, moderate/severe stenosis of bilateral MCA M2 branches, moderate stenosis right ACA A1 segment. TTE: LVEF 55-60%, no regional wall motion abnormalities, IVC normal, no interatrial shunt visualized Hemoglobin A1c 13.1, LDL 115.  Neurology was consulted and followed during initial hospital course, now signed off. ?--DAPT w/ Aspirin 325 mg p.o. daily and Plavix 75 mg p.o. daily x 3 months followed by aspirin alone for severe intracranial stenosis. ?--Atorvastatin 80 mg p.o. daily ?--PT/OT/SLP: Recommend CIR, pending insurance authorization ?--Continue to monitor on telemetry ? ? ? ?Type  2 diabetes mellitus without complication, without long-term current use of insulin ([place]) ?Patient is initial glucose elevated at 494 without elevated anion gap.  Home medication regimen includes  Amaryl 4 mg PO daily.  Hemoglobin A1c 13.1, poorly controlled. ?--Diabetic educator following ?--Hold Amaryl ?--Increase Semglee to 45 units daily ?--NovoLog 4 units TIDAC ?--moderate SSI for coverage ?--CBGs qAC/HS ? ? ?AKI (acute kidney injury) ([place]) ?Creatinine elevated at 1.26 with BUN 25 on admission.  Baseline creatinine previously had been within normal limits back in 2021.  Given hyperglycemia suspect prerenal cause of symptoms versus progression now to CKD given poorly controlled HTN and DM2. Renal ultrasound with no evidence of hydronephrosis, left kidney smaller than right with lobular contour suggesting cortical scarring from prior infection, obstruction or ischemia.  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 ?--Cr 1.24>1.42>1.31>1.16 ?--Avoid nephrotoxins, renally dose all medications ?--BMP daily ? ?Hypertension ?On admission blood pressures elevated up to 182/98.  Home blood pressure regimen includes amlodipine 10 mg daily, hydrochlorothiazide 25 mg daily, and lisinopril 40 mg daily. ?--Restart amlodipine 10 mg p.o. daily today ?--holding HCTZ and lisinopril due to AKI ? ? ?Hyperlipidemia associated with type 2 diabetes mellitus ([place]) ?On admission LDL 115.  Home medication regimen included atorvastatin 10 mg daily. ?--Goal LDL less than 70 ?--Increased atorvastatin to 80 mg daily ? ?Hyponatremia ?Sodium just mildly low at 134.  This appears to be a pseudohyponatremia secondary to patient's hyperglycemia versus home HCTZ use. ?--Continue to monitor BMP daily, aggressive management of DM2 ?--Holding home hydrochlorothiazide ? ?Elevated troponin ?High-sensitivity troponin elevated at 25>37.  Remains chest pain-free.  EKG with NSR, rate 90, QTc 474, T wave inversion 1, aVL, no other concerning dynamic changes.  No previous EKG available for review in EMR. ?--Continue to monitor on telemetry ? ?Morbid obesity ([place]) ?Body mass index is 56.25 kg/m?Marland Kitchen  Discussed with patient needs for aggressive lifestyle changes/weight  loss as this complicates all facets of care.  Outpatient follow-up with PCP.  May benefit from bariatric evaluation outpatient. ? ? ? ? ?DVT prophylaxis: enoxaparin (LOVENOX) injection 40 mg Start: 01/13/22 1800 ? ?  Code Status: Full Code ?Family Communication: No family present at bedside this morning ? ?Disposition Plan:  ?Level of care: Telemetry Medical ?Status is: Inpatient ?Remains inpatient appropriate because: Pending insurance authorization for CAR ?  ? ?Consultants:  ?Neurology - signed off 3/20 ? ?Procedures:  ?TTE: ? ?Antimicrobials:  ?None ? ? ?Subjective: ?Patient seen examined bedside, resting comfortably, lying in bed.  No family present.  Continues with significant weakness to right upper/lower extremity awaiting CIR insurance authorization.  No other specific questions or concerns at this time.  Denies headache, no dizziness, no chest pain, no palpitations, no shortness of breath, no abdominal pain, no fever/chills/night sweats, no nausea/vomiting/diarrhea, no fatigue.  No acute concerns overnight per nursing staff. ? ?Objective: ?Vitals:  ? 01/16/22 0148 01/16/22 0327 01/16/22 0720 01/16/22 [id number]  ?BP: (!) 158/82 (!) 168/68  (!) 160/78  ?Pulse: 80 87  83  ?Resp: 16 17 20 16   ?Temp: 98.7 ?F (37.1 ?C) 98.3 ?F (36.8 ?C)  98.4 ?F (36.9 ?C)  ?TempSrc:  Oral  Oral  ?SpO2: 100% 96%  99%  ?Height:      ? ? ?Intake/Output Summary (Last 24 hours) at 01/16/2022 1038 ?Last data filed at 01/15/2022 1859 ?Gross per 24 hour  ?Intake 753.29 ml  ?Output 600 ml  ?Net 153.29 ml  ? ?There were no vitals filed for this visit. ? ?Examination: ? ?Physical Exam: ?GEN:  NAD, alert and oriented x 3, morbidly obese ?HEENT: NCAT, PERRL, EOMI, sclera clear, MMM ?PULM: CTAB w/o wheezes/crackles, normal respiratory effort, on room air ?CV: RRR w/o M/G/R ?GI: abd soft, NTND, NABS, no R/G/M ?MSK: no peripheral edema, right lower extremity muscle strength 2/5, right upper extremity muscle strength 0/5 ?NEURO: Slight right-sided  facial droop noted, otherwise CN II-XII intact, decreased sensation in comparison right upper/lower extremity  ?PSYCH: normal mood/affect ?Integumentary: dry/intact, no rashes or wounds ? ? ? ?Data Reviewed:

## 2022-01-16 NOTE — Progress Notes (Signed)
Tele called because pt had 10 beats of PAT then 5 beats of V tach shortly after that.  Pt is asleep and asymptomatic.  Said she feels fine and has no chest pain, numbness, pressure or any complaints at this time.  No complaints.  I did page on call MD through [facility] to report this.   ?

## 2022-01-16 NOTE — H&P (Incomplete)
? ? ?Physical Medicine and Rehabilitation Admission H&P ? ?  ?Chief Complaint  ?Patient presents with  ? Stroke Symptoms  ?: ?HPI: Samantha [name] is a 66 year old right-handed female with history of obesity BMI greater than 56, hypertension hyperlipidemia diabetes mellitus type 2 as well as remote right cerebellar infarction identified on CT imaging November 2020.  Per chart review lives with spouse.  1 level home 3 steps to entry.  Independent with driving and ADLs and did not require assistive device.  Presented 01/13/2022 with right side weakness of acute onset.  Cranial CT scan showed new white matter changes on the left primarily involving the corona radiata abutting the body of the left caudate.  There was also a lacunar infarct in the left thalamus not identified on previous study.  CT angiogram head and neck occlusion versus severe stenosis of proximal right M2 MCA branch with distal reconstitution.  Severe proximal left M2 MCA stenosis.  MRI of the brain 01/14/2022 confluent acute white matter infarct of the left corona radiata.  No associated hemorrhage or mass effect.  Superimposed punctate acute on chronic right cerebellar SCA territory ischemia.  Admission chemistry sodium 134 glucose 494 BUN 25 creatinine 1.26, troponin 25, hemoglobin A1c 13.1.  Echocardiogram with ejection fraction of 55 to 60% no wall motion abnormalities.  Currently maintained on aspirin 325 mg daily and Plavix 75 mg daily for CVA prophylaxis x3 months then aspirin alone given intracranial severe stenosis.  Lovenox for DVT prophylaxis.  Tolerating a regular diet.  Therapy evaluations completed due to patient's right side weakness was admitted for a comprehensive rehab program. ? ?Review of Systems  ?Constitutional:  Negative for chills and fever.  ?HENT:  Negative for hearing loss.   ?Eyes:  Negative for blurred vision and double vision.  ?Respiratory:  Negative for cough and shortness of breath.   ?Cardiovascular:  Positive for leg  swelling. Negative for chest pain and palpitations.  ?Gastrointestinal:  Positive for constipation. Negative for heartburn, nausea and vomiting.  ?Genitourinary:  Negative for dysuria, flank pain and hematuria.  ?Musculoskeletal:  Positive for joint pain and myalgias.  ?Skin:  Negative for rash.  ?Neurological:  Positive for weakness.  ?All other systems reviewed and are negative. ?History reviewed. No pertinent past medical history. ?Past Surgical History:  ?Procedure Laterality Date  ? TUBAL LIGATION    ? 23 yrs ago  ? ?Family History  ?Problem Relation Age of Onset  ? Hypertension Mother   ? ?Social History:  reports that she has never smoked. She has never used smokeless tobacco. She reports that she does not drink alcohol and does not use drugs. ?Allergies:  ?Allergies  ?Allergen Reactions  ? Penicillins Rash  ? ?Medications Prior to Admission  ?Medication Sig Dispense Refill  ? amLODipine (NORVASC) 10 MG tablet Take 1 tablet (10 mg total) by mouth daily. 90 tablet 3  ? atorvastatin (LIPITOR) 10 MG tablet Take 1 tablet (10 mg total) by mouth daily. 90 tablet 3  ? glimepiride (AMARYL) 4 MG tablet TAKE 2 TABLETS BY MOUTH ONCE DAILY IN THE MORNING WITH BREAKFAST . 60 tablet 11  ? hydrochlorothiazide (HYDRODIURIL) 25 MG tablet Take 1 tablet (25 mg total) by mouth daily. 90 tablet 3  ? lisinopril (ZESTRIL) 40 MG tablet Take 1 tablet by mouth once daily 90 tablet 0  ? ? ? ? ?Home: ?Home Living ?Family/patient expects to be discharged to:: Private residence ?Living Arrangements: Spouse/significant other, Children ?Available Help at Discharge: Family, Available 24 hours/day ?Type  of Home: House ?Home Access: Stairs to enter ?Entrance Stairs-Number of Steps: 3 steps ?Entrance Stairs-Rails: Can reach both ?Home Layout: One level ?Bathroom Shower/Tub: Walk-in shower ?Bathroom Toilet: Standard ?Bathroom Accessibility: Yes ?Home Equipment: Rolling Walker (2 wheels), BSC/3in1 ?Additional Comments: 3 steps to enter home;  single level once inside ? Lives With: Spouse ?  ?Functional History: ?Prior Function ?Prior Level of Function : Independent/Modified Independent ?Mobility Comments: no AD ?ADLs Comments: Indep ? ?Functional Status:  ?Mobility: ?Bed Mobility ?Overal bed mobility: Needs Assistance ?Bed Mobility: Supine to Sit ?Supine to sit: Mod assist ?General bed mobility comments: Assist to bring RLE off of bed, elevate trunk into sitting and bring hips to EOB ?Transfers ?Overall transfer level: Needs assistance ?Equipment used: Ambulation equipment used ?Transfers: Sit to/from Stand, Bed to chair/wheelchair/BSC ?Sit to Stand: +2 physical assistance, Mod assist ?Bed to/from chair/wheelchair/BSC transfer type:: Via Lift equipment ?Stand pivot transfers: +2 physical assistance, Mod assist ?Transfer via Lift Equipment: Stedy ?General transfer comment: Stood from bed with Stedy with +2 mod assist to bring hips up and for balance. Assist to extend rt hip. Pt with majority of weight on lt side. Used Stedy to go to [employer]. Pt able to stand from seat of Stedy with +2 mod assist. ?Ambulation/Gait ?Pre-gait activities: Stood with [employer] and worked on facilitating rt hip, knee and trunk extension ?  ? ?ADL: ?ADL ?Overall ADL's : Needs assistance/impaired ?Eating/Feeding: Set up, Sitting ?Grooming: Minimal assistance, Sitting ?Upper Body Bathing: Moderate assistance, Sitting ?Lower Body Bathing: Maximal assistance, +2 for safety/equipment, +2 for physical assistance, Sitting/lateral leans, Sit to/from stand ?Upper Body Dressing : Moderate assistance, Sitting ?Lower Body Dressing: Maximal assistance, +2 for safety/equipment, Sitting/lateral leans, Sit to/from stand ?Toilet Transfer: Maximal assistance, +2 for physical assistance, +2 for safety/equipment, Stand-pivot ?Toileting- Clothing Manipulation and Hygiene: Maximal assistance, +2 for physical assistance, +2 for safety/equipment, Sitting/lateral lean, Sit to/from stand ?General ADL  Comments: Increased assist due to LUE weakness and BLE weakness. ? ?Cognition: ?Cognition ?Overall Cognitive Status: Impaired/Different from baseline ?Arousal/Alertness: Awake/alert ?Orientation Level: Oriented X4 ?Year:  (2003) ?Month: March ?Day of Week: Incorrect (Saturday, March 20) ?Attention: Focused, Sustained ?Memory: Impaired ?Memory Impairment: Retrieval deficit, Decreased short term memory (Imediate: 5/5 with 4 repetitions; delayed: 1/5; with cues: 3/4) ?Awareness: Impaired ?Awareness Impairment: Emergent impairment ?Problem Solving: Impaired ?Problem Solving Impairment: Verbal complex (Money: 1/3) ?Executive Function: Sequencing, Organizing ?Organizing: Impaired ?Organizing Impairment: Verbal complex (Backward digit span: 1/2) ?Cognition ?Arousal/Alertness: Awake/alert ?Behavior During Therapy: [facility] for tasks assessed/performed ?Overall Cognitive Status: Impaired/Different from baseline ?Area of Impairment: Problem solving ?Safety/Judgement: Decreased awareness of safety, Decreased awareness of deficits ?Problem Solving: Slow processing, Requires verbal cues, Requires tactile cues ?General Comments: At times needing increased time to respond/follow commands ? ?Physical Exam: ?Blood pressure (!) 150/71, pulse 87, temperature 98.6 ?F (37 ?C), temperature source Oral, resp. rate 16, height 5' (1.524 m), weight 130.6 kg, SpO2 96 %. ?Physical Exam ?Neurological:  ?   Comments: Patient is alert.  No acute distress.  Makes eye contact with examiner.  Oriented to person and place.  Fair awareness of deficits.  ? ? ?Results for orders placed or performed during the hospital encounter of 01/13/22 (from the past 48 hour(s))  ?Glucose, capillary     Status: Abnormal  ? Collection Time: 01/15/22 12:48 PM  ?Result Value Ref Range  ? Glucose-Capillary 253 (H) 70 - 99 mg/dL  ?  Comment: Glucose reference range applies only to samples taken after fasting for at least 8 hours.  ? Comment  1 Notify RN   ? Comment 2 Document  in Chart   ?Glucose, capillary     Status: Abnormal  ? Collection Time: 01/15/22  2:48 PM  ?Result Value Ref Range  ? Glucose-Capillary 278 (H) 70 - 99 mg/dL  ?  Comment: Glucose reference range applies only to

## 2022-01-16 NOTE — Plan of Care (Signed)
  Problem: Education: Goal: Knowledge of General Education information will improve Description: Including pain rating scale, medication(s)/side effects and non-pharmacologic comfort measures Outcome: Progressing   Problem: Health Behavior/Discharge Planning: Goal: Ability to manage health-related needs will improve Outcome: Progressing   Problem: Activity: Goal: Risk for activity intolerance will decrease Outcome: Progressing   Problem: Pain Managment: Goal: General experience of comfort will improve Outcome: Progressing   Problem: Skin Integrity: Goal: Risk for impaired skin integrity will decrease Outcome: Progressing   

## 2022-01-17 ENCOUNTER — Other Ambulatory Visit: Payer: Self-pay

## 2022-01-17 ENCOUNTER — Inpatient Hospital Stay (HOSPITAL_COMMUNITY)
Admission: RE | Admit: 2022-01-17 | Discharge: 2022-02-13 | DRG: 057 | Disposition: A | Discharge status: Home Health | Payer: 59 | Source: Intra-hospital | Attending: Physical Medicine and Rehabilitation | Admitting: Physical Medicine and Rehabilitation

## 2022-01-17 ENCOUNTER — Encounter (HOSPITAL_COMMUNITY): Payer: Self-pay | Admitting: Physical Medicine and Rehabilitation

## 2022-01-17 DIAGNOSIS — G4700 Insomnia, unspecified: Secondary | ICD-10-CM | POA: Diagnosis present

## 2022-01-17 DIAGNOSIS — Z79899 Other long term (current) drug therapy: Secondary | ICD-10-CM

## 2022-01-17 DIAGNOSIS — E1169 Type 2 diabetes mellitus with other specified complication: Secondary | ICD-10-CM | POA: Diagnosis present

## 2022-01-17 DIAGNOSIS — I82452 Acute embolism and thrombosis of left peroneal vein: Secondary | ICD-10-CM | POA: Diagnosis present

## 2022-01-17 DIAGNOSIS — F419 Anxiety disorder, unspecified: Secondary | ICD-10-CM | POA: Diagnosis present

## 2022-01-17 DIAGNOSIS — E119 Type 2 diabetes mellitus without complications: Secondary | ICD-10-CM | POA: Diagnosis present

## 2022-01-17 DIAGNOSIS — B9620 Unspecified Escherichia coli [E. coli] as the cause of diseases classified elsewhere: Secondary | ICD-10-CM | POA: Diagnosis present

## 2022-01-17 DIAGNOSIS — I6300 Cerebral infarction due to thrombosis of unspecified precerebral artery: Secondary | ICD-10-CM | POA: Diagnosis not present

## 2022-01-17 DIAGNOSIS — I639 Cerebral infarction, unspecified: Secondary | ICD-10-CM | POA: Diagnosis not present

## 2022-01-17 DIAGNOSIS — K5900 Constipation, unspecified: Secondary | ICD-10-CM | POA: Diagnosis present

## 2022-01-17 DIAGNOSIS — E1165 Type 2 diabetes mellitus with hyperglycemia: Secondary | ICD-10-CM | POA: Diagnosis not present

## 2022-01-17 DIAGNOSIS — R000 Tachycardia, unspecified: Secondary | ICD-10-CM

## 2022-01-17 DIAGNOSIS — Z794 Long term (current) use of insulin: Secondary | ICD-10-CM

## 2022-01-17 DIAGNOSIS — K5909 Other constipation: Secondary | ICD-10-CM | POA: Diagnosis present

## 2022-01-17 DIAGNOSIS — I10 Essential (primary) hypertension: Secondary | ICD-10-CM | POA: Diagnosis present

## 2022-01-17 DIAGNOSIS — N179 Acute kidney failure, unspecified: Secondary | ICD-10-CM | POA: Diagnosis present

## 2022-01-17 DIAGNOSIS — I82442 Acute embolism and thrombosis of left tibial vein: Secondary | ICD-10-CM | POA: Diagnosis present

## 2022-01-17 DIAGNOSIS — I69398 Other sequelae of cerebral infarction: Principal | ICD-10-CM

## 2022-01-17 DIAGNOSIS — Z6841 Body Mass Index (BMI) 40.0 and over, adult: Secondary | ICD-10-CM

## 2022-01-17 DIAGNOSIS — E559 Vitamin D deficiency, unspecified: Secondary | ICD-10-CM | POA: Diagnosis present

## 2022-01-17 DIAGNOSIS — E785 Hyperlipidemia, unspecified: Secondary | ICD-10-CM | POA: Diagnosis present

## 2022-01-17 DIAGNOSIS — R21 Rash and other nonspecific skin eruption: Secondary | ICD-10-CM | POA: Diagnosis not present

## 2022-01-17 DIAGNOSIS — Z8249 Family history of ischemic heart disease and other diseases of the circulatory system: Secondary | ICD-10-CM | POA: Diagnosis not present

## 2022-01-17 DIAGNOSIS — R0989 Other specified symptoms and signs involving the circulatory and respiratory systems: Secondary | ICD-10-CM

## 2022-01-17 DIAGNOSIS — I63513 Cerebral infarction due to unspecified occlusion or stenosis of bilateral middle cerebral arteries: Secondary | ICD-10-CM | POA: Diagnosis not present

## 2022-01-17 DIAGNOSIS — N390 Urinary tract infection, site not specified: Secondary | ICD-10-CM | POA: Diagnosis present

## 2022-01-17 DIAGNOSIS — E669 Obesity, unspecified: Secondary | ICD-10-CM | POA: Diagnosis present

## 2022-01-17 DIAGNOSIS — F32A Depression, unspecified: Secondary | ICD-10-CM | POA: Diagnosis present

## 2022-01-17 DIAGNOSIS — R531 Weakness: Secondary | ICD-10-CM | POA: Diagnosis present

## 2022-01-17 DIAGNOSIS — R609 Edema, unspecified: Secondary | ICD-10-CM | POA: Diagnosis not present

## 2022-01-17 LAB — CBC
HCT: 36.9 % (ref 36.0–46.0)
Hemoglobin: 11.3 g/dL — ABNORMAL LOW (ref 12.0–15.0)
MCH: 31 pg (ref 26.0–34.0)
MCHC: 30.6 g/dL (ref 30.0–36.0)
MCV: 101.4 fL — ABNORMAL HIGH (ref 80.0–100.0)
Platelets: 155 10*3/uL (ref 150–400)
RBC: 3.64 MIL/uL — ABNORMAL LOW (ref 3.87–5.11)
RDW: 12 % (ref 11.5–15.5)
WBC: 8.8 10*3/uL (ref 4.0–10.5)
nRBC: 0 % (ref 0.0–0.2)

## 2022-01-17 LAB — GLUCOSE, CAPILLARY
Glucose-Capillary: 201 mg/dL — ABNORMAL HIGH (ref 70–99)
Glucose-Capillary: 196 mg/dL — ABNORMAL HIGH (ref 70–99)
Glucose-Capillary: 96 mg/dL (ref 70–99)
Glucose-Capillary: 133 mg/dL — ABNORMAL HIGH (ref 70–99)

## 2022-01-17 LAB — BASIC METABOLIC PANEL
Anion gap: 7 (ref 5–15)
BUN: 27 mg/dL — ABNORMAL HIGH (ref 8–23)
CO2: 25 mmol/L (ref 22–32)
Calcium: 8.9 mg/dL (ref 8.9–10.3)
Chloride: 105 mmol/L (ref 98–111)
Creatinine, Ser: 1.06 mg/dL — ABNORMAL HIGH (ref 0.44–1.00)
GFR, Estimated: 59 mL/min — ABNORMAL LOW (ref >60)
Glucose, Bld: 174 mg/dL — ABNORMAL HIGH (ref 70–99)
Potassium: 4.4 mmol/L (ref 3.5–5.1)
Sodium: 137 mmol/L (ref 135–145)

## 2022-01-17 MED ORDER — MAGNESIUM OXIDE -MG SUPPLEMENT 400 (240 MG) MG PO TABS
400.0000 mg | ORAL_TABLET | Freq: Every day | ORAL | Status: DC
  Administered 2022-01-17 – 2022-01-22 (×6): 400 mg via ORAL
  Filled 2022-01-17 (×6): qty 1

## 2022-01-17 MED ORDER — ATORVASTATIN CALCIUM 80 MG PO TABS: 80.0000 mg | ORAL_TABLET | Freq: Every day | ORAL | 3 refills | Status: DC

## 2022-01-17 MED ORDER — INSULIN ASPART 100 UNIT/ML IJ SOLN
6.0000 [IU] | Freq: Three times a day (TID) | INTRAMUSCULAR | 0 refills | Status: DC
Start: 2022-01-17 — End: 2022-02-13

## 2022-01-17 MED ORDER — ENOXAPARIN SODIUM 60 MG/0.6ML IJ SOSY
60.0000 mg | PREFILLED_SYRINGE | INTRAMUSCULAR | Status: DC
  Administered 2022-01-17 – 2022-01-21 (×5): 60 mg via SUBCUTANEOUS
  Filled 2022-01-17 (×4): qty 0.6

## 2022-01-17 MED ORDER — CLOPIDOGREL BISULFATE 75 MG PO TABS: 75.0000 mg | ORAL_TABLET | Freq: Every day | ORAL | 0 refills | Status: DC

## 2022-01-17 MED ORDER — ENOXAPARIN SODIUM 80 MG/0.8ML IJ SOSY
0.5000 mg/kg | PREFILLED_SYRINGE | INTRAMUSCULAR | Status: DC
  Filled 2022-01-17 (×2): qty 0.63

## 2022-01-17 MED ORDER — ENOXAPARIN SODIUM 40 MG/0.4ML IJ SOSY: 40.0000 mg | PREFILLED_SYRINGE | INTRAMUSCULAR | Status: DC

## 2022-01-17 MED ORDER — AMLODIPINE BESYLATE 10 MG PO TABS
10.0000 mg | ORAL_TABLET | Freq: Every day | ORAL | Status: DC
  Administered 2022-01-18 – 2022-02-13 (×27): 10 mg via ORAL
  Filled 2022-01-17 (×27): qty 1

## 2022-01-17 MED ORDER — INSULIN GLARGINE-YFGN 100 UNIT/ML ~~LOC~~ SOLN
45.0000 [IU] | Freq: Every day | SUBCUTANEOUS | Status: DC
  Administered 2022-01-18 – 2022-01-24 (×7): 45 [IU] via SUBCUTANEOUS
  Filled 2022-01-17 (×7): qty 0.45

## 2022-01-17 MED ORDER — MELATONIN 3 MG PO TABS
3.0000 mg | ORAL_TABLET | Freq: Every day | ORAL | Status: DC
  Administered 2022-01-17 – 2022-01-18 (×2): 3 mg via ORAL
  Filled 2022-01-17 (×2): qty 1

## 2022-01-17 MED ORDER — ACETAMINOPHEN 650 MG RE SUPP: 650.0000 mg | RECTAL | Status: DC | PRN

## 2022-01-17 MED ORDER — ASPIRIN 325 MG PO TBEC: 325.0000 mg | DELAYED_RELEASE_TABLET | Freq: Every day | ORAL | 1 refills | Status: DC

## 2022-01-17 MED ORDER — MAGNESIUM GLUCONATE 500 MG PO TABS: 250.0000 mg | ORAL_TABLET | Freq: Every day | ORAL | Status: DC

## 2022-01-17 MED ORDER — PNEUMOCOCCAL 20-VAL CONJ VACC 0.5 ML IM SUSY
0.5000 mL | PREFILLED_SYRINGE | INTRAMUSCULAR | Status: DC
  Filled 2022-01-17: qty 0.5

## 2022-01-17 MED ORDER — LIVING WELL WITH DIABETES BOOK
Freq: Once | Status: AC
  Filled 2022-01-17: qty 1

## 2022-01-17 MED ORDER — BLOOD PRESSURE CONTROL BOOK
Freq: Once | Status: AC
  Filled 2022-01-17: qty 1

## 2022-01-17 MED ORDER — INSULIN ASPART 100 UNIT/ML IJ SOLN
0.0000 [IU] | Freq: Three times a day (TID) | INTRAMUSCULAR | Status: DC
  Administered 2022-01-19: 3 [IU] via SUBCUTANEOUS
  Administered 2022-01-20 (×2): 5 [IU] via SUBCUTANEOUS
  Administered 2022-01-21: 2 [IU] via SUBCUTANEOUS
  Administered 2022-01-22 – 2022-01-23 (×3): 3 [IU] via SUBCUTANEOUS
  Administered 2022-01-23: 5 [IU] via SUBCUTANEOUS
  Administered 2022-01-24: 3 [IU] via SUBCUTANEOUS
  Administered 2022-01-24: 1 [IU] via SUBCUTANEOUS
  Administered 2022-01-25: 5 [IU] via SUBCUTANEOUS
  Administered 2022-01-25 – 2022-01-27 (×2): 2 [IU] via SUBCUTANEOUS
  Administered 2022-01-28 (×2): 3 [IU] via SUBCUTANEOUS
  Administered 2022-01-28: 2 [IU] via SUBCUTANEOUS
  Administered 2022-01-29: 3 [IU] via SUBCUTANEOUS
  Administered 2022-01-29 – 2022-01-30 (×3): 2 [IU] via SUBCUTANEOUS
  Administered 2022-01-30: 3 [IU] via SUBCUTANEOUS
  Administered 2022-01-30 – 2022-01-31 (×3): 2 [IU] via SUBCUTANEOUS
  Administered 2022-02-01 – 2022-02-04 (×6): 3 [IU] via SUBCUTANEOUS
  Administered 2022-02-04: 2 [IU] via SUBCUTANEOUS
  Administered 2022-02-05: 3 [IU] via SUBCUTANEOUS
  Administered 2022-02-05 (×2): 2 [IU] via SUBCUTANEOUS
  Administered 2022-02-06: 5 [IU] via SUBCUTANEOUS
  Administered 2022-02-06: 3 [IU] via SUBCUTANEOUS
  Administered 2022-02-07 – 2022-02-08 (×4): 2 [IU] via SUBCUTANEOUS
  Administered 2022-02-08: 3 [IU] via SUBCUTANEOUS
  Administered 2022-02-09 (×2): 2 [IU] via SUBCUTANEOUS
  Administered 2022-02-10: 3 [IU] via SUBCUTANEOUS
  Administered 2022-02-10 – 2022-02-13 (×4): 2 [IU] via SUBCUTANEOUS

## 2022-01-17 MED ORDER — INSULIN GLARGINE-YFGN 100 UNIT/ML ~~LOC~~ SOLN: 45.0000 [IU] | Freq: Every day | SUBCUTANEOUS | 0 refills | Status: DC

## 2022-01-17 MED ORDER — INSULIN GLARGINE-YFGN 100 UNIT/ML ~~LOC~~ SOLN
45.0000 [IU] | Freq: Every day | SUBCUTANEOUS | Status: DC
  Filled 2022-01-17 (×2): qty 0.45

## 2022-01-17 MED ORDER — ACETAMINOPHEN 325 MG PO TABS
650.0000 mg | ORAL_TABLET | ORAL | Status: DC | PRN
  Administered 2022-01-26 – 2022-02-09 (×7): 650 mg via ORAL
  Filled 2022-01-17 (×7): qty 2

## 2022-01-17 MED ORDER — ASPIRIN EC 325 MG PO TBEC
325.0000 mg | DELAYED_RELEASE_TABLET | Freq: Every day | ORAL | Status: DC
  Administered 2022-01-18 – 2022-02-09 (×23): 325 mg via ORAL
  Filled 2022-01-17 (×24): qty 1

## 2022-01-17 MED ORDER — ATORVASTATIN CALCIUM 80 MG PO TABS
80.0000 mg | ORAL_TABLET | Freq: Every day | ORAL | Status: DC
  Administered 2022-01-18 – 2022-02-12 (×26): 80 mg via ORAL
  Filled 2022-01-17 (×26): qty 1

## 2022-01-17 MED ORDER — ACETAMINOPHEN 160 MG/5ML PO SOLN
650.0000 mg | ORAL | Status: DC | PRN
Start: 2022-01-17 — End: 2022-02-13

## 2022-01-17 MED ORDER — EXERCISE FOR HEART AND HEALTH BOOK
Freq: Once | Status: AC
Start: 2022-01-17 — End: 2022-01-17
  Filled 2022-01-17: qty 1

## 2022-01-17 MED ORDER — INSULIN ASPART 100 UNIT/ML IJ SOLN
6.0000 [IU] | Freq: Three times a day (TID) | INTRAMUSCULAR | Status: DC
  Administered 2022-01-17 – 2022-01-23 (×12): 6 [IU] via SUBCUTANEOUS

## 2022-01-17 MED ORDER — CLOPIDOGREL BISULFATE 75 MG PO TABS
75.0000 mg | ORAL_TABLET | Freq: Every day | ORAL | Status: DC
  Administered 2022-01-18 – 2022-02-11 (×25): 75 mg via ORAL
  Filled 2022-01-17 (×26): qty 1

## 2022-01-17 NOTE — Plan of Care (Signed)
  Problem: Education: Goal: Knowledge of General Education information will improve Description: Including pain rating scale, medication(s)/side effects and non-pharmacologic comfort measures Outcome: Progressing   Problem: Health Behavior/Discharge Planning: Goal: Ability to manage health-related needs will improve Outcome: Progressing   Problem: Clinical Measurements: Goal: Ability to maintain clinical measurements within normal limits will improve Outcome: Progressing Goal: Will remain free from infection Outcome: Progressing Goal: Diagnostic test results will improve Outcome: Progressing Goal: Respiratory complications will improve Outcome: Progressing Goal: Cardiovascular complication will be avoided Outcome: Progressing   Problem: Activity: Goal: Risk for activity intolerance will decrease Outcome: Progressing   Problem: Nutrition: Goal: Adequate nutrition will be maintained Outcome: Progressing   Problem: Coping: Goal: Level of anxiety will decrease Outcome: Progressing   Problem: Elimination: Goal: Will not experience complications related to bowel motility Outcome: Progressing Goal: Will not experience complications related to urinary retention Outcome: Progressing   Problem: Pain Managment: Goal: General experience of comfort will improve Outcome: Progressing   Problem: Safety: Goal: Ability to remain free from injury will improve Outcome: Progressing   Problem: Skin Integrity: Goal: Risk for impaired skin integrity will decrease Outcome: Progressing   Problem: Education: Goal: Knowledge of disease or condition will improve Outcome: Progressing Goal: Knowledge of secondary prevention will improve (SELECT ALL) Outcome: Progressing Goal: Knowledge of patient specific risk factors will improve (INDIVIDUALIZE FOR PATIENT) Outcome: Progressing Goal: Individualized Educational Video(s) Outcome: Progressing   Problem: Coping: Goal: Will verbalize  positive feelings about self Outcome: Progressing Goal: Will identify appropriate support needs Outcome: Progressing   Problem: Health Behavior/Discharge Planning: Goal: Ability to manage health-related needs will improve Outcome: Progressing   Problem: Self-Care: Goal: Ability to participate in self-care as condition permits will improve Outcome: Progressing Goal: Verbalization of feelings and concerns over difficulty with self-care will improve Outcome: Progressing Goal: Ability to communicate needs accurately will improve Outcome: Progressing   Problem: Nutrition: Goal: Risk of aspiration will decrease Outcome: Progressing Goal: Dietary intake will improve Outcome: Progressing   Problem: Ischemic Stroke/TIA Tissue Perfusion: Goal: Complications of ischemic stroke/TIA will be minimized Outcome: Progressing   

## 2022-01-17 NOTE — H&P (Signed)
? ? ?Physical Medicine and Rehabilitation Admission H&P ? ?CC: CVA ? 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  Per chart review lives with spouse.  1 level home 3 steps to entry.  Independent with driving and ADLs and did not require assistive device.  Presented 01/13/2022 with right side weakness of acute onset.  Cranial CT scan showed new white matter changes on the left primarily involving the corona radiata abutting the body of the left caudate.  There was also a lacunar infarct in the left thalamus not identified on previous study.  CT angiogram head and neck occlusion versus severe stenosis of proximal right M2 MCA branch with distal reconstitution.  Severe proximal left M2 MCA stenosis.  MRI of the brain 01/14/2022 confluent acute white matter infarct of the left corona radiata.  No associated hemorrhage or mass effect.  Superimposed punctate acute on chronic right cerebellar SCA territory ischemia.  Admission chemistry sodium 134 glucose 494 BUN 25 creatinine 1.26, troponin 25, hemoglobin A1c 13.1.  Echocardiogram with ejection fraction of 55 to 60% no wall motion abnormalities.  Currently maintained on aspirin 325 mg daily and Plavix 75 mg daily for CVA prophylaxis x3 months then aspirin alone given intracranial severe stenosis.  Lovenox for DVT prophylaxis.  Tolerating a regular diet.  Therapy evaluations completed due to patient's right side weakness was admitted for a comprehensive rehab program. Currently complains of weakness.  ? ?Review of Systems  ?Constitutional:  Negative for chills and fever.  ?HENT:  Negative for hearing loss.   ?Eyes:  Negative for blurred vision and double vision.  ?Respiratory:  Negative for cough and shortness of breath.   ?Cardiovascular:  Positive for leg swelling. Negative for  chest pain and palpitations.  ?Gastrointestinal:  Positive for constipation. Negative for heartburn, nausea and vomiting.  ?Genitourinary:  Negative for dysuria, flank pain and hematuria.  ?Musculoskeletal:  Positive for joint pain and myalgias.  ?Skin:  Negative for rash.  ?Neurological:  Positive for weakness.  ?All other systems reviewed and are negative. ?History reviewed. No pertinent past medical history. ?Past Surgical History:  ?Procedure Laterality Date  ? TUBAL LIGATION    ? 23 yrs ago  ? ?Family History  ?Problem Relation Age of Onset  ? Hypertension Mother   ? ?Social History:  reports that she has never smoked. She has never used smokeless tobacco. She reports that she does not drink alcohol and does not use drugs. ?Allergies:  ?Allergies  ?Allergen Reactions  ? Penicillins Rash  ? ?Medications Prior to Admission  ?Medication Sig Dispense Refill  ? amLODipine (NORVASC) 10 MG tablet Take 1 tablet (10 mg total) by mouth daily. 90 tablet 3  ? [START ON 01/18/2022] aspirin EC 325 MG EC tablet Take 1 tablet (325 mg total) by mouth daily. 90 tablet 1  ? atorvastatin (LIPITOR) 80 MG tablet Take 1 tablet (80 mg total) by mouth daily at 6 PM. 30 tablet 3  ? [START ON 01/18/2022] clopidogrel (PLAVIX) 75 MG tablet Take 1 tablet (75 mg total) by mouth daily. Take with aspirin for 90 days (3 months), then stop plavix and take aspirin alone 90 tablet 0  ? insulin aspart (NOVOLOG) 100 UNIT/ML injection Inject 6 Units into the skin 3 (three) times daily with meals. 10 mL 0  ? [START ON 01/18/2022] insulin glargine-yfgn (SEMGLEE) 100  UNIT/ML injection Inject 0.45 mLs (45 Units total) into the skin daily. 10 mL 0  ? ? ?Home: ?Home Living ?Family/patient expects to be discharged to:: Private residence ?Living Arrangements: Spouse/significant other, Children ?Available Help at Discharge: Family, Available 24 hours/day ?Type of Home: House ?Home Access: Stairs to enter ?Entrance Stairs-Number of Steps: 3 steps ?Entrance  Stairs-Rails: Can reach both ?Home Layout: One level ?Bathroom Shower/Tub: Walk-in shower ?Bathroom Toilet: Standard ?Bathroom Accessibility: Yes ?Home Equipment: Rolling Walker (2 wheels), BSC/3in1 ?Additional Comments: 3 steps to enter home; single level once inside ? Lives With: Spouse ?  ?Functional History: ?Prior Function ?Prior Level of Function : Independent/Modified Independent ?Mobility Comments: no AD ?ADLs Comments: Indep ?  ?Functional Status:  ?Mobility: ?Bed Mobility ?Overal bed mobility: Needs Assistance ?Bed Mobility: Supine to Sit ?Supine to sit: Mod assist ?General bed mobility comments: Assist to bring RLE off of bed, elevate trunk into sitting and bring hips to EOB ?Transfers ?Overall transfer level: Needs assistance ?Equipment used: Ambulation equipment used ?Transfers: Sit to/from Stand, Bed to chair/wheelchair/BSC ?Sit to Stand: +2 physical assistance, Mod assist ?Bed to/from chair/wheelchair/BSC transfer type:: Via Lift equipment ?Stand pivot transfers: +2 physical assistance, Mod assist ?Transfer via Lift Equipment: Stedy ?General transfer comment: Stood from bed with Stedy with +2 mod assist to bring hips up and for balance. Assist to extend rt hip. Pt with majority of weight on lt side. Used Stedy to go to [employer] INCrecliner. Pt able to stand from seat of Stedy with +2 mod assist. ?Ambulation/Gait ?Pre-gait activities: Stood with WellPointStedy and worked on facilitating rt hip, knee and trunk extension ?  ?ADL: ?ADL ?Overall ADL's : Needs assistance/impaired ?Eating/Feeding: Set up, Sitting ?Grooming: Minimal assistance, Sitting ?Upper Body Bathing: Moderate assistance, Sitting ?Lower Body Bathing: Maximal assistance, +2 for safety/equipment, +2 for physical assistance, Sitting/lateral leans, Sit to/from stand ?Upper Body Dressing : Moderate assistance, Sitting ?Lower Body Dressing: Maximal assistance, +2 for safety/equipment, Sitting/lateral leans, Sit to/from stand ?Toilet Transfer: Maximal assistance,  +2 for physical assistance, +2 for safety/equipment, Stand-pivot ?Toileting- Clothing Manipulation and Hygiene: Maximal assistance, +2 for physical assistance, +2 for safety/equipment, Sitting/lateral lean, Sit to/from stand ?General ADL Comments: Increased assist due to LUE weakness and BLE weakness. ?  ?Cognition: ?Cognition ?Overall Cognitive Status: Impaired/Different from baseline ?Arousal/Alertness: Awake/alert ?Orientation Level: Oriented X4 ?Year:  (2003) ?Month: March ?Day of Week: Incorrect (Saturday, March 20) ?Attention: Focused, Sustained ?Memory: Impaired ?Memory Impairment: Retrieval deficit, Decreased short term memory (Imediate: 5/5 with 4 repetitions; delayed: 1/5; with cues: 3/4) ?Awareness: Impaired ?Awareness Impairment: Emergent impairment ?Problem Solving: Impaired ?Problem Solving Impairment: Verbal complex (Money: 1/3) ?Executive Function: Sequencing, Organizing ?Organizing: Impaired ?Organizing Impairment: Verbal complex (Backward digit span: 1/2) ?Cognition ?Arousal/Alertness: Awake/alert ?Behavior During Therapy: [facility] for tasks assessed/performed ?Overall Cognitive Status: Impaired/Different from baseline ?Area of Impairment: Problem solving ?Safety/Judgement: Decreased awareness of safety, Decreased awareness of deficits ?Problem Solving: Slow processing, Requires verbal cues, Requires tactile cues ?General Comments: At times needing increased time to respond/follow commands ?  ? ?Physical Exam: ?Blood pressure (!) 161/66, pulse 84, temperature 98.2 ?F (36.8 ?C), resp. rate 16, height 5' (1.524 m), weight 126.3 kg, SpO2 97 %. ?Gen: no distress, normal appearing ?HEENT: oral mucosa pink and moist, NCAT ?Cardio: Reg rate ?Chest: normal effort, normal rate of breathing ?Abd: soft, non-distended ?Ext: no edema ?Psych: pleasant, normal affect ?Skin: intact ?Neurological:  ?   Comments: Patient is alert.  No acute distress.  Makes eye contact with examiner.  Oriented to person and place.  Fair  awareness of deficits. RUE 0/5, RLE 2/5, Left side 5/5. Sensation intact. ? ?Results for orders placed or performed during the hospital encounter of 01/17/22 (from the past 48 hour(s))  ?Glucose, capillary

## 2022-01-17 NOTE — Telephone Encounter (Signed)
lmtcb

## 2022-01-17 NOTE — Progress Notes (Signed)
Physical Therapy Treatment ?Patient Details ?Name: Samantha [name] ?MRN: [id number] ?DOB: October 12, 1956 ?Today's Date: 01/17/2022 ? ? ?History of Present Illness 66 y.o. female  who presents 01/13/22 with a 5-day history of right upper extremity weakness. CT head infarct Lt thalamusand changes in left corona radiata and caudate; 3/19 a.m. increased Rt sided symptoms  PMH significant of hypertension, hyperlipidemia, diabetes mellitus type II; prior rt cerebellar infarct ? ?  ?PT Comments  ? ? Pt making slower progress toward goals.  Emphasis on warm up exercise, transition to EOB, scooting to EOB, sitting balance between standing trials, sit to stands in the RW and face to face transfer to the recliner. ?   ?Recommendations for follow up therapy are one component of a multi-disciplinary discharge planning process, led by the attending physician.  Recommendations may be updated based on patient status, additional functional criteria and insurance authorization. ? ?Follow Up Recommendations ? Acute inpatient rehab (3hours/day) ?  ?  ?Assistance Recommended at Discharge Frequent or constant Supervision/Assistance  ?Patient can return home with the following Two people to help with walking and/or transfers;Help with stairs or ramp for entrance;A lot of help with bathing/dressing/bathroom;Assistance with cooking/housework;Assist for transportation ?  ?Equipment Recommendations ? Wheelchair (measurements PT);Wheelchair cushion (measurements PT);Hospital bed;Other (comment)  ?  ?Recommendations for Other Services   ? ? ?  ?Precautions / Restrictions Precautions ?Precautions: Fall  ?  ? ?Mobility ? Bed Mobility ?Overal bed mobility: Needs Assistance ?Bed Mobility: Rolling, Sidelying to Sit ?Rolling: Mod assist, Max assist ?Sidelying to sit: Mod assist ?  ?  ?  ?General bed mobility comments: difficulty bring R side/shoulder R to L without significant assist including help pivoting with the padding and truncal assist up from L  elbow. ?  ? ?Transfers ?Overall transfer level: Needs assistance ?Equipment used: Ambulation equipment used, Rolling walker (2 wheels) ?Transfers: Sit to/from Stand, Bed to chair/wheelchair/BSC ?Sit to Stand: +2 physical assistance, Mod assist ?Stand pivot transfers: Mod assist, Max assist, +2 physical assistance ?  ?  ?  ?  ?General transfer comment: stood in the RW x3, but tranferred L to the recliner with face to face assist, R knee blocked and w/shift assist to take pivotal steps  to the recliner. ?  ? ?Ambulation/Gait ?  ?  ?  ?  ?  ?  ?  ?General Gait Details: not yet ready ? ? ?Stairs ?  ?  ?  ?  ?  ? ? ?Wheelchair Mobility ?  ? ?Modified Rankin (Stroke Patients Only) ?Modified Rankin (Stroke Patients Only) ?Modified Rankin: Severe disability ? ? ?  ?Balance Overall balance assessment: Needs assistance ?Sitting-balance support: Feet supported, Single extremity supported, No upper extremity supported ?Sitting balance-Leahy Scale: Fair ?  ?  ?Standing balance support: Single extremity supported, Bilateral upper extremity supported, During functional activity ?Standing balance-Leahy Scale: Poor ?Standing balance comment: In RW x3, knee blocked or guarded ?  ?  ?  ?  ?  ?  ?  ?  ?  ?  ?  ?  ? ?  ?Cognition Arousal/Alertness: Awake/alert ?Behavior During Therapy: [facility] for tasks assessed/performed ?Overall Cognitive Status: Impaired/Different from baseline ?Area of Impairment: Problem solving ?  ?  ?  ?  ?  ?  ?  ?  ?  ?  ?  ?  ?  ?  ?Problem Solving: Slow processing, Difficulty sequencing ?  ?  ?  ? ?  ?Exercises   ? ?  ?General Comments   ?  ?  ? ?  Pertinent Vitals/Pain Pain Assessment ?Pain Assessment: No/denies pain  ? ? ?Home Living   ?  ?  ?  ?  ?  ?  ?  ?  ?  ?   ?  ?Prior Function    ?  ?  ?   ? ?PT Goals (current goals can now be found in the care plan section) Acute Rehab PT Goals ?Patient Stated Goal: Get stronger and back to baseline. Pt mentioned how she enjoyed spending time with family. ?PT Goal  Formulation: With patient ?Time For Goal Achievement: 01/28/22 ?Potential to Achieve Goals: Good ?Progress towards PT goals: Progressing toward goals ? ?  ?Frequency ? ? ? Min 4X/week ? ? ? ?  ?PT Plan Current plan remains appropriate  ? ? ?Co-evaluation   ?  ?  ?  ?  ? ?  ?AM-PAC PT "6 Clicks" Mobility   ?Outcome Measure ? Help needed turning from your back to your side while in a flat bed without using bedrails?: A Lot ?Help needed moving from lying on your back to sitting on the side of a flat bed without using bedrails?: A Lot ?Help needed moving to and from a bed to a chair (including a wheelchair)?: Total ?Help needed standing up from a chair using your arms (e.g., wheelchair or bedside chair)?: Total ?Help needed to walk in hospital room?: Total ?Help needed climbing 3-5 steps with a railing? : Total ?6 Click Score: 8 ? ?  ?End of Session Equipment Utilized During Treatment: Gait belt ?Activity Tolerance: Patient tolerated treatment well ?Patient left: in chair;with call bell/phone within reach;with chair alarm set ?Nurse Communication: Mobility status;Need for lift equipment;Other (comment) (or STEDY) ?PT Visit Diagnosis: Other abnormalities of gait and mobility (R26.89);Muscle weakness (generalized) (M62.81);Other symptoms and signs involving the nervous system (R29.898) ?  ? ? ?Time: 1410-1445 ?PT Time Calculation (min) (ACUTE ONLY): 35 min ? ?Charges:  $Therapeutic Activity: 8-22 mins ?$Neuromuscular Re-education: 8-22 mins          ?          ? ?01/17/2022 ? ?Jacinto Halim., PT ?Acute Rehabilitation Services ?[phone]  (pager) ?[phone]  (office) ? ? ?Eliseo Gum Charlee Whitebread ?01/17/2022, 5:28 PM ? ?

## 2022-01-17 NOTE — Progress Notes (Signed)
PHARMACIST - PHYSICIAN COMMUNICATION ? ?CONCERNING:  Enoxaparin (Lovenox) for DVT Prophylaxis  ? ? ?RECOMMENDATION: ?Patient was prescribed enoxaprin 40mg  q24 hours for VTE prophylaxis.  ? ?Filed Weights  ? 01/17/22 1729  ?Weight: 126.3 kg (278 lb 7.1 oz)  ? ? ?Body mass index is 54.38 kg/m?. ? ?Estimated Creatinine Clearance: 65.9 mL/min (A) (by C-G formula based on SCr of 1.06 mg/dL (H)). ? ? ?Based on [place] patient is candidate for enoxaparin 0.5mg /kg TBW SQ every 24 hours based on BMI being >30. ? ?DESCRIPTION: ?Pharmacy has adjusted enoxaparin dose per [facility] policy. ? ?Patient is now receiving enoxaparin 62.5 mg every 24 hours  ? ? ?Berta Minor, PharmD ?Clinical Pharmacist  ?01/17/2022 ?8:35 PM ? ?

## 2022-01-17 NOTE — Progress Notes (Signed)
Samantha Ribas, MD  ?Physician ?Physical Medicine and Rehabilitation ?PMR Pre-admission     ?Signed ?Date of Service:  01/15/2022  4:55 PM ? Related encounter: ED to Hosp-Admission (Current) from 01/13/2022 in [place] ?  ?Signed    ?  ?Show:Clear all ?[x] Written[x] Templated[x] Copied ? ?Added by: ?[x] Vola Beneke, Vertis Kelch, RN[x] Ranell Patrick Clide Deutscher, MD ? ?[] Hover for details ?   ?   ?   ?   ?   ?   ?   ?   ?   ?   ?   ?   ?   ?   ?   ?   ?   ?   ?   ?   ?   ?   ?   ?   ?   ?   ?   ?   ?   ?   ?   ?   ?   ?   ?   ?   ?   ?   ?   ?   ?   ?   ?   ?   ?   ?   ?   ?   ?   ?   ?   ?   ?   ?   ?   ?   ?   ?   ?   ?   ?   ?   ?   ?   ?   ?   ?   ?   ?   ?   ?   ?   ?   ?   ?   ?   ?   ?   ?   ?   ?   ?   ?   ?   ?   ?   ?   ?   ?   ?   ?   ?   ?   ?   ?   ?   ?   ?   ?   ?   ?   ?   ?   ?   ?   ?   ?   ?   ?   ?   ?   ?   ?   ?   ?   ?   ?   ?   ?   ?   ?   ?   ?   ?   ?   ?   ?   ?   ?   ?   ?   ?   ?   ?   ?   ?   ?   ?   ?   ?   ?   ?   ?   ?   ?   ?   ?   ?   ?   ?   ?PMR Admission Coordinator Pre-Admission Assessment ?  ?Patient: Samantha [name] is an 66 y.o., female ?MRN: [id number] ?DOB: 29-Sep-1956 ?Height: 5' (152.4 cm) ?Weight: 130.6 kg ?  ?Insurance Information ?HMO:     PPO:      PCP:      IPA:      80/20:      OTHER:  ?PRIMARY: [employer]      Policy#: [id number]      Subscriber: pt ?CM Name: Carey Bullocks.  Phone#: [phone]     Fax#: [phone] ?Pre-Cert#: [id number] approved for 7 days and f/u with [place] phone [phone] fax [phone]      Employer:  ?Benefits:  Phone #: [phone]     Name: 3/21 ?Eff. Date: 10/29/2021     Deduct: $8800      Out of Pocket Max: $9100      Life Max: none ?CIR: 50%      SNF: 50% 90 days ?Outpatient: 50%     Co-Pay: per medical neccesity ?Home Health: 50%      Co-Pay: per medical neccesity ?DME: 50%     Co-Pay: 50% ?Providers: in network ? ?SECONDARY: none ?  ?Financial Counselor:       Phone#:  ?  ?The ?Data Collection Information Summary? for patients in  Inpatient Rehabilitation Facilities with attached ?Privacy Act [place] Records? was provided and verbally reviewed with: N/A ?  ?Emergency Contact Information ?Contact Information   ?  ?  Name Relation Home Work Mobile  ?  Jeweldean, [place] Daughter     [phone]  ?  Newville,[name] Spouse     [phone]  ?  ?   ?  ?Current Medical History  ?Patient Admitting Diagnosis: CVA ?  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   Presented 01/13/2022 with right side weakness of acute onset.  Cranial CT scan showed new white matter changes on the left primarily involving the corona radiata abutting the body of the left caudate.  There was also a lacunar infarct in the left thalamus not identified on previous study.  CT angiogram head and neck occlusion versus severe stenosis of proximal right M2 MCA branch with distal reconstitution.  Severe proximal left M2 MCA stenosis.  MRI of the brain 01/14/2022 confluent acute white matter infarct of the left corona radiata.  No associated hemorrhage or mass effect.  Superimposed punctate acute on chronic right cerebellar SCA territory ischemia.  Admission chemistry sodium 134 glucose 494 BUN 25 creatinine 1.26, troponin 25, hemoglobin A1c 13.1.  Echocardiogram with ejection fraction of 55 to 60% no wall motion abnormalities.  Currently maintained on aspirin 325 mg daily and Plavix 75 mg daily for CVA prophylaxis x3 months then aspirin alone given intracranial severe stenosis.  Lovenox for DVT prophylaxis.  Tolerating a regular diet.   ?  ?Complete NIHSS TOTAL: 9 ?  ?Patient's medical record from [facility] has been reviewed by the rehabilitation admission coordinator and physician. ?  ?Past Medical History  ?History reviewed. No pertinent past medical history. ?  ?Has  the patient had major surgery during 100 days prior to admission? No ?  ?Family History   ?family history includes Hypertension in her mother. ?  ?Current Medications ?  ?Current Facility-Administered Medications:  ?  acetaminophen (TYLENOL) tablet 650 mg, 650 mg, Oral, Q4H PRN **OR** acetaminophen (TYLENOL) 160 MG/5ML solution 650 mg, 650 mg, Per Tube, Q4H PRN **OR** acetaminophen (TYLENOL) suppository 650 mg, 650 mg, Rectal, Q4H PRN, Smith, Rondell A, MD ?  amLODipine (NORVASC) tablet 10 mg, 10 mg, Oral, Daily, [place] Donnamarie Poag, DO, 10 mg at 01/17/22 1037 ?  aspirin EC tablet 325 mg, 325 mg, Oral, Daily, Rosalin Hawking, MD, 325 mg at 01/17/22 1037 ?  atorvastatin (LIPITOR) tablet 80 mg, 80 mg, Oral, q1800, Fuller Plan A, MD, 80 mg at 01/16/22 1723 ?  clopidogrel (PLAVIX) tablet 75 mg, 75 mg, Oral, Daily, Tamala Julian, Rondell A, MD, 75 mg at 01/17/22 1037 ?  enoxaparin (LOVENOX) injection 40 mg, 40 mg, Subcutaneous, Q24H, Smith, Rondell A, MD, 40 mg at 01/16/22 1723 ?  insulin aspart (novoLOG) injection 0-15 Units, 0-15 Units, Subcutaneous, TID WC, Norval Morton, MD, 5 Units at 01/17/22 [id number] ?  insulin aspart (novoLOG) injection 0-5 Units, 0-5 Units, Subcutaneous, QHS, Norval Morton, MD, 2 Units at 01/14/22 2100 ?  insulin aspart (novoLOG) injection 6 Units, 6 Units, Subcutaneous, TID WC, [place] Eric J, DO, 6 Units at 01/17/22 1037 ?  insulin glargine-yfgn (SEMGLEE) injection 45 Units, 45 Units, Subcutaneous, Daily, [place] Eric J, [place], [place] Units at 01/17/22 1038 ?  sodium chloride 0.9 % bolus 500 mL, 500 mL, Intravenous, Once, Trifan, Carola Rhine, MD ?  ?Patients Current Diet:  ?Diet Order   ?  ?         ?    Diet heart healthy/carb modified Room service appropriate? Yes; Fluid consistency: Thin  Diet effective now       ?  ?  ?   ?  ?  ?   ?  ?Precautions / Restrictions ?Precautions ?Precautions: Fall ?Restrictions ?Weight Bearing Restrictions: No  ?  ?Has the patient had 2 or more falls or a fall with injury in  the past year? No ?  ?Prior Activity Level ?Community (5-7x/wk): independent ?  ?Prior Functional Level ?Self Care: Did the patient need help bathing, dressing, using the toilet or eating? Independent ?  ?Indoor Mobility: Did the patient need assistance with walking from room to room (with or without device)? Independent ?  ?Stairs: Did the patient need assistance with internal or external stairs (with or without device)? Independent ?  ?Functional Cognition: Did the patient need help planning regular tasks such as shopping or remembering to take medications? Independent ?  ?Patient Information ?Are you of Hispanic, Latino/a,or Spanish origin?: A. No, not of Hispanic, Latino/a, or Spanish origin ?What is your race?: B. Black or African American ?Do you need or want an interpreter to communicate with a doctor or health care staff?: 0. No ?  ?Patient's Response To:  ?Health Literacy and Transportation ?Is the patient able to respond to health literacy and transportation needs?: Yes ?Health Literacy - How often do you need to have someone help you when you read instructions, pamphlets, or other written material from your doctor or pharmacy?: Never ?In the past 12 months, has lack of transportation kept you from medical appointments or from getting medications?: No ?In the past 12 months, has lack of transportation kept you from meetings, work, or from getting things needed for daily living?: No ?  ?Home Assistive Devices / Equipment ?Home Assistive Devices/Equipment: None ?Home Equipment: Rolling Walker (2 wheels), BSC/3in1 ?  ?Prior Device Use: Indicate devices/aids used by the patient prior to current illness, exacerbation or injury? None of the above ?  ?Current Functional Level ?Cognition ?  Arousal/Alertness: Awake/alert ?Overall Cognitive Status: Impaired/Different from baseline ?Orientation Level: Oriented X4 ?Safety/Judgement: Decreased awareness of safety, Decreased awareness of deficits ?General Comments: At  times needing increased time to respond/follow commands ?Attention: Focused, Sustained ?Memory: Impaired ?Memory Impairment: Retrieval deficit, Decreased short term memory (Imediate: 5/5 with 4 repetition

## 2022-01-17 NOTE — Progress Notes (Signed)
Inpatient Rehabilitation Admissions Coordinator  ? ?I have insurance approval and Cir bed to admit her to today. I have alerted acute team and TOC and will make the arrangements to admit today. ? ?Ottie Glazier, RN, MSN ?Rehab Admissions Coordinator ?[phone] ?01/17/2022 1:43 PM ? ?

## 2022-01-17 NOTE — Progress Notes (Signed)
Inpatient Rehabilitation Admissions Coordinator  ? ?I await insurance approval for possible CIR admit. ? ?Danne Baxter, RN, MSN ?Rehab Admissions Coordinator ?[phone] ?01/17/2022 10:56 AM ? ?

## 2022-01-17 NOTE — Telephone Encounter (Signed)
Patient wanted to know when she established care here at [facility], due to having stroke around that time. Patient advised her first appt here was 09/2018. ?

## 2022-01-17 NOTE — TOC Transition Note (Signed)
Transition of Care (TOC) - CM/SW Discharge Note ? ? ?Patient Details  ?Name: Samantha [name] ?MRN: [id number] ?Date of Birth: 05/02/1956 ? ?Transition of Care (TOC) CM/SW Contact:  ?Kermit Balo, RN ?Phone Number: ?01/17/2022, 2:41 PM ? ? ?Clinical Narrative:    ?Patient discharging to CIR today. CM signing off.  ? ? ?Final next level of care: IP Rehab Facility ?Barriers to Discharge: No Barriers Identified ? ? ?Patient Goals and CMS Choice ?  ?  ?Choice offered to / list presented to : Patient ? ?Discharge Placement ?  ?           ?  ?  ?  ?  ? ?Discharge Plan and Services ?  ?Discharge Planning Services: CM Consult ?           ?  ?  ?  ?  ?  ?  ?  ?  ?  ?  ? ?Social Determinants of Health (SDOH) Interventions ?  ? ? ?Readmission Risk Interventions ?   ? View : No data to display.  ?  ?  ?  ? ? ? ? ? ?

## 2022-01-17 NOTE — Discharge Summary (Signed)
Physician Discharge Summary  ?Samantha [name] [id number] DOB: 05/08/1956 DOA: 01/13/2022 ? ?PCP: Trey SailorsPollak, Adriana M, PA-C (Inactive) ? ?Admit date: 01/13/2022 ?Discharge date: 01/17/2022 ? ?Time spent: 40 minutes ? ?Recommendations for Outpatient Follow-up:  ?Follow outpatient CBC/CMP  ?Follow with neurology outpatient ?A1c 13, follow on insulin - new at discharge, needs continued teaching in CIR ?Follow BP - adjust regimen as needed, holding HCTZ and lisinopril with aki at presentation, these can be resumed as tolerated in future ?Follow obesity outpatient   ? ?Discharge Diagnoses:  ?Principal Problem: ?  CVA (cerebral vascular accident) (HCC) ?Active Problems: ?  Type 2 diabetes mellitus without complication, without long-term current use of insulin (HCC) ?  AKI (acute kidney injury) (HCC) ?  Hypertension ?  Hyperlipidemia associated with type 2 diabetes mellitus (HCC) ?  Hyponatremia ?  Elevated troponin ?  Morbid obesity (HCC) ? ? ?Discharge Condition: stable ? ?Diet recommendation: heart healthy, diabetic ? ?Filed Weights  ? 01/16/22 1700  ?Weight: 130.6 kg  ? ? ?History of present illness:  ?Samantha [name] is Samantha [name] 66 year old female with past medical history significant for type 2 diabetes mellitus, essential hypertension, hyperlipidemia, morbid obesity who [facility] ED [facility] 3/18 via EMS with Samantha [name] 5-day history of right upper extremity weakness.  She was intermittently confused at presentation and also was noted to be unsteady on her feet.  She had MRI that showed confluent acute white matter infarct of the L corona radiata and superimposed punctate acute on chronic right cerebellar Sca territory ischemia.  Neurology was consulted and recommended DAPT x 3 months, then aspirin alone.  Plan at this point is discharge to CIR. ? ?See below for additional details ? ?Hospital Course:  ?Assessment and Plan: ?* CVA (cerebral vascular accident) (HCC) ?Patient presenting with Samantha [name] 5-day history of paralysis of her right arm/leg,  slurred speech, gait disturbance, and confusion.   ?CT head with new white matter changes on L primarily involving the corona radiata, abutting the body of the L caudate, lacunar infarct in l thalamus ?CTA head/neck with occlusion vs severe stenosis of proximal R M2 MCA branch with distal reconstitution, severe proximal L M2MCA stenosis, moderate stenossi of L vertebral artery at dural margin, mild to moderate bilateral intracranial ICA and bilateral P2 PCA stenoses ?MRI brain with acute white matter infarct of L corona radiata, superimposed punctate acute on chronic R cerebellar SCA territory ischemia, chronic L thalamic lacune ?MRA head with advanced intracranial atherosclerosis ?TTE: LVEF 55-60%, no regional wall motion abnormalities, IVC normal, no interatrial shunt visualized Hemoglobin A1c 13.1, LDL 115.   ?Neurology was consulted and followed during initial hospital course, now signed off. ?--DAPT w/ Aspirin 325 mg p.o. daily and Plavix 75 mg p.o. daily x 3 months followed by aspirin alone for severe intracranial stenosis. ?--Atorvastatin 80 mg p.o. daily ?--PT/OT/SLP: Recommend CIR, pending insurance authorization -> plan for CiR today ?--Continue to monitor on telemetry ? ? ? ?Type 2 diabetes mellitus without complication, without long-term current use of insulin (HCC) ?Patient is initial glucose elevated at 494 without elevated anion gap.  Home medication regimen includes Amaryl 4 mg PO daily.  Hemoglobin A1c 13.1, poorly controlled. ?-- with high a1c, she'll need to discharge on insulin, continue education in CIR ?--Increase Semglee to 45 units daily ?--NovoLog 6 units TIDAC ? ? ? ?AKI (acute kidney injury) (HCC) ?Renal US without hydro, L kidney smaller than R with lobular contour ?Creatinine appears back to baseline ?Currently holding HCTZ and lisinopril ? ? ?Hypertension ?On  admission blood pressures elevated up to 182/98.  Home blood pressure regimen includes amlodipine 10 mg daily,  hydrochlorothiazide 25 mg daily, and lisinopril 40 mg daily. ?--continue amlodipine ?--holding HCTZ and lisinopril due to AKI --- consider resumption if needed going forward ? ? ?Hyperlipidemia associated with type 2 diabetes mellitus (HCC) ?On admission LDL 115.  Home medication regimen included atorvastatin 10 mg daily. ?--Goal LDL less than 70 ?--Increased atorvastatin to 80 mg daily ? ?Hyponatremia ?resolved ? ?Elevated troponin ?High-sensitivity troponin elevated at 25>37.  Remains chest pain-free.  EKG with NSR, rate 90, QTc 474, T wave inversion 1, aVL, no other concerning dynamic changes.  No previous EKG available for review in EMR. ?--Continue to monitor on telemetry ? ?Morbid obesity (HCC) ?Body mass index is 56.25 kg/m?Marland Kitchen  Discussed with patient needs for aggressive lifestyle changes/weight loss as this complicates all facets of care.  Outpatient follow-up with PCP.  May benefit from bariatric evaluation outpatient. ? ? ? ?Procedures: ?Echo ?IMPRESSIONS  ? ? ? 1. Left ventricular ejection fraction, by estimation, is 55 to 60%. The  ?left ventricle has normal function. The left ventricle has no regional  ?wall motion abnormalities. There is severe left ventricular hypertrophy.  ?Indeterminate diastolic filling due  ?to E-Jamisen Hawes fusion.  ? 2. Right ventricular systolic function is normal. The right ventricular  ?size is normal. Tricuspid regurgitation signal is inadequate for assessing  ?PA pressure.  ? 3. No evidence of mitral valve regurgitation.  ? 4. Aortic valve regurgitation is not visualized.  ? 5. The inferior vena cava is normal in size with greater than 50%  ?respiratory variability, suggesting right atrial pressure of 3 mmHg.   ? ?Consultations: ?neurology ? ?Discharge Exam: ?Vitals:  ? 01/17/22 0841 01/17/22 1129  ?BP: (!) 149/75 (!) 150/71  ?Pulse: 81 87  ?Resp: 14 16  ?Temp: 98.5 ?F (36.9 ?C) 98.6 ?F (37 ?C)  ?SpO2: 97% 96%  ? ?No new complaints ?Awaiting CIR ?Discussed with patient and husband at  bedside ? ?General: No acute distress. ?Cardiovascular: RRR ?Lungs: unlabored ?Abdomen: Soft, nontender, nondistended  ?Neurological: Alert and oriented ?3. R sided weakness (0/5 RUE, 2/5 to RLE) ?Skin: Warm and dry. No rashes or lesions. ?Extremities: No clubbing or cyanosis. No edema ?Discharge Instructions ? ? ?Discharge Instructions   ? ? Ambulatory referral to Neurology   Complete by: As directed ?  ? Follow up with stroke clinic NP (Jessica Seneca or Darrol Angel, if both not available, consider Manson Allan, or Ahern) at [facility] in about 4 weeks. Thanks.  ? Call MD for:  difficulty breathing, headache or visual disturbances   Complete by: As directed ?  ? Call MD for:  extreme fatigue   Complete by: As directed ?  ? Call MD for:  hives   Complete by: As directed ?  ? Call MD for:  persistant dizziness or light-headedness   Complete by: As directed ?  ? Call MD for:  persistant nausea and vomiting   Complete by: As directed ?  ? Call MD for:  redness, tenderness, or signs of infection (pain, swelling, redness, odor or green/yellow discharge around incision site)   Complete by: As directed ?  ? Call MD for:  severe uncontrolled pain   Complete by: As directed ?  ? Call MD for:  temperature >100.4   Complete by: As directed ?  ? Diet - low sodium heart healthy   Complete by: As directed ?  ? Discharge instructions   Complete by:  As directed ?  ? You were seen for Cobin Cadavid stroke. ? ?We've started you on aspirin and plavix.  You'll continue aspirin and plavix for 3 months, then transition to aspirin alone.  We've increased your lipitor to 80 mg daily.  You were seen by neurology here and will need to follow up with neurology outpatient. ? ?Your diabetes is poorly controlled and you'll need to transition to insulin.  They can do additional teaching for you in inpatient rehab.  ? ?I'm stopping your HCTZ and lisinopril for now due to kidney injury which was present when you were admitted.  Continue to follow up your  blood pressure outpatient, they'll adjust this as needed. ? ?Return for new, recurrent, or worsening symptoms. ? ?Please ask your PCP to request records from this hospitalization so they know what was done and what the

## 2022-01-18 LAB — CBC WITH DIFFERENTIAL/PLATELET
Abs Immature Granulocytes: 0.03 10*3/uL (ref 0.00–0.07)
Basophils Absolute: 0 10*3/uL (ref 0.0–0.1)
Basophils Relative: 0 %
Eosinophils Absolute: 0.5 10*3/uL (ref 0.0–0.5)
Eosinophils Relative: 6 %
HCT: 35.6 % — ABNORMAL LOW (ref 36.0–46.0)
Hemoglobin: 11.5 g/dL — ABNORMAL LOW (ref 12.0–15.0)
Immature Granulocytes: 0 %
Lymphocytes Relative: 27 %
Lymphs Abs: 2.2 10*3/uL (ref 0.7–4.0)
MCH: 31.9 pg (ref 26.0–34.0)
MCHC: 32.3 g/dL (ref 30.0–36.0)
MCV: 98.9 fL (ref 80.0–100.0)
Monocytes Absolute: 0.7 10*3/uL (ref 0.1–1.0)
Monocytes Relative: 8 %
Neutro Abs: 4.8 10*3/uL (ref 1.7–7.7)
Neutrophils Relative %: 59 %
Platelets: 169 10*3/uL (ref 150–400)
RBC: 3.6 MIL/uL — ABNORMAL LOW (ref 3.87–5.11)
RDW: 12.1 % (ref 11.5–15.5)
WBC: 8.2 10*3/uL (ref 4.0–10.5)
nRBC: 0 % (ref 0.0–0.2)

## 2022-01-18 LAB — COMPREHENSIVE METABOLIC PANEL
ALT: 21 U/L (ref 0–44)
AST: 24 U/L (ref 15–41)
Albumin: 2.7 g/dL — ABNORMAL LOW (ref 3.5–5.0)
Alkaline Phosphatase: 68 U/L (ref 38–126)
Anion gap: 6 (ref 5–15)
BUN: 24 mg/dL — ABNORMAL HIGH (ref 8–23)
CO2: 26 mmol/L (ref 22–32)
Calcium: 9.1 mg/dL (ref 8.9–10.3)
Chloride: 108 mmol/L (ref 98–111)
Creatinine, Ser: 1.01 mg/dL — ABNORMAL HIGH (ref 0.44–1.00)
GFR, Estimated: >60 mL/min (ref >60)
Glucose, Bld: 115 mg/dL — ABNORMAL HIGH (ref 70–99)
Potassium: 4.2 mmol/L (ref 3.5–5.1)
Sodium: 140 mmol/L (ref 135–145)
Total Bilirubin: 1 mg/dL (ref 0.3–1.2)
Total Protein: 5.9 g/dL — ABNORMAL LOW (ref 6.5–8.1)

## 2022-01-18 LAB — GLUCOSE, CAPILLARY
Glucose-Capillary: 122 mg/dL — ABNORMAL HIGH (ref 70–99)
Glucose-Capillary: 154 mg/dL — ABNORMAL HIGH (ref 70–99)
Glucose-Capillary: 119 mg/dL — ABNORMAL HIGH (ref 70–99)

## 2022-01-18 LAB — VITAMIN D 25 HYDROXY (VIT D DEFICIENCY, FRACTURES): Vit D, 25-Hydroxy: 24.03 ng/mL — ABNORMAL LOW (ref 30–100)

## 2022-01-18 MED ORDER — VITAMIN D (ERGOCALCIFEROL) 1.25 MG (50000 UNIT) PO CAPS
50000.0000 [IU] | ORAL_CAPSULE | ORAL | Status: DC
  Administered 2022-01-18 – 2022-02-08 (×4): 50000 [IU] via ORAL
  Filled 2022-01-18 (×4): qty 1

## 2022-01-18 NOTE — Progress Notes (Addendum)
Inpatient Rehabilitation Admission Medication Review by a Pharmacist ? ?A complete drug regimen review was completed for this patient to identify any potential clinically significant medication issues. ? ?High Risk Drug Classes Is patient taking? Indication by Medication  ?Antipsychotic No   ?Anticoagulant Yes Lovenox - VTE ppx  ?Antibiotic No   ?Opioid No   ?Antiplatelet Yes ASA, plavix - CVA  ?Hypoglycemics/insulin Yes iSS, semglee - T2DM  ?Vasoactive Medication Yes Norvasc - HTN  ?Chemotherapy No   ?Other Yes Lipitor - HLD ?Melatonin - sleep  ? ? ? ?Type of Medication Issue Identified Description of Issue Recommendation(s)  ?Drug Interaction(s) (clinically significant) ?    ?Duplicate Therapy ?    ?Allergy ?    ?No Medication Administration End Date ?    ?Incorrect Dose ?    ?Additional Drug Therapy Needed ?    ?Significant med changes from prior encounter (inform family/care partners about these prior to discharge).    ?Other ? PTA Meds: ?Glimepiride ?Hydrochlorothiazide ?Lisinopril  Restart PTA meds if and when clinically necessary during CIR admission or at time of discharge if warranted.  ? ? ?Clinically significant medication issues were identified that warrant physician communication and completion of prescribed/recommended actions by midnight of the next day:  No ? ?Time spent performing this drug regimen review (minutes):  30 ? ? ?Dani Anastasovites BS ?Pharmacy Student ?01/18/2022 8:30 AM ? ?I discussed / reviewed the pharmacy note by Ms. Anastasovites and I agree with the resident?s findings and plans as documented.  ? ?Thank you. ?Okey Regal, PharmD ? ? ?  ?

## 2022-01-18 NOTE — Evaluation (Signed)
Physical Therapy Assessment and Plan ? ?Patient Details  ?Name: Samantha [name] ?MRN: [id number] ?Date of Birth: 22-Nov-1955 ? ?PT Diagnosis: Abnormal posture, Abnormality of gait, Cognitive deficits, Difficulty walking, Hemiparesis dominant, Impaired cognition, and Impaired sensation ?Rehab Potential: Fair ?ELOS: 3 weeks  ? ?Today's Date: 01/18/2022 ?PT Individual Time: 4801-6553 ?PT Individual Time Calculation (min): 55 min   ? ?Hospital Problem: Principal Problem: ?  CVA (cerebral vascular accident) (HCC) ? ? ?Past Medical History: History reviewed. No pertinent past medical history. ?Past Surgical History:  ?Past Surgical History:  ?Procedure Laterality Date  ? TUBAL LIGATION    ? 23 yrs ago  ? ? ?Assessment & Plan ?Clinical Impression: Patient is a 66 year old right-handed female with history of obesity BMI greater than 56, hypertension hyperlipidemia diabetes mellitus type 2 as well as remote right cerebellar infarction identified on CT imaging November 2020.  Per chart review lives with spouse.  1 level home 3 steps to entry.  Independent with driving and ADLs and did not require assistive device.  Presented 01/13/2022 with right side weakness of acute onset.  Cranial CT scan showed new white matter changes on the left primarily involving the corona radiata abutting the body of the left caudate.  There was also a lacunar infarct in the left thalamus not identified on previous study.  CT angiogram head and neck occlusion versus severe stenosis of proximal right M2 MCA branch with distal reconstitution.  Severe proximal left M2 MCA stenosis.  MRI of the brain 01/14/2022 confluent acute white matter infarct of the left corona radiata.  No associated hemorrhage or mass effect.  Superimposed punctate acute on chronic right cerebellar SCA territory ischemia.  Admission chemistry sodium 134 glucose 494 BUN 25 creatinine 1.26, troponin 25, hemoglobin A1c 13.1.  Echocardiogram with ejection fraction of 55 to 60% no wall  motion abnormalities.  Currently maintained on aspirin 325 mg daily and Plavix 75 mg daily for CVA prophylaxis x3 months then aspirin alone given intracranial severe stenosis.  Lovenox for DVT prophylaxis.  Tolerating a regular diet.  Therapy evaluations completed due to patient's right side weakness was admitted for a comprehensive rehab program. Currently complains of weakness. Patient transferred to CIR on 01/17/2022 .  ? ?Patient currently requires total with mobility secondary to muscle weakness, decreased cardiorespiratoy endurance, decreased motor planning, decreased attention to right, decreased initiation, decreased awareness, decreased problem solving, decreased safety awareness, decreased memory, and delayed processing, and decreased sitting balance, decreased standing balance, decreased postural control, hemiplegia, and decreased balance strategies.  Prior to hospitalization, patient was independent  with mobility and lived with Spouse in a House home.  Home access is 3 stepsStairs to enter. ? ?Patient will benefit from skilled PT intervention to maximize safe functional mobility, minimize fall risk, and decrease caregiver burden for planned discharge home with 24 hour assist.  Anticipate patient will benefit from follow up [facility] at discharge. ? ?PT - End of Session ?Activity Tolerance: Tolerates < 10 min activity with changes in vital signs ?Endurance Deficit: Yes ?Endurance Deficit Description: very poor sitting/standing endurance ?PT Assessment ?Rehab Potential (ACUTE/IP ONLY): Fair ?PT Barriers to Discharge: Decreased caregiver support;Home environment access/layout;Insurance for SNF coverage;Weight;Incontinence ?PT Patient demonstrates impairments in the following area(s): Balance;Endurance;Motor;Safety;Skin Integrity ?PT Transfers Functional Problem(s): Bed Mobility;Bed to Chair;Car ?PT Locomotion Functional Problem(s): Ambulation;Stairs;Wheelchair Mobility ?PT Plan ?PT Intensity: Minimum of 1-2 x/day  ,45 to 90 minutes ?PT Frequency: 5 out of 7 days ?PT Duration Estimated Length of Stay: 3 weeks ?PT Treatment/Interventions: Balance/vestibular training;Community reintegration;Disease management/prevention;Functional  electrical stimulation;Neuromuscular re-education;Patient/family education;Stair training;Therapeutic Exercise;Wheelchair propulsion/positioning;Skin care/wound management;UE/LE Coordination activities;Visual/perceptual remediation/compensation;UE/LE Strength taining/ROM;Therapeutic Activities;Splinting/orthotics;Psychosocial support;Pain management;DME/adaptive equipment instruction;Functional mobility training;Discharge planning;Cognitive remediation/compensation;Ambulation/gait training ?PT Transfers Anticipated Outcome(s): minA with LRAD ?PT Locomotion Anticipated Outcome(s): minA with LRAD ?PT Recommendation ?Recommendations for Other Services: Neuropsych consult ?Follow Up Recommendations: Home health PT;24 hour supervision/assistance ?Patient destination: Home ?Equipment Recommended: To be determined ? ? ?PT Evaluation ?Precautions/Restrictions ?Precautions ?Precautions: Fall ?Precaution Comments: R hemi (RUE > RLE) ?Restrictions ?Weight Bearing Restrictions: No ?Pain ?Pain Assessment ?Pain Scale: 0-10 ?Pain Score: 0-No pain ?Pain Interference ?Pain Interference ?Pain Effect on Sleep: 0. Does not apply - I have not had any pain or hurting in the past 5 days ?Pain Interference with Therapy Activities: 0. Does not apply - I have not received rehabilitationtherapy in the past 5 days ?Pain Interference with Day-to-Day Activities: 1. Rarely or not at all ?Home Living/Prior Functioning ?Home Living ?Available Help at Discharge: Family;Available 24 hours/day ?Type of Home: House ?Home Access: Stairs to enter ?Entrance Stairs-Number of Steps: 3 steps ?Entrance Stairs-Rails: Can reach both ?Home Layout: One level ?Bathroom Shower/Tub: Walk-in shower ?Bathroom Toilet: Standard ?Bathroom Accessibility:  Yes ?Additional Comments: 3 steps to enter home; single level once inside ? Lives With: Spouse ?Prior Function ?Level of Independence: Independent with basic ADLs;Independent with transfers;Independent with gait;Independent with homemaking with ambulation ? Able to Take Stairs?: Yes ?Driving: Yes ?Vocation: Retired ?Vocation Requirements: Community education officer ?Leisure: Hobbies-yes (Comment) (playing with her grandkids) ?Vision/Perception  ?Vision - History ?Ability to See in Adequate Light: 0 Adequate ?Perception ?Perception: Within Functional Limits ?Praxis ?Praxis: Intact  ?Cognition ?Overall Cognitive Status: Impaired/Different from baseline ?Arousal/Alertness: Awake/alert ?Orientation Level: Oriented X4 ?Attention: Focused;Sustained ?Focused Attention: Appears intact ?Sustained Attention: Appears intact ?Memory: Impaired ?Memory Impairment: Retrieval deficit;Decreased short term memory ?Decreased Short Term Memory: Verbal basic;Functional basic ?Awareness: Impaired ?Awareness Impairment: Emergent impairment;Anticipatory impairment ?Problem Solving: Impaired ?Problem Solving Impairment: Functional basic;Verbal basic ?Executive Function: Sequencing;Organizing ?Sequencing: Impaired ?Organizing: Impaired ?Organizing Impairment: Functional basic ?Safety/Judgment: Impaired ?Sensation ?Sensation ?Light Touch: Appears Intact ?Hot/Cold: Appears Intact ?Proprioception: Impaired Detail ?Proprioception Impaired Details: Absent RUE;Absent RLE ?Stereognosis: Impaired by gross assessment ?Coordination ?Gross Motor Movements are Fluid and Coordinated: No ?Fine Motor Movements are Fluid and Coordinated: No ?Coordination and Movement Description: Impacted by dense R sided weakness ?Finger Nose Finger Test: UTA RUE; WNL LUE ?Heel Shin Test: UTA RLE due to weakness ?Motor  ?Motor ?Motor: Hemiplegia ?Motor - Skilled Clinical Observations: Right sided  ? ?Trunk/Postural Assessment  ?Cervical Assessment ?Cervical Assessment: Exceptions  to [facility] (forward head) ?Thoracic Assessment ?Thoracic Assessment: Exceptions to [facility] (rounded shoulders) ?Lumbar Assessment ?Lumbar Assessment: Exceptions to [facility] (posterior pelvic tilt) ?Postural Control ?Postu

## 2022-01-18 NOTE — Progress Notes (Signed)
Inpatient Rehabilitation  Patient information reviewed and entered into eRehab system by Alean Kromer Katie Faraone, OTR/L.   Information including medical coding, functional ability and quality indicators will be reviewed and updated through discharge.    

## 2022-01-18 NOTE — Progress Notes (Signed)
?                                                       PROGRESS NOTE ? ? ?Subjective/Complaints: ?She has no new complaints this morning ?Discussed elevated CBGs with her ?Slept better last night ?Had a bath this morning and found it exhausting. ? ?ROS: insomnia resolved ? ? ?Objective: ?  ?No results found. ?Recent Labs  ?  01/17/22 ?2146 01/18/22 ?0542  ?WBC 8.8 8.2  ?HGB 11.3* 11.5*  ?HCT 36.9 35.6*  ?PLT 155 169  ? ?Recent Labs  ?  01/17/22 ?0345 01/18/22 ?0542  ?NA 137 140  ?K 4.4 4.2  ?CL 105 108  ?CO2 25 26  ?GLUCOSE 174* 115*  ?BUN 27* 24*  ?CREATININE 1.06* 1.01*  ?CALCIUM 8.9 9.1  ? ? ?Intake/Output Summary (Last 24 hours) at 01/18/2022 1821 ?Last data filed at 01/18/2022 1435 ?Gross per 24 hour  ?Intake 598 ml  ?Output --  ?Net 598 ml  ?  ? ?  ? ?Physical Exam: ?Vital Signs ?Blood pressure 138/65, pulse 86, temperature 98.8 ?F (37.1 ?C), resp. rate 17, height 5' (1.524 m), weight 126.3 kg, SpO2 93 %. ?Gen: no distress, normal appearing ?HEENT: oral mucosa pink and moist, NCAT ?Cardio: Reg rate ?Chest: normal effort, normal rate of breathing ?Abd: soft, non-distended ?Ext: no edema ?Psych: pleasant, normal affect ?Skin: intact ?Neurological:  ?   Comments: Patient is alert.  No acute distress.  Makes eye contact with examiner.  Oriented to person and place.  Fair awareness of deficits. RUE 0/5, RLE 2/5, Left side 5/5. Sensation intact. ? ? ?Assessment/Plan: ?1. Functional deficits which require 3+ hours per day of interdisciplinary therapy in a comprehensive inpatient rehab setting. ?Physiatrist is providing close team supervision and 24 hour management of active medical problems listed below. ?Physiatrist and rehab team continue to assess barriers to discharge/monitor patient progress toward functional and medical goals ? ?Care Tool: ? ?Bathing ?   ?Body parts bathed by patient: Buttocks, Front perineal area, Right upper leg, Left upper leg  ?   ?  ?  ?Bathing assist Assist Level: Total Assistance -  Patient < 25% ?  ?  ?Upper Body Dressing/Undressing ?Upper body dressing   ?  ?   ?Upper body assist Assist Level: Total Assistance - Patient < 25% ?   ?Lower Body Dressing/Undressing ?Lower body dressing ? ? ?   ?  ? ?  ? ?Lower body assist Assist for lower body dressing: Total Assistance - Patient < 25% ?   ? ?Toileting ?Toileting    ?Toileting assist Assist for toileting: 2 Helpers ?  ?  ?Transfers ?Chair/bed transfer ? ?Transfers assist ? Chair/bed transfer activity did not occur: Safety/medical concerns ? ?Chair/bed transfer assist level: Dependent - mechanical lift Antony Salmon) ?  ?  ?Locomotion ?Ambulation ? ? ?Ambulation assist ? ? Ambulation activity did not occur: Safety/medical concerns ? ?  ?  ?   ? ?Walk 10 feet activity ? ? ?Assist ? Walk 10 feet activity did not occur: Safety/medical concerns ? ?  ?   ? ?Walk 50 feet activity ? ? ?Assist Walk 50 feet with 2 turns activity did not occur: Safety/medical concerns ? ?  ?   ? ? ?Walk 150 feet activity ? ? ?Assist Walk 150 feet activity did not occur:  Safety/medical concerns ? ?  ?  ?  ? ?Walk 10 feet on uneven surface  ?activity ? ? ?Assist Walk 10 feet on uneven surfaces activity did not occur: Safety/medical concerns ? ? ?  ?   ? ?Wheelchair ? ? ? ? ?Assist Is the patient using a wheelchair?: Yes ?Type of Wheelchair: Manual ?  ? ?Wheelchair assist level: Dependent - Patient 0% ?Max wheelchair distance: 0  ? ? ?Wheelchair 50 feet with 2 turns activity ? ? ? ?Assist ? ?  ?  ? ? ?Assist Level: Dependent - Patient 0%  ? ?Wheelchair 150 feet activity  ? ? ? ?Assist ?   ? ? ?Assist Level: Dependent - Patient 0%  ? ?Blood pressure 138/65, pulse 86, temperature 98.8 ?F (37.1 ?C), resp. rate 17, height 5' (1.524 m), weight 126.3 kg, SpO2 93 %. ? ?Medical Problem List and Plan: ?1. Functional deficits secondary to left CR confluent infarct along with punctate left BG, thalamus and right cerebellum infarct as well as history of remote right cerebellar infarct identified  on CT imaging November 2020 ?            -patient may shower ?            -ELOS/Goals: 14-21 days MinA PT, S OT, S SLP ?            -Messaged Misty Stanley to schedule HFU ?2.  Antithrombotics: ?-DVT/anticoagulation:  Pharmaceutical: Lovenox ?            -antiplatelet therapy: Aspirin 325 mg daily and Plavix 75 mg daily x3 months then aspirin alone ?3. Pain Management: Tylenol as needed ?4. Mood: Provide emotional support ?            -antipsychotic agents: N/A ?5. Neuropsych: This patient is capable of making decisions on her own behalf. ?6. Skin/Wound Care: Routine skin checks ?7. Fluids/Electrolytes/Nutrition: Routine in and outs with follow-up chemistries ?8.  Diabetes mellitus.  Hemoglobin A1c 13.1.  Insulin therapy as directed with diabetic teaching. Educated regarding avoiding foods with added sugar.  ?9.  Hypertension.  Norvasc 10 mg daily.  Monitor with increased mobility. Add magnesium gluconate 250mg  HS.  ?10.  Hyperlipidemia.  Lipitor ?11.  Morbid obesity.  BMI greater than 56.  Dietary follow-up ?12. Vitamin D deficiency: start ergocalciferol 50,000U once per week for 7 weeks ?13. Insomnia: resolved with melatonin 3mg  HS ?14. AKI: improving, place nursing order to encourage 6-8 glasses of water per day ? ? ?LOS: ?1 days ?A FACE TO FACE EVALUATION WAS PERFORMED ? ? P Anders Hohmann ?01/18/2022, 6:21 PM  ? ?  ?

## 2022-01-18 NOTE — Discharge Instructions (Addendum)
Inpatient Rehab Discharge Instructions ? ?Samantha [name] ?Discharge date and time: No discharge date for patient encounter.  ? ?Activities/Precautions/ Functional Status: ?Activity: activity as tolerated ?Diet: diabetic diet ?Wound Care: Routine skin checks ?Functional status:  ?___ No restrictions     ___ Walk up steps independently ?___ 24/7 supervision/assistance   ___ Walk up steps with assistance ?___ Intermittent supervision/assistance  ___ Bathe/dress independently ?___ Walk with walker     __x_ Bathe/dress with assistance ?___ Walk Independently    ___ Shower independently ?___ Walk with assistance    ___ Shower with assistance ?___ No alcohol     ___ Return to work/school ________ ? ?Special Instructions: ? ?No driving smoking or alcohol ? ? ? ? ? ?COMMUNITY REFERRALS UPON DISCHARGE:   ? ?Home Health:   PT  OT  SP ?               Agency: [facility]    Phone: [phone] ? ?Medical Equipment/Items Ordered:WHEELCHAIR AND DROP-ARM BEDSIDE COMMODE ?                                                Agency/Supplier:[facility]   [phone] ? ? ? ?STROKE/TIA DISCHARGE INSTRUCTIONS ?SMOKING Cigarette smoking nearly doubles your risk of having a stroke & is the single most alterable risk factor  ?If you smoke or have smoked in the last 12 months, you are advised to quit smoking for your health. Most of the excess cardiovascular risk related to smoking disappears within a year of stopping. ?Ask you doctor about anti-smoking medications ?[facility] Quit Line: 1-800-QUIT NOW ?Free Smoking Cessation Classes [phone]  ?CHOLESTEROL Know your levels; limit fat & cholesterol in your diet  ?Lipid Panel  ?   ?Component Value Date/Time  ? CHOL 184 01/13/2022 1347  ? CHOL 234 (H) 11/18/2019 1607  ? TRIG 82 01/13/2022 1347  ? HDL 53 01/13/2022 1347  ? HDL 64 11/18/2019 1607  ? CHOLHDL 3.5 01/13/2022 1347  ? VLDL 16 01/13/2022 1347  ? [place] 115 (H) 01/13/2022 1347  ? LDLCALC 153 (H) 11/18/2019 1607  ? ? ? Many  patients benefit from treatment even if their cholesterol is at goal. ?Goal: Total Cholesterol (CHOL) less than 160 ?Goal:  Triglycerides (TRIG) less than 150 ?Goal:  HDL greater than 40 ?Goal:  LDL (LDLCALC) less than 100 ?  ?BLOOD PRESSURE American Stroke Association blood pressure target is less that 120/80 mm/Hg  ?Your discharge blood pressure is:  BP: (!) 154/84 Monitor your blood pressure ?Limit your salt and alcohol intake ?Many individuals will require more than one medication for high blood pressure  ?DIABETES (A1c is a blood sugar average for last 3 months) Goal HGBA1c is under 7% (HBGA1c is blood sugar average for last 3 months)  ?Diabetes: ?  ? ?Lab Results  ?Component Value Date  ? HGBA1C 13.1 (H) 01/14/2022  ? ? Your HGBA1c can be lowered with medications, healthy diet, and exercise. ?Check your blood sugar as directed by your physician ?Call your physician if you experience unexplained or low blood sugars.  ?PHYSICAL ACTIVITY/REHABILITATION Goal is 30 minutes at least 4 days per week  ?Activity: Increase activity slowly, ?Therapies: Physical Therapy: Home Health ?Return to work:  Activity decreases your risk of heart attack and stroke and makes your heart stronger.  It helps control your weight and  blood pressure; helps you relax and can improve your mood. ?Participate in a regular exercise program. ?Talk with your doctor about the best form of exercise for you (dancing, walking, swimming, cycling).  ?DIET/WEIGHT Goal is to maintain a healthy weight  ?Your discharge diet is:  ?Diet Order   ? ?       ?  Diet heart healthy/carb modified Room service appropriate? Yes; Fluid consistency: Thin  Diet effective now       ?  ? ?  ?  ? ?  ?  liquids ?Your height is:  Height: 5' (152.4 cm) ?Your current weight is: Weight: 126.3 kg ?Your Body Mass Index (BMI) is:  BMI (Calculated): 54.38 Following the type of diet specifically designed for you will help prevent another stroke. ?Your goal weight range is:   ?Your  goal Body Mass Index (BMI) is 19-24. ?Healthy food habits can help reduce 3 risk factors for stroke:  High cholesterol, hypertension, and excess weight.  ?RESOURCES Stroke/Support Group:  Call [phone] ?  ?STROKE EDUCATION PROVIDED/REVIEWED AND GIVEN TO PATIENT Stroke warning signs and symptoms ?How to activate emergency medical system (call 911). ?Medications prescribed at discharge. ?Need for follow-up after discharge. ?Personal risk factors for stroke. ?Pneumonia vaccine given: No ?Flu vaccine given: No ?My questions have been answered, the writing is legible, and I understand these instructions.  I will adhere to these goals & educational materials that have been provided to me after my discharge from the hospital.  ? ?  ? ?My questions have been answered and I understand these instructions. I will adhere to these goals and the provided educational materials after my discharge from the hospital. ? ?Patient/Caregiver Signature _______________________________ Date __________ ? ?Clinician Signature _______________________________________ Date __________ ? ?Please bring this form and your medication list with you to all your follow-up doctor's appointments.   ? ? ?Information on my medicine - ELIQUIS? (apixaban) ? ?Why was Eliquis? prescribed for you? ?Eliquis? was prescribed to treat blood clots that may have been found in the veins of your legs (deep vein thrombosis) or in your lungs (pulmonary embolism) and to reduce the risk of them occurring again. ? ?What do You need to know about Eliquis? ? ?The dose is  ONE 5 mg tablet taken TWICE daily.  Eliquis? may be taken with or without food.  ? ?Try to take the dose about the same time in the morning and in the evening. If you have difficulty swallowing the tablet whole please discuss with your pharmacist how to take the medication safely. ? ?Take Eliquis? exactly as prescribed and DO NOT stop taking Eliquis? without talking to the doctor who prescribed the  medication.  Stopping may increase your risk of developing a new blood clot.  Refill your prescription before you run out. ? ?After discharge, you should have regular check-up appointments with your healthcare provider that is prescribing your Eliquis?. ?   ?What do you do if you miss a dose? ?If a dose of ELIQUIS? is not taken at the scheduled time, take it as soon as possible on the same day and twice-daily administration should be resumed. The dose should not be doubled to make up for a missed dose. ? ?Important Safety Information ?A possible side effect of Eliquis? is bleeding. You should call your healthcare provider right away if you experience any of the following: ?Bleeding from an injury or your nose that does not stop. ?Unusual colored urine (red or dark brown) or unusual colored stools (red  or black). ?Unusual bruising for unknown reasons. ?A serious fall or if you hit your head (even if there is no bleeding). ? ?Some medicines may interact with Eliquis? and might increase your risk of bleeding or clotting while on Eliquis?Marland Kitchen To help avoid this, consult your healthcare provider or pharmacist prior to using any new prescription or non-prescription medications, including herbals, vitamins, non-steroidal anti-inflammatory drugs (NSAIDs) and supplements. ? ?This website has more information on Eliquis? (apixaban): http://www.eliquis.com/eliquis/home  ?

## 2022-01-18 NOTE — Evaluation (Addendum)
Occupational Therapy Assessment and Plan ? ?Patient Details  ?Name: Samantha [name] ?MRN: [id number] ?Date of Birth: 03-Sep-1956 ? ?OT Diagnosis: abnormal posture, apraxia, cognitive deficits, and hemiplegia affecting dominant side ?Rehab Potential: Rehab Potential (ACUTE ONLY): Good ?ELOS: 3 weeks  ? ?Today's Date: 01/18/2022 ?OT Individual Time: [id number] ?OT Individual Time Calculation (min): 60 min    ? ?Hospital Problem: Principal Problem: ?  CVA (cerebral vascular accident) ([place]) ? ? ?Past Medical History: History reviewed. No pertinent past medical history. ?Past Surgical History:  ?Past Surgical History:  ?Procedure Laterality Date  ? TUBAL LIGATION    ? 23 yrs ago  ? ? ?Assessment & Plan ?Clinical Impression:Samantha [name] is a 66 year old right-handed female with history of obesity BMI greater than 56, hypertension hyperlipidemia diabetes mellitus type 2 as well as remote right cerebellar infarction identified on CT imaging November 2020.  Per chart review lives with spouse.  1 level home 3 steps to entry.  Independent with driving and ADLs and did not require assistive device.  Presented 01/13/2022 with right side weakness of acute onset.  Cranial CT scan showed new white matter changes on the left primarily involving the corona radiata abutting the body of the left caudate.  There was also a lacunar infarct in the left thalamus not identified on previous study.  CT angiogram head and neck occlusion versus severe stenosis of proximal right M2 MCA branch with distal reconstitution.  Severe proximal left M2 MCA stenosis.  MRI of the brain 01/14/2022 confluent acute white matter infarct of the left corona radiata.  No associated hemorrhage or mass effect.  Superimposed punctate acute on chronic right cerebellar SCA territory ischemia.  Admission chemistry sodium 134 glucose 494 BUN 25 creatinine 1.26, troponin 25, hemoglobin A1c 13.1.  Echocardiogram with ejection fraction of 55 to 60% no wall motion abnormalities.   Currently maintained on aspirin 325 mg daily and Plavix 75 mg daily for CVA prophylaxis x3 months then aspirin alone given intracranial severe stenosis.  Lovenox for DVT prophylaxis.  Tolerating a regular diet.  Patient transferred to CIR on 01/17/2022 .   ? ?Patient currently requires max with basic self-care skills secondary to muscle weakness and muscle paralysis, decreased cardiorespiratoy endurance, impaired timing and sequencing, abnormal tone, motor apraxia, decreased coordination, and decreased motor planning, decreased visual perceptual skills, right side neglect, decreased initiation, decreased attention, decreased awareness, decreased problem solving, decreased safety awareness, decreased memory, and delayed processing, and decreased sitting balance, decreased standing balance, decreased postural control, hemiplegia, and decreased balance strategies.  Prior to hospitalization, patient could complete ADLs and IADLs with independent . ? ?Patient will benefit from skilled intervention to decrease level of assist with basic self-care skills prior to discharge home with care partner.  Anticipate patient will require 24 hour supervision and minimal physical assistance and follow up home health. ? ?OT - End of Session ?Activity Tolerance: Tolerates 10 - 20 min activity with multiple rests ?Endurance Deficit: Yes ?Endurance Deficit Description: very poor sitting/standing endurance ?OT Assessment ?Rehab Potential (ACUTE ONLY): Good ?OT Barriers to Discharge: Weight ?OT Patient demonstrates impairments in the following area(s): Balance;Safety;Edema;Cognition;Sensory;Skin Integrity;Endurance;Motor ?OT Basic ADL's Functional Problem(s): Eating;Grooming;Bathing;Dressing;Toileting ?OT Transfers Functional Problem(s): Toilet;Tub/Shower ?OT Additional Impairment(s): Fuctional Use of Upper Extremity ?OT Plan ?OT Intensity: Minimum of 1-2 x/day, 45 to 90 minutes ?OT Frequency: 5 out of 7 days ?OT Duration/Estimated Length  of Stay: 3 weeks ?OT Treatment/Interventions: Balance/vestibular training;Discharge planning;Functional electrical stimulation;Pain management;Self Care/advanced ADL retraining;Therapeutic Activities;UE/LE Coordination activities;Visual/perceptual remediation/compensation;Therapeutic Exercise;Skin care/wound managment;Patient/family education;Functional mobility training;Disease  mangement/prevention;Cognitive remediation/compensation;Community Radiographer, therapeutic;Neuromuscular re-education;Psychosocial support;Splinting/orthotics;UE/LE Strength taining/ROM;Wheelchair propulsion/positioning ?OT Basic Self-Care Anticipated Outcome(s): min assist ?OT Toileting Anticipated Outcome(s): min assist ?OT Bathroom Transfers Anticipated Outcome(s): min assist ?OT Recommendation ?Patient destination: Home ?Follow Up Recommendations: 24 hour supervision/assistance;Home health OT;Outpatient OT ?Equipment Recommended: To be determined ? ? ?OT Evaluation ?Precautions/Restrictions  ?Precautions ?Precautions: Fall ?Restrictions ?Weight Bearing Restrictions: No ?General ?Chart Reviewed: Yes ?Pain ?Pain Assessment ?Pain Scale: 0-10 ?Pain Score: 0-No pain ?Home Living/Prior Functioning ?Home Living ?Family/patient expects to be discharged to:: Private residence ?Living Arrangements: Spouse/significant other, Children ?Available Help at Discharge: Family, Available 24 hours/day ?Type of Home: House ?Home Access: Stairs to enter ?Entrance Stairs-Number of Steps: 3 steps ?Entrance Stairs-Rails: Can reach both ?Home Layout: One level ?Bathroom Shower/Tub: Walk-in shower ?Bathroom Toilet: Standard ?Bathroom Accessibility: Yes ?Additional Comments: 3 steps to enter home; single level once inside ? Lives With: Spouse ?Prior Function ?Level of Independence: Independent with basic ADLs, Independent with transfers, Independent with gait, Independent with homemaking with ambulation ?Vocation: Retired ?Vision ?Baseline  Vision/History: 1 Wears glasses ?Ability to See in Adequate Light: 0 Adequate ?Patient Visual Report: No change from baseline ?Vision Assessment?: No apparent visual deficits ?Perception  ?Perception: Within Functional Limits ?Praxis ?Praxis: Intact ?Cognition ?Cognition ?Overall Cognitive Status: Impaired/Different from baseline ?Arousal/Alertness: Awake/alert ?Orientation Level: Person;Place;Situation ?Person: Oriented ?Place: Oriented ?Situation: Oriented ?Memory: Impaired ?Memory Impairment: Retrieval deficit;Decreased short term memory ?Decreased Short Term Memory: Verbal basic;Functional basic ?Attention: Focused;Sustained ?Focused Attention: Appears intact ?Sustained Attention: Appears intact ?Awareness: Impaired ?Awareness Impairment: Emergent impairment;Anticipatory impairment ?Problem Solving: Impaired ?Problem Solving Impairment: Functional basic;Verbal basic ?Executive Function: Sequencing;Organizing ?Sequencing: Impaired ?Organizing: Impaired ?Organizing Impairment: Functional basic ?Safety/Judgment: Impaired ?Brief Interview for Mental Status (BIMS) ?Repetition of Three Words (First Attempt): 3 ?Temporal Orientation: Year: Correct ?Temporal Orientation: Month: Accurate within 5 days ?Temporal Orientation: Day: Correct ?Recall: "Sock": Yes, no cue required ?Recall: "Blue": Yes, no cue required ?Recall: "Bed": Yes, no cue required ?BIMS Summary Score: 15 ?Sensation ?Sensation ?Light Touch: Appears Intact ?Hot/Cold: Appears Intact ?Proprioception: Impaired Detail ?Proprioception Impaired Details: Absent RUE;Absent RLE ?Stereognosis: Impaired by gross assessment ?Coordination ?Gross Motor Movements are Fluid and Coordinated: No ?Fine Motor Movements are Fluid and Coordinated: No ?Finger Nose Finger Test: UTA RUE; WNL LUE ?Motor  ?Motor ?Motor: Hemiplegia ?Motor - Skilled Clinical Observations: Right sided  ?Trunk/Postural Assessment  ?Cervical Assessment ?Cervical Assessment: Exceptions to [facility] (forward  head) ?Thoracic Assessment ?Thoracic Assessment: Exceptions to [facility] (rounded shoulder, depressed right shoulder) ?Lumbar Assessment ?Lumbar Assessment: Exceptions to [facility] (posterior pelvic tilt) ?Postural C

## 2022-01-18 NOTE — Progress Notes (Signed)
Inpatient Rehabilitation Center ?Individual Statement of Services ? ?Patient Name:  Samantha [name]  ?Date:  01/18/2022 ? ?Welcome to the Inpatient Rehabilitation Center.  Our goal is to provide you with an individualized program based on your diagnosis and situation, designed to meet your specific needs.  With this comprehensive rehabilitation program, you will be expected to participate in at least 3 hours of rehabilitation therapies Monday-Friday, with modified therapy programming on the weekends. ? ?Your rehabilitation program will include the following services:  Physical Therapy (PT), Occupational Therapy (OT), Speech Therapy (ST), 24 hour per day rehabilitation nursing, Neuropsychology, Care Coordinator, Rehabilitation Medicine, Nutrition Services, and Pharmacy Services ? ?Weekly team conferences will be held on Wednesday to discuss your progress.  Your Inpatient Rehabilitation Care Coordinator will talk with you frequently to get your input and to update you on team discussions.  Team conferences with you and your family in attendance may also be held. ? ?Expected length of stay: 3 weeks  Overall anticipated outcome: overall min assist level ? ?Depending on your progress and recovery, your program may change. Your Inpatient Rehabilitation Care Coordinator will coordinate services and will keep you informed of any changes. Your Inpatient Rehabilitation Care Coordinator's name and contact numbers are listed  below. ? ?The following services may also be recommended but are not provided by the Inpatient Rehabilitation Center:  ?Driving Evaluations ?Home Health Rehabiltiation Services ?Outpatient Rehabilitation Services ? ?  ?Arrangements will be made to provide these services after discharge if needed.  Arrangements include referral to agencies that provide these services. ? ?Your insurance has been verified to be:  Geneticist, molecular ?Your primary doctor is:  Dr Shella Spearing ? ?Pertinent information will be shared with  your doctor and your insurance company. ? ?Inpatient Rehabilitation Care Coordinator:  Dossie Der, LCSW [phone] or (C) [phone] ? ?Information discussed with and copy given to patient by: Lucy Chris, 01/18/2022, 10:39 AM    ?

## 2022-01-18 NOTE — Progress Notes (Signed)
Inpatient Rehabilitation Care Coordinator ?Assessment and Plan ?Patient Details  ?Name: Samantha [name] ?MRN: [id number] ?Date of Birth: [birth year] ? ?Today's Date: 01/18/2022 ? ?Hospital Problems: Active Problems: ?  CVA (cerebral vascular accident) ([place]) ? ?Past Medical History: History reviewed. No pertinent past medical history. ?Past Surgical History:  ?Past Surgical History:  ?Procedure Laterality Date  ? TUBAL LIGATION    ? 23 yrs ago  ? ?Social History:  reports that she has never smoked. She has never used smokeless tobacco. She reports that she does not drink alcohol and does not use drugs. ? ?Family / Support Systems ?Marital Status: Married ?Patient Roles: Spouse, Parent ?Spouse/Significant Other: Eddie Dibbles [id number] ?Children: Kerri-daughter [phone]  Another daughter and son in the home ?Other Supports: Friends and church members ?Anticipated Caregiver: Husband and children ?Ability/Limitations of Caregiver: Husband is retired, son is in college, one daughter getting ready to move out and other daughter is in the home ?Caregiver Availability: 24/7 ?Family Dynamics: Close family pt is usually the one in charge and makes sure everything gets done, with her in the hospital it will make it more difficult for them and roles will change. May be a stress relief for pt while here. ? ?Social History ?Preferred language: English ?Religion:  ?Cultural Background: No issues ?Education: HS ?Health Literacy - How often do you need to have someone help you when you read instructions, pamphlets, or other written material from your doctor or pharmacy?: Never ?Writes: Yes ?Employment Status: Retired ?Legal History/Current Legal Issues: No issues ?Guardian/Conservator: None-according to MD pt is capable of making her own decisions while here  ? ?Abuse/Neglect ?Abuse/Neglect Assessment Can Be Completed: Yes ?Physical Abuse: Denies ?Verbal Abuse: Denies ?Sexual Abuse: Denies ?Exploitation of patient/patient's resources:  Denies ?Self-Neglect: Denies ? ?Patient response to: ?Social Isolation - How often do you feel lonely or isolated from those around you?: Never ? ?Emotional Status ?Pt's affect, behavior and adjustment status: Pt voiced she was independent and driving prior to this stroke, she hopes to get back to this leve.l She is the one who runs the household but this also causes her stress. She seems to still be processing her stroke and the deficits she has ?Recent Psychosocial Issues: other health issues has a hx of CVA but recovered from this. ?Psychiatric History: No history feel would benefit from seeing neuro-psych while here for support and coping ?Substance Abuse History: No issues ? ?Patient / Family Perceptions, Expectations & Goals ?Pt/Family understanding of illness & functional limitations: Pt is able to explain her stroke and deficits. She is hopeful she will do well here. She does talk with the MD and feels she has a good understanding of her treatment plan moving forward. ?Premorbid pt/family roles/activities: Wife, Mom, retiree, church member, friend, etc ?Anticipated changes in roles/activities/participation: resume ?Pt/family expectations/goals: Pt states: " I hope to get my mobility back, while here and will try my best while here." ? ?Community Resources ?Community Agencies: None ?Premorbid Home Care/DME Agencies: Other (Comment) (has 3 in 1) ?Transportation available at discharge: Pt drove PTA and husband drives along with children ?Is the patient able to respond to transportation needs?: Yes ?In the past 12 months, has lack of transportation kept you from medical appointments or from getting medications?: No ?In the past 12 months, has lack of transportation kept you from meetings, work, or from getting things needed for daily living?: No ?Resource referrals recommended: Neuropsychology ? ?Discharge Planning ?Living Arrangements: Spouse/significant other, Children ?Support Systems: Spouse/significant  other, Children, Other relatives, Friends/neighbors, Church/faith  community ?Type of Residence: Private residence ?Insurance Resources: Multimedia programmer (specify) Theatre stage manager) ?Financial Resources: Fish farm manager, Family Support ?Financial Screen Referred: No ?Living Expenses: Lives with family ?Money Management: Spouse, Patient ?Does the patient have any problems obtaining your medications?: No ?Home Management: Pt now will be rest of family members ?Patient/Family Preliminary Plans: Return home with husband and children who will pull together and assist her. Aware being evaluated today and goals being set for stay here. ?Care Coordinator Barriers to Discharge: Insurance for SNF coverage, Weight ?Care Coordinator Anticipated Follow Up Needs: HH/OP ? ?Clinical Impression ?Pleasant female who is motivated to improve and recover from this stroke. She is glad to be here on rehab and will get intensive therapy here. Her family is involved and will be assisting her at discharge. Will await therapy team regarding goals and target discharge date. ? ?[name] ?01/18/2022, 10:15 AM ? ?  ?

## 2022-01-18 NOTE — Plan of Care (Signed)
?  Problem: RH Eating ?Goal: LTG Patient will perform eating w/assist, cues/equip (OT) ?Description: LTG: Patient will perform eating with assist, with/without cues using equipment (OT) ?Flowsheets (Taken 01/18/2022 1241) ?LTG: Pt will perform eating with assistance level of: Set up assist  ?  ?Problem: RH Grooming ?Goal: LTG Patient will perform grooming w/assist,cues/equip (OT) ?Description: LTG: Patient will perform grooming with assist, with/without cues using equipment (OT) ?Flowsheets (Taken 01/18/2022 1241) ?LTG: Pt will perform grooming with assistance level of: Set up assist  ?  ?Problem: RH Bathing ?Goal: LTG Patient will bathe all body parts with assist levels (OT) ?Description: LTG: Patient will bathe all body parts with assist levels (OT) ?Flowsheets (Taken 01/18/2022 1241) ?LTG: Pt will perform bathing with assistance level/cueing: Minimal Assistance - Patient > 75% ?LTG: Position pt will perform bathing: At sink ?  ?Problem: RH Dressing ?Goal: LTG Patient will perform upper body dressing (OT) ?Description: LTG Patient will perform upper body dressing with assist, with/without cues (OT). ?Flowsheets (Taken 01/18/2022 1241) ?LTG: Pt will perform upper body dressing with assistance level of: Minimal Assistance - Patient > 75% ?Goal: LTG Patient will perform lower body dressing w/assist (OT) ?Description: LTG: Patient will perform lower body dressing with assist, with/without cues in positioning using equipment (OT) ?Flowsheets (Taken 01/18/2022 1241) ?LTG: Pt will perform lower body dressing with assistance level of: Minimal Assistance - Patient > 75% ?  ?Problem: RH Toileting ?Goal: LTG Patient will perform toileting task (3/3 steps) with assistance level (OT) ?Description: LTG: Patient will perform toileting task (3/3 steps) with assistance level (OT)  ?Flowsheets (Taken 01/18/2022 1241) ?LTG: Pt will perform toileting task (3/3 steps) with assistance level: Minimal Assistance - Patient > 75% ?  ?Problem: RH  Toilet Transfers ?Goal: LTG Patient will perform toilet transfers w/assist (OT) ?Description: LTG: Patient will perform toilet transfers with assist, with/without cues using equipment (OT) ?Flowsheets (Taken 01/18/2022 1241) ?LTG: Pt will perform toilet transfers with assistance level of: Minimal Assistance - Patient > 75% ?  ?

## 2022-01-18 NOTE — Evaluation (Signed)
Speech Language Pathology Assessment and Plan ? ?Patient Details  ?Name: Samantha [name] ?MRN: [id number] ?Date of Birth: April 07, 1956 ? ?SLP Diagnosis: Cognitive Impairments;Dysarthria  ?Rehab Potential: Excellent ?ELOS: 3 weeks  ? ? ?Today's Date: 01/18/2022 ?SLP Individual Time: 4098-1191 ?SLP Individual Time Calculation (min): 55 min ? ? ?Hospital Problem: Principal Problem: ?  CVA (cerebral vascular accident) ([place]) ? ?Past Medical History: History reviewed. No pertinent past medical history. ?Past Surgical History:  ?Past Surgical History:  ?Procedure Laterality Date  ? TUBAL LIGATION    ? 23 yrs ago  ? ? ?Assessment / Plan / Recommendation ?Clinical Impression Patient is a 66 year old right-handed female with history of obesity BMI greater than 56, hypertension, hyperlipidemia, diabetes mellitus type 2, as well as remote right cerebellar infarction identified on CT imaging November 2020.   Presented 01/13/2022 with right side weakness of acute onset.  Cranial CT scan showed new white matter changes on the left primarily involving the corona radiata abutting the body of the left caudate.  There was also a lacunar infarct in the left thalamus not identified on previous study.  MRI of the brain 01/14/2022 confluent acute white matter infarct of the left corona radiata.  No associated hemorrhage or mass effect.  Superimposed punctate acute on chronic right cerebellar SCA territory ischemia.  Tolerating a regular diet.  Therapy evaluations completed with recommendations for CIR. Patient admitted 01/17/22. ? ?Patient was administered a cognitive-linguistic evaluation. Patient demonstrates mild dysarthria characterized by a low vocal intensity and imprecise consonants resulting in decreased speech intelligibility at the sentence level. Patient was administered the Cognistat and demonstrates mild deficits in problem solving and moderate impairments in short-term recall. Patient also demonstrated decreased attention to right  field of body/environment during functional tasks. Patient would benefit from continued skilled SLP intervention to maximize her cognitive functioning and speech intelligibility prior to discharge.  ?  ?Skilled Therapeutic Interventions          Administered a cognitive-linguistic evaluation, please see above for details.   ?SLP Assessment ? Patient will need skilled Speech Lanaguage Pathology Services during CIR admission  ?  ?Recommendations ? Oral Care Recommendations: Oral care BID ?Patient destination: Home ?Follow up Recommendations: 24 hour supervision/assistance;Home Health SLP;Outpatient SLP ?Equipment Recommended: None recommended by SLP  ?  ?SLP Frequency 3 to 5 out of 7 days;1 to 3 out of 7 days   ?SLP Duration ? ?SLP Intensity ? ?SLP Treatment/Interventions 3 weeks ? ?Minumum of 1-2 x/day, 30 to 90 minutes ? ?Cognitive remediation/compensation;Internal/external aids;Cueing hierarchy;Medication managment;Therapeutic Activities;Functional tasks;Patient/family education   ? ?Pain ?Pain Assessment ?Pain Scale: 0-10 ?Pain Score: 0-No pain ? ?Prior Functioning ?Type of Home: House ? Lives With: Spouse ?Available Help at Discharge: Family;Available 24 hours/day ?Vocation: Retired ? ?SLP Evaluation ?Cognition ?Overall Cognitive Status: Impaired/Different from baseline ?Arousal/Alertness: Awake/alert ?Orientation Level: Oriented X4 ?Attention: Focused;Sustained ?Focused Attention: Appears intact ?Sustained Attention: Appears intact ?Memory: Impaired ?Memory Impairment: Retrieval deficit;Decreased short term memory ?Decreased Short Term Memory: Verbal basic;Functional basic ?Awareness: Impaired ?Awareness Impairment: Emergent impairment ?Problem Solving: Impaired ?Problem Solving Impairment: Functional basic;Verbal basic ?Executive Function: Sequencing;Organizing ?Sequencing: Impaired ?Organizing: Impaired ?Organizing Impairment: Functional basic ?Safety/Judgment: Impaired  ?Comprehension ?Auditory  Comprehension ?Overall Auditory Comprehension: Appears within functional limits for tasks assessed ?Expression ?Expression ?Primary Mode of Expression: Verbal ?Verbal Expression ?Overall Verbal Expression: Appears within functional limits for tasks assessed ?Written Expression ?Dominant Hand: Right ?Written Expression: Not tested ?Oral Motor ?Oral Motor/Sensory Function ?Overall Oral Motor/Sensory Function: Mild impairment ?Facial ROM: Reduced right ?Facial Symmetry: Abnormal symmetry  right ?Facial Strength: Reduced right ?Facial Sensation: Within Functional Limits ?Lingual ROM: Reduced right;Suspected CN XII (hypoglossal) dysfunction ?Lingual Symmetry: Abnormal symmetry right;Suspected CN XII (hypoglossal) dysfunction ?Lingual Strength: Reduced;Suspected CN XII (hypoglossal) dysfunction ?Lingual Sensation: Within Functional Limits ?Velum: Within Functional Limits ?Mandible: Within Functional Limits ?Motor Speech ?Overall Motor Speech: Impaired ?Respiration: Within functional limits ?Phonation: Low vocal intensity ?Resonance: Within functional limits ?Articulation: Impaired ?Intelligibility: Intelligibility reduced ?Word: 75-100% accurate ?Phrase: 75-100% accurate ?Sentence: 75-100% accurate ?Conversation: 75-100% accurate ?Motor Planning: Witnin functional limits ?Effective Techniques: Increased vocal intensity;Slow rate;Over-articulate ? ?Care Tool ?Care Tool Cognition ?Ability to hear (with hearing aid or hearing appliances if normally used Ability to hear (with hearing aid or hearing appliances if normally used): 1. Minimal difficulty - difficulty in some environments (e.g. when person speaks softly or setting is noisy) ?  ?Expression of Ideas and Wants Expression of Ideas and Wants: 3. Some difficulty - exhibits some difficulty with expressing needs and ideas (e.g, some words or finishing thoughts) or speech is not clear ?  ?Understanding Verbal and Non-Verbal Content Understanding Verbal and Non-Verbal  Content: 3. Usually understands - understands most conversations, but misses some part/intent of message. Requires cues at times to understand  ?Memory/Recall Ability Memory/Recall Ability : Current season;That he or she is in a hospital/hospital unit  ? ? ?Short Term Goals: ?Week 1: SLP Short Term Goal 1 (Week 1): Patient will recall new, daily information with use of external aids with Min verbal cues. ?SLP Short Term Goal 2 (Week 1): Patient will demosntrate functional problem solving for basic and familiar tasks with supervision level verbal cues. ?SLP Short Term Goal 3 (Week 1): Patient will attend to right field of enviornment/body during functional tasks with Min verbal cues. ?SLP Short Term Goal 4 (Week 1): Patient will maximize speech intelligibility to 90% at the sentence level with Min verbal cues for use speech intelligibility strategies. ? ?Refer to Care Plan for Long Term Goals ? ?Recommendations for other services: Neuropsych ? ?Discharge Criteria: Patient will be discharged from SLP if patient refuses treatment 3 consecutive times without medical reason, if treatment goals not met, if there is a change in medical status, if patient makes no progress towards goals or if patient is discharged from hospital. ? ?The above assessment, treatment plan, treatment alternatives and goals were discussed and mutually agreed upon: by patient ? ?Airam Heidecker ?01/18/2022, 3:28 PM ? ? ?

## 2022-01-18 NOTE — Plan of Care (Signed)
?  Problem: RH Expression Communication ?Goal: LTG Patient will increase speech intelligibility (SLP) ?Description: LTG: Patient will increase speech intelligibility at word/phrase/conversation level with cues, % of the time (SLP) ?Flowsheets (Taken 01/18/2022 1532) ?LTG: Patient will increase speech intelligibility (SLP): Modified Independent ?Level: Conversation level ?  ?Problem: RH Problem Solving ?Goal: LTG Patient will demonstrate problem solving for (SLP) ?Description: LTG:  Patient will demonstrate problem solving for basic/complex daily situations with cues  (SLP) ?Flowsheets (Taken 01/18/2022 1532) ?LTG: Patient will demonstrate problem solving for (SLP): Complex daily situations ?LTG Patient will demonstrate problem solving for: Minimal Assistance - Patient > 75% ?  ?Problem: RH Memory ?Goal: LTG Patient will demonstrate ability for day to day (SLP) ?Description: LTG:   Patient will demonstrate ability for day to day recall/carryover during cognitive/linguistic activities with assist  (SLP) ?Flowsheets (Taken 01/18/2022 1532) ?LTG: Patient will demonstrate ability for day to day recall/carryover during cognitive/linguistic activities with assist (SLP): Supervision ?Goal: LTG Patient will use memory compensatory aids to (SLP) ?Description: LTG:  Patient will use memory compensatory aids to recall biographical/new, daily complex information with cues (SLP) ?Flowsheets (Taken 01/18/2022 1532) ?LTG: Patient will use memory compensatory aids to (SLP): Supervision ?  ?

## 2022-01-19 LAB — GLUCOSE, CAPILLARY
Glucose-Capillary: 120 mg/dL — ABNORMAL HIGH (ref 70–99)
Glucose-Capillary: 151 mg/dL — ABNORMAL HIGH (ref 70–99)
Glucose-Capillary: 100 mg/dL — ABNORMAL HIGH (ref 70–99)
Glucose-Capillary: 197 mg/dL — ABNORMAL HIGH (ref 70–99)
Glucose-Capillary: 114 mg/dL — ABNORMAL HIGH (ref 70–99)
Glucose-Capillary: 63 mg/dL — ABNORMAL LOW (ref 70–99)
Glucose-Capillary: 111 mg/dL — ABNORMAL HIGH (ref 70–99)

## 2022-01-19 MED ORDER — HYDROCORTISONE 1 % EX CREA
TOPICAL_CREAM | Freq: Four times a day (QID) | CUTANEOUS | Status: DC
  Administered 2022-01-19: 1 via TOPICAL
  Filled 2022-01-19: qty 28

## 2022-01-19 MED ORDER — MELATONIN 3 MG PO TABS
3.0000 mg | ORAL_TABLET | Freq: Every evening | ORAL | Status: DC | PRN
  Administered 2022-01-30 – 2022-02-04 (×4): 3 mg via ORAL
  Filled 2022-01-19 (×5): qty 1

## 2022-01-19 NOTE — Progress Notes (Signed)
? 01/19/22 1045  ?Clinical Encounter Type  ?Visited With Health care provider;Patient and family together ?(Daughter - Asheley Hellberg)  ?Visit Type Initial ?(Advance Directive Education)  ?Referral From Nurse ?Ladona Ridgel B. Clovis Riley, LPN)  ?Consult/Referral To Chaplain  ?Advance Directives (For Healthcare)  ?Does Patient Have a Medical Advance Directive? No  ?Would patient like information on creating a medical advance directive? Yes (Inpatient - patient defers creating a medical advance directive at this time - Information given)  ?Mental Health Advance Directives  ?Does Patient Have a Mental Health Advance Directive? No  ?Would patient like information on creating a mental health advance directive? Yes (Inpatient - patient defers creating a mental health advance directive at this time - Information given)  ? ?Chaplain responded to a consult request for Advance Directive education.  ? ?Chaplain provided the Advance Directive packet as well as education on Advance Directives-documents an individual completes to communicate their health care directions in advance of a time when they may need them to Ms. Shanae Baggerly and her daughter, Latorsha Curling. Chaplain informed Ms. Newson the documents which may be completed here in the hospital are the Living Will and Health Care Power of Attorney.  ? ?Chaplain informed that the Health Care Power of Gerrit Friends is a legal document in which an individual names another person, their Health Care Agent, to make health care decisions when the individual is not able to make them for themselves. The Health Care Agent's function can be temporary or permanent depending on the her ability to make and communicate those decisions independently. Chaplain informed Ms. Bowland in the absence of a Health Care Power of Gerrit Friends, the state of [place] directs health care providers to look to the following individuals in the order listed: legal guardian; an attorney?in?fact under a general  power of attorney (POA) if that POA includes the right to make health care decisions; her husband, Mr. Anneka Studer; a majority of her daughter, Ms. Keyaria Lawson; a majority of adult brothers and sisters; or an individual who has an established relationship with you, who is acting in good faith and who can convey your wishes.  If none of these person are available or willing to make medical decisions on a patient's behalf, the law allows the patient's doctor to make decisions for them as long as another doctor agrees with those decisions.  Chaplain also informed the patient that the Health Care agent has no decision-making authority over any affairs other than those related to his or her medical care.  ? ?The chaplain further educated the Ms. Merkle that a Living Will is a legal document that allows her desire not to receive life-prolonging measures in the event that they have a condition that is incurable and will result in their death in a short period of time; they are unconscious, and doctors are confident that they will not regain consciousness; and/or they have advanced dementia or other substantial and irreversible loss of mental function. The chaplain informed Ms. Canto that life-prolonging measures are medical treatments that would only serve to postpone death, including breathing machines, kidney dialysis, antibiotics, artificial nutrition and hydration (tube feeding), and similar forms of treatment and that if an individual is able to express their wishes, they may also make them known without the use of a Living Will, but in the event that she is not able to express her wishes, a Living Will allows medical providers and the her family and friends ensure that they are not making decisions on the  her behalf, but rather serving as the pt's voice to convey decisions the pt has already made.  ? ?The patient is aware that the decision to create an advance directive is theirs alone and they may chose not to  complete the documents or may chose to complete one portion or both.  Ms. Bennetts was informed that she can revoke the documents at any time by striking through them and writing void or by completing new documents, but that it is also advisable that the individual verbally notify interested parties that their wishes have changed. ? ?Ms. Zurita is also aware that the document must be signed in the presence of a notary public and two witnesses and that this can be done while the patient is still admitted to the hospital or after discharge in the community. If they decide to complete Advance Directives after being discharged from the hospital, they have been advised to notify all interested parties and to provide those documents to their physicians and loved ones in addition to bringing them to the hospital in the event of another hospitalization.  ? ?The chaplain informed the Ms. Laminack that if she desires to proceed with completing Advance Directive Documentation while she is still admitted, notary services are typically available at [facility] between the hours of 1:00 and 3:30 Monday-Thursday.   ? ?When the patient is ready to have these documents completed, the patient should request that their nurse place a spiritual care consult and indicate that the patient is ready to have their advance directives notarized so that arrangements for witnesses and notary public can be made. ? ?Please page spiritual care if the patient desires further education or has questions.    ? ?Clent Ridges., [phone]  ?  ?

## 2022-01-19 NOTE — Progress Notes (Signed)
Pt CBG was 151 at 20:48. Pt needs are met at this time, will continue to monitor per orders. ?

## 2022-01-19 NOTE — Progress Notes (Signed)
Speech Language Pathology Daily Session Note ? ?Patient Details  ?Name: Samantha [name] ?MRN: [id number] ?Date of Birth: December 11, 1955 ? ?Today's Date: 01/19/2022 ?SLP Individual Time: 1345-1430 ?SLP Individual Time Calculation (min): 45 min ? ?Short Term Goals: ?Week 1: SLP Short Term Goal 1 (Week 1): Patient will recall new, daily information with use of external aids with Min verbal cues. ?SLP Short Term Goal 2 (Week 1): Patient will demosntrate functional problem solving for basic and familiar tasks with supervision level verbal cues. ?SLP Short Term Goal 3 (Week 1): Patient will attend to right field of enviornment/body during functional tasks with Min verbal cues. ?SLP Short Term Goal 4 (Week 1): Patient will maximize speech intelligibility to 90% at the sentence level with Min verbal cues for use speech intelligibility strategies. ? ?Skilled Therapeutic Interventions: Skilled ST treatment focused on speech and cognitive goals. SLP facilitated session by providing education on SLOP speech strategies in which patient implemented at the sentence level with sup A to achieve 90% intelligibility. SLP also facilitated functional problem solving and verbal reasoning task by generating familiar names for each letter of the alphabet. Pt's daughter was present and helpful with providing semantic cues following SLP's instruction for recall of important friend and family member's names. Pt required min a for alternating attention with each letter, min A visual cues for working memory w/ external aid, and min A for functional recall. Patient appeared to enjoy the challenge and stated "that feels good for my brain." Patient was left in bed with alarm activated and immediate needs within reach at end of session. Continue per current plan of care.   ?   ? ?Pain ?Pain Assessment ?Pain Scale: 0-10 ?Pain Score: 0-No pain ? ?Therapy/Group: Individual Therapy ? ?Zykee Avakian T Gladyce Mcray ?01/19/2022, 4:34 PM ?

## 2022-01-19 NOTE — Progress Notes (Signed)
Physical Therapy Session Note ? ?Patient Details  ?Name: Samantha [name] ?MRN: [id number] ?Date of Birth: 05-13-1956 ? ?Today's Date: 01/19/2022 ?PT Individual Time: 0800-0900 ?PT Individual Time Calculation (min): 60 min  ? ?Short Term Goals: ?Week 1:  PT Short Term Goal 1 (Week 1): Pt will complete bed mobility with maxA of 1 person ?PT Short Term Goal 2 (Week 1): Pt will complete bed<>chair transfer with maxA and LRAD ?PT Short Term Goal 3 (Week 1): Pt will tolerate sitting in w/c >2 hours outside of therapy sessions ?PT Short Term Goal 4 (Week 1): pt will ambulate 33ft with maxA and LRAD ? ?Skilled Therapeutic Interventions/Progress Updates: Pt presents semi-reclined in bed and beginning breakfast.  Nursing present giving meds.  Pt agreeable to therapy, agreeing to change and sit in w/c to finish breakfast.  Pt rolled side to side in bed w/ max A to doff soiled brief of urine and total A for pericare and don clean brief.  Pt required max A to thread pants over feet and able to bridge on Left for assistance to bring pants over hips.  Pt required x 5 stretches to R LE and then assist to maintain hooklying position on Right, but unable to clear hips.  Pt rolled to Left for Total A to pull pants over hips on Right.  Family member donned bra for patient.  Pt transferred sup to sit w/ initial log roll to Right but then required max A for completion of sup to sit.  Pt able to maintain sitting at EOB w/ CGA to supervision w/o UE support.  R UE threaded through pull-over shirt and then min A for completion of UB dressing.  Bed elevated for transfer sit to stand to [facility] w/ max A.  Pt able to maintain standing balance once fully upright.  Pt transferred to w/c w/ Stedy, maintaining midline stance.  Pt finished breakfast and education given for maintaining position of RUE.  R arm trough applied to w/c.  Pt remained sitting in w/c w/ family member present, all needs in reach.  Nursing to bring chair alarm for use in w/c. ?    ? ?Therapy Documentation ?Precautions:  ?Precautions ?Precautions: Fall ?Precaution Comments: R hemi (RUE > RLE) ?Restrictions ?Weight Bearing Restrictions: No ?General: ?  ?Vital Signs: ?Therapy Vitals ?Temp: 98.2 ?F (36.8 ?C) ?Pulse Rate: 83 ?Resp: 18 ?BP: (!) 150/68 ?Patient Position (if appropriate): Lying ?Oxygen Therapy ?SpO2: 99 % ?O2 Device: Room Air ?Pain:0/10 ?  ? ? ? ?Therapy/Group: Individual Therapy ? ?Lucio Edward ?01/19/2022, 3:36 PM  ?

## 2022-01-19 NOTE — Progress Notes (Signed)
Occupational Therapy Session Note ? ?Patient Details  ?Name: Samantha [name] ?MRN: [id number] ?Date of Birth: 10-30-1955 ? ?Today's Date: 01/19/2022 ?OT Individual Time: 1191-4782 ?OT Individual Time Calculation (min): 60 min  ? ? ?Short Term Goals: ?Week 1:  OT Short Term Goal 1 (Week 1): Pt will complete donning/doffing shirt with mod assist ?OT Short Term Goal 2 (Week 1): Pt will donn/doff pants with mod assist ?OT Short Term Goal 3 (Week 1): Pt will complete stand pivot with mod assist ?OT Short Term Goal 4 (Week 1): Pt will complete toileting with max assist at [facility] level. ? ?Skilled Therapeutic Interventions/Progress Updates:  ?  Pt sitting up in w/c, dtr present and very pleasant and encouraging throughout session.  Pt transported to gym and completed neuro re-ed of RUE shoulder to digits.  Mirror positioned in front of pt for visual feedback for motor relearning.  Pt completed the following: shoulder shrugs, retraction, ER/IR, FF/ext, elbow flex/ext, wrist ext, digital extension.  Pt required manual support to position in gravity eliminated plane and to facilitate AAROM to complete full movement.  Pt also required targets to promote external focus and verbal cues to encourage visualization and motor planning.  Pt exhibited trace to 1+ in shoulder and elbow but 0/5 in forearm and wrist/digits therefore utilized attended NMES on settings 35pps, 284 pulse width, 2 sec ramp, 1:2 ratio on/off time and combined with functional reach/grasp/ and place for FES purposes with manual support provided by therapist AAROM.  Applied Ktape to wrist and digits to facilitory activation.  Transported back to room, pt left up in chair, dtr present, call bell in reach, seat alarm on, RUE supported by arm trough. ? ?Therapy Documentation ?Precautions:  ?Precautions ?Precautions: Fall ?Precaution Comments: R hemi (RUE > RLE) ?Restrictions ?Weight Bearing Restrictions: No ? ? ? ?Therapy/Group: Individual Therapy ? ?[name]  [name] ?01/19/2022, 3:17 PM ?

## 2022-01-19 NOTE — Progress Notes (Signed)
?                                                       PROGRESS NOTE ? ? ?Subjective/Complaints: ?Complains of rash on back of legs- hypoallergenic sheets and hydrocortisone cream ordered ?Sleeping better at night ? ?ROS: insomnia resolved, +rash posterior legs ? ? ?Objective: ?  ?No results found. ?Recent Labs  ?  01/17/22 ?2146 01/18/22 ?0542  ?WBC 8.8 8.2  ?HGB 11.3* 11.5*  ?HCT 36.9 35.6*  ?PLT 155 169  ? ?Recent Labs  ?  01/17/22 ?0345 01/18/22 ?0542  ?NA 137 140  ?K 4.4 4.2  ?CL 105 108  ?CO2 25 26  ?GLUCOSE 174* 115*  ?BUN 27* 24*  ?CREATININE 1.06* 1.01*  ?CALCIUM 8.9 9.1  ? ? ?Intake/Output Summary (Last 24 hours) at 01/19/2022 1348 ?Last data filed at 01/19/2022 0900 ?Gross per 24 hour  ?Intake 480 ml  ?Output --  ?Net 480 ml  ?  ? ?  ? ?Physical Exam: ?Vital Signs ?Blood pressure (!) 175/74, pulse 73, temperature 98.2 ?F (36.8 ?C), temperature source Oral, resp. rate 18, height 5' (1.524 m), weight 126.3 kg, SpO2 100 %. ?Gen: no distress, normal appearing, bMI 54.38 ?HEENT: oral mucosa pink and moist, NCAT ?Cardio: Reg rate ?Chest: normal effort, normal rate of breathing ?Abd: soft, non-distended ?Ext: no edema ?Psych: pleasant, normal affect ?Skin: intact ?Neurological:  ?   Comments: Patient is alert.  No acute distress.  Makes eye contact with examiner.  Oriented to person and place.  Fair awareness of deficits. RUE 0/5, RLE 2/5, Left side 5/5. Sensation intact. ? ? ?Assessment/Plan: ?1. Functional deficits which require 3+ hours per day of interdisciplinary therapy in a comprehensive inpatient rehab setting. ?Physiatrist is providing close team supervision and 24 hour management of active medical problems listed below. ?Physiatrist and rehab team continue to assess barriers to discharge/monitor patient progress toward functional and medical goals ? ?Care Tool: ? ?Bathing ?   ?Body parts bathed by patient: Buttocks, Front perineal area, Right upper leg, Left upper leg  ?   ?  ?  ?Bathing assist Assist  Level: Total Assistance - Patient < 25% ?  ?  ?Upper Body Dressing/Undressing ?Upper body dressing   ?  ?   ?Upper body assist Assist Level: Total Assistance - Patient < 25% ?   ?Lower Body Dressing/Undressing ?Lower body dressing ? ? ?   ?  ? ?  ? ?Lower body assist Assist for lower body dressing: Total Assistance - Patient < 25% ?   ? ?Toileting ?Toileting    ?Toileting assist Assist for toileting: 2 Helpers ?  ?  ?Transfers ?Chair/bed transfer ? ?Transfers assist ? Chair/bed transfer activity did not occur: Safety/medical concerns ? ?Chair/bed transfer assist level: Dependent - mechanical lift (stedy) ?  ?  ?Locomotion ?Ambulation ? ? ?Ambulation assist ? ? Ambulation activity did not occur: Safety/medical concerns ? ?  ?  ?   ? ?Walk 10 feet activity ? ? ?Assist ? Walk 10 feet activity did not occur: Safety/medical concerns ? ?  ?   ? ?Walk 50 feet activity ? ? ?Assist Walk 50 feet with 2 turns activity did not occur: Safety/medical concerns ? ?  ?   ? ? ?Walk 150 feet activity ? ? ?Assist Walk 150 feet activity did not  occur: Safety/medical concerns ? ?  ?  ?  ? ?Walk 10 feet on uneven surface  ?activity ? ? ?Assist Walk 10 feet on uneven surfaces activity did not occur: Safety/medical concerns ? ? ?  ?   ? ?Wheelchair ? ? ? ? ?Assist Is the patient using a wheelchair?: Yes ?Type of Wheelchair: Manual ?  ? ?Wheelchair assist level: Dependent - Patient 0% ?Max wheelchair distance: 0  ? ? ?Wheelchair 50 feet with 2 turns activity ? ? ? ?Assist ? ?  ?  ? ? ?Assist Level: Dependent - Patient 0%  ? ?Wheelchair 150 feet activity  ? ? ? ?Assist ?   ? ? ?Assist Level: Dependent - Patient 0%  ? ?Blood pressure (!) 175/74, pulse 73, temperature 98.2 ?F (36.8 ?C), temperature source Oral, resp. rate 18, height 5' (1.524 m), weight 126.3 kg, SpO2 100 %. ? ?Medical Problem List and Plan: ?1. Functional deficits secondary to left CR confluent infarct along with punctate left BG, thalamus and right cerebellum infarct as well  as history of remote right cerebellar infarct identified on CT imaging November 2020 ?            -patient may shower ?            -ELOS/Goals: 14-21 days MinA PT, S OT, S SLP ?            -HFU scheduled ?2.  Antithrombotics: ?-DVT/anticoagulation:  Pharmaceutical: Lovenox ?            -antiplatelet therapy: Aspirin 325 mg daily and Plavix 75 mg daily x3 months then aspirin alone ?3. Pain Management: Tylenol as needed ?4. Mood: Provide emotional support ?            -antipsychotic agents: N/A ?5. Neuropsych: This patient is capable of making decisions on her own behalf. ?6. Skin/Wound Care: Routine skin checks ?7. Fluids/Electrolytes/Nutrition: Routine in and outs with follow-up chemistries ?8.  Diabetes mellitus.  Hemoglobin A1c 13.1.  Insulin therapy as directed with diabetic teaching. Educated regarding avoiding foods with added sugar.  ?9.  Hypertension.  Norvasc 10 mg daily.  Monitor with increased mobility. Add magnesium gluconate 250mg  HS.  ?10.  Hyperlipidemia.  Lipitor ?11.  Morbid obesity.  BMI greater than 56.  Dietary follow-up ?12. Vitamin D deficiency: start ergocalciferol 50,000U once per week for 7 weeks ?13. Insomnia: resolved with melatonin 3mg  HS, will change to prn ?14. AKI: improving, place nursing order to encourage 6-8 glasses of water per day, repeat creatinine ordered for Monday. ?15. Rash on posterior legs: hypoallergenic sheets and hydrocortisone cream ordered  ? ? ?LOS: ?2 days ?A FACE TO FACE EVALUATION WAS PERFORMED ? ? P Falon Flinchum ?01/19/2022, 1:48 PM  ? ?  ?

## 2022-01-20 DIAGNOSIS — N179 Acute kidney failure, unspecified: Secondary | ICD-10-CM

## 2022-01-20 DIAGNOSIS — E669 Obesity, unspecified: Secondary | ICD-10-CM

## 2022-01-20 DIAGNOSIS — E1169 Type 2 diabetes mellitus with other specified complication: Secondary | ICD-10-CM

## 2022-01-20 DIAGNOSIS — I10 Essential (primary) hypertension: Secondary | ICD-10-CM

## 2022-01-20 DIAGNOSIS — I63513 Cerebral infarction due to unspecified occlusion or stenosis of bilateral middle cerebral arteries: Secondary | ICD-10-CM

## 2022-01-20 LAB — GLUCOSE, CAPILLARY
Glucose-Capillary: 109 mg/dL — ABNORMAL HIGH (ref 70–99)
Glucose-Capillary: 231 mg/dL — ABNORMAL HIGH (ref 70–99)
Glucose-Capillary: 250 mg/dL — ABNORMAL HIGH (ref 70–99)
Glucose-Capillary: 138 mg/dL — ABNORMAL HIGH (ref 70–99)

## 2022-01-20 NOTE — Progress Notes (Signed)
Occupational Therapy Session Note ? ?Patient Details  ?Name: Samantha [name] ?MRN: [id number] ?Date of Birth: 1956-08-19 ? ?Today's Date: 01/20/2022 ?OT Individual Time: [id number] ?OT Individual Time Calculation (min): 71 min  ? ? ?Short Term Goals: ?Week 1:  OT Short Term Goal 1 (Week 1): Pt will complete donning/doffing shirt with mod assist ?OT Short Term Goal 2 (Week 1): Pt will donn/doff pants with mod assist ?OT Short Term Goal 3 (Week 1): Pt will complete stand pivot with mod assist ?OT Short Term Goal 4 (Week 1): Pt will complete toileting with max assist at [facility] level. ? ?Skilled Therapeutic Interventions/Progress Updates:  ?  Pt in wheelchair to start session.  Had her complete oral hygiene with setup of supplies as she had stated she hasn't completed this since admission.  She was able to integrate just the left hand for task after therapist applied toothpaste and presented her with water in a cup.  After completion of grooming task had her complete sit to stand in the [place] with overall max assist.  Slight increased tone noted in the RUE at the elbow and digits, with max hand over hand for placement of the RUE on the Stedy bar.  Next, had her progress to standing with the RW, again at overall total assist +2 (pt 40%) level with increased fear noted.  Attempted to integrate right hand splint on the walker, however pt flexing trunk too far forward and with increased fear and low endurance, she sat down before therapist could position the hand correctly.  Left hand splint on walker for future trails as therapist's see fit.  Took her down to the therapy gym with transfer to the therapy mat at max assist level squat pivot.  Worked on sit to stand transitions with mirror in front of her at total +2 (pt 40%) and with the RUE positioned over therapist's shoulder.  She was only able to tolerate standing for approximately one minute with dyspnea increasing to 3/4 and pt having to sit down.  Oxygen sats at 94% on room  air with HR increasing up to the 122 range.  Completed several short intervals of standing with work on letting go with the LUE from therapy tech's hand and picking up and placing clothespins.  Progressed to having her flex her knees slightly to reach down and place clothespins to the left using the LUE.  Finished work in [employer] on [employer] activation using tilted stool.  She was able to initiate and complete small movements of elbow extension with shoulder flexion with mod demonstrational cueing to avoid overcompensation with her trunk.  Finished session with max assist squat pivot transfer back to the bed with pt left sitting up per request for lunch.  Call button and phone in reach with safety alarm belt in place.  Pt's daughter in the room as well.   Discussed session briefly with her.  ? ?Therapy Documentation ?Precautions:  ?Precautions ?Precautions: Fall ?Precaution Comments: R hemi (RUE > RLE) ?Restrictions ?Weight Bearing Restrictions: No ?  ?Pain: ?Pain Assessment ?Pain Scale: Faces ?Pain Score: 0-No pain ?ADL: ?See Care Tool Section for some details of mobility and selfcare ? ? ?Therapy/Group: Individual Therapy ? ?Mckenzi Buonomo OTR/L ?01/20/2022, 12:34 PM ?

## 2022-01-20 NOTE — IPOC Note (Signed)
Overall Plan of Care (IPOC) ?Patient Details ?Name: Samantha [name] ?MRN: [id number] ?DOB: 09/23/1956 ? ?Admitting Diagnosis: CVA (cerebral vascular accident) ([place]) ? ?Hospital Problems: Principal Problem: ?  CVA (cerebral vascular accident) ([place]) ? ? ? ? Functional Problem List: ?Nursing Medication Management, Safety, Endurance, Bowel  ?PT Balance, Endurance, Motor, Safety, Skin Integrity  ?OT Balance, Safety, Edema, Cognition, Sensory, Skin Integrity, Endurance, Motor  ?SLP Cognition, Linguistic  ?TR    ?    ? Basic ADL?s: ?OT Eating, Grooming, Bathing, Dressing, Toileting  ? ?  Advanced  ADL?s: ?OT    ?   ?Transfers: ?PT Bed Mobility, Bed to Chair, Car  ?OT Toilet, Tub/Shower  ? ?  Locomotion: ?PT Ambulation, Stairs, Wheelchair Mobility  ? ?  Additional Impairments: ?OT Fuctional Use of Upper Extremity  ?SLP Communication, Social Cognition ?expression ?Problem Solving, Memory, Awareness  ?TR    ? ? ?Anticipated Outcomes ?Item Anticipated Outcome  ?Self Feeding    ?Swallowing ?   ?  ?Basic self-care ? min assist  ?Toileting ? min assist ?  ?Bathroom Transfers min assist  ?Bowel/Bladder ? manage bowel w mod I assist  ?Transfers ? minA with LRAD  ?Locomotion ? minA with LRAD  ?Communication ?    ?Cognition ? Supervision  ?Pain ? n/a  ?Safety/Judgment ? maintain w cues  ? ?Therapy Plan: ?PT Intensity: Minimum of 1-2 x/day ,45 to 90 minutes ?PT Frequency: 5 out of 7 days ?PT Duration Estimated Length of Stay: 3 weeks ?OT Intensity: Minimum of 1-2 x/day, 45 to 90 minutes ?OT Frequency: 5 out of 7 days ?OT Duration/Estimated Length of Stay: 3 weeks ?SLP Intensity: Minumum of 1-2 x/day, 30 to 90 minutes ?SLP Frequency: 3 to 5 out of 7 days, 1 to 3 out of 7 days ?SLP Duration/Estimated Length of Stay: 3 weeks  ? ?Due to the current state of emergency, patients may not be receiving their 3-hours of Medicare-mandated therapy. ? ? Team Interventions: ?Nursing Interventions Disease Management/Prevention, Bowel Management,  Patient/Family Education, Discharge Planning, Medication Management  ?PT interventions Training and development officer, Community reintegration, Disease management/prevention, Technical sales engineer stimulation, Neuromuscular re-education, Patient/family education, IT trainer, Therapeutic Exercise, Wheelchair propulsion/positioning, Skin care/wound management, UE/LE Coordination activities, Visual/perceptual remediation/compensation, UE/LE Strength taining/ROM, Therapeutic Activities, Splinting/orthotics, Psychosocial support, Pain management, DME/adaptive equipment instruction, Functional mobility training, Discharge planning, Cognitive remediation/compensation, Ambulation/gait training  ?OT Interventions Balance/vestibular training, Discharge planning, Functional electrical stimulation, Pain management, Self Care/advanced ADL retraining, Therapeutic Activities, UE/LE Coordination activities, Visual/perceptual remediation/compensation, Therapeutic Exercise, Skin care/wound managment, Patient/family education, Functional mobility training, Disease mangement/prevention, Cognitive remediation/compensation, Community reintegration, DME/adaptive equipment instruction, Neuromuscular re-education, Psychosocial support, Splinting/orthotics, UE/LE Strength taining/ROM, Wheelchair propulsion/positioning  ?SLP Interventions Cognitive remediation/compensation, Internal/external aids, Cueing hierarchy, Medication managment, Therapeutic Activities, Functional tasks, Patient/family education  ?TR Interventions    ?SW/CM Interventions Discharge Planning, Psychosocial Support, Patient/Family Education  ? ?Barriers to Discharge ?MD  Medical stability  ?Nursing Decreased caregiver support, Home environment access/layout ?1 level 3 ste  bil rails with spouse and daughter to assist; has RW, 3N1 and BSC  ?PT Decreased caregiver support, Home environment access/layout, Insurance for SNF coverage, Weight, Incontinence ?   ?OT Weight ?    ?SLP   ?   ?SW Insurance for SNF coverage, Weight ?   ? ?Team Discharge Planning: ?Destination: PT-Home ,OT- Home , SLP-Home ?Projected Follow-up: PT-Home health PT, 24 hour supervision/assistance, OT-  24 hour supervision/assistance, Home health OT, Outpatient OT, SLP-24 hour supervision/assistance, Home Health SLP, Outpatient SLP ?Projected Equipment Needs: PT-To be determined, OT- To  be determined, SLP-None recommended by SLP ?Equipment Details: PT- , OT-  ?Patient/family involved in discharge planning: PT- Patient,  OT-Patient, SLP-Patient ? ?MD ELOS: 14-21 days ?Medical Rehab Prognosis:  Excellent ?Assessment: The patient has been admitted for CIR therapies with the diagnosis of left basal ganglia infarction. The team will be addressing functional mobility, strength, stamina, balance, safety, adaptive techniques and equipment, self-care, bowel and bladder mgt, patient and caregiver education. Goals have been set at [facility] to supervision.Anticipated discharge destination is home.  ? ? ?See Team Conference Notes for weekly updates to the plan of care  ?

## 2022-01-20 NOTE — Progress Notes (Signed)
Speech Language Pathology Daily Session Note ? ?Patient Details  ?Name: Suvi Rost ?MRN: [id number] ?Date of Birth: 04-Oct-1956 ? ?Today's Date: 01/20/2022 ?SLP Individual Time: 0865-7846 ?SLP Individual Time Calculation (min): 40 min ? ?Short Term Goals: ?Week 1: SLP Short Term Goal 1 (Week 1): Patient will recall new, daily information with use of external aids with Min verbal cues. ?SLP Short Term Goal 2 (Week 1): Patient will demosntrate functional problem solving for basic and familiar tasks with supervision level verbal cues. ?SLP Short Term Goal 3 (Week 1): Patient will attend to right field of enviornment/body during functional tasks with Min verbal cues. ?SLP Short Term Goal 4 (Week 1): Patient will maximize speech intelligibility to 90% at the sentence level with Min verbal cues for use speech intelligibility strategies. ? ?Skilled Therapeutic Interventions: ?Pt seen for skilled ST with focus on cognitive goals, pt in bed with daughter present throughout. Pt emotional this morning discussing CVA and how it has impacted her physically and cognitively, SLP providing emotional support as needed. SLP facilitating functional problem solving and verbal reasoning task by generating familiar names of places for each letter of alphabet. Pt requiring extra time and initial max A cues fading to mod A multimodal cues to increase accuracy of task. Pt does endorse her difficulty with exercise and word finding challenges are new since CVA. Pt demonstrate ability to scan L-R between SLP and daughter throughout tx session. Speech intelligibility ~85% in quiet environment, verbal cues to utilize speech intelligibility compensatory strategies were beneficial at sentence level. Pt left in bed with daughter present for needs, cont ST POC.  ? ?Pain ?Pain Assessment ?Pain Scale: 0-10 ?Pain Score: 0-No pain ? ?Therapy/Group: Individual Therapy ? ?Tacey Ruiz ?01/20/2022, 9:24 AM ?

## 2022-01-20 NOTE — Progress Notes (Signed)
Hypoglycemic Event ? ?CBG: 63 ? ?Treatment: 8 oz juice/soda ? ?Symptoms: None ? ?Follow-up CBG: Time:2215 CBG Result:111 ? ?Possible Reasons for Event: Unknown ? ?Comments/MD notified:swartz ? ? ? ?Chisa Kushner ? ? ?

## 2022-01-20 NOTE — Progress Notes (Signed)
?                                                       PROGRESS NOTE ? ? ?Subjective/Complaints: ?Rash seems better. Slept more last night. Daughter in room helping her with breakfast.  ? ?ROS: Patient denies fever, rash, sore throat, blurred vision, dizziness, nausea, vomiting, diarrhea, cough, shortness of breath or chest pain, joint or back/neck pain, headache, or mood change.  ? ? ?Objective: ?  ?No results found. ?Recent Labs  ?  01/17/22 ?2146 01/18/22 ?0542  ?WBC 8.8 8.2  ?HGB 11.3* 11.5*  ?HCT 36.9 35.6*  ?PLT 155 169  ? ?Recent Labs  ?  01/18/22 ?0542  ?NA 140  ?K 4.2  ?CL 108  ?CO2 26  ?GLUCOSE 115*  ?BUN 24*  ?CREATININE 1.01*  ?CALCIUM 9.1  ? ? ?Intake/Output Summary (Last 24 hours) at 01/20/2022 1017 ?Last data filed at 01/19/2022 1900 ?Gross per 24 hour  ?Intake 480 ml  ?Output --  ?Net 480 ml  ?  ? ?  ? ?Physical Exam: ?Vital Signs ?Blood pressure 128/87, pulse 82, temperature 97.6 ?F (36.4 ?C), temperature source Oral, resp. rate 18, height 5' (1.524 m), weight 126.3 kg, SpO2 99 %. ?Constitutional: No distress . Vital signs reviewed. obese ?HEENT: NCAT, EOMI, oral membranes moist ?Neck: supple ?Cardiovascular: RRR without murmur. No JVD    ?Respiratory/Chest: CTA Bilaterally without wheezes or rales. Normal effort    ?GI/Abdomen: BS +, non-tender, non-distended ?Ext: no clubbing, cyanosis, or edema ?Psych: pleasant and cooperative  ?Skin: intact ?Neurological:  ?   Comments: Patient is alert.  No acute distress.  Makes eye contact with examiner.  Oriented to person and place.  Fair awareness of deficits. RUE remains 0/5, RLE 1-2/5, Left side 5/5. Sensation intact. ? ? ?Assessment/Plan: ?1. Functional deficits which require 3+ hours per day of interdisciplinary therapy in a comprehensive inpatient rehab setting. ?Physiatrist is providing close team supervision and 24 hour management of active medical problems listed below. ?Physiatrist and rehab team continue to assess barriers to discharge/monitor  patient progress toward functional and medical goals ? ?Care Tool: ? ?Bathing ?   ?Body parts bathed by patient: Buttocks, Front perineal area, Right upper leg, Left upper leg  ?   ?  ?  ?Bathing assist Assist Level: Total Assistance - Patient < 25% ?  ?  ?Upper Body Dressing/Undressing ?Upper body dressing   ?What is the patient wearing?: Pull over shirt ?   ?Upper body assist Assist Level: Total Assistance - Patient < 25% ?   ?Lower Body Dressing/Undressing ?Lower body dressing ? ? ?   ?  ? ?  ? ?Lower body assist Assist for lower body dressing: Total Assistance - Patient < 25% ?   ? ?Toileting ?Toileting    ?Toileting assist Assist for toileting: 2 Helpers ?  ?  ?Transfers ?Chair/bed transfer ? ?Transfers assist ? Chair/bed transfer activity did not occur: Safety/medical concerns ? ?Chair/bed transfer assist level: Dependent - mechanical lift (stedy) ?  ?  ?Locomotion ?Ambulation ? ? ?Ambulation assist ? ? Ambulation activity did not occur: Safety/medical concerns ? ?  ?  ?   ? ?Walk 10 feet activity ? ? ?Assist ? Walk 10 feet activity did not occur: Safety/medical concerns ? ?  ?   ? ?Walk 50 feet activity ? ? ?  Assist Walk 50 feet with 2 turns activity did not occur: Safety/medical concerns ? ?  ?   ? ? ?Walk 150 feet activity ? ? ?Assist Walk 150 feet activity did not occur: Safety/medical concerns ? ?  ?  ?  ? ?Walk 10 feet on uneven surface  ?activity ? ? ?Assist Walk 10 feet on uneven surfaces activity did not occur: Safety/medical concerns ? ? ?  ?   ? ?Wheelchair ? ? ? ? ?Assist Is the patient using a wheelchair?: Yes ?Type of Wheelchair: Manual ?  ? ?Wheelchair assist level: Dependent - Patient 0% ?Max wheelchair distance: 0  ? ? ?Wheelchair 50 feet with 2 turns activity ? ? ? ?Assist ? ?  ?  ? ? ?Assist Level: Dependent - Patient 0%  ? ?Wheelchair 150 feet activity  ? ? ? ?Assist ?   ? ? ?Assist Level: Dependent - Patient 0%  ? ?Blood pressure 128/87, pulse 82, temperature 97.6 ?F (36.4 ?C), temperature  source Oral, resp. rate 18, height 5' (1.524 m), weight 126.3 kg, SpO2 99 %. ? ?Medical Problem List and Plan: ?1. Functional deficits secondary to left CR confluent infarct along with punctate left BG, thalamus and right cerebellum infarct as well as history of remote right cerebellar infarct identified on CT imaging November 2020 ?            -patient may shower ?            -ELOS/Goals: 14-21 days MinA PT, S OT, S SLP ?            -HFU scheduled ? -Continue CIR therapies including PT, OT, and SLP  ?2.  Antithrombotics: ?-DVT/anticoagulation:  Pharmaceutical: Lovenox ?            -antiplatelet therapy: Aspirin 325 mg daily and Plavix 75 mg daily x3 months then aspirin alone ?3. Pain Management: Tylenol as needed ?4. Mood: Provide emotional support ?            -antipsychotic agents: N/A ?5. Neuropsych: This patient is capable of making decisions on her own behalf. ?6. Skin/Wound Care: Routine skin checks ?7. Fluids/Electrolytes/Nutrition:   ? -appears to have good appetite, eating  100% ?8.  Diabetes mellitus.  Hemoglobin A1c 13.1.  Insulin therapy as directed with diabetic teaching. Educated regarding avoiding foods with added sugar.  ? CBG (last 3)  ?Recent Labs  ?  01/19/22 ?2122 01/19/22 ?2214 01/20/22 ?3149  ?GLUCAP 63* 111* 109*  ?  -tight control. Observe for further hypoglycemia before adjusting insulin. Has been well controlled prior ?9.  Hypertension.  Norvasc 10 mg daily.  Monitor with increased mobility. Add magnesium gluconate 250mg  HS.  ? 3/25 bp borderline controlled ?10.  Hyperlipidemia.  Lipitor ?11.  Morbid obesity.  BMI greater than 56.  Dietary follow-up ?12. Vitamin D deficiency: start ergocalciferol 50,000U once per week for 7 weeks ?13. Insomnia: resolved with melatonin 3mg  HS, will change to prn ?14. AKI: improving, place nursing order to encourage 6-8 glasses of water per day, repeat creatinine ordered for Monday. ?15. Rash on posterior legs: hypoallergenic sheets and hydrocortisone cream  ordered --appears improved ? ? ?LOS: ?3 days ?A FACE TO FACE EVALUATION WAS PERFORMED ? ? ?01/20/2022, 10:17 AM  ? ?  ?

## 2022-01-20 NOTE — Progress Notes (Signed)
Physical Therapy Session Note ? ?Patient Details  ?Name: Samantha [name] ?MRN: [id number] ?Date of Birth: May 02, 1956 ? ?Today's Date: 01/20/2022 ?PT Individual Time: 1500-1610 ?PT Individual Time Calculation (min): 70 min  ? ?Short Term Goals: ?Week 1:  PT Short Term Goal 1 (Week 1): Pt will complete bed mobility with maxA of 1 person ?PT Short Term Goal 2 (Week 1): Pt will complete bed<>chair transfer with maxA and LRAD ?PT Short Term Goal 3 (Week 1): Pt will tolerate sitting in w/c >2 hours outside of therapy sessions ?PT Short Term Goal 4 (Week 1): pt will ambulate 65ft with maxA and LRAD ? ?Skilled Therapeutic Interventions/Progress Updates:  ?  Pt received seated in bed eager for therapy session. No complaints of pain. Supine to sit with mod A needed for trunk elevation and weight shifting in order to scoot L hip towards EOB, HOB elevated and use of bedrail. Sit to stand with mod A to stedy from elevated bed. Stedy transfer to w/c. Attempt to have pt perform w/c mobility, difficulty due to height of w/c (LE do not reach the floor) and wheel placement posterior to UE reach making it difficult to propel efficiently. However, due to body habitus chair width is appropriate for patient. Sit to stand in // bars x 5 reps with min A with R knee blocked, use of mirror for visual feedback for upright posture and manual cueing for hip and trunk extension. Attempt to have pt perform alt LE lifts in standing in // bars, unable to due to RLE weakness. Pt fatigues quickly with standing activity and stands for 45 sec at most. Seated lateral leans onto mat table 2 x 10 reps each direction with CGA to return to midline from R side, Supervision from L side; activity performed for core strengthening and UE WBing. Pt requests to remain seated in w/c at end of session, needs in reach, chair alarm in place, daughter present. ? ?Therapy Documentation ?Precautions:  ?Precautions ?Precautions: Fall ?Precaution Comments: R hemi (RUE >  RLE) ?Restrictions ?Weight Bearing Restrictions: No ? ? ? ? ? ?Therapy/Group: Individual Therapy ? ? ?Peter Congo, PT, DPT, CSRS ?01/20/2022, 4:14 PM  ?

## 2022-01-21 LAB — GLUCOSE, CAPILLARY
Glucose-Capillary: 79 mg/dL (ref 70–99)
Glucose-Capillary: 140 mg/dL — ABNORMAL HIGH (ref 70–99)
Glucose-Capillary: 101 mg/dL — ABNORMAL HIGH (ref 70–99)
Glucose-Capillary: 135 mg/dL — ABNORMAL HIGH (ref 70–99)

## 2022-01-22 ENCOUNTER — Inpatient Hospital Stay (HOSPITAL_COMMUNITY): Payer: 59

## 2022-01-22 DIAGNOSIS — R609 Edema, unspecified: Secondary | ICD-10-CM

## 2022-01-22 LAB — GLUCOSE, CAPILLARY
Glucose-Capillary: 80 mg/dL (ref 70–99)
Glucose-Capillary: 192 mg/dL — ABNORMAL HIGH (ref 70–99)
Glucose-Capillary: 169 mg/dL — ABNORMAL HIGH (ref 70–99)
Glucose-Capillary: 81 mg/dL (ref 70–99)

## 2022-01-22 LAB — BASIC METABOLIC PANEL
Anion gap: 6 (ref 5–15)
BUN: 26 mg/dL — ABNORMAL HIGH (ref 8–23)
CO2: 25 mmol/L (ref 22–32)
Calcium: 8.8 mg/dL — ABNORMAL LOW (ref 8.9–10.3)
Chloride: 108 mmol/L (ref 98–111)
Creatinine, Ser: 1.04 mg/dL — ABNORMAL HIGH (ref 0.44–1.00)
GFR, Estimated: >60 mL/min (ref >60)
Glucose, Bld: 83 mg/dL (ref 70–99)
Potassium: 4.2 mmol/L (ref 3.5–5.1)
Sodium: 139 mmol/L (ref 135–145)

## 2022-01-22 MED ORDER — HYDROCORTISONE 1 % EX CREA
TOPICAL_CREAM | Freq: Four times a day (QID) | CUTANEOUS | Status: DC | PRN
  Filled 2022-01-22: qty 28

## 2022-01-22 MED ORDER — APIXABAN 5 MG PO TABS
5.0000 mg | ORAL_TABLET | Freq: Two times a day (BID) | ORAL | Status: DC
  Administered 2022-01-29 – 2022-02-11 (×27): 5 mg via ORAL
  Filled 2022-01-22 (×27): qty 1

## 2022-01-22 MED ORDER — APIXABAN 5 MG PO TABS
10.0000 mg | ORAL_TABLET | Freq: Two times a day (BID) | ORAL | Status: AC
  Administered 2022-01-22 – 2022-01-29 (×14): 10 mg via ORAL
  Filled 2022-01-22 (×15): qty 2

## 2022-01-22 MED ORDER — MAGNESIUM HYDROXIDE 400 MG/5ML PO SUSP
30.0000 mL | Freq: Once | ORAL | Status: AC
  Administered 2022-01-22: 30 mL via ORAL
  Filled 2022-01-22 (×3): qty 30

## 2022-01-22 NOTE — Progress Notes (Signed)
ANTICOAGULATION CONSULT NOTE - Initial Consult ? ?Pharmacy Consult for Apixaban ?Indication: LLE DVT ? ?Allergies  ?Allergen Reactions  ? Penicillins Rash  ? ? ?Patient Measurements: ?Height: 5' (152.4 cm) ?Weight: 126.3 kg (278 lb 7.1 oz) ?IBW/kg (Calculated) : 45.5 ? ?Vital Signs: ?Temp: 98.7 ?F (37.1 ?C) (03/27 1314) ?Temp Source: Oral (03/27 0417) ?BP: 141/69 (03/27 1314) ?Pulse Rate: 87 (03/27 1314) ? ?Labs: ?Recent Labs  ?  01/22/22 ?0627  ?CREATININE 1.04*  ? ? ?Estimated Creatinine Clearance: 67.1 mL/min (A) (by C-G formula based on SCr of 1.04 mg/dL (H)). ? ? ?Medical History: ?History reviewed. No pertinent past medical history. ? ?Medications:  ?Scheduled:  ? amLODipine  10 mg Oral Daily  ? aspirin EC  325 mg Oral Daily  ? atorvastatin  80 mg Oral q1800  ? clopidogrel  75 mg Oral Daily  ? enoxaparin (LOVENOX) injection  60 mg Subcutaneous Q24H  ? insulin aspart  0-15 Units Subcutaneous TID WC  ? insulin aspart  6 Units Subcutaneous TID WC  ? insulin glargine-yfgn  45 Units Subcutaneous Daily  ? magnesium hydroxide  30 mL Oral Once  ? magnesium oxide  400 mg Oral QHS  ? pneumococcal 20-valent conjugate vaccine  0.5 mL Intramuscular Tomorrow-1000  ? Vitamin D (Ergocalciferol)  50,000 Units Oral Q7 days  ? ? ?Assessment: ?66 yo F admitted to inpatient rehab on 3/22 s/p CVA.  Pt evaluated today and noted to have acute LLE DVT despite VTE ppx with enoxaparin at 60mg  SQ q24h.   ? ?Pharmacy has been consulted to start apixaban for acute VTE ? ?Goal of Therapy:  ?Therapeutic anticoagulation ?Monitor platelets by anticoagulation protocol: Yes ?  ?Plan:  ?DC enoxaparin ?Apixaban 10mg  PO BID x 7 days, then apixaban 5mg  BID therapfter ?CBC in AM to assess baseline and as needed.  Monitor for bleeding. ?Follow-up plans with rehab MD / neuro MD to continue ASA + Plavix in the setting of new apixaban. ?Apixaban education to be provided. ? ? ? , Pharm.D., BCPS ?Clinical Pharmacist ? ?**Pharmacist phone  directory can be found on [email or web address] listed under [facility]. ? ?01/22/2022 3:57 PM ? ? ? ?

## 2022-01-22 NOTE — Progress Notes (Signed)
Occupational Therapy Session Note ? ?Patient Details  ?Name: Samantha [name] ?MRN: [id number] ?Date of Birth: Feb 21, 1956 ? ?Today's Date: 01/22/2022 ?OT Individual Time: 4481-8563 ?OT Individual Time Calculation (min): 41 min  ?19 mins missed d/t fatigue  ? ? ?Short Term Goals: ?Week 1:  OT Short Term Goal 1 (Week 1): Pt will complete donning/doffing shirt with mod assist ?OT Short Term Goal 2 (Week 1): Pt will donn/doff pants with mod assist ?OT Short Term Goal 3 (Week 1): Pt will complete stand pivot with mod assist ?OT Short Term Goal 4 (Week 1): Pt will complete toileting with max assist at [facility] level. ? ?Skilled Therapeutic Interventions/Progress Updates:  ?Pt greeted seated in w/c agreeable to OT intervention. Session focus on functional mobility, dynamic standing balance, RUE AROM and decreasing overall caregiver burden.     ?Pt transported to gym with total A where pt completed stedy transfer to EOM, pt able to stand to stedy from w/c with MAX A +2, pt able to stand from seat of stedy with CGA. Pt completed various therapeutic activities focused on RUE ARO with pt completing towel slides with an emphasis on horizontal ABD/ADD, pt needed support at R elbow to elminate gravity. Pt needed hand over hand assist for scapular protraction/retraction. Pt seemed to very encouraged by task. Pt able to stand to stedy from EOM with MODA +2, worked on lateral weight shift to R side with pt reaching dynamically with LUE to retrieve post-its from wall with emphasis on weighting shifting to R side for NMR. Pt completed x2 trials with MIIN A for balance. Pt HR increased to 136 bpm needing seated rest break. Pt then reports fatigue requesting to return to room, total A transport back to room with pt left seated in w/c with alarm belt activated and all needs within reach.                      ? ?Therapy Documentation ?Precautions:  ?Precautions ?Precautions: Fall ?Precaution Comments: R hemi (RUE > RLE) ?Restrictions ?Weight Bearing  Restrictions: No ? ?Pain:no pain reported during session  ? ? ? ?Therapy/Group: Individual Therapy ? ?Barron Schmid ?01/22/2022, 2:41 PM ?

## 2022-01-22 NOTE — Progress Notes (Addendum)
?                                                       PROGRESS NOTE ? ? ?Subjective/Complaints: ?Rash has resolved, would like to still have the hydrocortisone if needed- changed to PRN ?Constipated ? ?ROS: Patient denies fever, rash, sore throat, blurred vision, dizziness, nausea, vomiting, diarrhea, cough, shortness of breath or chest pain, joint or back/neck pain, headache, or mood change. +constipation ? ? ?Objective: ?  ?No results found. ?No results for input(s): WBC, HGB, HCT, PLT in the last 72 hours. ? ?Recent Labs  ?  01/22/22 ?0627  ?NA 139  ?K 4.2  ?CL 108  ?CO2 25  ?GLUCOSE 83  ?BUN 26*  ?CREATININE 1.04*  ?CALCIUM 8.8*  ? ? ?Intake/Output Summary (Last 24 hours) at 01/22/2022 1044 ?Last data filed at 01/22/2022 0725 ?Gross per 24 hour  ?Intake 544 ml  ?Output --  ?Net 544 ml  ?  ? ?  ? ?Physical Exam: ?Vital Signs ?Blood pressure (!) 164/84, pulse 83, temperature 97.6 ?F (36.4 ?C), temperature source Oral, resp. rate 18, height 5' (1.524 m), weight 126.3 kg, SpO2 95 %. ?Constitutional: No distress . Vital signs reviewed. obese ?HEENT: NCAT, EOMI, oral membranes moist ?Neck: supple ?Cardiovascular: RRR without murmur. No JVD    ?Respiratory/Chest: CTA Bilaterally without wheezes or rales. Normal effort    ?GI/Abdomen: BS +, non-tender, non-distended ?Ext: LLE swelling  ?Psych: pleasant and cooperative  ?Skin: intact ?Neurological:  ?   Comments: Patient is alert.  No acute distress.  Makes eye contact with examiner.  Oriented to person and place.  Fair awareness of deficits. RUE remains 0/5, RLE 1-2/5, Left side 5/5. Sensation intact. ? ? ?Assessment/Plan: ?1. Functional deficits which require 3+ hours per day of interdisciplinary therapy in a comprehensive inpatient rehab setting. ?Physiatrist is providing close team supervision and 24 hour management of active medical problems listed below. ?Physiatrist and rehab team continue to assess barriers to discharge/monitor patient progress toward functional  and medical goals ? ?Care Tool: ? ?Bathing ?   ?Body parts bathed by patient: Buttocks, Front perineal area, Right upper leg, Left upper leg  ?   ?  ?  ?Bathing assist Assist Level: Total Assistance - Patient < 25% ?  ?  ?Upper Body Dressing/Undressing ?Upper body dressing   ?What is the patient wearing?: Hospital gown only ?   ?Upper body assist Assist Level: Total Assistance - Patient < 25% ?   ?Lower Body Dressing/Undressing ?Lower body dressing ? ? ?   ?What is the patient wearing?: Hospital gown only, Incontinence brief ? ?  ? ?Lower body assist Assist for lower body dressing: Total Assistance - Patient < 25% ?   ? ?Toileting ?Toileting    ?Toileting assist Assist for toileting: Maximal Assistance - Patient 25 - 49% ?  ?  ?Transfers ?Chair/bed transfer ? ?Transfers assist ? Chair/bed transfer activity did not occur: Safety/medical concerns ? ?Chair/bed transfer assist level: Dependent - mechanical lift (stedy) ?  ?  ?Locomotion ?Ambulation ? ? ?Ambulation assist ? ? Ambulation activity did not occur: Safety/medical concerns ? ?  ?  ?   ? ?Walk 10 feet activity ? ? ?Assist ? Walk 10 feet activity did not occur: Safety/medical concerns ? ?  ?   ? ?Walk 50 feet activity ? ? ?  Assist Walk 50 feet with 2 turns activity did not occur: Safety/medical concerns ? ?  ?   ? ? ?Walk 150 feet activity ? ? ?Assist Walk 150 feet activity did not occur: Safety/medical concerns ? ?  ?  ?  ? ?Walk 10 feet on uneven surface  ?activity ? ? ?Assist Walk 10 feet on uneven surfaces activity did not occur: Safety/medical concerns ? ? ?  ?   ? ?Wheelchair ? ? ? ? ?Assist Is the patient using a wheelchair?: Yes ?Type of Wheelchair: Manual ?  ? ?Wheelchair assist level: Dependent - Patient 0% ?Max wheelchair distance: 0  ? ? ?Wheelchair 50 feet with 2 turns activity ? ? ? ?Assist ? ?  ?  ? ? ?Assist Level: Dependent - Patient 0%  ? ?Wheelchair 150 feet activity  ? ? ? ?Assist ?   ? ? ?Assist Level: Dependent - Patient 0%  ? ?Blood  pressure (!) 164/84, pulse 83, temperature 97.6 ?F (36.4 ?C), temperature source Oral, resp. rate 18, height 5' (1.524 m), weight 126.3 kg, SpO2 95 %. ? ?Medical Problem List and Plan: ?1. Functional deficits secondary to left CR confluent infarct along with punctate left BG, thalamus and right cerebellum infarct as well as history of remote right cerebellar infarct identified on CT imaging November 2020 ?            -patient may shower ?            -ELOS/Goals: 14-21 days MinA PT, S OT, S SLP ?            -HFU scheduled ? -Continue CIR therapies including PT, OT, and SLP  ?2.  Antithrombotics: ?-DVT/anticoagulation:  Pharmaceutical: Lovenox ?            -antiplatelet therapy: Aspirin 325 mg daily and Plavix 75 mg daily x3 months then aspirin alone ?3. Pain Management: Tylenol as needed ?4. Mood: Provide emotional support ?            -antipsychotic agents: N/A ?5. Neuropsych: This patient is capable of making decisions on her own behalf. ?6. Skin/Wound Care: Routine skin checks ?7. Fluids/Electrolytes/Nutrition:   ? -appears to have good appetite, eating  100% ?8.  Diabetes mellitus.  Hemoglobin A1c 13.1.  Insulin therapy as directed with diabetic teaching. Educated regarding avoiding foods with added sugar.  ? CBG (last 3)  ?Recent Labs  ?  01/21/22 ?1651 01/21/22 ?2117 01/22/22 ?0622  ?GLUCAP 101* 135* 80  ?  -tight control. Observe for further hypoglycemia before adjusting insulin. Has been well controlled prior ?9.  Hypertension.  Norvasc 10 mg daily.  Monitor with increased mobility. Add magnesium gluconate 250mg  HS.  ? 3/25 bp borderline controlled ?10.  Hyperlipidemia.  Lipitor ?11.  Morbid obesity.  BMI greater than 56. Continue heart healthy/carb modified diet ?12. Vitamin D deficiency: start ergocalciferol 50,000U once per week for 7 weeks ?13. Insomnia: resolved with melatonin 3mg  HS, will change to prn ?14. AKI: improving, place nursing order to encourage 6-8 glasses of water per day, repeat creatinine  ordered for Monday. ?15. Rash on posterior legs: hypoallergenic sheets and hydrocortisone cream ordered, resolved, hydrocortisone changed to prn ?16. LLE swelling: vascular ultrasound ordered ? ? ?LOS: ?5 days ?A FACE TO FACE EVALUATION WAS PERFORMED ? ? Samantha [name] ?01/22/2022, 10:44 AM  ? ?  ?

## 2022-01-22 NOTE — Progress Notes (Signed)
Physical Therapy Session Note ? ?Patient Details  ?Name: Samantha [name] ?MRN: [id number] ?Date of Birth: 10/15/1956 ? ?Today's Date: 01/22/2022 ?PT Individual Time: 6144-3154 ?PT Individual Time Calculation (min): 55 min  ? ?Short Term Goals: ?Week 1:  PT Short Term Goal 1 (Week 1): Pt will complete bed mobility with maxA of 1 person ?PT Short Term Goal 2 (Week 1): Pt will complete bed<>chair transfer with maxA and LRAD ?PT Short Term Goal 3 (Week 1): Pt will tolerate sitting in w/c >2 hours outside of therapy sessions ?PT Short Term Goal 4 (Week 1): pt will ambulate 32ft with maxA and LRAD ? ?Skilled Therapeutic Interventions/Progress Updates:  ?   ?Pt presenting supine in bed and agreeable to PT tx - denies pain. Donned pants totalA at bed level - can roll to her R with modA and requires totalA for rolling L. Shoes donned dependant assist for time.  ? ?Supine<>sitting EOB with maxA with HOB slightly raised. Able to sit unsupported at EOB with supervision. Used Stedy to transfer to w/c - required elevated surface and +2 modA for standing in the [place] - able to stand from perched position with CGA.  ? ?Transported in w/c to main rehab gym and wheeled inside // bars. Focused remainder of session on NMR for RLE weight bearing, standing balance/tolerance, and pre-gait training.  ?-sit<>stand in // bars with CGA - standing trials with standing tolerance 30seconds - completed x5 reps with 2 min rest breaks b/w each stand ?-lateral weight shifting L<>R 2x10 in // bars with CGA/minA for hand over hand assist for paretic LUE ?-terminal knee extensions on R 2x5 + 1x10 with CGA/minA for balance and hand over hand assist ?-attempted pre-gait stepping with both LLE and RLE but pt very anxious and fearful of falling so deferred. ?*Pt required ~33minute rest breaks b/w each activity and set due to fatigue. ? ?Pt returned to her room in her w/c - remained seated with LUE supported in arm trough, safety belt alarm on, all needs in  reach.  ? ?Therapy Documentation ?Precautions:  ?Precautions ?Precautions: Fall ?Precaution Comments: R hemi (RUE > RLE) ?Restrictions ?Weight Bearing Restrictions: No ?General: ?  ? ?Therapy/Group: Individual Therapy ? ?Lorena Clearman P Jacie Tristan ?01/22/2022, 7:35 AM  ?

## 2022-01-22 NOTE — Progress Notes (Signed)
Speech Language Pathology Daily Session Note ? ?Patient Details  ?Name: Samantha [name] ?MRN: [id number] ?Date of Birth: 01-Apr-1956 ? ?Today's Date: 01/22/2022 ?SLP Individual Time: 1030-1130 ?SLP Individual Time Calculation (min): 60 min ? ?Short Term Goals: ?Week 1: SLP Short Term Goal 1 (Week 1): Patient will recall new, daily information with use of external aids with Min verbal cues. ?SLP Short Term Goal 2 (Week 1): Patient will demosntrate functional problem solving for basic and familiar tasks with supervision level verbal cues. ?SLP Short Term Goal 3 (Week 1): Patient will attend to right field of enviornment/body during functional tasks with Min verbal cues. ?SLP Short Term Goal 4 (Week 1): Patient will maximize speech intelligibility to 90% at the sentence level with Min verbal cues for use speech intelligibility strategies. ? ?Skilled Therapeutic Interventions: ?Pt seen this date for skilled ST intervention targeting cognitive-linguistic goals outlined above. Upon arrival, pt seated in wc with chair alarm donned. Agreeable to ST intervention in her hospital room. Of note, pt intermittently labile throughout today's session. ? ?SLP provided verbal education re: compensatory word finding strategies, purpose of IPR, and ST POC. With use of multimodal prompts and errorless learning principles, pt recalled 3 out of 3 therapies she is currently receiving following > 1 minute delay with distractions; 1 out of 3 with Mod I. Completed a functional divergent reasoning/problem-solving task re: use of call bell given Min-Mod verbal A. Speech intelligibility noted to be subjectively 90% at the sentence level to an unfamiliar listener in a quiet environment with Mod I.  ? ?Session concluded with pt in wheelchair with chair alarm on, call bell reviewed + within reach, and all immediate needs met. Continue per current ST plan of care next session.  ? ?Pain ?Denies pain; NAD ? ?Therapy/Group: Individual Therapy ? ?Norvil Martensen A  Skylier Kretschmer ?01/22/2022, 1:35 PM ?

## 2022-01-22 NOTE — Progress Notes (Addendum)
Left lower extremity venous duplex completed. ?Refer to "CV Proc" under chart review to view preliminary results. ? ?Preliminary results discussed with Dr. Carlis Abbott. ? ?01/22/2022 3:39 PM ?Eula Fried., MHA, RVT, RDCS, RDMS   ?

## 2022-01-23 ENCOUNTER — Other Ambulatory Visit (HOSPITAL_COMMUNITY): Payer: Self-pay

## 2022-01-23 LAB — GLUCOSE, CAPILLARY
Glucose-Capillary: 82 mg/dL (ref 70–99)
Glucose-Capillary: 204 mg/dL — ABNORMAL HIGH (ref 70–99)
Glucose-Capillary: 197 mg/dL — ABNORMAL HIGH (ref 70–99)
Glucose-Capillary: 193 mg/dL — ABNORMAL HIGH (ref 70–99)

## 2022-01-23 LAB — CBC
HCT: 34.1 % — ABNORMAL LOW (ref 36.0–46.0)
Hemoglobin: 11 g/dL — ABNORMAL LOW (ref 12.0–15.0)
MCH: 32.4 pg (ref 26.0–34.0)
MCHC: 32.3 g/dL (ref 30.0–36.0)
MCV: 100.6 fL — ABNORMAL HIGH (ref 80.0–100.0)
Platelets: 196 10*3/uL (ref 150–400)
RBC: 3.39 MIL/uL — ABNORMAL LOW (ref 3.87–5.11)
RDW: 12.9 % (ref 11.5–15.5)
WBC: 7.8 10*3/uL (ref 4.0–10.5)
nRBC: 0.3 % — ABNORMAL HIGH (ref 0.0–0.2)

## 2022-01-23 MED ORDER — MAGNESIUM GLUCONATE 500 MG PO TABS
500.0000 mg | ORAL_TABLET | Freq: Every day | ORAL | Status: DC
  Administered 2022-01-23 – 2022-01-25 (×3): 500 mg via ORAL
  Filled 2022-01-23 (×3): qty 1

## 2022-01-23 MED ORDER — METFORMIN HCL 500 MG PO TABS
500.0000 mg | ORAL_TABLET | Freq: Every day | ORAL | Status: DC
  Administered 2022-01-24: 500 mg via ORAL
  Filled 2022-01-23: qty 1

## 2022-01-23 NOTE — Progress Notes (Signed)
Physical Therapy Session Note ? ?Patient Details  ?Name: Samantha [name] ?MRN: [id number] ?Date of Birth: 1956-08-28 ? ?Today's Date: 01/23/2022 ?PT Individual Time: 0240-9735 ?PT Individual Time Calculation (min): 72 min  ? ?Short Term Goals: ?Week 1:  PT Short Term Goal 1 (Week 1): Pt will complete bed mobility with maxA of 1 person ?PT Short Term Goal 2 (Week 1): Pt will complete bed<>chair transfer with maxA and LRAD ?PT Short Term Goal 3 (Week 1): Pt will tolerate sitting in w/c >2 hours outside of therapy sessions ?PT Short Term Goal 4 (Week 1): pt will ambulate 94f with maxA and LRAD ? ?Skilled Therapeutic Interventions/Progress Updates:  ?   ?Pt presenting seated in w/c - agreeable to PT tx. Denies pain. Transported in w/c to dayroom rehab for time and energy conservation. Focused session on gait training using LiteGait overground.  ? ?Harness donned/doffed in sitting for safety. Ace wrapped her RLE for DF assist and RUE to LiteGait to promote weight bearing and reduce distractions. Some difficulty with harness placement due to body habitus. With PWB through [place] she ambulated 165f+ 1090f 50f8fth modA for gait facilitation and +2 assist for steering LiteGait. Gait distances primarily limited by fatigue with increased work of breathing, pt with very poor endurance and activity tolerance. Pt required facilitating for lateral weight shifting to clear RLE in swing - pt with decreased hip/knee flexion in swing resulting in poor foot clearance and foot drag, ultimately requiring external assist for advancing in swing phase. Pt requiring extended rest breaks b/w gait trials due to fatigue.  ? ?Completed Kinetron while seated in w/c to target hip extensors and general endurance training - 6x10 reps with assist needed for keeping R knee neutral due to "frog legging". ? ?Pt transported back to her room in w/c. Remained seated in w/c with safety belt alarm on, call bell in reach, all needs met. Pt pleased with her  progress today.  ? ?Therapy Documentation ?Precautions:  ?Precautions ?Precautions: Fall ?Precaution Comments: R hemi (RUE > RLE) ?Restrictions ?Weight Bearing Restrictions: No ?General: ?  ? ?Therapy/Group: Individual Therapy ? ?Harlen Danford P Lila Lufkin ?01/23/2022, 7:32 AM  ?

## 2022-01-23 NOTE — Progress Notes (Signed)
Occupational Therapy Session Note ? ?Patient Details  ?Name: Samantha [name] ?MRN: [id number] ?Date of Birth: 1956-06-06 ? ?Today's Date: 01/23/2022 ?OT Individual Time: 0845-1000 ?OT Individual Time Calculation (min): 75 min  ? ? ?Short Term Goals: ?Week 1:  OT Short Term Goal 1 (Week 1): Pt will complete donning/doffing shirt with mod assist ?OT Short Term Goal 2 (Week 1): Pt will donn/doff pants with mod assist ?OT Short Term Goal 3 (Week 1): Pt will complete stand pivot with mod assist ?OT Short Term Goal 4 (Week 1): Pt will complete toileting with max assist at [facility] level. ? ?Skilled Therapeutic Interventions/Progress Updates:  ?  Pt received supine with no c/o pain, agreeable to OT session.  Pt required mod cueing to sequence bed mobility technique, initally requiring max A but with cueing/instruction she was able to come to EOB with only min A, rolling toward the R. She had several postural sways EOB but was able to self correct with min cueing and had no LOB. She completed UB bathing with min A overall for RUE management and reaching under the LUE. UB dressing with mod A, with cueing reuqired for hemi technique. She stood from EOB with mod A using the stedy and required facilitation for RUE management. Pt began incontinent urine void and was quickly transferred to the [facility] over the toilet. Here she had a further void and attempted BM but no success. She stood x3 from the stedy for peri hygiene (mod A) and donning of new brief. She was transferred to the w/c where she donned pants with total A.From the w/c she completed oral care and hair care.  ?She was then taken to the therapy gym via w/c. Remainder of session focused on RUE NMR. Worked on e-stim assisted AROM.  ?1:1 NMES applied to R tricep and R wrist/finger flexion, and R bicep (separate sessions) to assist with muscle activation and NMR. AAROM provided with pt able to activate tricep ~5 degrees with cueing for visual attention. Bicep with ~20 degrees of  gravity eliminated activation.  ?Ratio 1:3 ?Rate 35 pps ?Waveform- Asymmetric ?Ramp 1.0 ?Pulse 300 ?Intensity- 14 ?Duration -  15 min ? ?Report of pain at the beginning of session 0/10 ?Report of pain at the end of session 0/10 ? ?No adverse reactions after treatment and is skin intact.  ? ?Pt was left sitting up in the w/c with all needs met, chair alarm set.  ? ? ?Therapy Documentation ?Precautions:  ?Precautions ?Precautions: Fall ?Precaution Comments: R hemi (RUE > RLE) ?Restrictions ?Weight Bearing Restrictions: No ? ?Therapy/Group: Individual Therapy ? ?Curtis Sites ?01/23/2022, 6:27 AM ?

## 2022-01-23 NOTE — Plan of Care (Signed)
?  Problem: Consults ?Goal: RH STROKE PATIENT EDUCATION ?Description: See Patient Education module for education specifics  ?Outcome: Progressing ?  ?Problem: RH BOWEL ELIMINATION ?Goal: RH STG MANAGE BOWEL WITH ASSISTANCE ?Description: STG Manage Bowel with mod I  Assistance. ?Outcome: Progressing ?Goal: RH STG MANAGE BOWEL W/MEDICATION W/ASSISTANCE ?Description: STG Manage Bowel with Medication with mod I  Assistance. ?Outcome: Progressing ?  ?Problem: RH SAFETY ?Goal: RH STG ADHERE TO SAFETY PRECAUTIONS W/ASSISTANCE/DEVICE ?Description: STG Adhere to Safety Precautions With cues Assistance/Device. ?Outcome: Progressing ?  ?Problem: RH KNOWLEDGE DEFICIT ?Goal: RH STG INCREASE KNOWLEDGE OF DIABETES ?Description: Patient and family will be able to manage DM with medications and dietary modifications using handouts and educational resources independently ?Outcome: Progressing ?Goal: RH STG INCREASE KNOWLEDGE OF HYPERTENSION ?Description: Patient and family will be able to manage HTN with medications and dietary modifications using handouts and educational resources independently ?Outcome: Progressing ?Goal: RH STG INCREASE KNOWLEGDE OF HYPERLIPIDEMIA ?Description: Patient and family will be able to manage HLD with medications and dietary modifications using handouts and educational resources independently ?Outcome: Progressing ?Goal: RH STG INCREASE KNOWLEDGE OF STROKE PROPHYLAXIS ?Description: Patient and family will be able to manage secondary stroke with medications and dietary modifications using handouts and educational resources independently ?Outcome: Progressing ?  ?

## 2022-01-23 NOTE — Progress Notes (Signed)
Patient ID: Samantha [name], female   DOB: Dec 05, 1955, 66 y.o.   MRN: [id number] ?Met with the patient to introduce self, review role, rehab process, team conference and plan of care. Reviewed medications and secondary risks, including HTN, HLD (LDL 115/Trig 82) and DM (A1C 13.1). Patient noted she had a meter but did not take insulin PTA; need confirmation for discharge with insulin and provide education on insulin use for discharge along with DAPT for 3 months then ASA solo per MD. Reviewed dietary modification recommendations and food sources for protein with HH/CMM diet information. Continue to follow along to discharge to address educational needs to facilitate preparation for discharge. [name] ? ?

## 2022-01-23 NOTE — Progress Notes (Signed)
Speech Language Pathology Daily Session Note ? ?Patient Details  ?Name: Samantha [name] ?MRN: [id number] ?Date of Birth: May 07, 1956 ? ?Today's Date: 01/23/2022 ?SLP Individual Time: 9672-8979 ?SLP Individual Time Calculation (min): 45 min ? ?Short Term Goals: ?Week 1: SLP Short Term Goal 1 (Week 1): Patient will recall new, daily information with use of external aids with Min verbal cues. ?SLP Short Term Goal 2 (Week 1): Patient will demosntrate functional problem solving for basic and familiar tasks with supervision level verbal cues. ?SLP Short Term Goal 3 (Week 1): Patient will attend to right field of enviornment/body during functional tasks with Min verbal cues. ?SLP Short Term Goal 4 (Week 1): Patient will maximize speech intelligibility to 90% at the sentence level with Min verbal cues for use speech intelligibility strategies. ? ?Skilled Therapeutic Interventions: ?Pt seen this date for skilled ST intervention targeting cognitive-linguistic goals outlined above. Pt alert/awake and lying in bed. Agreeable to ST intervention in her hospital room. ? ?SLP facilitated today's session by providing re-education re: ST plan of care/goals, purpose of IPR, and current deficits/limitations in the setting of acute CVA. When provided with external aid (e.g. calendar), pt demonstrated orientation to date with Mod verbal + visual A; oriented to self, location, and situation with Mod I. Overall, pt required Mod verbal and visual cues to complete visual alternating attention task to write therapy appointments on monthly calendar. Recalled abbreviation of each therapy received (PT, OT, ST) given Mod verbal and visual A. Finally, to maximize pt's independence during hospital admission, pt participated in novel + functional problem-solving task re: use of TV remote; required Max verbal and visual cues for visual attention to R, motor initiation/execution, and reasoning. Though simple verbal reasoning of basic mental math  calculations re: time only required Supervision A. Subjectively, speech intelligibility noted to be 90% at the sentence level with Mod I. Notebook provided to pt to aid in organization of therapy materials and recall of daily events/activities; pt verbalized understanding of purpose though will require reinforcement.  ? ?Session concluded with pt in bed, LUE supported with pillow, bed alarm on, call bell reviewed, and all immediate needs met. Continue per current ST plan of care next session. ? ?Pain ?Denies pain; NAD ? ?Therapy/Group: Individual Therapy ? ?Emannuel Vise A Loveah Like ?01/23/2022, 12:28 PM ?

## 2022-01-23 NOTE — TOC Benefit Eligibility Note (Signed)
Patient Advocate Encounter ? ?Insurance verification completed.   ? ?The patient is currently admitted and upon discharge could be taking Eliquis 5 mg. ? ?The current 30 day co-pay is, $525.58 due to a $8,800.00 deductible.  ? ?The patient is insured through [employer]  ? ? ? ?Roland Earl, CPhT ?Pharmacy Patient Advocate Specialist ?[facility] Pharm[facility] Patient Advocate Team ?Direct Number: [phone]  Fax: [phone] ? ? ? ? ? ?  ?

## 2022-01-23 NOTE — Progress Notes (Signed)
?                                                       PROGRESS NOTE ? ? ?Subjective/Complaints: ?Rash resolved- would still like to have the hydrocodone PRN ?Discussed her DVT, starting anticoagulation, expected duration of treatment ? ?ROS: Patient denies fever, rash, sore throat, blurred vision, dizziness, nausea, vomiting, diarrhea, cough, shortness of breath or chest pain, joint or back/neck pain, headache, or mood change. +constipation, rash resolved ? ? ?Objective: ?  ?VAS [place] LOWER EXTREMITY VENOUS (DVT) ? ?Result Date: 01/22/2022 ? Lower Venous DVT Study Patient Name:  Samantha [name]  Date of Exam:   01/22/2022 Medical Rec #: [id number]      Accession #:    [id number] Date of Birth: May 05, 1956      Patient Gender: F Patient Age:   65 years Exam Location:  [facility] Procedure:      VAS [place] LOWER EXTREMITY VENOUS (DVT) Referring Phys: Sula Soda --------------------------------------------------------------------------------  Indications: Edema.  Comparison Study: No prior study Performing Technologist: Gertie Fey MHA, RDMS, RVT, RDCS  Examination Guidelines: A complete evaluation includes B-mode imaging, spectral Doppler, color Doppler, and power Doppler as needed of all accessible portions of each vessel. Bilateral testing is considered an integral part of a complete examination. Limited examinations for reoccurring indications may be performed as noted. The reflux portion of the exam is performed with the patient in reverse Trendelenburg.  +-----+---------------+---------+-----------+----------+--------------+ RIGHTCompressibilityPhasicitySpontaneityPropertiesThrombus Aging +-----+---------------+---------+-----------+----------+--------------+ CFV  Full           Yes      Yes                                 +-----+---------------+---------+-----------+----------+--------------+   +---------+---------------+---------+-----------+----------+--------------+ LEFT      CompressibilityPhasicitySpontaneityPropertiesThrombus Aging +---------+---------------+---------+-----------+----------+--------------+ CFV      Full           Yes      Yes                                 +---------+---------------+---------+-----------+----------+--------------+ SFJ      Full                                                        +---------+---------------+---------+-----------+----------+--------------+ FV Prox  Full                                                        +---------+---------------+---------+-----------+----------+--------------+ FV Mid   Full                                                        +---------+---------------+---------+-----------+----------+--------------+ FV DistalFull                                                        +---------+---------------+---------+-----------+----------+--------------+  PFV      Full                                                        +---------+---------------+---------+-----------+----------+--------------+ POP      Full           Yes      Yes                                 +---------+---------------+---------+-----------+----------+--------------+ PTV      None                    No                   Acute          +---------+---------------+---------+-----------+----------+--------------+ PERO     None                    No                   Acute          +---------+---------------+---------+-----------+----------+--------------+ DVT visualized in proximal to mid calf, distal peroneal veins are patent.    Summary: RIGHT: - No evidence of common femoral vein obstruction.  LEFT: - Findings consistent with acute deep vein thrombosis involving the left posterior tibial veins, and left peroneal veins. - No cystic structure found in the popliteal fossa.  *See table(s) above for measurements and observations. Electronically signed by Lemar LivingsBrandon Cain MD on 01/22/2022 at  4:15:09 PM.    Final    ?Recent Labs  ?  01/23/22 ?0604  ?WBC 7.8  ?HGB 11.0*  ?HCT 34.1*  ?PLT 196  ? ? ?Recent Labs  ?  01/22/22 ?0627  ?NA 139  ?K 4.2  ?CL 108  ?CO2 25  ?GLUCOSE 83  ?BUN 26*  ?CREATININE 1.04*  ?CALCIUM 8.8*  ? ? ?Intake/Output Summary (Last 24 hours) at 01/23/2022 1059 ?Last data filed at 01/23/2022 0744 ?Gross per 24 hour  ?Intake 777 ml  ?Output --  ?Net 777 ml  ?  ? ?  ? ?Physical Exam: ?Vital Signs ?Blood pressure (!) 143/65, pulse 68, temperature 97.8 ?F (36.6 ?C), resp. rate 16, height 5' (1.524 m), weight 126.3 kg, SpO2 96 %. ?Constitutional: No distress . Vital signs reviewed. obese ?HEENT: NCAT, EOMI, oral membranes moist ?Neck: supple ?Cardiovascular: RRR without murmur. No JVD    ?Respiratory/Chest: CTA Bilaterally without wheezes or rales. Normal effort    ?GI/Abdomen: BS +, non-tender, non-distended ?Ext: LLE swelling, no tenderness to palpation ?Psych: pleasant and cooperative  ?Skin: intact ?Neurological:  ?   Comments: Patient is alert.  No acute distress.  Makes eye contact with examiner.  Oriented to person and place.  Fair awareness of deficits. RUE remains 0/5, RLE 1-2/5, Left side 5/5. Sensation intact. ? ? ?Assessment/Plan: ?1. Functional deficits which require 3+ hours per day of interdisciplinary therapy in a comprehensive inpatient rehab setting. ?Physiatrist is providing close team supervision and 24 hour management of active medical problems listed below. ?Physiatrist and rehab team continue to assess barriers to discharge/monitor patient progress toward functional and medical goals ? ?Care Tool: ? ?Bathing ?   ?Body parts bathed by patient: Buttocks, Front perineal area, Right  upper leg, Left upper leg  ?   ?  ?  ?Bathing assist Assist Level: Total Assistance - Patient < 25% ?  ?  ?Upper Body Dressing/Undressing ?Upper body dressing   ?What is the patient wearing?: Hospital gown only ?   ?Upper body assist Assist Level: Total Assistance - Patient < 25% ?   ?Lower Body  Dressing/Undressing ?Lower body dressing ? ? ?   ?What is the patient wearing?: Hospital gown only, Incontinence brief ? ?  ? ?Lower body assist Assist for lower body dressing: Total Assistance - Patient < 25% ?   ? ?Toileting ?Toileting    ?Toileting assist Assist for toileting: Maximal Assistance - Patient 25 - 49% ?  ?  ?Transfers ?Chair/bed transfer ? ?Transfers assist ? Chair/bed transfer activity did not occur: Safety/medical concerns ? ?Chair/bed transfer assist level: Dependent - mechanical lift (stedy) ?  ?  ?Locomotion ?Ambulation ? ? ?Ambulation assist ? ? Ambulation activity did not occur: Safety/medical concerns ? ?  ?  ?   ? ?Walk 10 feet activity ? ? ?Assist ? Walk 10 feet activity did not occur: Safety/medical concerns ? ?  ?   ? ?Walk 50 feet activity ? ? ?Assist Walk 50 feet with 2 turns activity did not occur: Safety/medical concerns ? ?  ?   ? ? ?Walk 150 feet activity ? ? ?Assist Walk 150 feet activity did not occur: Safety/medical concerns ? ?  ?  ?  ? ?Walk 10 feet on uneven surface  ?activity ? ? ?Assist Walk 10 feet on uneven surfaces activity did not occur: Safety/medical concerns ? ? ?  ?   ? ?Wheelchair ? ? ? ? ?Assist Is the patient using a wheelchair?: Yes ?Type of Wheelchair: Manual ?  ? ?Wheelchair assist level: Dependent - Patient 0% ?Max wheelchair distance: 0  ? ? ?Wheelchair 50 feet with 2 turns activity ? ? ? ?Assist ? ?  ?  ? ? ?Assist Level: Dependent - Patient 0%  ? ?Wheelchair 150 feet activity  ? ? ? ?Assist ?   ? ? ?Assist Level: Dependent - Patient 0%  ? ?Blood pressure (!) 143/65, pulse 68, temperature 97.8 ?F (36.6 ?C), resp. rate 16, height 5' (1.524 m), weight 126.3 kg, SpO2 96 %. ? ?Medical Problem List and Plan: ?1. Functional deficits secondary to left CR confluent infarct along with punctate left BG, thalamus and right cerebellum infarct as well as history of remote right cerebellar infarct identified on CT imaging November 2020 ?            -patient may shower ?             -ELOS/Goals: 14-21 days MinA PT, S OT, S SLP ?            -HFU scheduled ? -Continue CIR therapies including PT, OT, and SLP  ?2.  Left lower extremity DVTs: Eliquis started ?            -antiplatelet therapy: Aspirin 325 mg daily and Plavix 75 mg daily x3 months then aspirin alone ?3

## 2022-01-24 LAB — IRON AND TIBC
Iron: 32 ug/dL (ref 28–170)
Saturation Ratios: 10 % — ABNORMAL LOW (ref 10.4–31.8)
TIBC: 308 ug/dL (ref 250–450)
UIBC: 276 ug/dL

## 2022-01-24 LAB — VITAMIN B12: Vitamin B-12: 225 pg/mL (ref 180–914)

## 2022-01-24 LAB — BASIC METABOLIC PANEL
Anion gap: 7 (ref 5–15)
BUN: 21 mg/dL (ref 8–23)
CO2: 24 mmol/L (ref 22–32)
Calcium: 8.9 mg/dL (ref 8.9–10.3)
Chloride: 106 mmol/L (ref 98–111)
Creatinine, Ser: 0.94 mg/dL (ref 0.44–1.00)
GFR, Estimated: >60 mL/min (ref >60)
Glucose, Bld: 101 mg/dL — ABNORMAL HIGH (ref 70–99)
Potassium: 4.6 mmol/L (ref 3.5–5.1)
Sodium: 137 mmol/L (ref 135–145)

## 2022-01-24 LAB — GLUCOSE, CAPILLARY
Glucose-Capillary: 91 mg/dL (ref 70–99)
Glucose-Capillary: 123 mg/dL — ABNORMAL HIGH (ref 70–99)
Glucose-Capillary: 175 mg/dL — ABNORMAL HIGH (ref 70–99)
Glucose-Capillary: 177 mg/dL — ABNORMAL HIGH (ref 70–99)

## 2022-01-24 LAB — FOLATE: Folate: 16 ng/mL (ref >5.9)

## 2022-01-24 MED ORDER — INSULIN GLARGINE-YFGN 100 UNIT/ML ~~LOC~~ SOLN
35.0000 [IU] | Freq: Every day | SUBCUTANEOUS | Status: DC
  Administered 2022-01-25: 35 [IU] via SUBCUTANEOUS
  Filled 2022-01-24 (×2): qty 0.35

## 2022-01-24 MED ORDER — METFORMIN HCL 500 MG PO TABS
500.0000 mg | ORAL_TABLET | Freq: Two times a day (BID) | ORAL | Status: DC
  Administered 2022-01-24 – 2022-01-26 (×4): 500 mg via ORAL
  Filled 2022-01-24 (×6): qty 1

## 2022-01-24 NOTE — Progress Notes (Signed)
Occupational Therapy Session Note ? ?Patient Details  ?Name: Samantha [name] ?MRN: [id number] ?Date of Birth: 1956/03/29 ? ?Today's Date: 01/24/2022 ?OT Missed Time: 30 Minutes ?Missed Time Reason: Patient fatigue ? ?Pt received upright in bed, awake, however declining OT intervention at this time due fatigue after having been disrupted frequently throughout the night. Therapist offered self-care and bed level exercises however pt politely declining stating "I'll restart tomorrow." Pt was left upright in bed with bed alarm on and all needs met at therapist departure. ? ?[name] [name] ?01/24/2022, 12:52 PM ?

## 2022-01-24 NOTE — Patient Care Conference (Signed)
Inpatient RehabilitationTeam Conference and Plan of Care Update ?Date: 01/24/2022   Time: 11:15 AM  ? ? ?Patient Name: Samantha [name]      ?Medical Record Number: [id number]  ?Date of Birth: 03/26/1956 ?Sex: Female         ?Room/Bed: 4M12C/4M12C-01 ?Payor Info: Payor: AETNA / Plan: AETNA [employer] [facility]  / Product Type: *No Product type* /   ? ?Admit Date/Time:  01/17/2022  5:11 PM ? ?Primary Diagnosis:  CVA (cerebral vascular accident) ([place]) ? ?Hospital Problems: Principal Problem: ?  CVA (cerebral vascular accident) ([place]) ? ? ? ?Expected Discharge Date: Expected Discharge Date: 02/13/22 ? ?Team Members Present: ?Physician leading conference: Dr. Leeroy Cha ?Social Worker Present: Ovidio Kin, LCSW ?Nurse Present: Dorien Chihuahua, RN ?PT Present: Ginnie Smart, PT ?OT Present: Leretha Pol, OT ?SLP Present: Other (comment) Romelle Starcher, SLP) ?PPS Coordinator present : Ileana Ladd, PT ? ?   Current Status/Progress Goal Weekly Team Focus  ?Bowel/Bladder ? ? Pt is incontinent of bowel/bladder  Pt will gain continence of bowel/bladder  Will assess qshift and PRN   ?Swallow/Nutrition/ Hydration ? ?           ?ADL's ? ? mod assist UB bathing/dressing, mod assist toileting/dressing sit<>stand using stedy, trace RUE MMT  min assist  hemi strategies during self care, functional transfers, NMES RUE and neuro re-ed, balance training   ?Mobility ? ? mod to maxA bed mobility, supervision sitting balance, modA for standing using the Stedy. Ambulating 73f using the LiteGait with +2 assist and requiring ace wrap for DF assist to RLE. Incontinent  minA  RLE NMR, bed mobility, functional transfers, gait training, ENDURANCE (she fatigue so quickly)   ?Communication ? ? Supervision  Mod I for increased speech intelligibility at the conversational level - goal met  Increasing mean length of utterance   ?Safety/Cognition/ Behavioral Observations ? Mod to Max A  Problem-solving for complex daily situations - Min A; Memory - Supervision   Recall of medications and therapies, problem-solving with phone, remote, and money   ?Pain ? ? Pt's pain is a 0 on a scale of 1-10  Pt's pain remain a 0  Will assess qshift and PRN   ?Skin ? ? Pt's skin is intact  Pt's skin will remain intact  Will assess qshift and PRN   ? ? ?Discharge Planning:  ?HOme with husband and adult children. Will see if can come in for observation in therapies so can see amount of care pt requires   ?Team Discussion: ?Patient with DVT on anticoag. med. Rash resolved. Nose bleed reported last pm and little sleep; fatigue and poor endurance noted with right inattention and problem solving and memory issues.  ? ?Patient on target to meet rehab goals: ?yes, currently needs mod assist for upper body care and mod assist for lower body on the stedy. Goals for discharge set for min assist overall. ? ?*See Care Plan and progress notes for long and short-term goals.  ? ?Revisions to Treatment Plan:  ?Lite gait training ?Neuro re-education ?Anticipate will not need insulin at discharge; insulin pen education ongoing ?  ?Teaching Needs: ?Safety, transfers, toileting, medications, secondary risk management, dietary modifications, etc. ?  ?Current Barriers to Discharge: ?Decreased caregiver support and Home enviroment access/layout ? ?Possible Resolutions to Barriers: ?Family education ?Ramp for entry to home ( spouse working on it) ?DME: w/c ?  ? ? Medical Summary ?Current Status: LLE clot, fatigue, insomnia, posterior leg rash resolved, type 2 DM on insulin, vitamin D deficiency ?  Barriers to Discharge: Medical stability ? Barriers to Discharge Comments: LLE clot, fatigue, insomnia,  type 2 DM on insulin, vitamin D deficiency ?Possible Resolutions to [employer]: Eliquis started today, discussed outpatient cost with patient and husband, check B12/folate/iron today, start ergocalciferol 50,000U once per week for 7 weeks ? ? ?Continued Need for Acute Rehabilitation Level of Care: The patient  requires daily medical management by a physician with specialized training in physical medicine and rehabilitation for the following reasons: ?Direction of a multidisciplinary physical rehabilitation program to maximize functional independence : Yes ?Medical management of patient stability for increased activity during participation in an intensive rehabilitation regime.: Yes ?Analysis of laboratory values and/or radiology reports with any subsequent need for medication adjustment and/or medical intervention. : Yes ? ? ?I attest that I was present, lead the team conference, and concur with the assessment and plan of the team. ? ? ?[name] ?01/24/2022, 4:53 PM  ? ? ? ? ? ? ?

## 2022-01-24 NOTE — Progress Notes (Addendum)
Physical Therapy Session Note ? ?Patient Details  ?Name: Samantha [name] ?MRN: [id number] ?Date of Birth: 1956-05-17 ? ?Today's Date: 01/24/2022 ?PT Individual Time: 1015-1030 ?PT Individual Time Calculation (min): 15 min  and Today's Date: 01/24/2022 ?PT Missed Time: 30 Minutes ?Missed Time Reason: Patient fatigue ? ?Short Term Goals: ?Week 1:  PT Short Term Goal 1 (Week 1): Pt will complete bed mobility with maxA of 1 person ?PT Short Term Goal 2 (Week 1): Pt will complete bed<>chair transfer with maxA and LRAD ?PT Short Term Goal 3 (Week 1): Pt will tolerate sitting in w/c >2 hours outside of therapy sessions ?PT Short Term Goal 4 (Week 1): pt will ambulate 2ft with maxA and LRAD ? ?Skilled Therapeutic Interventions/Progress Updates:  ?   ?Pt presenting supine in bed with her husband, Samantha [name], at the bedside. Pt reporting fatigue due to poor nights rest - Samantha [name] and patient requesting to defer therapy services today due to significant fatigue. Spent 15 minutes educating [name] on pt's current mobility status, PT goals, anticipated DME rec's (24inch w/c), etc. Samantha [name] confirms that they are building a ramp to enter their 1 level home. Asked him to measure doorways to make sure the 24inch w/c (measures 32 inches rim to rim) will fit. Pt missed 30 minutes of therapy due to fatigue. Will attempt to make up time as schedule and pt availability permits.  ? ?Therapy Documentation ?Precautions:  ?Precautions ?Precautions: Fall ?Precaution Comments: R hemi (RUE > RLE) ?Restrictions ?Weight Bearing Restrictions: No ?General: ?  ? ?Therapy/Group: Individual Therapy ? ?[name] P Halleigh Comes ?01/24/2022, 7:27 AM  ?

## 2022-01-24 NOTE — Progress Notes (Signed)
Patient ID: Samantha [name], female   DOB: Nov 25, 1955, 66 y.o.   MRN: [id number]  Met with pt and husband-paul who is present in her room to update regarding team conference goals of min assist level and target discharge date of 4/18. Both were agreeable to this plan and pt is hoping once she is sleeping better she will be able to participate more in therapies. Aware will need a ramp into home and husband to measure doorways for PT. Will continue to work on discharge needs and ask neuro-psych to see pt for support due to history of depression ?

## 2022-01-24 NOTE — Progress Notes (Signed)
?                                                       PROGRESS NOTE ? ? ?Subjective/Complaints: ?Insomnia last night due to frequent wakenings ?Discussed Eliquis cost ?Husband concerned regarding her low energy. Discussed checking B12/folate today ? ? ?ROS: Patient denies fever, rash, sore throat, blurred vision, dizziness, nausea, vomiting, diarrhea, cough, shortness of breath or chest pain, joint or back/neck pain, headache, or mood change. +constipation, rash resolved ? ? ?Objective: ?  ?VAS [place] LOWER EXTREMITY VENOUS (DVT) ? ?Result Date: 01/22/2022 ? Lower Venous DVT Study Patient Name:  Samantha [name]  Date of Exam:   01/22/2022 Medical Rec #: [id number]      Accession #:    [id number] Date of Birth: Jun 23, 1956      Patient Gender: F Patient Age:   66 years Exam Location:  [facility] Procedure:      VAS [place] LOWER EXTREMITY VENOUS (DVT) Referring Phys: Leeroy Cha --------------------------------------------------------------------------------  Indications: Edema.  Comparison Study: No prior study Performing Technologist: Maudry Mayhew MHA, RDMS, RVT, RDCS  Examination Guidelines: A complete evaluation includes B-mode imaging, spectral Doppler, color Doppler, and power Doppler as needed of all accessible portions of each vessel. Bilateral testing is considered an integral part of a complete examination. Limited examinations for reoccurring indications may be performed as noted. The reflux portion of the exam is performed with the patient in reverse Trendelenburg.  +-----+---------------+---------+-----------+----------+--------------+ RIGHTCompressibilityPhasicitySpontaneityPropertiesThrombus Aging +-----+---------------+---------+-----------+----------+--------------+ CFV  Full           Yes      Yes                                 +-----+---------------+---------+-----------+----------+--------------+   +---------+---------------+---------+-----------+----------+--------------+  LEFT     CompressibilityPhasicitySpontaneityPropertiesThrombus Aging +---------+---------------+---------+-----------+----------+--------------+ CFV      Full           Yes      Yes                                 +---------+---------------+---------+-----------+----------+--------------+ SFJ      Full                                                        +---------+---------------+---------+-----------+----------+--------------+ FV Prox  Full                                                        +---------+---------------+---------+-----------+----------+--------------+ FV Mid   Full                                                        +---------+---------------+---------+-----------+----------+--------------+ FV DistalFull                                                        +---------+---------------+---------+-----------+----------+--------------+  PFV      Full                                                        +---------+---------------+---------+-----------+----------+--------------+ POP      Full           Yes      Yes                                 +---------+---------------+---------+-----------+----------+--------------+ PTV      None                    No                   Acute          +---------+---------------+---------+-----------+----------+--------------+ PERO     None                    No                   Acute          +---------+---------------+---------+-----------+----------+--------------+ DVT visualized in proximal to mid calf, distal peroneal veins are patent.    Summary: RIGHT: - No evidence of common femoral vein obstruction.  LEFT: - Findings consistent with acute deep vein thrombosis involving the left posterior tibial veins, and left peroneal veins. - No cystic structure found in the popliteal fossa.  *See table(s) above for measurements and observations. Electronically signed by Servando Snare MD on  01/22/2022 at 4:15:09 PM.    Final    ?Recent Labs  ?  01/23/22 ?[place]  ?WBC 7.8  ?HGB 11.0*  ?HCT 34.1*  ?PLT 196  ? ? ?Recent Labs  ?  01/22/22 ?0627 01/24/22 ?0518  ?NA 139 137  ?K 4.2 4.6  ?CL 108 106  ?CO2 25 24  ?GLUCOSE 83 101*  ?BUN 26* 21  ?CREATININE 1.04* 0.94  ?CALCIUM 8.8* 8.9  ? ? ?Intake/Output Summary (Last 24 hours) at 01/24/2022 1116 ?Last data filed at 01/24/2022 [id number] ?Gross per 24 hour  ?Intake 320 ml  ?Output --  ?Net 320 ml  ?  ? ?  ? ?Physical Exam: ?Vital Signs ?Blood pressure 136/69, pulse 80, temperature 98.5 ?F (36.9 ?C), resp. rate 16, height 5' (1.524 m), weight 126.3 kg, SpO2 95 %. ?Constitutional: No distress . Vital signs reviewed. Obese, BMI 54.38 ?HEENT: NCAT, EOMI, oral membranes moist ?Neck: supple ?Cardiovascular: RRR without murmur. No JVD    ?Respiratory/Chest: CTA Bilaterally without wheezes or rales. Normal effort    ?GI/Abdomen: BS +, non-tender, non-distended ?Ext: LLE swelling, no tenderness to palpation ?Psych: pleasant and cooperative  ?Skin: intact ?Neurological:  ?   Comments: Patient is alert.  No acute distress.  Makes eye contact with examiner.  Oriented to person and place.  Fair awareness of deficits. RUE remains 0/5, RLE 1-2/5, Left side 5/5. Sensation intact. ? ? ?Assessment/Plan: ?1. Functional deficits which require 3+ hours per day of interdisciplinary therapy in a comprehensive inpatient rehab setting. ?Physiatrist is providing close team supervision and 24 hour management of active medical problems listed below. ?Physiatrist and rehab team continue to assess barriers to discharge/monitor patient progress toward functional and medical goals ? ?Care Tool: ? ?Bathing ?   ?  Body parts bathed by patient: Buttocks, Front perineal area, Right upper leg, Left upper leg  ?   ?  ?  ?Bathing assist Assist Level: Total Assistance - Patient < 25% ?  ?  ?Upper Body Dressing/Undressing ?Upper body dressing   ?What is the patient wearing?: [address] only ?   ?Upper body  assist Assist Level: Total Assistance - Patient < 25% ?   ?Lower Body Dressing/Undressing ?Lower body dressing ? ? ?   ?What is the patient wearing?: Hospital gown only, Incontinence brief ? ?  ? ?Lower body assist Assist for lower body dressing: Total Assistance - Patient < 25% ?   ? ?Toileting ?Toileting    ?Toileting assist Assist for toileting: Maximal Assistance - Patient 25 - 49% ?  ?  ?Transfers ?Chair/bed transfer ? ?Transfers assist ? Chair/bed transfer activity did not occur: Safety/medical concerns ? ?Chair/bed transfer assist level: Dependent - mechanical lift (stedy) ?  ?  ?Locomotion ?Ambulation ? ? ?Ambulation assist ? ? Ambulation activity did not occur: Safety/medical concerns ? ?  ?  ?   ? ?Walk 10 feet activity ? ? ?Assist ? Walk 10 feet activity did not occur: Safety/medical concerns ? ?  ?   ? ?Walk 50 feet activity ? ? ?Assist Walk 50 feet with 2 turns activity did not occur: Safety/medical concerns ? ?  ?   ? ? ?Walk 150 feet activity ? ? ?Assist Walk 150 feet activity did not occur: Safety/medical concerns ? ?  ?  ?  ? ?Walk 10 feet on uneven surface  ?activity ? ? ?Assist Walk 10 feet on uneven surfaces activity did not occur: Safety/medical concerns ? ? ?  ?   ? ?Wheelchair ? ? ? ? ?Assist Is the patient using a wheelchair?: Yes ?Type of Wheelchair: Manual ?  ? ?Wheelchair assist level: Dependent - Patient 0% ?Max wheelchair distance: 0  ? ? ?Wheelchair 50 feet with 2 turns activity ? ? ? ?Assist ? ?  ?  ? ? ?Assist Level: Dependent - Patient 0%  ? ?Wheelchair 150 feet activity  ? ? ? ?Assist ?   ? ? ?Assist Level: Dependent - Patient 0%  ? ?Blood pressure 136/69, pulse 80, temperature 98.5 ?F (36.9 ?C), resp. rate 16, height 5' (1.524 m), weight 126.3 kg, SpO2 95 %. ? ?Medical Problem List and Plan: ?1. Functional deficits secondary to left CR confluent infarct along with punctate left BG, thalamus and right cerebellum infarct as well as history of remote right cerebellar infarct  identified on CT imaging November 2020 ?            -patient may shower ?            -ELOS/Goals: 14-21 days MinA PT, S OT, S SLP ?            -HFU scheduled ? -Continue CIR therapies including PT, OT, and SLP  ? -Interdisciplinary Team Conference today   ?2.  Left lower extremity DVTs: Eliquis started.

## 2022-01-24 NOTE — Progress Notes (Signed)
Occupational Therapy Session Note ? ?Patient Details  ?Name: Samantha [name] ?MRN: [id number] ?Date of Birth: Jan 16, 1956 ? ?Today's Date: 01/24/2022 ?OT Missed Time: 75 Minutes ?Missed Time Reason: Patient fatigue ? ? ?Short Term Goals: ?Week 1:  OT Short Term Goal 1 (Week 1): Pt will complete donning/doffing shirt with mod assist ?OT Short Term Goal 2 (Week 1): Pt will donn/doff pants with mod assist ?OT Short Term Goal 3 (Week 1): Pt will complete stand pivot with mod assist ?OT Short Term Goal 4 (Week 1): Pt will complete toileting with max assist at [facility] level. ? ?Skilled Therapeutic Interventions/Progress Updates:  ?  Pt semi reclined, husband at bedside.  Pt reporting she did not sleep at all last night due to staff intermittently taking vitals and taking blood and also had a nose bleed last night.  Pt requesting to try and sleep during this session due to significant fatigue.  This therapist recommended to nurse that staff limit entering room as able this am to promote improved sleep for patient. Call bell in reach, bed alarm on. ? ?Therapy Documentation ?Precautions:  ?Precautions ?Precautions: Fall ?Precaution Comments: R hemi (RUE > RLE) ?Restrictions ?Weight Bearing Restrictions: No ? ? ? ?Therapy/Group: Individual Therapy ? ?Caryl Asp Khalil Szczepanik ?01/24/2022, 9:04 AM ?

## 2022-01-24 NOTE — Plan of Care (Signed)
?  Problem: Consults ?Goal: RH STROKE PATIENT EDUCATION ?Description: See Patient Education module for education specifics  ?Outcome: Progressing ?  ?Problem: RH BOWEL ELIMINATION ?Goal: RH STG MANAGE BOWEL WITH ASSISTANCE ?Description: STG Manage Bowel with mod I  Assistance. ?Outcome: Progressing ?Goal: RH STG MANAGE BOWEL W/MEDICATION W/ASSISTANCE ?Description: STG Manage Bowel with Medication with mod I  Assistance. ?Outcome: Progressing ?  ?Problem: RH SAFETY ?Goal: RH STG ADHERE TO SAFETY PRECAUTIONS W/ASSISTANCE/DEVICE ?Description: STG Adhere to Safety Precautions With cues Assistance/Device. ?Outcome: Progressing ?  ?Problem: RH KNOWLEDGE DEFICIT ?Goal: RH STG INCREASE KNOWLEDGE OF DIABETES ?Description: Patient and family will be able to manage DM with medications and dietary modifications using handouts and educational resources independently ?Outcome: Progressing ?Goal: RH STG INCREASE KNOWLEDGE OF HYPERTENSION ?Description: Patient and family will be able to manage HTN with medications and dietary modifications using handouts and educational resources independently ?Outcome: Progressing ?Goal: RH STG INCREASE KNOWLEGDE OF HYPERLIPIDEMIA ?Description: Patient and family will be able to manage HLD with medications and dietary modifications using handouts and educational resources independently ?Outcome: Progressing ?Goal: RH STG INCREASE KNOWLEDGE OF STROKE PROPHYLAXIS ?Description: Patient and family will be able to manage secondary stroke with medications and dietary modifications using handouts and educational resources independently ?Outcome: Progressing ?  ?

## 2022-01-24 NOTE — Progress Notes (Signed)
SLP Cancellation Note ? ?Patient Details ?Name: Samantha [name] ?MRN: [id number] ?DOB: June 16, 1956 ? ? ?Cancelled treatment:      Pt missed 60 minutes of skilled ST intervention due to fatigue; states she slept poorly the night before and had a nose bleed; MD aware.  ? ? ? ? ?Veleka Djordjevic A Evangelia Whitaker ?01/24/2022, 3:29 PM ?

## 2022-01-25 LAB — GLUCOSE, CAPILLARY
Glucose-Capillary: 114 mg/dL — ABNORMAL HIGH (ref 70–99)
Glucose-Capillary: 145 mg/dL — ABNORMAL HIGH (ref 70–99)
Glucose-Capillary: 243 mg/dL — ABNORMAL HIGH (ref 70–99)
Glucose-Capillary: 90 mg/dL (ref 70–99)

## 2022-01-25 LAB — D-DIMER, QUANTITATIVE: D-Dimer, Quant: 0.85 ug/mL-FEU — ABNORMAL HIGH (ref 0.00–0.50)

## 2022-01-25 MED ORDER — INSULIN GLARGINE-YFGN 100 UNIT/ML ~~LOC~~ SOLN
39.0000 [IU] | Freq: Every day | SUBCUTANEOUS | Status: DC
  Administered 2022-01-26 – 2022-01-27 (×2): 39 [IU] via SUBCUTANEOUS
  Filled 2022-01-25 (×3): qty 0.39

## 2022-01-25 MED ORDER — METOPROLOL TARTRATE 12.5 MG HALF TABLET
12.5000 mg | ORAL_TABLET | Freq: Two times a day (BID) | ORAL | Status: DC
  Administered 2022-01-25 – 2022-01-27 (×5): 12.5 mg via ORAL
  Filled 2022-01-25 (×5): qty 1

## 2022-01-25 NOTE — Progress Notes (Signed)
Speech Language Pathology Weekly Progress and Session Note ? ?Patient Details  ?Name: Clare Halls ?MRN: [id number] ?Date of Birth: Mar 18, 1956 ? ?Beginning of progress report period: January 18, 2022 ?End of progress report period: January 25, 2022 ? ?Today's Date: 01/25/2022 ?SLP Individual Time: 6269-4854 ?SLP Individual Time Calculation (min): 60 min ? ?Short Term Goals: ?Week 1: SLP Short Term Goal 1 (Week 1): Patient will recall new, daily information with use of external aids with Min verbal cues. ?SLP Short Term Goal 1 - Progress (Week 1): Not met ?SLP Short Term Goal 2 (Week 1): Patient will demosntrate functional problem solving for basic and familiar tasks with supervision level verbal cues. ?SLP Short Term Goal 2 - Progress (Week 1): Not met ?SLP Short Term Goal 3 (Week 1): Patient will attend to right field of enviornment/body during functional tasks with Min verbal cues. ?SLP Short Term Goal 3 - Progress (Week 1): Not met ?SLP Short Term Goal 4 (Week 1): Patient will maximize speech intelligibility to 90% at the sentence level with Min verbal cues for use speech intelligibility strategies. ?SLP Short Term Goal 4 - Progress (Week 1): Met ? ?  ?New Short Term Goals: ?Week 2: SLP Short Term Goal 1 (Week 2): To improve awareness and self-advocacy, patient will recall 3 pieces of information relevant to IPR admission given Min verbal for use of internal and/or external memory aids. ?SLP Short Term Goal 2 (Week 2): Pt will correctly place/organize 3 different medications in pill organizer given Mod verbal and visual A. ?SLP Short Term Goal 3 (Week 2): Patient will attend to right field of enviornment/body during functional tasks with Min verbal cues. ? ?Weekly Progress Updates: Pt has made minimal progress towards meeting short-term goals this week; only met 1 out of 4 goals. Barriers this week included fatigue due to poor sleep at night. Pt is motivated to participate in therapy and demonstrates carryover of new  skills and recall of novel information given Mod-Max A for use of compensatory memory strategies. Has met dysarthria goal. Verbal education provided re: ST POC and purpose of IPR to pt and spouse; they verbalized understanding, though nned reinforcement re: need for supervision/assistance upon d/c. Continue to recommend skilled ST intervention to address aforementioned cognitive-linguistic deficits and pt/family education in the setting of acute CVA. Please see updated goals. ?  ?Intensity: Minumum of 1-2 x/day, 30 to 90 minutes ?Frequency: 3 to 5 out of 7 days;1 to 3 out of 7 days ?Duration/Length of Stay: 3 weeks ?Treatment/Interventions: Cognitive remediation/compensation;Internal/external aids;Cueing hierarchy;Medication managment;Therapeutic Activities;Functional tasks;Patient/family education ? ? ?Daily Session ?Week 1: SLP Short Term Goal 1 (Week 1): Patient will recall new, daily information with use of external aids with Min verbal cues. - Following education of compensatory memory strategies and Max multimodal A for use of each strategy, pt recalled 5 out 5 novel facts re: treating clinician. Recalled purpose of PT, OT, and ST given Mod-Max verbal and visual A. ? ?SLP Short Term Goal 2 (Week 1): Patient will demosntrate functional problem solving for basic and familiar tasks with supervision level verbal cues. - Demonstrated functional problem-solving with remote given Min A; previous session pt required Max A to utilize remote. ? ?SLP Short Term Goal 3 (Week 1): Patient will attend to right field of enviornment/body during functional tasks with Min verbal cues. - Did not formally address this session. ? ?SLP Short Term Goal 4 (Week 1): Patient will maximize speech intelligibility to 90% at the sentence level with Min verbal cues  for use speech intelligibility strategies. - GOAL MET ? ?Skilled Therapeutic Interventions: Pt seen this date for skilled ST intervention targeting cognitive-linguistic goals  outlined above. Pt encountered alert/awake and OOB in w/c with chair alarm on. Pt agreeable to ST intervention at bedside.  ? ?SLP provided skilled education re: compensatory memory strategies and external aids to promote recall of novel information, as well as education re: difference between long-term and short-term memory and deficits that may occur as a result of stroke. When given Max multimodal cueing, pt utilized 3 out of 4 memory strategies to recall 5 novel facts about treating clinician and purpose of 2 out of 3 therapies without cues for recall. Benefited from use of calendar to become oriented to date; oriented to self, location, and situation without assistance. Please see above for objective data re: pt's performance during today's session. ? ?Session concluded with pt in w/c, chair alarm on, call bell reviewed + within reach, and all immediate needs met. Continue per updated ST POC next session.   ? ?Pain ?Denies pain; NAD ? ?Therapy/Group: Individual Therapy ? ?Kipton Skillen A Kevyn Boquet ?01/25/2022, 1:27 PM ? ? ? ? ? ? ?

## 2022-01-25 NOTE — Progress Notes (Signed)
Occupational Therapy Weekly Progress Note ? ?Patient Details  ?Name: Samantha [name] ?MRN: [id number] ?Date of Birth: [birth year] ? ?Beginning of progress report period: January 18, 2022 ?End of progress report period: January 25, 2022 ? ?Today's Date: 01/25/2022 ?OT Individual Time: 0177-9390 ?OT Individual Time Calculation (min): 70 min  ? ? ?Patient has met 1 of 4 short term goals.  Pt is progressing slowly towards goals but is consistently driven to participate with OT to make gains.  Pts biggest barrier is her limited standing endurance and slow motor return of RUE and RLE.  Pt also presents with urge incontinence requiring intermittent bed level toileting during therapy sessions.  Pt has moderate right sided neglect as well which hinders motor planning and functional use of right dominant side.  Pt currently requires mod assist for UB dressing which is improved from max assist at initial evaluation.  Pt requires max to total assist for LB bathing and dressing at stedy or at bed level.  Pt will benefit from further training on hemi techniques to increase independence with self care, endurance training to improve standing tolerance for functional transfers, and neuro re-ed of right extremities to maximize functional return. Pt has supportive family who participate regularly to facilitate pts recovery.   ? ?Patient continues to demonstrate the following deficits: muscle weakness and muscle paralysis, decreased cardiorespiratoy endurance, abnormal tone, unbalanced muscle activation, motor apraxia, decreased coordination, and decreased motor planning, right side neglect, decreased initiation, decreased attention, decreased awareness, decreased problem solving, decreased memory, and delayed processing, and decreased sitting balance, decreased standing balance, decreased postural control, hemiplegia, and decreased balance strategies and therefore will continue to benefit from skilled OT intervention to enhance overall  performance with BADL. ? ?Patient progressing toward long term goals..  Continue plan of care. ? ?OT Short Term Goals ?Week 1:  OT Short Term Goal 1 (Week 1): Pt will complete donning/doffing shirt with mod assist ?OT Short Term Goal 1 - Progress (Week 1): Met ?OT Short Term Goal 2 (Week 1): Pt will donn/doff pants with mod assist ?OT Short Term Goal 2 - Progress (Week 1): Progressing toward goal ?OT Short Term Goal 3 (Week 1): Pt will complete stand pivot with mod assist ?OT Short Term Goal 3 - Progress (Week 1): Progressing toward goal ?OT Short Term Goal 4 (Week 1): Pt will complete toileting with max assist at [facility] level. ?OT Short Term Goal 4 - Progress (Week 1): Progressing toward goal ?Week 2:  OT Short Term Goal 1 (Week 2): Pt will donn/doff pants with mod assist ?OT Short Term Goal 2 (Week 2): Pt will complete stand pivot with mod assist ?OT Short Term Goal 3 (Week 2): Pt will complete toileting with max assist at [facility] level. ?OT Short Term Goal 4 (Week 2): Pt will bathe UB/LB using long handled sponge with mod assist. ? ?Skilled Therapeutic Interventions/Progress Updates:  ?  Pt semi reclined in bed, reporting incontinence of urine and requesting to be changed. Also agreeable to dressing and bathing UB at sink after.  Pt required mod assist to roll to left and min assist to roll to right for dependent toileting at bed level.  Pt then completed right sidelying to sit using bed features with mod assist and step by step cues for body mechanics and sequencing.  Pt able to maintain static balance with supervision.  Right lateral scoot with mod assist in preperation for transfer to w/c.  Squat pivot with mod assist +2 with dyspnea present after transfer.  Pt doffed shirt at sink with verbal cues for hemi technique follow through and to attend to RUE when threading sleeve off.  Pt washed UB needing max assist and manual support of RUE when washing under arm and then hand over hand provided to right when washing  LUE.  Pt donned bra and shirt with mod assist with cues again for hemi technique training.  Pt brushed teeth with training on hemi technique with supervision overall.  Right arm trough placed to support RUE, call bell in reach, seat belt alarm on.  Applied unattend NMES to right wrist extensors to promote functional return of grasp on following settings: ?Ratio 1:3 ?Rate 35 pps ?Waveform- Asymmetric ?Ramp 2.0 ?Pulse 300 ?Intensity- 24 ?Duration -   20 minutes ? ?Report of pain at the beginning of session : none ?Report of pain at the end of session: none ? ?No adverse reactions after treatment and is skin intact.   ? ?Therapy Documentation ?Precautions:  ?Precautions ?Precautions: Fall ?Precaution Comments: R hemi (RUE > RLE) ?Restrictions ?Weight Bearing Restrictions: No ? ? ? ?Therapy/Group: Individual Therapy ? ?Caryl Asp Edouard Gikas ?01/25/2022, 1:25 PM  ?

## 2022-01-25 NOTE — Progress Notes (Addendum)
?                                                       PROGRESS NOTE ? ? ?Subjective/Complaints: ?Slept better last night ?Discussed that CBGs have been stable with metformin and lower dose of insulin. She thinks that in the past she may have had an allergic reaction to metformin ? ? ?ROS: Patient denies fever, rash, sore throat, blurred vision, dizziness, nausea, vomiting, diarrhea, cough, shortness of breath or chest pain, joint or back/neck pain, headache, or mood change. +constipation, rash resolved, insomnia improved ? ? ?Objective: ?  ?No results found. ?Recent Labs  ?  01/23/22 ?0604  ?WBC 7.8  ?HGB 11.0*  ?HCT 34.1*  ?PLT 196  ? ? ?Recent Labs  ?  01/24/22 ?0518  ?NA 137  ?K 4.6  ?CL 106  ?CO2 24  ?GLUCOSE 101*  ?BUN 21  ?CREATININE 0.94  ?CALCIUM 8.9  ? ? ?Intake/Output Summary (Last 24 hours) at 01/25/2022 1730 ?Last data filed at 01/25/2022 1426 ?Gross per 24 hour  ?Intake 600 ml  ?Output --  ?Net 600 ml  ?  ? ?  ? ?Physical Exam: ?Vital Signs ?Blood pressure (!) 170/76, pulse (!) 109, temperature 98.1 ?F (36.7 ?C), resp. rate 19, height 5' (1.524 m), weight 129.7 kg, SpO2 100 %. ?Constitutional: No distress . Vital signs reviewed. Obese, BMI 54.38 ?HEENT: NCAT, EOMI, oral membranes moist ?Neck: supple ?Cardiovascular: Tachycardic ?Respiratory/Chest: CTA Bilaterally without wheezes or rales. Normal effort    ?GI/Abdomen: BS +, non-tender, non-distended ?Ext: LLE swelling, no tenderness to palpation ?Psych: pleasant and cooperative  ?Skin: intact ?Neurological:  ?   Comments: Patient is alert.  No acute distress.  Makes eye contact with examiner.  Oriented to person and place.  Fair awareness of deficits. RUE remains 0/5, RLE 1-2/5, Left side 5/5. Sensation intact. ? ? ?Assessment/Plan: ?1. Functional deficits which require 3+ hours per day of interdisciplinary therapy in a comprehensive inpatient rehab setting. ?Physiatrist is providing close team supervision and 24 hour management of active medical  problems listed below. ?Physiatrist and rehab team continue to assess barriers to discharge/monitor patient progress toward functional and medical goals ? ?Care Tool: ? ?Bathing ?   ?Body parts bathed by patient: Buttocks, Front perineal area, Right upper leg, Left upper leg  ?   ?  ?  ?Bathing assist Assist Level: Total Assistance - Patient < 25% ?  ?  ?Upper Body Dressing/Undressing ?Upper body dressing   ?What is the patient wearing?: Hospital gown only ?   ?Upper body assist Assist Level: Total Assistance - Patient < 25% ?   ?Lower Body Dressing/Undressing ?Lower body dressing ? ? ?   ?What is the patient wearing?: Hospital gown only, Incontinence brief ? ?  ? ?Lower body assist Assist for lower body dressing: Total Assistance - Patient < 25% ?   ? ?Toileting ?Toileting    ?Toileting assist Assist for toileting: Maximal Assistance - Patient 25 - 49% ?  ?  ?Transfers ?Chair/bed transfer ? ?Transfers assist ? Chair/bed transfer activity did not occur: Safety/medical concerns ? ?Chair/bed transfer assist level: Dependent - mechanical lift (stedy) ?  ?  ?Locomotion ?Ambulation ? ? ?Ambulation assist ? ? Ambulation activity did not occur: Safety/medical concerns ? ?  ?  ?   ? ?Walk 10 feet  activity ? ? ?Assist ? Walk 10 feet activity did not occur: Safety/medical concerns ? ?  ?   ? ?Walk 50 feet activity ? ? ?Assist Walk 50 feet with 2 turns activity did not occur: Safety/medical concerns ? ?  ?   ? ? ?Walk 150 feet activity ? ? ?Assist Walk 150 feet activity did not occur: Safety/medical concerns ? ?  ?  ?  ? ?Walk 10 feet on uneven surface  ?activity ? ? ?Assist Walk 10 feet on uneven surfaces activity did not occur: Safety/medical concerns ? ? ?  ?   ? ?Wheelchair ? ? ? ? ?Assist Is the patient using a wheelchair?: Yes ?Type of Wheelchair: Manual ?  ? ?Wheelchair assist level: Dependent - Patient 0% ?Max wheelchair distance: 0  ? ? ?Wheelchair 50 feet with 2 turns activity ? ? ? ?Assist ? ?  ?  ? ? ?Assist Level:  Dependent - Patient 0%  ? ?Wheelchair 150 feet activity  ? ? ? ?Assist ?   ? ? ?Assist Level: Dependent - Patient 0%  ? ?Blood pressure (!) 170/76, pulse (!) 109, temperature 98.1 ?F (36.7 ?C), resp. rate 19, height 5' (1.524 m), weight 129.7 kg, SpO2 100 %. ? ?Medical Problem List and Plan: ?1. Functional deficits secondary to left CR confluent infarct along with punctate left BG, thalamus and right cerebellum infarct as well as history of remote right cerebellar infarct identified on CT imaging November 2020 ?            -patient may shower ?            -ELOS/Goals: 14-21 days MinA PT, S OT, S SLP ?            -HFU scheduled ? -Continue CIR therapies including PT, OT, and SLP    ?2.  Left lower extremity DVTs: Eliquis started. Discussed cost with patient and wife ?            -antiplatelet therapy: Aspirin 325 mg daily and Plavix 75 mg daily x3 months then aspirin alone ?3. Pain Management: N/A ?4. Mood: Provide emotional support ?            -antipsychotic agents: N/A ?5. Neuropsych: This patient is capable of making decisions on her own behalf. ?6. Skin/Wound Care: Routine skin checks ?7. Fluids/Electrolytes/Nutrition:   ? -appears to have good appetite, eating  100% ?8.  Diabetes mellitus.  Hemoglobin A1c 13.1.  Insulin therapy as directed with diabetic teaching. Educated regarding avoiding foods with added sugar.  ? CBG (last 3)  ?Recent Labs  ?  01/25/22 ?0646 01/25/22 ?1145 01/25/22 ?1704  ?GLUCAP 114* 145* 243*  ?  3/28:Start metformin 500mg  daily and discontinue Novolog with meals.  ? 3/29: increase metformin to 500mg  BID and decrease insulin to 35U.  ? 3/30: discussed continuing metformin current dose to assess for allergy (patient stated happened long time ago), and increasing insulin dose by 4U and patient is agreeable.  ?9.  Hypertension. Start Metoprolol 12.5mg  BID Continue Norvasc 10 mg daily.  Monitor with increased mobility. Increase magnesium gluconate to 500mg  HS.  ?10.  Hyperlipidemia.   Lipitor ?11.  Morbid obesity.  BMI greater than 56. Continue heart healthy/carb modified diet ?12. Vitamin D deficiency: start ergocalciferol 50,000U once per week for 7 weeks ?13. Insomnia: resolved with melatonin 3mg  HS, will change to prn. Placed sign on door not to wake before 5am.  ?14. AKI: improving, place nursing order to encourage 6-8 glasses of water  per day, repeat creatinine tomorrow.  ?15. Rash on posterior legs: hypoallergenic sheets and hydrocortisone cream ordered, resolved, hydrocortisone changed to prn ?16. Tachycardia: start lopressor 12.5mg  BID.  ? ? ? ?LOS: ?8 days ?A FACE TO FACE EVALUATION WAS PERFORMED ? ?Clint BolderKrutika P Ott Zimmerle ?01/25/2022, 5:30 PM  ? ?  ?

## 2022-01-25 NOTE — Progress Notes (Signed)
Physical Therapy Weekly Progress Note ? ?Patient Details  ?Name: Samantha [name] ?MRN: [id number] ?Date of Birth: August 29, 1956 ? ?Beginning of progress report period: January 18, 2022 ?End of progress report period: January 25, 2022 ? ?Today's Date: 01/25/2022 ?PT Individual Time: 5631-4970 ?PT Individual Time Calculation (min): 57 min  ? ?Patient has met 1 of 4 short term goals. Pt is making slow progress towards her goals but she remains motivated during therapy sessions. Overall, she requires maxA +2 for bed mobility, CGA for sitting balance, maxA +2 for sit<>stand transfers, modA +2 for bed<>chair transfers using the [place], and has ambulated ~67f using LiteGait overground with +2 assist. She is primarily limited by global deconditioning and very poor endurance, R sided weakness (RUE > RLE), decreased sensation/proprioception in R side, poor standing tolerance (~1 minute), and cognitive deficits.  ? ?Patient continues to demonstrate the following deficits muscle weakness and muscle joint tightness, decreased cardiorespiratoy endurance, abnormal tone, unbalanced muscle activation, motor apraxia, decreased coordination, and decreased motor planning, decreased attention to right and decreased motor planning, decreased attention, decreased awareness, decreased problem solving, decreased safety awareness, decreased memory, and delayed processing, and decreased sitting balance, decreased standing balance, decreased postural control, hemiplegia, and decreased balance strategies and therefore will continue to benefit from skilled PT intervention to increase functional independence with mobility. ? ?Patient progressing toward long term goals.  Continue plan of care. ? ?PT Short Term Goals ?Week 1:  PT Short Term Goal 1 (Week 1): Pt will complete bed mobility with maxA of 1 person ?PT Short Term Goal 1 - Progress (Week 1): Not met ?PT Short Term Goal 2 (Week 1): Pt will complete bed<>chair transfer with maxA and LRAD ?PT Short Term  Goal 2 - Progress (Week 1): Not met ?PT Short Term Goal 3 (Week 1): Pt will tolerate sitting in w/c >2 hours outside of therapy sessions ?PT Short Term Goal 3 - Progress (Week 1): Met ?PT Short Term Goal 4 (Week 1): pt will ambulate 151fwith maxA and LRAD ?PT Short Term Goal 4 - Progress (Week 1): Not met ?Week 2:  PT Short Term Goal 1 (Week 2): Pt complete bed mobility with maxA of 1 person ?PT Short Term Goal 2 (Week 2): Pt will complete bed<>chair transfers with maxA of 1 person ?PT Short Term Goal 3 (Week 2): Pt will tolerate static standing for 3 minutes to prepare for functional mobility training ?PT Short Term Goal 4 (Week 2): Pt will ambulate 1011fith maxA of 1 person ? ?Skilled Therapeutic Interventions/Progress Updates:  ?   ? ?Pt sitting on BSC to start session with NT assisting. Pt requiring +2 modA to stand in the [place] dependant assist for brief change. [facility] with difficult [facility] to body habitus and would benefit from a bariatric BSC rather than standard. Pt able to sit in perched position with CGA while being transferred to w/c and then transported to main rehab gym where she was wheeled inside // bars. In // bars, we worked on standing tolerance, RLE NMR, global endurance and activity tolerance. Completed 3x5 sit<>stands with minA with her using her LUE to pull from // bar. We also worked on lateral weight shifts, R TKE 2x15, and standing tolerance (3x90seconds) in bars. Pt is very quick to fatigue however this is improving daily. We attempted to work on pre-gait training with forward/backward stepping with her weaker R leg but pt unable to initiate adequate hip flexor activation to clear foot off of ground.  Pt returned to room at end of session where she remained seated in w/c, safety belt alarm on, LUE in trough, all needs in reach.  ? ?Therapy Documentation ?Precautions:  ?Precautions ?Precautions: Fall ?Precaution Comments: R hemi (RUE > RLE) ?Restrictions ?Weight Bearing  Restrictions: No ?General: ?  ? ?Therapy/Group: Individual Therapy ? ?Alger Simons PT ?01/25/2022, 7:19 AM  ?

## 2022-01-26 DIAGNOSIS — I639 Cerebral infarction, unspecified: Secondary | ICD-10-CM

## 2022-01-26 LAB — GLUCOSE, CAPILLARY
Glucose-Capillary: 93 mg/dL (ref 70–99)
Glucose-Capillary: 107 mg/dL — ABNORMAL HIGH (ref 70–99)
Glucose-Capillary: 63 mg/dL — ABNORMAL LOW (ref 70–99)
Glucose-Capillary: 102 mg/dL — ABNORMAL HIGH (ref 70–99)
Glucose-Capillary: 133 mg/dL — ABNORMAL HIGH (ref 70–99)

## 2022-01-26 NOTE — Progress Notes (Signed)
?                                                       PROGRESS NOTE ? ? ?Subjective/Complaints: ? ?No issues overnite except freq BMs , only 1 recorded yesterday pm, no abd pain  ? ?ROS: Patient deniesCP, SOB, N/V/D ? ? ?Objective: ?  ?No results found. ?No results for input(s): WBC, HGB, HCT, PLT in the last 72 hours. ? ? ?Recent Labs  ?  01/24/22 ?0518  ?NA 137  ?K 4.6  ?CL 106  ?CO2 24  ?GLUCOSE 101*  ?BUN 21  ?CREATININE 0.94  ?CALCIUM 8.9  ? ? ? ?Intake/Output Summary (Last 24 hours) at 01/26/2022 0757 ?Last data filed at 01/25/2022 1426 ?Gross per 24 hour  ?Intake 360 ml  ?Output --  ?Net 360 ml  ? ?  ? ?  ? ?Physical Exam: ?Vital Signs ?Blood pressure (!) 134/59, pulse 70, temperature 98.6 ?F (37 ?C), temperature source Oral, resp. rate 16, height 5' (1.524 m), weight 129.7 kg, SpO2 100 %. ? ?General: No acute distress ?Mood and affect are appropriate ?Heart: Regular rate and rhythm no rubs murmurs or extra sounds ?Lungs: Clear to auscultation, breathing unlabored, no rales or wheezes ?Abdomen: Positive bowel sounds, soft nontender to palpation, nondistended ?Extremities: No clubbing, cyanosis, or edema ?Skin: No evidence of breakdown, no evidence of rash ? ? ?Psych: pleasant and cooperative  ?Skin: intact ?Neurological:  ?   Comments: Patient is alert.  No acute distress.  Makes eye contact with examiner.  Oriented to person and place.  Fair awareness of deficits. RUE remains 0/5, RLE 1-2/5, Left side 5/5. Sensation intact. ? ? ?Assessment/Plan: ?1. Functional deficits which require 3+ hours per day of interdisciplinary therapy in a comprehensive inpatient rehab setting. ?Physiatrist is providing close team supervision and 24 hour management of active medical problems listed below. ?Physiatrist and rehab team continue to assess barriers to discharge/monitor patient progress toward functional and medical goals ? ?Care Tool: ? ?Bathing ?   ?Body parts bathed by patient: Buttocks, Front perineal area, Right  upper leg, Left upper leg  ?   ?  ?  ?Bathing assist Assist Level: Total Assistance - Patient < 25% ?  ?  ?Upper Body Dressing/Undressing ?Upper body dressing   ?What is the patient wearing?: Hospital gown only ?   ?Upper body assist Assist Level: Total Assistance - Patient < 25% ?   ?Lower Body Dressing/Undressing ?Lower body dressing ? ? ?   ?What is the patient wearing?: Hospital gown only, Incontinence brief ? ?  ? ?Lower body assist Assist for lower body dressing: Total Assistance - Patient < 25% ?   ? ?Toileting ?Toileting    ?Toileting assist Assist for toileting: Maximal Assistance - Patient 25 - 49% ?  ?  ?Transfers ?Chair/bed transfer ? ?Transfers assist ? Chair/bed transfer activity did not occur: Safety/medical concerns ? ?Chair/bed transfer assist level: Dependent - mechanical lift (stedy) ?  ?  ?Locomotion ?Ambulation ? ? ?Ambulation assist ? ? Ambulation activity did not occur: Safety/medical concerns ? ?  ?  ?   ? ?Walk 10 feet activity ? ? ?Assist ? Walk 10 feet activity did not occur: Safety/medical concerns ? ?  ?   ? ?Walk 50 feet activity ? ? ?Assist Walk 50 feet with 2 turns  activity did not occur: Safety/medical concerns ? ?  ?   ? ? ?Walk 150 feet activity ? ? ?Assist Walk 150 feet activity did not occur: Safety/medical concerns ? ?  ?  ?  ? ?Walk 10 feet on uneven surface  ?activity ? ? ?Assist Walk 10 feet on uneven surfaces activity did not occur: Safety/medical concerns ? ? ?  ?   ? ?Wheelchair ? ? ? ? ?Assist Is the patient using a wheelchair?: Yes ?Type of Wheelchair: Manual ?  ? ?Wheelchair assist level: Dependent - Patient 0% ?Max wheelchair distance: 0  ? ? ?Wheelchair 50 feet with 2 turns activity ? ? ? ?Assist ? ?  ?  ? ? ?Assist Level: Dependent - Patient 0%  ? ?Wheelchair 150 feet activity  ? ? ? ?Assist ?   ? ? ?Assist Level: Dependent - Patient 0%  ? ?Blood pressure (!) 134/59, pulse 70, temperature 98.6 ?F (37 ?C), temperature source Oral, resp. rate 16, height 5' (1.524 m),  weight 129.7 kg, SpO2 100 %. ? ?Medical Problem List and Plan: ?1. 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            -patient may shower ?            -ELOS/Goals: 14-21 days MinA PT, S OT, S SLP ?            -HFU scheduled ? -Continue CIR therapies including PT, OT, and SLP    ?2.  Left lower extremity DVTs: Eliquis started. Discussed cost with patient and wife ?            -antiplatelet therapy: Aspirin 325 mg daily and Plavix 75 mg daily x3 months then aspirin alone ?3. Pain Management: N/A ?4. Mood: Provide emotional support ?            -antipsychotic agents: N/A ?5. Neuropsych: This patient is capable of making decisions on her own behalf. ?6. Skin/Wound Care: Routine skin checks ?7. Fluids/Electrolytes/Nutrition:   ? -appears to have good appetite, eating  100% ?8.  Diabetes mellitus.  Hemoglobin A1c 13.1.  Insulin therapy as directed with diabetic teaching. Educated regarding avoiding foods with added sugar.  ? CBG (last 3)  ?Recent Labs  ?  01/25/22 ?1704 01/25/22 ?2258 01/26/22 ?0618  ?GLUCAP 243* 90 93  ? ? ? 3/29: increase metformin to 500mg  BID and decrease insulin to 35U.  ? 3/30: discussed continuing metformin current dose to assess for allergy (patient stated happened long time ago), and increasing insulin dose by 4U and patient is agreeable.  ?Pm elevation will monitor with current changes  ?9.  Hypertension. Start Metoprolol 12.5mg  BID Continue Norvasc 10 mg daily.  Monitor with increased mobility. Increase magnesium gluconate to 500mg  HS.  ?Vitals:  ? 01/25/22 1308 01/26/22 0004  ?BP: (!) 170/76 (!) 134/59  ?Pulse: (!) 109 70  ?Resp: 19 16  ?Temp: 98.1 ?F (36.7 ?C) 98.6 ?F (37 ?C)  ?SpO2: 100% 100%  ?Some lability monitor with mobilization  ? ?10.  Hyperlipidemia.  Lipitor ?11.  Morbid obesity.  BMI greater  than 56. Continue heart healthy/carb modified diet ?12. Vitamin D deficiency: start ergocalciferol 50,000U once per week for 7 weeks ?13. Insomnia: resolved with melatonin 3mg  HS, will change to prn. Placed sign on door not to wake before 5am.  ?14.  AKI: improving, place nursing order to encourage 6-8 glasses of water per day, repeat creatinine tomorrow.  ?15. Rash on posterior legs: hypoallergenic sheets and hydrocortisone cream ordered, resolved, hydrocortisone changed to prn ?16. Tachycardia: start lopressor 12.5mg  BID on 3/30. - monitor HR ?17.  Pt c/o abd cramping freq BM will hold Mg++ and monitor  ? ? ?LOS: ?9 days ?A FACE TO FACE EVALUATION WAS PERFORMED ? ?Victorino Sparrowndrew E Sajad Glander ?01/26/2022, 7:57 AM  ? ?  ?

## 2022-01-26 NOTE — Progress Notes (Signed)
Hypoglycemic Event ? ?CBG: 63 ? ?Treatment: 4 oz juice/soda ? ?Symptoms: None ? ?Follow-up CBG: Time:1804 CBG Result:102 ? ?Possible Reasons for Event: Inadequate meal intake ? ?Comments/MD notified:Patient returned to baseline after one intervention ? ? ? ?Sharesa Kemp L Shizuko Wojdyla ? ? ?

## 2022-01-26 NOTE — Progress Notes (Signed)
Occupational Therapy Session Note ? ?Patient Details  ?Name: Samantha [name] ?MRN: [id number] ?Date of Birth: 08/15/1956 ? ?Today's Date: 01/26/2022 ?OT Missed Time: 75 Minutes ?Missed Time Reason: Patient fatigue ? ? ?Short Term Goals: ?Week 2:  OT Short Term Goal 1 (Week 2): Pt will donn/doff pants with mod assist ?OT Short Term Goal 2 (Week 2): Pt will complete stand pivot with mod assist ?OT Short Term Goal 3 (Week 2): Pt will complete toileting with max assist at [facility] level. ?OT Short Term Goal 4 (Week 2): Pt will bathe UB/LB using long handled sponge with mod assist. ? ? ?Skilled Therapeutic Interventions/Progress Updates:  ?  Pt semi reclined in bed, reporting no pain but tired due to recent diarrhea the night before and pt reports feeling like she might need to have another bowel movement soon.  Therapist encouraged pt to attempt toileting at [facility] level however pt politely declining stating she doesn't feel like she needs to go just yet but she is worried moving around will cause incontinence and politely declining to participate in OT session at this time.  75 minutes missed treatment. ? ?Therapy Documentation ?Precautions:  ?Precautions ?Precautions: Fall ?Precaution Comments: R hemi (RUE > RLE) ?Restrictions ?Weight Bearing Restrictions: No ? ? ? ?Therapy/Group: Individual Therapy ? ?Samantha [name] ?01/26/2022, 12:23 PM ?

## 2022-01-26 NOTE — Progress Notes (Signed)
Physical Therapy Session Note ? ?Patient Details  ?Name: Nydia Aristizabal ?MRN: [id number] ?Date of Birth: [birth year] ? ?Today's Date: 01/26/2022 ?PT Missed Time: 75 Minutes + 30 minutes ?Missed Time Reason: Patient fatigue;Patient unwilling to participate ? ?1st session: ?Pt supine in bed - politely refusing therapy today due to upset stomach and diarrhea. She missed 75 minutes of therapy.  ? ?2nd session: ?Pt continuing to refuse due to upset stomach and feeling unwell. Unable to encourage her to participate in bed level there-ex. She missed 30 minutes of therapy.  ? ? ?Therapy/Group: Individual Therapy ? ?Deadra Diggins P Bryden Darden ?01/26/2022, 7:29 AM  ?

## 2022-01-27 ENCOUNTER — Inpatient Hospital Stay (HOSPITAL_COMMUNITY): Payer: 59

## 2022-01-27 LAB — GLUCOSE, CAPILLARY
Glucose-Capillary: 119 mg/dL — ABNORMAL HIGH (ref 70–99)
Glucose-Capillary: 133 mg/dL — ABNORMAL HIGH (ref 70–99)
Glucose-Capillary: 111 mg/dL — ABNORMAL HIGH (ref 70–99)
Glucose-Capillary: 54 mg/dL — ABNORMAL LOW (ref 70–99)
Glucose-Capillary: 78 mg/dL (ref 70–99)

## 2022-01-27 NOTE — Progress Notes (Signed)
Speech Language Pathology Daily Session Note ? ?Patient Details  ?Name: Samantha [name] ?MRN: [id number] ?Date of Birth: [birth year] ? ?Today's Date: 01/27/2022 ?SLP Individual Time: [id number] ?SLP Individual Time Calculation (min): 43 min ? ?Short Term Goals: ?Week 2: SLP Short Term Goal 1 (Week 2): To improve awareness and self-advocacy, patient will recall 3 pieces of information relevant to IPR admission given Min verbal for use of internal and/or external memory aids. ?SLP Short Term Goal 2 (Week 2): Pt will correctly place/organize 3 different medications in pill organizer given Mod verbal and visual A. ?SLP Short Term Goal 3 (Week 2): Patient will attend to right field of enviornment/body during functional tasks with Min verbal cues. ? ?Skilled Therapeutic Interventions: ?Pt seen for skilled ST with focus on cognitive goals, pt in bed and reporting spasms in bowels that is 8/10 pain, nursing aware. SLP facilitating functional recall by providing initial cue for patient to locate and utilize memory notebook, patient using with Supervision A cues to answer questions regarding CIR environment. Pt wanting to target word finding difficulties, benefiting from initial Supervision A cues for word finding/sustained attention to task. At this point, pt reports bowel spasm and pain which appeared to significantly impact focus/attention. Pt requiring mod-max A cues for remainder of activity. Pt left in bed with alarm set and all needs within reach. Cont ST POC.  ? ?Pain ?Pain Assessment ?Pain Scale: 0-10 ?Pain Score: 8  ?Pain Location: Abdomen ? ?Therapy/Group: Individual Therapy ? ?Dewaine Conger ?01/27/2022, 9:47 AM ?

## 2022-01-27 NOTE — Plan of Care (Signed)
?  Problem: Consults ?Goal: RH STROKE PATIENT EDUCATION ?Description: See Patient Education module for education specifics  ?Outcome: Progressing ?  ?Problem: RH BOWEL ELIMINATION ?Goal: RH STG MANAGE BOWEL WITH ASSISTANCE ?Description: STG Manage Bowel with mod I  Assistance. ?Outcome: Progressing ?Goal: RH STG MANAGE BOWEL W/MEDICATION W/ASSISTANCE ?Description: STG Manage Bowel with Medication with mod I  Assistance. ?Outcome: Progressing ?  ?Problem: RH SAFETY ?Goal: RH STG ADHERE TO SAFETY PRECAUTIONS W/ASSISTANCE/DEVICE ?Description: STG Adhere to Safety Precautions With cues Assistance/Device. ?Outcome: Progressing ?  ?Problem: RH KNOWLEDGE DEFICIT ?Goal: RH STG INCREASE KNOWLEDGE OF DIABETES ?Description: Patient and family will be able to manage DM with medications and dietary modifications using handouts and educational resources independently ?Outcome: Progressing ?Goal: RH STG INCREASE KNOWLEDGE OF HYPERTENSION ?Description: Patient and family will be able to manage HTN with medications and dietary modifications using handouts and educational resources independently ?Outcome: Progressing ?Goal: RH STG INCREASE KNOWLEGDE OF HYPERLIPIDEMIA ?Description: Patient and family will be able to manage HLD with medications and dietary modifications using handouts and educational resources independently ?Outcome: Progressing ?Goal: RH STG INCREASE KNOWLEDGE OF STROKE PROPHYLAXIS ?Description: Patient and family will be able to manage secondary stroke with medications and dietary modifications using handouts and educational resources independently ?Outcome: Progressing ?  ?

## 2022-01-28 LAB — URINALYSIS, ROUTINE W REFLEX MICROSCOPIC
Bilirubin Urine: NEGATIVE
Glucose, UA: NEGATIVE mg/dL
Ketones, ur: NEGATIVE mg/dL
Nitrite: POSITIVE — AB
Protein, ur: 100 mg/dL — AB
RBC / HPF: >50 RBC/hpf — ABNORMAL HIGH (ref 0–5)
Specific Gravity, Urine: 1.011 (ref 1.005–1.030)
WBC, UA: >50 WBC/hpf — ABNORMAL HIGH (ref 0–5)
pH: 5 (ref 5.0–8.0)

## 2022-01-28 LAB — GLUCOSE, CAPILLARY
Glucose-Capillary: 122 mg/dL — ABNORMAL HIGH (ref 70–99)
Glucose-Capillary: 182 mg/dL — ABNORMAL HIGH (ref 70–99)
Glucose-Capillary: 174 mg/dL — ABNORMAL HIGH (ref 70–99)
Glucose-Capillary: 173 mg/dL — ABNORMAL HIGH (ref 70–99)

## 2022-01-28 MED ORDER — INSULIN GLARGINE-YFGN 100 UNIT/ML ~~LOC~~ SOLN
35.0000 [IU] | Freq: Every day | SUBCUTANEOUS | Status: DC
  Administered 2022-01-29 – 2022-02-12 (×15): 35 [IU] via SUBCUTANEOUS
  Filled 2022-01-28 (×15): qty 0.35

## 2022-01-28 MED ORDER — SULFAMETHOXAZOLE-TRIMETHOPRIM 800-160 MG PO TABS
1.0000 | ORAL_TABLET | Freq: Two times a day (BID) | ORAL | Status: DC
Start: 2022-01-28 — End: 2022-01-31
  Administered 2022-01-28 – 2022-01-31 (×7): 1 via ORAL
  Filled 2022-01-28 (×7): qty 1

## 2022-01-28 MED ORDER — WITCH HAZEL-GLYCERIN EX PADS
MEDICATED_PAD | CUTANEOUS | Status: DC | PRN
  Filled 2022-01-28: qty 100

## 2022-01-28 MED ORDER — METOPROLOL TARTRATE 25 MG PO TABS
25.0000 mg | ORAL_TABLET | Freq: Two times a day (BID) | ORAL | Status: DC
  Administered 2022-01-28 – 2022-02-01 (×9): 25 mg via ORAL
  Filled 2022-01-28 (×9): qty 1

## 2022-01-28 NOTE — Plan of Care (Signed)
?  Problem: Consults ?Goal: RH STROKE PATIENT EDUCATION ?Description: See Patient Education module for education specifics  ?Outcome: Progressing ?  ?Problem: RH BOWEL ELIMINATION ?Goal: RH STG MANAGE BOWEL WITH ASSISTANCE ?Description: STG Manage Bowel with mod I  Assistance. ?Outcome: Progressing ?Goal: RH STG MANAGE BOWEL W/MEDICATION W/ASSISTANCE ?Description: STG Manage Bowel with Medication with mod I  Assistance. ?Outcome: Progressing ?  ?Problem: RH SAFETY ?Goal: RH STG ADHERE TO SAFETY PRECAUTIONS W/ASSISTANCE/DEVICE ?Description: STG Adhere to Safety Precautions With cues Assistance/Device. ?Outcome: Progressing ?  ?Problem: RH KNOWLEDGE DEFICIT ?Goal: RH STG INCREASE KNOWLEDGE OF DIABETES ?Description: Patient and family will be able to manage DM with medications and dietary modifications using handouts and educational resources independently ?Outcome: Progressing ?Goal: RH STG INCREASE KNOWLEDGE OF HYPERTENSION ?Description: Patient and family will be able to manage HTN with medications and dietary modifications using handouts and educational resources independently ?Outcome: Progressing ?Goal: RH STG INCREASE KNOWLEGDE OF HYPERLIPIDEMIA ?Description: Patient and family will be able to manage HLD with medications and dietary modifications using handouts and educational resources independently ?Outcome: Progressing ?Goal: RH STG INCREASE KNOWLEDGE OF STROKE PROPHYLAXIS ?Description: Patient and family will be able to manage secondary stroke with medications and dietary modifications using handouts and educational resources independently ?Outcome: Progressing ?  ?

## 2022-01-28 NOTE — Progress Notes (Signed)
?                                                       PROGRESS NOTE ? ? ?Subjective/Complaints: ? ?Diarrhea subsiding, discussed UTI with pt and daughter at bedside, discussed perianal skin discomfort with pt/family as well as CNA ? ?ROS: Patient deniesCP, SOB, N/V/D ? ? ?Objective: ?  ?DG Abd 1 View ? ?Result Date: 01/27/2022 ?CLINICAL DATA:  Pain in lower central abdomen for 1 day. EXAM: ABDOMEN - 1 VIEW COMPARISON:  None. FINDINGS: The bowel gas pattern is normal. Calcifications are noted over the anticipated regions of the kidneys bilaterally measuring up to 6 mm on the left. Degenerative changes are present in the thoracolumbar spine. No acute osseous abnormality. IMPRESSION: 1. Nonobstructive bowel-gas pattern. 2. Calcifications projecting over the anticipated region of the kidneys bilaterally, possible nephrolithiasis. Electronically Signed   By: Thornell Sartorius M.D.   On: 01/27/2022 21:37   ?No results for input(s): WBC, HGB, HCT, PLT in the last 72 hours. ? ? ?No results for input(s): NA, K, CL, CO2, GLUCOSE, BUN, CREATININE, CALCIUM in the last 72 hours. ? ? ?Intake/Output Summary (Last 24 hours) at 01/28/2022 0759 ?Last data filed at 01/27/2022 1700 ?Gross per 24 hour  ?Intake 360 ml  ?Output --  ?Net 360 ml  ? ?  ? ?  ? ?Physical Exam: ?Vital Signs ?Blood pressure (!) 174/78, pulse 90, temperature 98.3 ?F (36.8 ?C), temperature source Oral, resp. rate 18, height 5' (1.524 m), weight 129.7 kg, SpO2 94 %. ? ?General: No acute distress ?Mood and affect are appropriate ?Heart: Regular rate and rhythm no rubs murmurs or extra sounds ?Lungs: Clear to auscultation, breathing unlabored, no rales or wheezes ?Abdomen: Positive bowel sounds, soft nontender to palpation, nondistended ?Extremities: No clubbing, cyanosis, or edema ?Skin: No evidence of breakdown, no evidence of rash ? ? ?Psych: pleasant and cooperative  ?Skin: intact ?Neurological:  ?   Comments: Patient is alert.  No acute distress.  Makes eye contact  with examiner.  Oriented to person and place.  Fair awareness of deficits. RUE remains 0/5, RLE 1-2/5, Left side 5/5. Sensation intact. ? ? ?Assessment/Plan: ?1. Functional deficits which require 3+ hours per day of interdisciplinary therapy in a comprehensive inpatient rehab setting. ?Physiatrist is providing close team supervision and 24 hour management of active medical problems listed below. ?Physiatrist and rehab team continue to assess barriers to discharge/monitor patient progress toward functional and medical goals ? ?Care Tool: ? ?Bathing ?   ?Body parts bathed by patient: Buttocks, Front perineal area, Right upper leg, Left upper leg  ?   ?  ?  ?Bathing assist Assist Level: Total Assistance - Patient < 25% ?  ?  ?Upper Body Dressing/Undressing ?Upper body dressing   ?What is the patient wearing?: Hospital gown only ?   ?Upper body assist Assist Level: Total Assistance - Patient < 25% ?   ?Lower Body Dressing/Undressing ?Lower body dressing ? ? ?   ?What is the patient wearing?: Hospital gown only, Incontinence brief ? ?  ? ?Lower body assist Assist for lower body dressing: Total Assistance - Patient < 25% ?   ? ?Toileting ?Toileting    ?Toileting assist Assist for toileting: Maximal Assistance - Patient 25 - 49% ?  ?  ?Transfers ?Chair/bed transfer ? ?Transfers assist ?  Chair/bed transfer activity did not occur: Safety/medical concerns ? ?Chair/bed transfer assist level: Dependent - mechanical lift (stedy) ?  ?  ?Locomotion ?Ambulation ? ? ?Ambulation assist ? ? Ambulation activity did not occur: Safety/medical concerns ? ?  ?  ?   ? ?Walk 10 feet activity ? ? ?Assist ? Walk 10 feet activity did not occur: Safety/medical concerns ? ?  ?   ? ?Walk 50 feet activity ? ? ?Assist Walk 50 feet with 2 turns activity did not occur: Safety/medical concerns ? ?  ?   ? ? ?Walk 150 feet activity ? ? ?Assist Walk 150 feet activity did not occur: Safety/medical concerns ? ?  ?  ?  ? ?Walk 10 feet on uneven surface   ?activity ? ? ?Assist Walk 10 feet on uneven surfaces activity did not occur: Safety/medical concerns ? ? ?  ?   ? ?Wheelchair ? ? ? ? ?Assist Is the patient using a wheelchair?: Yes ?Type of Wheelchair: Manual ?  ? ?Wheelchair assist level: Dependent - Patient 0% ?Max wheelchair distance: 0  ? ? ?Wheelchair 50 feet with 2 turns activity ? ? ? ?Assist ? ?  ?  ? ? ?Assist Level: Dependent - Patient 0%  ? ?Wheelchair 150 feet activity  ? ? ? ?Assist ?   ? ? ?Assist Level: Dependent - Patient 0%  ? ?Blood pressure (!) 174/78, pulse 90, temperature 98.3 ?F (36.8 ?C), temperature source Oral, resp. rate 18, height 5' (1.524 m), weight 129.7 kg, SpO2 94 %. ? ?Medical Problem List and Plan: ?1. 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            -patient may shower ?            -ELOS/Goals: 14-21 days MinA PT, S OT, S SLP ?            -HFU scheduled ? -Continue CIR therapies including PT, OT, and SLP    ?2.  Left lower extremity DVTs: Eliquis started. Discussed cost with patient and wife ?            -antiplatelet therapy: Aspirin 325 mg daily and Plavix 75 mg daily x3 months then aspirin alone ?3. Pain Management: N/A ?4. Mood: Provide emotional support ?            -antipsychotic agents: N/A ?5. Neuropsych: This patient is capable of making decisions on her own behalf. ?6. Skin/Wound Care: Routine skin checks, diarrhea related perianal irritation, order Tucks  ?7. Fluids/Electrolytes/Nutrition:   ? -appears to have good appetite, eating  100% ?8.  Diabetes mellitus.  Hemoglobin A1c 13.1.  Insulin therapy as directed with diabetic teaching. Educated regarding avoiding foods with added sugar.  ? CBG (last 3)  ?Recent Labs  ?  01/27/22 ?2234 01/27/22 ?2305 01/28/22 ?[id number]  ?GLUCAP 54* 78 122*  ? ?Has moderate SSI ? 3/29: increase  metformin to 500mg  BID and decrease insulin to 35U.  ?D/C metformin for now, reduce long acting , poor appetite, may need to increase meds again when feeling better and appetite improves  ?9.  Hypertension. Start Metoprolol 12.5mg  BID Continue Norvasc 10 mg daily.  Monitor with increased mobility. Increase magnesium gluconate to 500mg  HS.  ?Vitals:  ? 01/27/22 1941 01/28/22 0539  ?BP: (!) 171/69 (!) 174/78  ?Pulse: 89 90  ?Resp:  18 18  ?Temp: 98.3 ?F (36.8 ?C) 98.3 ?F (36.8 ?C)  ?SpO2: 95% 94%  ?Increase metoprolol to 25mg  BID ? ?10.  Hyperlipidemia.  Lipitor ?11.  Morbid obesity.  BMI greater than 56. Continue heart healthy/carb modified diet ?12. Vitamin D deficiency: start ergocalciferol 50,000U once per week for 7 weeks ?13. Insomnia: resolved with melatonin 3mg  HS, will change to prn. Placed sign on door not to wake before 5am.  ?14. AKI: improving, place nursing order to encourage 6-8 glasses of water per day, repeat creatinine tomorrow.  ?15. Rash on posterior legs: hypoallergenic sheets and hydrocortisone cream ordered, resolved, d/c hydrocortisone  ?16. Tachycardia: start lopressor 12.5mg  BID on 3/30. - monitor HR ?17.  Pt c/o abd cramping freq BM Mg++ and metformin held, no improvement , UA is +, on septra ds (PCN allergy)  ? ? ?LOS: ?11 days ?A FACE TO FACE EVALUATION WAS PERFORMED ? ? Calah Gershman ?01/28/2022, 7:59 AM  ? ?  ?

## 2022-01-28 NOTE — Progress Notes (Signed)
Hypoglycemic Event ? ?CBG: 54 ? ?Treatment: 8 oz juice/soda ? ?Symptoms: None ? ?Follow-up CBG: Time:2305 CBG Result:78 ? ?Possible Reasons for Event: Inadequate meal intake and Unknown ? ?Comments/MD notified:Will notify MD Kirsteins on AM rounds. ? ? ? ?Maud Rubendall, Sylvie Farrier ? ? ?

## 2022-01-29 LAB — GLUCOSE, CAPILLARY
Glucose-Capillary: 126 mg/dL — ABNORMAL HIGH (ref 70–99)
Glucose-Capillary: 142 mg/dL — ABNORMAL HIGH (ref 70–99)
Glucose-Capillary: 161 mg/dL — ABNORMAL HIGH (ref 70–99)
Glucose-Capillary: 219 mg/dL — ABNORMAL HIGH (ref 70–99)

## 2022-01-29 NOTE — Progress Notes (Signed)
?                                                       PROGRESS NOTE ? ? ?Subjective/Complaints: ? ?Pt reports LBM last night- no diarrhea right now.  ?UTI has been treated well- no more dysuria and frequency better.  ? ? ?ROS:  ?Pt denies SOB, abd pain, CP, N/V/C/D, and vision changes ? ?Objective: ?  ?DG Abd 1 View ? ?Result Date: 01/27/2022 ?CLINICAL DATA:  Pain in lower central abdomen for 1 day. EXAM: ABDOMEN - 1 VIEW COMPARISON:  None. FINDINGS: The bowel gas pattern is normal. Calcifications are noted over the anticipated regions of the kidneys bilaterally measuring up to 6 mm on the left. Degenerative changes are present in the thoracolumbar spine. No acute osseous abnormality. IMPRESSION: 1. Nonobstructive bowel-gas pattern. 2. Calcifications projecting over the anticipated region of the kidneys bilaterally, possible nephrolithiasis. Electronically Signed   By: Thornell Sartorius M.D.   On: 01/27/2022 21:37   ?No results for input(s): WBC, HGB, HCT, PLT in the last 72 hours. ? ? ?No results for input(s): NA, K, CL, CO2, GLUCOSE, BUN, CREATININE, CALCIUM in the last 72 hours. ? ?No intake or output data in the 24 hours ending 01/29/22 1852  ? ?  ? ?Physical Exam: ?Vital Signs ?Blood pressure 122/90, pulse 80, temperature 98.7 ?F (37.1 ?C), temperature source Oral, resp. rate 16, height 5' (1.524 m), weight 129.7 kg, SpO2 100 %. ? ? ?General: awake, alert, appropriate, laying in bed; sleepy;  NAD ?HENT: conjugate gaze; oropharynx moist ?CV: regular rate; no JVD ?Pulmonary: CTA B/L; no W/R/R- good air movement ?GI: soft, NT, ND, (+)BS- normoactive BS ?Psychiatric: appropriate- sleepy ?Neurological: Ox3 ? ?Psych: pleasant and cooperative  ?Skin: intact ?Neurological:  ?   Comments: Patient is alert.  No acute distress.  Makes eye contact with examiner.  Oriented to person and place.  Fair awareness of deficits. RUE remains 0/5, RLE 1-2/5, Left side 5/5. Sensation intact. ? ? ?Assessment/Plan: ?1. Functional deficits  which require 3+ hours per day of interdisciplinary therapy in a comprehensive inpatient rehab setting. ?Physiatrist is providing close team supervision and 24 hour management of active medical problems listed below. ?Physiatrist and rehab team continue to assess barriers to discharge/monitor patient progress toward functional and medical goals ? ?Care Tool: ? ?Bathing ?   ?Body parts bathed by patient: Right arm, Chest, Abdomen, Face  ? Body parts bathed by helper: Left arm, Front perineal area, Buttocks, Right upper leg, Left upper leg, Right lower leg, Left lower leg (lower body bathed by nursing) ?  ?  ?Bathing assist Assist Level: Total Assistance - Patient < 25% ?  ?  ?Upper Body Dressing/Undressing ?Upper body dressing   ?What is the patient wearing?: Bra, Pull over shirt ?   ?Upper body assist Assist Level: Maximal Assistance - Patient 25 - 49% ?   ?Lower Body Dressing/Undressing ?Lower body dressing ? ? ?   ?What is the patient wearing?: Pants, Incontinence brief ? ?  ? ?Lower body assist Assist for lower body dressing: Maximal Assistance - Patient 25 - 49% ?   ? ?Toileting ?Toileting    ?Toileting assist Assist for toileting: Maximal Assistance - Patient 25 - 49% ?  ?  ?Transfers ?Chair/bed transfer ? ?Transfers assist ? Chair/bed transfer activity did  not occur: Safety/medical concerns ? ?Chair/bed transfer assist level: Dependent - mechanical lift (stedy) ?  ?  ?Locomotion ?Ambulation ? ? ?Ambulation assist ? ? Ambulation activity did not occur: Safety/medical concerns ? ?  ?  ?   ? ?Walk 10 feet activity ? ? ?Assist ? Walk 10 feet activity did not occur: Safety/medical concerns ? ?  ?   ? ?Walk 50 feet activity ? ? ?Assist Walk 50 feet with 2 turns activity did not occur: Safety/medical concerns ? ?  ?   ? ? ?Walk 150 feet activity ? ? ?Assist Walk 150 feet activity did not occur: Safety/medical concerns ? ?  ?  ?  ? ?Walk 10 feet on uneven surface  ?activity ? ? ?Assist Walk 10 feet on uneven surfaces  activity did not occur: Safety/medical concerns ? ? ?  ?   ? ?Wheelchair ? ? ? ? ?Assist Is the patient using a wheelchair?: Yes ?Type of Wheelchair: Manual ?  ? ?Wheelchair assist level: Dependent - Patient 0% ?Max wheelchair distance: 0  ? ? ?Wheelchair 50 feet with 2 turns activity ? ? ? ?Assist ? ?  ?  ? ? ?Assist Level: Dependent - Patient 0%  ? ?Wheelchair 150 feet activity  ? ? ? ?Assist ?   ? ? ?Assist Level: Dependent - Patient 0%  ? ?Blood pressure 122/90, pulse 80, temperature 98.7 ?F (37.1 ?C), temperature source Oral, resp. rate 16, height 5' (1.524 m), weight 129.7 kg, SpO2 100 %. ? ?Medical Problem List and Plan: ?1. 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            -patient may shower ?            -ELOS/Goals: 14-21 days MinA PT, S OT, S SLP ?            -HFU scheduled ? -Continue CIR- PT, OT and SLP    ?2.  Left lower extremity DVTs: Eliquis started. Discussed cost with patient and wife ?            -antiplatelet therapy: Aspirin 325 mg daily and Plavix 75 mg daily x3 months then aspirin alone ?3. Pain Management: N/A ?4. Mood: Provide emotional support ?            -antipsychotic agents: N/A ?5. Neuropsych: This patient is capable of making decisions on her own behalf. ?6. Skin/Wound Care: Routine skin checks, diarrhea related perianal irritation, order Tucks  ?7. Fluids/Electrolytes/Nutrition:   ? -appears to have good appetite, eating  100% ?8.  Diabetes mellitus.  Hemoglobin A1c 13.1.  Insulin therapy as directed with diabetic teaching. Educated regarding avoiding foods with added sugar.  ? CBG (last 3)  ?Recent Labs  ?  01/29/22 ?[id number] 01/29/22 ?1129 01/29/22 ?1652  ?GLUCAP 126* 142* 161*  ?Has moderate SSI ? 3/29: increase metformin to 500mg  BID and decrease insulin to 35U.  ?D/C metformin for now, reduce  long acting , poor appetite, may need to increase meds again when feeling better and appetite improves  ? 4/3- CBGs looks adequate not great- doing better with insulin- will need to go home on insulin most likely.  ?9.  Hypertension. Start Metoprolol 12.5mg  BID Continue Norvasc 10 mg daily.  Monitor with increased mobility. Increase magnesium gluconate to 500mg  HS.  ?Vitals:  ? 01/29/22 0900 01/29/22  1200  ?BP: (!) 158/72 122/90  ?Pulse: 80 80  ?Resp: 16 16  ?Temp: 98.4 ?F (36.9 ?C) 98.7 ?F (37.1 ?C)  ?SpO2: 100% 100%  ?Increase metoprolol to 25mg  BID ? 4/3- BP a little high in AM, but better after AM BP meds- con't regimen for now- just got meds changed ?10.  Hyperlipidemia.  Lipitor ?11.  Morbid obesity.  BMI greater than 56. Continue heart healthy/carb modified diet ?12. Vitamin D deficiency: start ergocalciferol 50,000U once per week for 7 weeks ?13. Insomnia: resolved with melatonin 3mg  HS, will change to prn. Placed sign on door not to wake before 5am.  ?14. AKI: improving, place nursing order to encourage 6-8 glasses of water per day, repeat creatinine tomorrow.  ? 4/3- Last labs BUN of 21 and Cr 0.94- ratio >20- a little dry- will recheck labs in AM ?15. Rash on posterior legs: hypoallergenic sheets and hydrocortisone cream ordered, resolved, d/c hydrocortisone  ?16. Tachycardia: start lopressor 12.5mg  BID on 3/30. - monitor HR ?17.  Pt c/o abd cramping freq BM Mg++ and metformin held, no improvement , UA is +, on septra ds (PCN allergy)  ? ? ?I spent a total of  38   minutes on total care today- >50% coordination of care- due to d/w pt about UTI- review of chart about labs; and BP/CBGs treatment.  ? ? ?LOS: ?12 days ?A FACE TO FACE EVALUATION WAS PERFORMED ? ?Keylor Rands ?01/29/2022, 6:52 PM  ? ?  ?

## 2022-01-29 NOTE — Progress Notes (Signed)
Occupational Therapy Session Note ? ?Patient Details  ?Name: Samantha [name] ?MRN: [id number] ?Date of Birth: 08-Dec-1955 ? ?Today's Date: 01/29/2022 ?OT Individual Time: 1107-1140 ?OT Individual Time Calculation (min): 33 min  ? ? ?Short Term Goals: ?Week 2:  OT Short Term Goal 1 (Week 2): Pt will donn/doff pants with mod assist ?OT Short Term Goal 2 (Week 2): Pt will complete stand pivot with mod assist ?OT Short Term Goal 3 (Week 2): Pt will complete toileting with max assist at [facility] level. ?OT Short Term Goal 4 (Week 2): Pt will bathe UB/LB using long handled sponge with mod assist. ? ?Skilled Therapeutic Interventions/Progress Updates:  ?  Patient in bed upon arrival.  Patient reports she was sick all weekend, but feeling better now.  Patient indicates that she had bed bath for lower body prior to OT session.  Patient assisted to sitting position with mod assist and increased time.  Patient needed frequent cueing to shift weight to allow B feet on floor.  Patient needed assistance to dress RLE.  Patient able to thread LLE into pant leg.  Patient able to stand with mod assist to pull pants up over hips.  Patient breathing heavilly after each standing attempt.   ?Patient performed squat pivot transfer to wheelchair with mod assist,.   ?Patient assisted to bathe and dress upper body at sink.  Patient needed max assist to don bra, wash under breasts, in axillae.  Patient able to talk herself through helpful strategy - I.e. - "I should dress the other side first." ?Patient inattentive to RUE during Bathing and dressing session, but easily redirected.   ?Patient left seated in wheelchair with phone and call bell in reach, and safety belt in place and alarmed.   ?  ? ?Therapy Documentation ?Precautions:  ?Precautions ?Precautions: Fall ?Precaution Comments: R hemi (RUE > RLE) ?Restrictions ?Weight Bearing Restrictions: No ? ?Pain: ?Pain Assessment ?Pain Scale: 0-10 ?Pain Score: 0-No pain ? ? ? ?Therapy/Group: Individual  Therapy ? ?Mariah Milling ?01/29/2022, 12:44 PM ?

## 2022-01-29 NOTE — Progress Notes (Addendum)
Speech Language Pathology Daily Session Note ? ?Patient Details  ?Name: Samantha [name] ?MRN: [id number] ?Date of Birth: [birth year] ? ?Today's Date: 01/29/2022 ?SLP Individual Time: 8250-0370 ?SLP Individual Time Calculation (min): 45 min ? ?Short Term Goals: ?Week 2: SLP Short Term Goal 1 (Week 2): To improve awareness and self-advocacy, patient will recall 3 pieces of information relevant to IPR admission given Min verbal for use of internal and/or external memory aids. - Pt recall 100% of medications with use of medication log. Recalled name of use of 3 medications given Mod-Max verbal and model A for use of compensatory memory strategies for encoding. ? ?SLP Short Term Goal 2 (Week 2): Pt will correctly place/organize 3 different medications in pill organizer given Mod verbal and visual A. - Did not formally address.  ? ?SLP Short Term Goal 3 (Week 2): Patient will attend to right field of enviornment/body during functional tasks with Min verbal cues. - 100% scanning to R and L field during with reading task with Mod I. ? ?Skilled Therapeutic Interventions: ?Pt seen for skilled ST intervention targeting cognitive-linguistic goals outlined above. Pt encountered alert/awake and lying in bed. Pleasant and cooperative throughout. Agreeable to ST intervention at bedside. Oriented with supervision level assistance. ? ?SLP targeted STG's 1 and 3; please see above for objective data re: pt's performance. Overall, pt benefited from mod-max level verbal and model prompts to complete therapeutic tasks targeting short-term recall (storage and retrieval). SLP facilitated session by providing person-centered care model, errorless learning principles, spaced retrieval techniques, nonverbal corrective feedback, mod-max multimodal cueing, in addition to education, modeling and reinforcement re: use of compensatory memory strategies. Additionally, independently verbalized events that occurred over the weekend in regards to UTI and low  blood sugar levels due to inadequate meal intake. ? ?Session concluded with pt in bed, bed alarm on, call bell reviewed and within reach, and all immediate needs met. Continue per current ST POC next session.  ? ?Pain ?Denies pain; NAD ? ?Therapy/Group: Individual Therapy ? ?Armanie Ullmer A Deantae Shackleton ?01/29/2022, 1:11 PM ?

## 2022-01-29 NOTE — Progress Notes (Signed)
Physical Therapy Session Note ? ?Patient Details  ?Name: Samantha [name] ?MRN: [id number] ?Date of Birth: 1956/10/07 ? ?Today's Date: 01/29/2022 ?PT Individual Time: 5750-5183 ?PT Individual Time Calculation (min): 57 min  ? ?Short Term Goals: ?Week 2:  PT Short Term Goal 1 (Week 2): Pt complete bed mobility with maxA of 1 person ?PT Short Term Goal 2 (Week 2): Pt will complete bed<>chair transfers with maxA of 1 person ?PT Short Term Goal 3 (Week 2): Pt will tolerate static standing for 3 minutes to prepare for functional mobility training ?PT Short Term Goal 4 (Week 2): Pt will ambulate 34f with maxA of 1 person ? ?Skilled Therapeutic Interventions/Progress Updates:  ?   ?Pt presenting supine in bed and agreeable to PT tx - pt denies pain. Donned tennis shoes at bed level with totalA for time. Assisted to EOB requiring modA with cues for sidelying to sitting approach due to body habitus and dense R sided weakness. At EOB, able to sit unsupported with supervision. Stedy transfer from EOB to w/c - required raised EOB to stand, modA overall and [place]. Transported in w/c to main rehab gym for time. Worked on endurance, standing tolerance, and RLE weight bearing by utilizing the [employer](no+2 available). Instructed in 2 minutes of [place] - complete 4 sets - pt with increased work of breathing during each stand and has difficulty completing the full 2 minutes - required encouragement. Work of breathing settles quickly with rest. Pt assisted into supine position on mat table using large wedge to support her trunk. Upon supine, noted pt to be incontinent of bladder and pt unaware - when asked, pt confirmed that it occurred during sit<>stand and standing trials but did not inform therapist. Pt completed a few there-ex on mat table 2x5 heel slides, 2x5 bridges, and 2x5 hip abduction - all targeting her R side, requiring AAROM due to weakness.  Asissted back to sitting position at mat table with modA and used Stedy to transfer her back to room for time. NT notified of pt's incontinence and need for assist to clean. Direct handoff of care to NT with pt supine in bed, all needs met.  ? ?Therapy Documentation ?Precautions:  ?Precautions ?Precautions: Fall ?Precaution Comments: R hemi (RUE > RLE) ?Restrictions ?Weight Bearing Restrictions: No ?General: ?  ? ?Therapy/Group: Individual Therapy ? ?Valery Chance P Melani Brisbane ?01/29/2022, 7:31 AM  ?

## 2022-01-30 LAB — BASIC METABOLIC PANEL
Anion gap: 8 (ref 5–15)
BUN: 27 mg/dL — ABNORMAL HIGH (ref 8–23)
CO2: 24 mmol/L (ref 22–32)
Calcium: 9 mg/dL (ref 8.9–10.3)
Chloride: 110 mmol/L (ref 98–111)
Creatinine, Ser: 1.55 mg/dL — ABNORMAL HIGH (ref 0.44–1.00)
GFR, Estimated: 37 mL/min — ABNORMAL LOW (ref >60)
Glucose, Bld: 134 mg/dL — ABNORMAL HIGH (ref 70–99)
Potassium: 4.3 mmol/L (ref 3.5–5.1)
Sodium: 142 mmol/L (ref 135–145)

## 2022-01-30 LAB — CBC WITH DIFFERENTIAL/PLATELET
Abs Immature Granulocytes: 0.02 10*3/uL (ref 0.00–0.07)
Basophils Absolute: 0.1 10*3/uL (ref 0.0–0.1)
Basophils Relative: 1 %
Eosinophils Absolute: 0.3 10*3/uL (ref 0.0–0.5)
Eosinophils Relative: 4 %
HCT: 32.9 % — ABNORMAL LOW (ref 36.0–46.0)
Hemoglobin: 10.3 g/dL — ABNORMAL LOW (ref 12.0–15.0)
Immature Granulocytes: 0 %
Lymphocytes Relative: 23 %
Lymphs Abs: 2 10*3/uL (ref 0.7–4.0)
MCH: 32.1 pg (ref 26.0–34.0)
MCHC: 31.3 g/dL (ref 30.0–36.0)
MCV: 102.5 fL — ABNORMAL HIGH (ref 80.0–100.0)
Monocytes Absolute: 0.7 10*3/uL (ref 0.1–1.0)
Monocytes Relative: 8 %
Neutro Abs: 5.6 10*3/uL (ref 1.7–7.7)
Neutrophils Relative %: 64 %
Platelets: 195 10*3/uL (ref 150–400)
RBC: 3.21 MIL/uL — ABNORMAL LOW (ref 3.87–5.11)
RDW: 12.8 % (ref 11.5–15.5)
WBC: 8.8 10*3/uL (ref 4.0–10.5)
nRBC: 0 % (ref 0.0–0.2)

## 2022-01-30 LAB — GLUCOSE, CAPILLARY
Glucose-Capillary: 135 mg/dL — ABNORMAL HIGH (ref 70–99)
Glucose-Capillary: 158 mg/dL — ABNORMAL HIGH (ref 70–99)
Glucose-Capillary: 132 mg/dL — ABNORMAL HIGH (ref 70–99)
Glucose-Capillary: 159 mg/dL — ABNORMAL HIGH (ref 70–99)

## 2022-01-30 NOTE — Progress Notes (Signed)
Orthopedic Tech Progress Note ?Patient Details:  ?Samantha [name] ?[birth year] ?[id number] ? ?Called in order to [employer] for a RESTING HAND SPLINT  ? ?Patient ID: Samantha [name], female   DOB: 09-25-1956, 66 y.o.   MRN: [id number] ? ?Donald Pore ?01/30/2022, 10:50 AM ? ?

## 2022-01-30 NOTE — Progress Notes (Addendum)
?                                                       PROGRESS NOTE ? ? ?Subjective/Complaints: ?RIgh tarm numbess mainly in am but overall improving  ? ?ROS:  ?Pt denies SOB, abd pain, CP, N/V/C/D, and vision changes ? ?Objective: ?  ?No results found. ?Recent Labs  ?  01/30/22 ?[id number]  ?WBC 8.8  ?HGB 10.3*  ?HCT 32.9*  ?PLT 195  ? ? ? ?Recent Labs  ?  01/30/22 ?[id number]  ?NA 142  ?K 4.3  ?CL 110  ?CO2 24  ?GLUCOSE 134*  ?BUN 27*  ?CREATININE 1.55*  ?CALCIUM 9.0  ? ? ?No intake or output data in the 24 hours ending 01/30/22 0735  ? ?  ? ?Physical Exam: ?Vital Signs ?Blood pressure (!) 141/71, pulse 71, temperature 98.2 ?F (36.8 ?C), temperature source Oral, resp. rate 14, height 5' (1.524 m), weight 129.7 kg, SpO2 94 %. ? ? ?General: No acute distress ?Mood and affect are appropriate ?Heart: Regular rate and rhythm no rubs murmurs or extra sounds ?Lungs: Clear to auscultation, breathing unlabored, no rales or wheezes ?Abdomen: Positive bowel sounds, soft nontender to palpation, nondistended ?Extremities: No clubbing, cyanosis, or edema ?Skin: No evidence of breakdown, no evidence of rash ? ? ? ?Psych: pleasant and cooperative  ?Skin: intact ?Neurological:  ?   Comments: Patient is alert.  No acute distress.  Makes eye contact with examiner.  Oriented to person and place.  Fair awareness of deficits. RUE remains 0/5, RLE 1-2/5, Left side 5/5. Sensation intact. ? ? ?Assessment/Plan: ?1. Functional deficits which require 3+ hours per day of interdisciplinary therapy in a comprehensive inpatient rehab setting. ?Physiatrist is providing close team supervision and 24 hour management of active medical problems listed below. ?Physiatrist and rehab team continue to assess barriers to discharge/monitor patient progress toward functional and medical goals ? ?Care Tool: ? ?Bathing ?   ?Body parts bathed by patient: Right arm, Chest, Abdomen, Face  ? Body parts bathed by helper: Left arm, Front perineal area, Buttocks, Right upper  leg, Left upper leg, Right lower leg, Left lower leg (lower body bathed by nursing) ?  ?  ?Bathing assist Assist Level: Total Assistance - Patient < 25% ?  ?  ?Upper Body Dressing/Undressing ?Upper body dressing   ?What is the patient wearing?: Bra, Pull over shirt ?   ?Upper body assist Assist Level: Maximal Assistance - Patient 25 - 49% ?   ?Lower Body Dressing/Undressing ?Lower body dressing ? ? ?   ?What is the patient wearing?: Pants, Incontinence brief ? ?  ? ?Lower body assist Assist for lower body dressing: Maximal Assistance - Patient 25 - 49% ?   ? ?Toileting ?Toileting    ?Toileting assist Assist for toileting: Maximal Assistance - Patient 25 - 49% ?  ?  ?Transfers ?Chair/bed transfer ? ?Transfers assist ? Chair/bed transfer activity did not occur: Safety/medical concerns ? ?Chair/bed transfer assist level: Dependent - mechanical lift (stedy) ?  ?  ?Locomotion ?Ambulation ? ? ?Ambulation assist ? ? Ambulation activity did not occur: Safety/medical concerns ? ?  ?  ?   ? ?Walk 10 feet activity ? ? ?Assist ? Walk 10 feet activity did not occur: Safety/medical concerns ? ?  ?   ? ?Walk 50 feet  activity ? ? ?Assist Walk 50 feet with 2 turns activity did not occur: Safety/medical concerns ? ?  ?   ? ? ?Walk 150 feet activity ? ? ?Assist Walk 150 feet activity did not occur: Safety/medical concerns ? ?  ?  ?  ? ?Walk 10 feet on uneven surface  ?activity ? ? ?Assist Walk 10 feet on uneven surfaces activity did not occur: Safety/medical concerns ? ? ?  ?   ? ?Wheelchair ? ? ? ? ?Assist Is the patient using a wheelchair?: Yes ?Type of Wheelchair: Manual ?  ? ?Wheelchair assist level: Dependent - Patient 0% ?Max wheelchair distance: 0  ? ? ?Wheelchair 50 feet with 2 turns activity ? ? ? ?Assist ? ?  ?  ? ? ?Assist Level: Dependent - Patient 0%  ? ?Wheelchair 150 feet activity  ? ? ? ?Assist ?   ? ? ?Assist Level: Dependent - Patient 0%  ? ?Blood pressure (!) 141/71, pulse 71, temperature 98.2 ?F (36.8 ?C),  temperature source Oral, resp. rate 14, height 5' (1.524 m), weight 129.7 kg, SpO2 94 %. ? ?Medical Problem List and Plan: ?1. 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            -patient may shower ?            -ELOS/Goals: 14-21 days MinA PT, S OT, S SLP, team conf in am  ?            -HFU scheduled ? -Continue CIR- PT, OT and SLP    ?2.  Left lower extremity DVTs: Eliquis started. Discussed cost with patient and wife ?            -antiplatelet therapy: Aspirin 325 mg daily and Plavix 75 mg daily x3 months then aspirin alone ?3. Pain Management: N/A ?4. Mood: Provide emotional support ?            -antipsychotic agents: N/A ?5. Neuropsych: This patient is capable of making decisions on her own behalf. ?6. Skin/Wound Care: Routine skin checks, diarrhea related perianal irritation, order Tucks  ?7. Fluids/Electrolytes/Nutrition:   ? -appears to have good appetite, eating  100% ?8.  Diabetes mellitus.  Hemoglobin A1c 13.1.  Insulin therapy as directed with diabetic teaching. Educated regarding avoiding foods with added sugar.  ? CBG (last 3)  ?Recent Labs  ?  01/29/22 ?1652 01/29/22 ?2106 01/30/22 ?0625  ?GLUCAP 161* 219* 135*  ? ?Has moderate SSI ? 3/29: increase metformin to 500mg  BID and decrease insulin to 35U.  ?D/C metformin , fair control ?9.  Hypertension. Start Metoprolol 12.5mg  BID Continue Norvasc 10 mg daily.  Monitor with increased mobility. Increase magnesium gluconate to 500mg  HS.  ?Vitals:  ? 01/29/22 1922 01/30/22 0329  ?BP: 123/62 (!) 141/71  ?Pulse: 80 71  ?Resp: 16 14  ?Temp: 98.6 ?F (37 ?C) 98.2 ?F (36.8 ?C)  ?SpO2: 98% 94%  ?Increase metoprolol to 25mg  BID ?  ?10.  Hyperlipidemia.  Lipitor ?11.  Morbid obesity.  BMI greater than 56. Continue heart healthy/carb modified diet ?12. Vitamin D deficiency: start  ergocalciferol 50,000U once per week for 7 weeks ?13. Insomnia: resolved with melatonin 3mg  HS, will change to prn. Placed sign on door not to wake before 5am.  ?14. AKI: improving, place nursing order to encourage 6-8 glasses of water  per day, repeat creatinine tomorrow.  ? 4/3- Last labs BUN of 21 and Cr 0.94- creat up, ,may be related to metform vs Septra plus hydration enc fluids , recheck on 4/7 ?15. Rash on posterior legs: hypoallergenic sheets and hydrocortisone cream ordered, resolved, d/c hydrocortisone  ?16. Tachycardia: start lopressor 12.5mg  BID on 3/30. - resolved ?17.  No further abd cramping likely due to  UTI ecoli >100K on Septra DS ? ? ? ?LOS: ?13 days ?A FACE TO FACE EVALUATION WAS PERFORMED ? ?Samantha [name] ?01/30/2022, 7:35 AM  ? ?  ?

## 2022-01-30 NOTE — Progress Notes (Signed)
Occupational Therapy Session Note ? ?Patient Details  ?Name: Samantha [name] ?MRN: [id number] ?Date of Birth: November 24, 1955 ? ?Today's Date: 01/30/2022 ?OT Individual Time: 0930-1030 ?OT Individual Time Calculation (min): 60 min  ? ? ?Short Term Goals: ?Week 3:    ? ?Skilled Therapeutic Interventions/Progress Updates:  ?   ? ?Therapy Documentation ?Precautions:  ?Precautions ?Precautions: Fall ?Precaution Comments: R hemi (RUE > RLE) ?Restrictions ?Weight Bearing Restrictions: No ?General: ?  ?Vital Signs: ? ?Pain:No reports of pain. ?  ?Patient seen for am ADL. Patient assisted OOB with Max assist to roll to right side and transition to sitting EOB. Max assist in addition to verbal cues to get to EOB and set LE's up for a squat pivot transfer to the w/c with assist of two. Patient assisted with bathing at the sink- Max to thoroughly wash and get all body parts. Hemi dressing from w/c required Max assist with cues to utilize hemi technique and to support the RUE. Patient required assist for adjusting bra straps and hooking following donning/ getting arms through straps. Donning pull over shirt required max assist with cues and tips for dressing technique. LE dressing reuired assist of two. Patient able to lift RLE to assist in threading through brief and pant leg. Able to easily step into pant let with the LLE. To manage pants patient required one person to block RLE and manage balance while patient stood at sink. Second clinician pulled up pants and brief. Patient able to perform grooming seated with minimal assist. Transfer to toilet with a stand pivot assist of two. Once safely standing in front of toilet patient able to use grab bar and therapist assist to balance for total assist pants down. Performed pericare. Total assist for pants up once standing with therapist support. ?Patient encouraged to keep working hard and not to become discouraged by the level of assistance required. Patient is developing tone in the RUE  with initial flexor tightness in the digits that quickly responded to ROM/mobilization.    ? ? ? ?Therapy/Group: Individual Therapy ? ?Warnell Forester ?01/30/2022, 1:01 PM ?

## 2022-01-30 NOTE — Progress Notes (Signed)
Physical Therapy Session Note ? ?Patient Details  ?Name: Samantha [name] ?MRN: [id number] ?Date of Birth: May 13, 1956 ? ?Today's Date: 01/30/2022 ?PT Individual Time: 5188-4166 ?PT Individual Time Calculation (min): 72 min  ? ?Short Term Goals: ?Week 2:  PT Short Term Goal 1 (Week 2): Pt complete bed mobility with maxA of 1 person ?PT Short Term Goal 2 (Week 2): Pt will complete bed<>chair transfers with maxA of 1 person ?PT Short Term Goal 3 (Week 2): Pt will tolerate static standing for 3 minutes to prepare for functional mobility training ?PT Short Term Goal 4 (Week 2): Pt will ambulate 75f with maxA of 1 person ? ?Skilled Therapeutic Interventions/Progress Updates:  ?   ? ?Pt sitting in w/c to start session - agreeable to PT tx and denies pain. Transported in w/c to main rehab gym. Assisted to mat table via modA squat<>pivot transfer, towards her weaker R side. Assessed sitting balance with Function in Sitting Test (FIST). See below for results.  ? ?We then worked on SB transfers from mat table to w/c. She initially required moA with +2 on standby stabilizing w/c for safety - pt progressed to minA (!) SB transfers with mod instructional cues and required blocking of R leg to prevent from sliding forward. Completed several repetitions of these and she actually transferred better towards her weaker R side than her L side.  ? ?Completed repeated sit<>stands with min/modA and R knee blocked - task overlay for hanging horseshoes on raised basketball rim to promote upright posture and trunk elongation. Emphasis on controlled lowering while sitting. ? ?Standing balance/tolerance using back of arm chair on LUE and no support on RUE - PT blocking R knee and minA needed for stability. 14m standing, 2 min rest x3 sets. She continues to demonstrate poor standing tolerance and increased work of breathing fater ~30 seconds of standing - unclear if anxiety/fear or related to fatigue. ? ?Pt concluded session seated in w/c, all needs  met, call bell in reach. ? ?Therapy Documentation ?Precautions:  ?Precautions ?Precautions: Fall ?Precaution Comments: R hemi (RUE > RLE) ?Restrictions ?Weight Bearing Restrictions: No ?General: ?  ?Balance: ?Balance ?Balance Assessed: Yes ?Standardized Balance Assessment ?Standardized Balance Assessment: FIST ?Function in Sitting Test = FIST ?Anterior Nudge: superior sternum: Independent ?Posterior Nudge: between scapular spines: Independent ?Lateral Nudge: to dominant side at acromion: Independent ?Static Sitting: 30 seconds: Independent ?Sitting, Shake "No": left and right: Independent ?Sitting, Eyes Closed: 30 seconds: Independent ?Sitting, Lift Foot: dominant side, lift foot 1 inch twice: Independent ?Pick Up Object From Behind: object at midline, hands breadth posterior: Independent ?Forward Reach: use dominant arm, must complete full motion: Verbal cues/increased time ?Lateral Reach: use dominant arm, clear opposite ischial tuberosity: Upper extremity support ?Pick Up Object From Floor: from between feet: Independent ?Posterior Scooting: move backwards 2 inches: Needs assistance ?Posterior Scooting Assistance: Mod Assist ?Anterior Scooting: move forward 2 inches: Upper extremity support ?Lateral Scooting: move to dominent side 2 inches: Upper extremity support ?FIST TOTAL SCORE: 46/56 ? ? ? ?Therapy/Group: Individual Therapy ? ?Samantha [name] P Samantha [name] PT ?01/30/2022, 11:13 AM  ?

## 2022-01-30 NOTE — Progress Notes (Signed)
Occupational Therapy Session Note ? ?Patient Details  ?Name: Samantha [name] ?MRN: [id number] ?Date of Birth: May 17, 1956 ? ?Today's Date: 01/30/2022 ?OT Individual Time: 3545-6256 ?OT Individual Time Calculation (min): 39 min  ? ? ?Short Term Goals: ?Week 2:  OT Short Term Goal 1 (Week 2): Pt will donn/doff pants with mod assist ?OT Short Term Goal 2 (Week 2): Pt will complete stand pivot with mod assist ?OT Short Term Goal 3 (Week 2): Pt will complete toileting with max assist at [facility] level. ?OT Short Term Goal 4 (Week 2): Pt will bathe UB/LB using long handled sponge with mod assist. ? ?Skilled Therapeutic Interventions/Progress Updates:  ?  Pt resting in w/c upon arrival. Initial focus on RUE PROM/AAROM and educated pt on SROM. Increased internal rotator tone and increase tone in fingers. Pt requested to return to bed. SB transfer with tot A+2. Pt's RLE with significant extensor tone during transfer. Repositioning in bed with tot A+2. Pt able to roll R/L with max A+2. A resting hand splint had been delievered during the day. Splint placed on Rt hand to assure fit and educate pt on purpose and wearing schedule. Informed RN that splint to be worn at night. Pt remained in bed with all needs within reach. Bed alarm activated.  ? ?Therapy Documentation ?Precautions:  ?Precautions ?Precautions: Fall ?Precaution Comments: R hemi (RUE > RLE) ?Restrictions ?Weight Bearing Restrictions: No ? ?Pain: ? Pt denies pain this afternoon ?  ? ? ?Therapy/Group: Individual Therapy ? ?Rich Brave ?01/30/2022, 2:24 PM ?

## 2022-01-30 NOTE — Progress Notes (Signed)
Occupational Therapy Session Note ? ?Patient Details  ?Name: Samantha [name] ?MRN: [id number] ?Date of Birth: May 16, 1956 ? ?Today's Date: 01/30/2022 ?OT Individual Time: 4037-5436 ?OT Individual Time Calculation (min): 26 min  ? ? ?Short Term Goals: ?Week 1:  OT Short Term Goal 1 (Week 1): Pt will complete donning/doffing shirt with mod assist ?OT Short Term Goal 1 - Progress (Week 1): Met ?OT Short Term Goal 2 (Week 1): Pt will donn/doff pants with mod assist ?OT Short Term Goal 2 - Progress (Week 1): Progressing toward goal ?OT Short Term Goal 3 (Week 1): Pt will complete stand pivot with mod assist ?OT Short Term Goal 3 - Progress (Week 1): Progressing toward goal ?OT Short Term Goal 4 (Week 1): Pt will complete toileting with max assist at [facility] level. ?OT Short Term Goal 4 - Progress (Week 1): Progressing toward goal ?Week 2:  OT Short Term Goal 1 (Week 2): Pt will donn/doff pants with mod assist ?OT Short Term Goal 2 (Week 2): Pt will complete stand pivot with mod assist ?OT Short Term Goal 3 (Week 2): Pt will complete toileting with max assist at [facility] level. ?OT Short Term Goal 4 (Week 2): Pt will bathe UB/LB using long handled sponge with mod assist. ? ?Skilled Therapeutic Interventions/Progress Updates:  ?Patient met seated in wc in agreement with OT treatment session. No reports of pain. Total A for wc transport to and from rehab gym for time management. Focus of session on RUE NMR as detailed below. Towel pushes with support at R elbow and non-affected LUE over RUE. Patient then completed self-ROM with cues for pacing/form. Noted difficulty attaining extension of digits with slight flexion present. Difficulty passively extending/abducting the thumb. Patient may benefit from night resting-hand splint. Will continue to assess. Returned to room in same manner as indicated above. Session concluded with patient seated in wc with call bell within reach, belt alarm activated and all needs met.  ? ?Therapy  Documentation ?Precautions:  ?Precautions ?Precautions: Fall ?Precaution Comments: R hemi (RUE > RLE) ?Restrictions ?Weight Bearing Restrictions: No ?General: ?  ?Therapy/Group: Individual Therapy ? ?Aixa Corsello R Howerton-Davis ?01/30/2022, 1:32 PM ?

## 2022-01-31 ENCOUNTER — Ambulatory Visit: Payer: Self-pay | Admitting: Physician Assistant

## 2022-01-31 LAB — URINE CULTURE: Culture: >=100000 — AB

## 2022-01-31 LAB — GLUCOSE, CAPILLARY
Glucose-Capillary: 141 mg/dL — ABNORMAL HIGH (ref 70–99)
Glucose-Capillary: 103 mg/dL — ABNORMAL HIGH (ref 70–99)
Glucose-Capillary: 66 mg/dL — ABNORMAL LOW (ref 70–99)
Glucose-Capillary: 158 mg/dL — ABNORMAL HIGH (ref 70–99)

## 2022-01-31 MED ORDER — FOSFOMYCIN TROMETHAMINE 3 G PO PACK
3.0000 g | PACK | ORAL | Status: AC
  Administered 2022-01-31 – 2022-02-03 (×2): 3 g via ORAL
  Filled 2022-01-31 (×2): qty 3

## 2022-01-31 MED ORDER — ESCITALOPRAM OXALATE 10 MG PO TABS
5.0000 mg | ORAL_TABLET | Freq: Every day | ORAL | Status: DC
  Administered 2022-01-31 – 2022-02-09 (×10): 5 mg via ORAL
  Filled 2022-01-31 (×10): qty 1

## 2022-01-31 NOTE — Progress Notes (Signed)
Speech Language Pathology Daily Session Note ? ?Patient Details  ?Name: Samantha [name] ?MRN: [id number] ?Date of Birth: 06-Nov-1955 ? ?Today's Date: 01/31/2022 ?SLP Individual Time: 2263-3354 ?SLP Individual Time Calculation (min): 45 min ? ?Short Term Goals: ?Week 2: SLP Short Term Goal 1 (Week 2): To improve awareness and self-advocacy, patient will recall 3 pieces of information relevant to IPR admission given Min verbal for use of internal and/or external memory aids. ?SLP Short Term Goal 2 (Week 2): Pt will correctly place/organize 3 different medications in pill organizer given Mod verbal and visual A. ?SLP Short Term Goal 3 (Week 2): Patient will attend to right field of enviornment/body during functional tasks with Min verbal cues. ? ?Skilled Therapeutic Interventions: Skilled ST treatment focused on cognitive goals. SLP facilitated session with overall mod A for implementation of compensatory memory strategies during visual and working memory tasks on iPad using spaced retrieval techniques, repetition, and association strategies. Patient was able to independently verbalize events that occurred in therapy earlier today involving her request to return to room to rest in effort to not "over do it" and with emphasis on "listening" to her body and mind. Patient was left in bed with alarm activated and immediate needs within reach at end of session. Continue per current plan of care.   ?   ?Pain ?Pain Assessment ?Pain Scale: 0-10 ?Pain Score: 0-No pain ? ?Therapy/Group: Individual Therapy ? ?Shaney Deckman T Myer Bohlman ?01/31/2022, 5:02 PM ?

## 2022-01-31 NOTE — Progress Notes (Signed)
Physical Therapy Session Note ? ?Patient Details  ?Name: Samantha [name] ?MRN: [id number] ?Date of Birth: 09/09/1956 ? ?Today's Date: 01/31/2022 ?PT Individual Time: 1412-1422 ?PT Individual Time Calculation (min): 10 min  and Today's Date: 01/31/2022 ?PT Missed Time: 35 Minutes ?Missed Time Reason: Patient fatigue  ? ?Short Term Goals: ?Week 2:  PT Short Term Goal 1 (Week 2): Pt complete bed mobility with maxA of 1 person ?PT Short Term Goal 2 (Week 2): Pt will complete bed<>chair transfers with maxA of 1 person ?PT Short Term Goal 3 (Week 2): Pt will tolerate static standing for 3 minutes to prepare for functional mobility training ?PT Short Term Goal 4 (Week 2): Pt will ambulate 10ft with maxA of 1 person ? ?Skilled Therapeutic Interventions/Progress Updates:  ?   ? ?Pt lying supine in bed to start with her husband at bedside. Pt appearing fatigue and requesting to rest and not mobilize OOB despite encouragement. Spent time discussing with husband, [name], pt's current mobility level - recommending him to measure doorways to make sure 24inch w/c will fit. [name] also confirms ramp is being built for the home. Pt assisted up in the bed with [name] assisting, HOB in trendelenburg and +2 assist needed. Concluded session semi-reclined in bed with all needs met. Pt missed 35 minutes of therapy due to fatigue.  ? ?Therapy Documentation ?Precautions:  ?Precautions ?Precautions: Fall ?Precaution Comments: R hemi (RUE > RLE) ?Restrictions ?Weight Bearing Restrictions: No ?General: ?  ? ?Therapy/Group: Individual Therapy ? ?Christian P Manhard ?01/31/2022, 7:34 AM  ?

## 2022-01-31 NOTE — Progress Notes (Signed)
Occupational Therapy Session Note ? ?Patient Details  ?Name: Samantha [name] ?MRN: [id number] ?Date of Birth: 03/19/1956 ? ?Today's Date: 01/31/2022 ?OT Individual Time: 6948-5462 ?OT Individual Time Calculation (min): 75 min  ? ? ?Short Term Goals: ?Week 2:  OT Short Term Goal 1 (Week 2): Pt will donn/doff pants with mod assist ?OT Short Term Goal 2 (Week 2): Pt will complete stand pivot with mod assist ?OT Short Term Goal 3 (Week 2): Pt will complete toileting with max assist at [facility] level. ?OT Short Term Goal 4 (Week 2): Pt will bathe UB/LB using long handled sponge with mod assist. ? ?Skilled Therapeutic Interventions/Progress Updates:  ?  Pt semi reclined in bed, flat affect and quiet.  Reports feeling overwhelmed by how hard everything is to do at the moment.  Therapist validated pts feelings and provided therapeutic use of self.  Made MD and interdisciplinary team aware of pts appearing anxious and overwhelmed throughout session.   Pt requesting to use bathroom however upon completing bed mobility including right roll with mod assist and supine to sit with max assist, pt then reports incontinence of urine occurred and needing bed level toileting.  Sit to supine with max assist.  Repositioned toward [facility] with max assist +2. Rolled to left with total assist and toward right with mod assist for dependent pericare and clothing management. Supine to sit again with max assist.  Sliding board transfer towards left EOB to w/c with mod assist and step by step cues.  Pt transported to sink where she doffed shirt with mod assist and re-education on hemi technique needed.  Pt bathed UB with mod assist needing manual support to elevated RUE.  Donned shirt with mod assist with cues for initiating hemi technique.  Pt requesting to use bathroom again needing to have a bowel movement.  Sit<>stand at stedy for transport to bariatric BSC and pulled pants and brief with max assist.  Direct hand off to nurse tech.   ? ?Therapy  Documentation ?Precautions:  ?Precautions ?Precautions: Fall ?Precaution Comments: R hemi (RUE > RLE) ?Restrictions ?Weight Bearing Restrictions: No ? ? ?Therapy/Group: Individual Therapy ? ?[name] Anishka Bushard ?01/31/2022, 12:16 PM ?

## 2022-01-31 NOTE — Progress Notes (Signed)
Patient ID: Samantha [name], female   DOB: 09/16/1956, 66 y.o.   MRN: [id number]  Met with pt to discuss team conference and progress this week. Pt tearful and wanting to lay down. Discussed will ask neuro-psych to see and MD has started on lexapro to assist with her anxiety and depression. Pt aware of discharge date of 4/18. Will contact husband to inform and have come in to begin training with pt in therapies to prepare for home. Pt will be much care at home and will need to educate husband and other family members providing care to her. ?

## 2022-01-31 NOTE — Progress Notes (Signed)
?                                                       PROGRESS NOTE ? ? ?Subjective/Complaints: ? ?No issues overnite, no bladder problems loose stools subsided  ? ?ROS:  ?Pt denies SOB, abd pain, CP, N/V/C/D, and vision changes ? ?Objective: ?  ?No results found. ?Recent Labs  ?  01/30/22 ?2563  ?WBC 8.8  ?HGB 10.3*  ?HCT 32.9*  ?PLT 195  ? ? ? ? ?Recent Labs  ?  01/30/22 ?8937  ?NA 142  ?K 4.3  ?CL 110  ?CO2 24  ?GLUCOSE 134*  ?BUN 27*  ?CREATININE 1.55*  ?CALCIUM 9.0  ? ? ? ? ?Intake/Output Summary (Last 24 hours) at 01/31/2022 0746 ?Last data filed at 01/30/2022 2044 ?Gross per 24 hour  ?Intake 100 ml  ?Output --  ?Net 100 ml  ?  ? ?  ? ?Physical Exam: ?Vital Signs ?Blood pressure 135/76, pulse 72, temperature 98.9 ?F (37.2 ?C), temperature source Oral, resp. rate 16, height 5' (1.524 m), weight 129.7 kg, SpO2 99 %. ? ? ?General: No acute distress ?Mood and affect are appropriate ?Heart: Regular rate and rhythm no rubs murmurs or extra sounds ?Lungs: Clear to auscultation, breathing unlabored, no rales or wheezes ?Abdomen: Positive bowel sounds, soft nontender to palpation, nondistended ?Extremities: No clubbing, cyanosis, or edema ?Skin: No evidence of breakdown, no evidence of rash ? ?Psych: pleasant and cooperative  ? ?Neurological:  ?   Comments: Patient is alert.  No acute distress.  Makes eye contact with examiner.  Oriented to person and place.  Fair awareness of deficits. RUE remains 0/5, RLE 1-2/5, Left side 5/5. Sensation intact. ? ? ?Assessment/Plan: ?1. Functional deficits which require 3+ hours per day of interdisciplinary therapy in a comprehensive inpatient rehab setting. ?Physiatrist is providing close team supervision and 24 hour management of active medical problems listed below. ?Physiatrist and rehab team continue to assess barriers to discharge/monitor patient progress toward functional and medical goals ? ?Care Tool: ? ?Bathing ?   ?Body parts bathed by patient: Face, Left upper leg, Chest,  Right upper leg  ? Body parts bathed by helper: Right arm, Left arm, Front perineal area, Abdomen, Buttocks, Right lower leg, Left lower leg ?  ?  ?Bathing assist Assist Level: Maximal Assistance - Patient 24 - 49% ?  ?  ?Upper Body Dressing/Undressing ?Upper body dressing   ?What is the patient wearing?: Bra, Pull over shirt ?   ?Upper body assist Assist Level: Maximal Assistance - Patient 25 - 49% ?   ?Lower Body Dressing/Undressing ?Lower body dressing ? ? ?   ?What is the patient wearing?: Pants, Incontinence brief ? ?  ? ?Lower body assist Assist for lower body dressing: Maximal Assistance - Patient 25 - 49% ?   ? ?Toileting ?Toileting    ?Toileting assist Assist for toileting: Maximal Assistance - Patient 25 - 49% ?  ?  ?Transfers ?Chair/bed transfer ? ?Transfers assist ? Chair/bed transfer activity did not occur: Safety/medical concerns ? ?Chair/bed transfer assist level: Total Assistance - Patient < 25% ?  ?  ?Locomotion ?Ambulation ? ? ?Ambulation assist ? ? Ambulation activity did not occur: Safety/medical concerns ? ?  ?  ?   ? ?Walk 10 feet activity ? ? ?Assist ? Walk 10 feet  activity did not occur: Safety/medical concerns ? ?  ?   ? ?Walk 50 feet activity ? ? ?Assist Walk 50 feet with 2 turns activity did not occur: Safety/medical concerns ? ?  ?   ? ? ?Walk 150 feet activity ? ? ?Assist Walk 150 feet activity did not occur: Safety/medical concerns ? ?  ?  ?  ? ?Walk 10 feet on uneven surface  ?activity ? ? ?Assist Walk 10 feet on uneven surfaces activity did not occur: Safety/medical concerns ? ? ?  ?   ? ?Wheelchair ? ? ? ? ?Assist Is the patient using a wheelchair?: Yes ?Type of Wheelchair: Manual ?  ? ?Wheelchair assist level: Dependent - Patient 0% ?Max wheelchair distance: 0  ? ? ?Wheelchair 50 feet with 2 turns activity ? ? ? ?Assist ? ?  ?  ? ? ?Assist Level: Dependent - Patient 0%  ? ?Wheelchair 150 feet activity  ? ? ? ?Assist ?   ? ? ?Assist Level: Dependent - Patient 0%  ? ?Blood pressure  135/76, pulse 72, temperature 98.9 ?F (37.2 ?C), temperature source Oral, resp. rate 16, height 5' (1.524 m), weight 129.7 kg, SpO2 99 %. ? ?Medical Problem List and Plan: ?1. 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            -patient may shower ?            -ELOS/Goals: 14-21 days MinA PT, S OT, S SLP, Team conference today please see physician documentation under team conference tab, met with team  to discuss problems,progress, and goals. Formulized individual treatment plan based on medical history, underlying problem and comorbidities. ? ?            -HFU scheduled ? -Continue CIR- PT, OT and SLP    ?2.  Left lower extremity DVTs: Eliquis started. Discussed cost with patient and wife ?            -antiplatelet therapy: Aspirin 325 mg daily and Plavix 75 mg daily x3 months then aspirin alone ?3. Pain Management: N/A ?4. Mood: Provide emotional support ?            -antipsychotic agents: N/A ?5. Neuropsych: This patient is capable of making decisions on her own behalf. ?6. Skin/Wound Care: Routine skin checks, diarrhea related perianal irritation, order Tucks  ?7. Fluids/Electrolytes/Nutrition:   ? -appears to have good appetite, eating  100% ?8.  Diabetes mellitus.  Hemoglobin A1c 13.1.  Insulin therapy as directed with diabetic teaching. Educated regarding avoiding foods with added sugar.  ? CBG (last 3)  ?Recent Labs  ?  01/30/22 ?1701 01/30/22 ?2120 01/31/22 ?0647  ?GLUCAP 132* 159* 141*  ? ?Has moderate SSI ? 3/29: increase metformin to 568m BID and decrease insulin to 35U.  ?D/C metformin , fair control ?9.  Hypertension. Start Metoprolol 12.559mBID Continue Norvasc 10 mg daily.  Monitor with increased mobility. Increase magnesium gluconate to 5003mS.  ?Vitals:  ? 01/30/22 2034 01/31/22 0434  ?BP: (!) 142/72 135/76   ?Pulse: 79 72  ?Resp: 18 16  ?Temp: 98.9 ?F (37.2 ?C) 98.9 ?F (37.2 ?C)  ?SpO2: 99% 99%  ?Increase metoprolol to 32m23mD ?  ?10.  Hyperlipidemia.  Lipitor ?11.  Morbid obesity.  BMI greater than 56. Continue heart healthy/carb modified diet ?12. Vitamin D  deficiency: start ergocalciferol 50,000U once per week for 7 weeks ?13. Insomnia: resolved with melatonin 53m HS, will change to prn. Placed sign on door not to wake before 5am.  ?14. AKI: improving, place nursing order to encourage 6-8 glasses of water per day, repeat creatinine tomorrow.  ? 4/3- Last labs BUN of 21 and Cr 0.94- creat up, ,may be related to metform vs Septra plus hydration enc fluids , recheck on 4/7 ?15. Rash on posterior legs: hypoallergenic sheets and hydrocortisone cream ordered, resolved, d/c hydrocortisone  ?16. Tachycardia: start lopressor 12.545mBID on 3/30. - resolved ?17.  No further abd cramping likely due to  UTI ecoli >100K on Septra DS- hope to get final report in am  ? ? ? ?LOS: ?14 days ?A FACE TO FACE EVALUATION WAS PERFORMED ? ?AnLuanna Salkirsteins ?01/31/2022, 7:46 AM  ? ?  ?

## 2022-01-31 NOTE — Progress Notes (Signed)
Physical Therapy Session Note ? ?Patient Details  ?Name: Samantha [name] ?MRN: [id number] ?Date of Birth: [birth year] ? ?Today's Date: 01/31/2022 ?PT Individual Time: 1135-1200 ?PT Individual Time Calculation (min): 25 min  ? ?Short Term Goals: ?Week 1:  PT Short Term Goal 1 (Week 1): Pt will complete bed mobility with maxA of 1 person ?PT Short Term Goal 1 - Progress (Week 1): Not met ?PT Short Term Goal 2 (Week 1): Pt will complete bed<>chair transfer with maxA and LRAD ?PT Short Term Goal 2 - Progress (Week 1): Not met ?PT Short Term Goal 3 (Week 1): Pt will tolerate sitting in w/c >2 hours outside of therapy sessions ?PT Short Term Goal 3 - Progress (Week 1): Met ?PT Short Term Goal 4 (Week 1): pt will ambulate 31f with maxA and LRAD ?PT Short Term Goal 4 - Progress (Week 1): Not met ?Week 2:  PT Short Term Goal 1 (Week 2): Pt complete bed mobility with maxA of 1 person ?PT Short Term Goal 2 (Week 2): Pt will complete bed<>chair transfers with maxA of 1 person ?PT Short Term Goal 3 (Week 2): Pt will tolerate static standing for 3 minutes to prepare for functional mobility training ?PT Short Term Goal 4 (Week 2): Pt will ambulate 170fwith maxA of 1 person ? ?Skilled Therapeutic Interventions/Progress Updates:  ?  pt received in bed and agreeable to therapy. No complaint of pain. Pt performed supine>sit with max a for trunk management, +2 providing HHA to finish pulling up to sit. VC for technique throughout. Donned shoes with tot A. Pt performed slideboard transfer with mod A x 2, required RLE knee block and assist to clear hips. Pt transported to therapy gym for time management and energy conservation. Pt then performed squats 3 x 5, with seated rest breaks. Pt required min A x 2 for Sit to stand to RW, visual feedback from mirror, verbal cueing for posture and technique, knee/foot block throughout. Pt then returned to room and remained in w/c to await lunch. Pt was left with all needs in reach and alarm active.   ? ?Therapy Documentation ?Precautions:  ?Precautions ?Precautions: Fall ?Precaution Comments: R hemi (RUE > RLE) ?Restrictions ?Weight Bearing Restrictions: No ?General: ?  ? ? ? ?Therapy/Group: Individual Therapy ? ?Ol[place]4/02/2022, 12:36 PM  ?

## 2022-01-31 NOTE — Patient Care Conference (Signed)
Inpatient RehabilitationTeam Conference and Plan of Care Update ?Date: 01/31/2022   Time: 11:09 AM  ? ?Patient Name: Samantha [name]      ?Medical Record Number: [id number]  ?Date of Birth: December 21, 1955 ?Sex: Female         ?Room/Bed: 4M12C/4M12C-01 ?Payor Info: Payor: [employer] / Plan: [employer] [facility]  / Product Type: *No Product type* /   ? ?Admit Date/Time:  01/17/2022  5:11 PM ? ?Primary Diagnosis:  CVA (cerebral vascular accident) (HCC) ? ?Hospital Problems: Principal Problem: ?  CVA (cerebral vascular accident) (HCC) ? ? ? ?Expected Discharge Date: Expected Discharge Date: 02/13/22 ? ?Team Members Present: ?Physician leading conference: Dr. Claudette Laws ?Social Worker Present: Dossie Der, LCSW ?Nurse Present: Chana Bode, RN ?PT Present: Wynelle Link, PT ?OT Present: Dolphus Jenny, OT ?SLP Present: Other (comment) Sarita Bottom, SLP) ?PPS Coordinator present : Fae Pippin, SLP ? ?   Current Status/Progress Goal Weekly Team Focus  ?Bowel/Bladder ? ? Patient is incontinent of bladder/bowel , LBM 01/29/22     Assess toileting Q2hrs and prn,   ?Swallow/Nutrition/ Hydration ? ?           ?ADL's ? ? mod assist UB bathing/dressing seated at sink, mod assist-total assist toileting-performance fluctuates due to urge incontinence; sliding board transfer/squat pivot with mod assist; max to total assist LB dressing; poor participation this past week due to fatigue and loose stool; anxiety and poor endurance primary barriers as well  min assist- will downgrade this week due to slow progress  functional transfer training, hemi strategies, self care training, balance, FMC, pt education, dc planning   ?Mobility ? ? modA bed mobility, supervision sitting balance, min to modA for standing using the [place] depending on surface height, standing tolerance max 2 minutes in the [facility]. Some improvement in RLE strength (2/5 to 2+/5). Incontinent  minA but these will need to be downgraded with anticipated wheelchair level  RLE  NMR, functional transfers, endurance, activity tolerance, wheelchair mobility   ?Communication ? ?           ?Safety/Cognition/ Behavioral Observations ? Min to [employer] A  Problem-solving for complex daily situations - Min A; Memory - Supervision  Recall of medications, problem-solving with phone, money, iADL, and ADL tasks, sequencing   ?Pain ? ?       Continue to assess pain QS/PRN   ?Skin ? ? Skin intact  Rash resolving to bilateral legs- has hypo allergy linen  Skin assess QS and  PRN   ? ? ?Discharge Planning:  ?Husband has been in and observed pt in therapies, aware of wheelchair level and need for ramp into home. Very committed family will provide 24/7   ?Team Discussion: ?Patient with abd pain/UTI; treated with monurol x 6 days. Noted anxiety with activities, poor endurance, grimacing, frowning, etc. Patient notes she is overwhelmed and requests to return to bed. MD to start lexapro. ?Progress also limited by flexor tone. ? ?Patient on target to meet rehab goals: ?Currently needs mod assist for upper body bathing and dressing and mod assist for voiding (urge incontinence). Needs min - mod assist for standing and completes slide board transfers with min - mod assist . Only able to stand ~2 minutes. Also needs mod assist for communication and problem solving with min assist goals for discharge.  ? ?*See Care Plan and progress notes for long and short-term goals.  ? ?Revisions to Treatment Plan:  ?Downgraded OT goal ?Neuro psych referral ?  ?Teaching Needs: ?Safety, medications, transfers,  toileting, etc.  ?Current Barriers to Discharge: ?Decreased caregiver support and Home enviroment access/layout ? ?Possible Resolutions to Barriers: ?Spouse working on ramp for entry to home ?HH follow up services ?DME: w/c ?  ? ? Medical Summary ?Current Status: DM meds still requiring adjustment, anxiety with therapy ? Barriers to Discharge: Medical stability ?  ?Possible Resolutions to [employer] Focus: mood has been  labile , poor motivation ? ? ?Continued Need for Acute Rehabilitation Level of Care: The patient requires daily medical management by a physician with specialized training in physical medicine and rehabilitation for the following reasons: ?Direction of a multidisciplinary physical rehabilitation program to maximize functional independence : Yes ?Medical management of patient stability for increased activity during participation in an intensive rehabilitation regime.: Yes ?Analysis of laboratory values and/or radiology reports with any subsequent need for medication adjustment and/or medical intervention. : Yes ? ? ?I attest that I was present, lead the team conference, and concur with the assessment and plan of the team. ? ? ?Chana Bode B ?01/31/2022, 3:55 PM  ? ? ? ? ? ? ?

## 2022-01-31 NOTE — Progress Notes (Signed)
Denies discomfort pain, patient breakfast provided and states she will set herself up for her meal, patient is in good spirits, call bell placed within reach   ?

## 2022-02-01 LAB — GLUCOSE, CAPILLARY
Glucose-Capillary: 108 mg/dL — ABNORMAL HIGH (ref 70–99)
Glucose-Capillary: 192 mg/dL — ABNORMAL HIGH (ref 70–99)
Glucose-Capillary: 116 mg/dL — ABNORMAL HIGH (ref 70–99)
Glucose-Capillary: 132 mg/dL — ABNORMAL HIGH (ref 70–99)

## 2022-02-01 NOTE — Progress Notes (Signed)
Physical Therapy Session Note ? ?Patient Details  ?Name: Samantha [name] ?MRN: [id number] ?Date of Birth: 1956/06/28 ? ?Today's Date: 02/01/2022 ?PT Individual Time: 3500-9381 ?PT Individual Time Calculation (min): 26 min  ? ?Short Term Goals: ?Week 2:  PT Short Term Goal 1 (Week 2): Pt complete bed mobility with maxA of 1 person ?PT Short Term Goal 2 (Week 2): Pt will complete bed<>chair transfers with maxA of 1 person ?PT Short Term Goal 3 (Week 2): Pt will tolerate static standing for 3 minutes to prepare for functional mobility training ?PT Short Term Goal 4 (Week 2): Pt will ambulate 45ft with maxA of 1 person ? ?Skilled Therapeutic Interventions/Progress Updates:  ?   ?Pt supine in bed to start session. She was sleeping on arrival but awakens to voice and agreeable to therapy session, requesting bed level only due to fatigue. Attempted to encourage her to mobilize OOB to chair for lunch pt she politely refuses. Completed the following supine there-ex for RLE strengthening with PT needing to provide AAROM due to weakness and some motor planning deficits. ?-1x20 ankle DF/PF (partial range 2/5) ?-2x10 hip abduction  ?--2x10 heel slides ?-2x10 SAQ using pillow for bolster ?-1x20 glut sets ? ?*Pt with episode of urinary incontinence that occurred during glut sets. PT offered to assist with changing and cleaning but she politely requested for female staff to assist with cleaning and brief change. RN notified at end of session. ? ?Pt in bed with bed lowered, alarm on, all needs in reach. ? ?Therapy Documentation ?Precautions:  ?Precautions ?Precautions: Fall ?Precaution Comments: R hemi (RUE > RLE) ?Restrictions ?Weight Bearing Restrictions: No ?General: ?  ? ? ?Therapy/Group: Individual Therapy ? ?Samantha [name] ?02/01/2022, 9:14 AM  ?

## 2022-02-01 NOTE — Progress Notes (Signed)
Occupational Therapy Session Note ? ?Patient Details  ?Name: Samantha [name] ?MRN: [id number] ?Date of Birth: [birth year] ? ?Today's Date: 02/01/2022 ?OT Individual Time: 6144-3154 ?OT Individual Time Calculation (min): 45 min  ? ? ?Short Term Goals: ?Week 2:  OT Short Term Goal 1 (Week 2): Pt will donn/doff pants with mod assist ?OT Short Term Goal 2 (Week 2): Pt will complete stand pivot with mod assist ?OT Short Term Goal 3 (Week 2): Pt will complete toileting with max assist at [facility] level. ?OT Short Term Goal 4 (Week 2): Pt will bathe UB/LB using long handled sponge with mod assist. ? ?Skilled Therapeutic Interventions/Progress Updates:  ?  Pt semi reclined in bed, thanking this therapist for making doctor aware of her feelings of anxiety and discussed medication to manage with MD this morning.  Pt initially politely requesting to skip this session and just rest, but with encouragement pt agreeable to completing UB/LB dressing at EOB.  Pt completed right roll with min assist and sidelying to sit with mod assist.  Pt donned shorts with max assist at sit<>stand level and appearing anxious in standing with short breathing and clenching RW.  Pt returned to sitting with min assist and therapeutic use of self provided to lower pts anxiety level.  Pt reports feeling better afterwards.  Pt doffed/donned shirt using hemi strategy with min assist overall.  Pt asking to return to bed, sit to supine with max assist and repositioned toward [facility] with +2 assist.  Call bell in reach, bed alarm on. ? ?Therapy Documentation ?Precautions:  ?Precautions ?Precautions: Fall ?Precaution Comments: R hemi (RUE > RLE) ?Restrictions ?Weight Bearing Restrictions: No ? ? ? ?Therapy/Group: Individual Therapy ? ?Samantha [name] ?02/01/2022, 12:38 PM ?

## 2022-02-01 NOTE — Progress Notes (Signed)
?                                                       PROGRESS NOTE ? ? ?Subjective/Complaints: ? ?Discussed anxiety and depression and lexapro, pt in favor of this  ? ?ROS:  ?Pt denies SOB, abd pain, CP, N/V/C/D, and vision changes ? ?Objective: ?  ?No results found. ?Recent Labs  ?  01/30/22 ?[id number]  ?WBC 8.8  ?HGB 10.3*  ?HCT 32.9*  ?PLT 195  ? ? ? ? ?Recent Labs  ?  01/30/22 ?[id number]  ?NA 142  ?K 4.3  ?CL 110  ?CO2 24  ?GLUCOSE 134*  ?BUN 27*  ?CREATININE 1.55*  ?CALCIUM 9.0  ? ? ? ? ?Intake/Output Summary (Last 24 hours) at 02/01/2022 0757 ?Last data filed at 02/01/2022 0100 ?Gross per 24 hour  ?Intake 360 ml  ?Output --  ?Net 360 ml  ? ?  ? ?  ? ?Physical Exam: ?Vital Signs ?Blood pressure (!) 156/65, pulse 70, temperature 98.6 ?F (37 ?C), resp. rate 18, height 5' (1.524 m), weight 129.7 kg, SpO2 96 %. ? ? ?General: No acute distress ?Mood and affect are appropriate ?Heart: Regular rate and rhythm no rubs murmurs or extra sounds ?Lungs: Clear to auscultation, breathing unlabored, no rales or wheezes ?Abdomen: Positive bowel sounds, soft nontender to palpation, nondistended ?Extremities: No clubbing, cyanosis, or edema ?Skin: No evidence of breakdown, no evidence of rash ? ?Psych: pleasant and cooperative  ? ?Neurological:  ?   Comments: Patient is alert.  No acute distress.  Makes eye contact with examiner.  Oriented to person and place.  Fair awareness of deficits. RUE remains 0/5, RLE 1-2/5, Left side 5/5. Sensation intact. ? ? ?Assessment/Plan: ?1. Functional deficits which require 3+ hours per day of interdisciplinary therapy in a comprehensive inpatient rehab setting. ?Physiatrist is providing close team supervision and 24 hour management of active medical problems listed below. ?Physiatrist and rehab team continue to assess barriers to discharge/monitor patient progress toward functional and medical goals ? ?Care Tool: ? ?Bathing ?   ?Body parts bathed by patient: Face, Left upper leg, Chest, Right upper leg   ? Body parts bathed by helper: Right arm, Left arm, Front perineal area, Abdomen, Buttocks, Right lower leg, Left lower leg ?  ?  ?Bathing assist Assist Level: Maximal Assistance - Patient 24 - 49% ?  ?  ?Upper Body Dressing/Undressing ?Upper body dressing   ?What is the patient wearing?: Bra, Pull over shirt ?   ?Upper body assist Assist Level: Maximal Assistance - Patient 25 - 49% ?   ?Lower Body Dressing/Undressing ?Lower body dressing ? ? ?   ?What is the patient wearing?: Pants, Incontinence brief ? ?  ? ?Lower body assist Assist for lower body dressing: Maximal Assistance - Patient 25 - 49% ?   ? ?Toileting ?Toileting    ?Toileting assist Assist for toileting: Maximal Assistance - Patient 25 - 49% ?  ?  ?Transfers ?Chair/bed transfer ? ?Transfers assist ? Chair/bed transfer activity did not occur: Safety/medical concerns ? ?Chair/bed transfer assist level: Total Assistance - Patient < 25% ?  ?  ?Locomotion ?Ambulation ? ? ?Ambulation assist ? ? Ambulation activity did not occur: Safety/medical concerns ? ?  ?  ?   ? ?Walk 10 feet activity ? ? ?Assist ? Walk 10  feet activity did not occur: Safety/medical concerns ? ?  ?   ? ?Walk 50 feet activity ? ? ?Assist Walk 50 feet with 2 turns activity did not occur: Safety/medical concerns ? ?  ?   ? ? ?Walk 150 feet activity ? ? ?Assist Walk 150 feet activity did not occur: Safety/medical concerns ? ?  ?  ?  ? ?Walk 10 feet on uneven surface  ?activity ? ? ?Assist Walk 10 feet on uneven surfaces activity did not occur: Safety/medical concerns ? ? ?  ?   ? ?Wheelchair ? ? ? ? ?Assist Is the patient using a wheelchair?: Yes ?Type of Wheelchair: Manual ?  ? ?Wheelchair assist level: Dependent - Patient 0% ?Max wheelchair distance: 0  ? ? ?Wheelchair 50 feet with 2 turns activity ? ? ? ?Assist ? ?  ?  ? ? ?Assist Level: Dependent - Patient 0%  ? ?Wheelchair 150 feet activity  ? ? ? ?Assist ?   ? ? ?Assist Level: Dependent - Patient 0%  ? ?Blood pressure (!) 156/65, pulse  70, temperature 98.6 ?F (37 ?C), resp. rate 18, height 5' (1.524 m), weight 129.7 kg, SpO2 96 %. ? ?Medical Problem List and Plan: ?1. 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            -patient may shower ?            -ELOS/Goals: 14-21 days MinA PT, S OT, S SLP,  ?            -HFU scheduled ? -Continue CIR- PT, OT and SLP    ?2.  Left lower extremity DVTs: Eliquis started. Discussed cost with patient and wife ?            -antiplatelet therapy: Aspirin 325 mg daily and Plavix 75 mg daily x3 months then aspirin alone ?3. Pain Management: N/A ?4. Mood: Provide emotional support ?            -antipsychotic agents: N/A ?5. Neuropsych: This patient is capable of making decisions on her own behalf. ?6. Skin/Wound Care: Routine skin checks, diarrhea related perianal irritation, order Tucks  ?7. Fluids/Electrolytes/Nutrition:   ? -appears to have good appetite, eating  100% ?8.  Diabetes mellitus.  Hemoglobin A1c 13.1.  Insulin therapy as directed with diabetic teaching. Educated regarding avoiding foods with added sugar.  ? CBG (last 3)  ?Recent Labs  ?  01/31/22 ?1716 01/31/22 ?2103 02/01/22 ?0622  ?GLUCAP 103* 158* 108*  ? ?Has moderate SSI ? 3/29: increase metformin to 500mg  BID and decrease insulin to 35U.  ?D/C metformin , fair control ?9.  Hypertension. Metoprolol 25mg  BID Continue Norvasc 10 mg daily.  Monitor with increased mobility. ?Vitals:  ? 01/31/22 2106 02/01/22 0620  ?BP: (!) 141/51 (!) 156/65  ?Pulse: 70 70  ?Resp:  18  ?Temp:  98.6 ?F (37 ?C)  ?SpO2:  96%  ?May need to increase metoprolol as long as HR > 65 ?  ?10.  Hyperlipidemia.  Lipitor ?11.  Morbid obesity.  BMI greater than 56. Continue heart healthy/carb modified diet ?12. Vitamin D deficiency: start ergocalciferol 50,000U once per week for 7 weeks ?13.  Insomnia: resolved with melatonin 3mg  HS, will change to prn. Placed sign on door not to wake before 5am.  ?14. AKI: improving, place nursing order  to encourage 6-8 glasses of water per day, repeat creatinine tomorrow.  ? 4/3- Last labs BUN of 21 and Cr 0.94- creat up, ,may be related to metform vs Septra plus hydration enc fluids , recheck on 4/7 ?15. Rash on posterior legs: hypoallergenic sheets and hydrocortisone cream ordered, resolved, d/c hydrocortisone  ?16. Tachycardia: start lopressor 12.5mg  BID on 3/30. - resolved ?17.  No further abd cramping likely due to  UTI ecoli >100K on Septra DS- but changed to fosfomycin due to resistance  ? ? ? ?LOS: ?15 days ?A FACE TO FACE EVALUATION WAS PERFORMED ? ?Victorino Sparrow Darnell Stimson ?02/01/2022, 7:57 AM  ? ?  ?

## 2022-02-01 NOTE — Progress Notes (Incomplete)
Incontinent  of bladder ,urine has a very foul odor, incont care provided, patient continue to refuse po liquids, educated earlier important of po liquids secondary to UTI.Patient removed hand splint, she stated that her doctor told her she can remove the splint if it hurts her hand. ?

## 2022-02-01 NOTE — Progress Notes (Incomplete)
Patient resting in bed, unable to locate patient hand splint , will need to reorder., states that it hurts her hand when she wears it  ?

## 2022-02-01 NOTE — Progress Notes (Addendum)
Physical Therapy Session Note ? ?Patient Details  ?Name: Samantha [name] ?MRN: [id number] ?Date of Birth: 10/04/1956 ? ?Today's Date: 02/01/2022 ?PT Individual Time: 1300-1400 ?PT Individual Time Calculation (min): 60 min  ? ?Short Term Goals: ?Week 2:  PT Short Term Goal 1 (Week 2): Pt complete bed mobility with maxA of 1 person ?PT Short Term Goal 2 (Week 2): Pt will complete bed<>chair transfers with maxA of 1 person ?PT Short Term Goal 3 (Week 2): Pt will tolerate static standing for 3 minutes to prepare for functional mobility training ?PT Short Term Goal 4 (Week 2): Pt will ambulate 10ft with maxA of 1 person ? ?Skilled Therapeutic Interventions/Progress Updates:  ?   ? ?Direct handoff of care from NT who was assisting pt with transferring using the Stedy. Pt transported to day room rehab gym with totalA for time and energy conservation. In rehab gym, pt participated in TR set activities (Easter theme) while seated in her w/c. These included problem solving tasks for building animals out of cloth, recreating crafts, etc. She was unable to functionally use her RUE to assist and required therapist assist throughout. These challenged fine motor skills, problem solving, awareness, and really improving her spirits. We then worked on BLE strengthening using the Kinetron while seated in w/c - completed sets of 20 steps with rest breaks needed due to fatigue. She also required assist for stabilizing RLE in neutral due to "frog legging" on R.  Pt returned to her room and remained seated in w/c with safety belt alarm on , RUE in trough, all needs met.  ? ?Therapy Documentation ?Precautions:  ?Precautions ?Precautions: Fall ?Precaution Comments: R hemi (RUE > RLE) ?Restrictions ?Weight Bearing Restrictions: No ?General: ?  ? ? ?Therapy/Group: Individual Therapy ? ?Christian P Manhard PT ?02/01/2022, 1:57 PM  ?

## 2022-02-01 NOTE — Progress Notes (Signed)
Speech Language Pathology Weekly Progress ? ?Patient Details  ?Name: Brynlynn Shartzer ?MRN: [id number] ?Date of Birth: [birth year] ? ?Beginning of progress report period: January 25, 2022 ?End of progress report period: February 01, 2022 ? ?Today's Date: 02/01/2022 ?  ?Short Term Goals: ?Week 2: SLP Short Term Goal 1 (Week 2): To improve awareness and self-advocacy, patient will recall 3 pieces of information relevant to IPR admission given Min verbal for use of internal and/or external memory aids. ?SLP Short Term Goal 1 - Progress (Week 2): Not met ?SLP Short Term Goal 2 (Week 2): Pt will correctly place/organize 3 different medications in pill organizer given Mod verbal and visual A. ?SLP Short Term Goal 2 - Progress (Week 2): Not met ?SLP Short Term Goal 3 (Week 2): Patient will attend to right field of enviornment/body during functional tasks with Min verbal cues. ?SLP Short Term Goal 3 - Progress (Week 2): Met ? ?  ?New Short Term Goals: ?Week 3: SLP Short Term Goal 1 (Week 3): Pt will correctly place/organize 3 different medications in pill organizer given Mod verbal and visual A. ?SLP Short Term Goal 2 (Week 3): To improve awareness and self-advocacy, patient will recall 3 pieces of information relevant to IPR admission given Min verbal for use of internal and/or external memory aids. ?SLP Short Term Goal 3 (Week 3): Pt will complete divergent and convergent verbal reasoning task with 80% accuracy given Min A. ? ?Weekly Progress Updates:Pt has demonstrated minimal progress this week; met 1 out of 3 short-term goals. Barriers include severity of deficits and intermittent refusal and fatigue. Education of compensatory memory strategies is ongoing. Will continue to provide ST intervention during CIR admission to maximize pt's independence and decrease caregiver burden upon discharge. ? ? ?Intensity: Minumum of 1-2 x/day, 30 to 90 minutes ?Frequency: 3 to 5 out of 7 days ?Duration/Length of Stay: 3  weeks ?Treatment/Interventions: Cognitive remediation/compensation;Internal/external aids;Cueing hierarchy;Medication managment;Therapeutic Activities;Functional tasks;Patient/family education ? ? ?Dahna Hattabaugh A Caelan Atchley ?02/01/2022, 7:15 PM ? ? ? ? ? ? ?

## 2022-02-02 LAB — BASIC METABOLIC PANEL
Anion gap: 7 (ref 5–15)
BUN: 22 mg/dL (ref 8–23)
CO2: 24 mmol/L (ref 22–32)
Calcium: 9.1 mg/dL (ref 8.9–10.3)
Chloride: 110 mmol/L (ref 98–111)
Creatinine, Ser: 1.22 mg/dL — ABNORMAL HIGH (ref 0.44–1.00)
GFR, Estimated: 50 mL/min — ABNORMAL LOW (ref >60)
Glucose, Bld: 110 mg/dL — ABNORMAL HIGH (ref 70–99)
Potassium: 4.4 mmol/L (ref 3.5–5.1)
Sodium: 141 mmol/L (ref 135–145)

## 2022-02-02 LAB — GLUCOSE, CAPILLARY
Glucose-Capillary: 106 mg/dL — ABNORMAL HIGH (ref 70–99)
Glucose-Capillary: 174 mg/dL — ABNORMAL HIGH (ref 70–99)
Glucose-Capillary: 179 mg/dL — ABNORMAL HIGH (ref 70–99)
Glucose-Capillary: 99 mg/dL (ref 70–99)

## 2022-02-02 MED ORDER — METOPROLOL TARTRATE 50 MG PO TABS
50.0000 mg | ORAL_TABLET | Freq: Two times a day (BID) | ORAL | Status: DC
  Administered 2022-02-02 – 2022-02-13 (×22): 50 mg via ORAL
  Filled 2022-02-02 (×22): qty 1

## 2022-02-02 NOTE — Progress Notes (Addendum)
?                                                       PROGRESS NOTE ? ? ?Subjective/Complaints: ? ?Labs reviewed , pt feels anxiety a little better, slept well ? ?ROS:  ?Pt denies SOB, abd pain, CP, N/V/C/D, and vision changes ? ?Objective: ?  ?No results found. ?No results for input(s): WBC, HGB, HCT, PLT in the last 72 hours. ? ? ? ?Recent Labs  ?  02/02/22 ?0647  ?NA 141  ?K 4.4  ?CL 110  ?CO2 24  ?GLUCOSE 110*  ?BUN 22  ?CREATININE 1.22*  ?CALCIUM 9.1  ? ? ? ? ?Intake/Output Summary (Last 24 hours) at 02/02/2022 0803 ?Last data filed at 02/01/2022 2000 ?Gross per 24 hour  ?Intake 700 ml  ?Output --  ?Net 700 ml  ? ?  ? ?  ? ?Physical Exam: ?Vital Signs ?Blood pressure (!) 149/72, pulse 71, temperature 99 ?F (37.2 ?C), temperature source Oral, resp. rate 18, height 5' (1.524 m), weight 129.7 kg, SpO2 96 %. ? ? ?General: No acute distress ?Mood and affect are appropriate ?Heart: Regular rate and rhythm no rubs murmurs or extra sounds ?Lungs: Clear to auscultation, breathing unlabored, no rales or wheezes ?Abdomen: Positive bowel sounds, soft nontender to palpation, nondistended ?Extremities: No clubbing, cyanosis, or edema ?Skin: No evidence of breakdown, no evidence of rash ? ?Psych: pleasant and cooperative  ? ?Neurological:  ?   Comments: Patient is alert.  No acute distress.  Makes eye contact with examiner.  Oriented to person and place.  Fair awareness of deficits. RUE remains 0/5, RLE 1-2/5, Left side 5/5. Sensation intact. ? ? ?Assessment/Plan: ?1. Functional deficits which require 3+ hours per day of interdisciplinary therapy in a comprehensive inpatient rehab setting. ?Physiatrist is providing close team supervision and 24 hour management of active medical problems listed below. ?Physiatrist and rehab team continue to assess barriers to discharge/monitor patient progress toward functional and medical goals ? ?Care Tool: ? ?Bathing ?   ?Body parts bathed by patient: Face, Left upper leg, Chest, Right upper  leg  ? Body parts bathed by helper: Right arm, Left arm, Front perineal area, Abdomen, Buttocks, Right lower leg, Left lower leg ?  ?  ?Bathing assist Assist Level: Maximal Assistance - Patient 24 - 49% ?  ?  ?Upper Body Dressing/Undressing ?Upper body dressing   ?What is the patient wearing?: Bra, Pull over shirt ?   ?Upper body assist Assist Level: Maximal Assistance - Patient 25 - 49% ?   ?Lower Body Dressing/Undressing ?Lower body dressing ? ? ?   ?What is the patient wearing?: Pants, Incontinence brief ? ?  ? ?Lower body assist Assist for lower body dressing: Maximal Assistance - Patient 25 - 49% ?   ? ?Toileting ?Toileting    ?Toileting assist Assist for toileting: Maximal Assistance - Patient 25 - 49% ?  ?  ?Transfers ?Chair/bed transfer ? ?Transfers assist ? Chair/bed transfer activity did not occur: Safety/medical concerns ? ?Chair/bed transfer assist level: Total Assistance - Patient < 25% ?  ?  ?Locomotion ?Ambulation ? ? ?Ambulation assist ? ? Ambulation activity did not occur: Safety/medical concerns ? ?  ?  ?   ? ?Walk 10 feet activity ? ? ?Assist ? Walk 10 feet activity did not occur: Safety/medical  concerns ? ?  ?   ? ?Walk 50 feet activity ? ? ?Assist Walk 50 feet with 2 turns activity did not occur: Safety/medical concerns ? ?  ?   ? ? ?Walk 150 feet activity ? ? ?Assist Walk 150 feet activity did not occur: Safety/medical concerns ? ?  ?  ?  ? ?Walk 10 feet on uneven surface  ?activity ? ? ?Assist Walk 10 feet on uneven surfaces activity did not occur: Safety/medical concerns ? ? ?  ?   ? ?Wheelchair ? ? ? ? ?Assist Is the patient using a wheelchair?: Yes ?Type of Wheelchair: Manual ?  ? ?Wheelchair assist level: Dependent - Patient 0% ?Max wheelchair distance: 0  ? ? ?Wheelchair 50 feet with 2 turns activity ? ? ? ?Assist ? ?  ?  ? ? ?Assist Level: Dependent - Patient 0%  ? ?Wheelchair 150 feet activity  ? ? ? ?Assist ?   ? ? ?Assist Level: Dependent - Patient 0%  ? ?Blood pressure (!) 149/72,  pulse 71, temperature 99 ?F (37.2 ?C), temperature source Oral, resp. rate 18, height 5' (1.524 m), weight 129.7 kg, SpO2 96 %. ? ?Medical Problem List and Plan: ?1. 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            -patient may shower ?            -ELOS/Goals: 4/18 MinA PT, S OT, S SLP,  ?            -HFU scheduled ? -Continue CIR- PT, OT and SLP    ?2.  Left lower extremity DVTs: Eliquis started. Discussed cost with patient and wife ?            -antiplatelet therapy: Aspirin 325 mg daily and Plavix 75 mg daily x3 months then aspirin alone ?3. Pain Management: N/A ?4. Mood: Provide emotional support ?            -antipsychotic agents: N/A ?5. Neuropsych: This patient is capable of making decisions on her own behalf. ?6. Skin/Wound Care: Routine skin checks, diarrhea related perianal irritation, order Tucks  ?7. Fluids/Electrolytes/Nutrition:   ? -appears to have good appetite, eating  100% ?8.  Diabetes mellitus.  Hemoglobin A1c 13.1.  Insulin therapy as directed with diabetic teaching. Educated regarding avoiding foods with added sugar.  ? CBG (last 3)  ?Recent Labs  ?  02/01/22 ?1635 02/01/22 ?2048 02/02/22 ?0610  ?GLUCAP 116* 132* 106*  ? ?Has moderate SSI ? 3/29: increase metformin to 500mg  BID and decrease insulin to 35U.  ?Good control 4/7 ?9.  Hypertension. Metoprolol 25mg  BID Continue Norvasc 10 mg daily.  Monitor with increased mobility. ?Vitals:  ? 02/01/22 2012 02/02/22 0552  ?BP: (!) 159/71 (!) 149/72  ?Pulse: 70 71  ?Resp: 18 18  ?Temp: 98.8 ?F (37.1 ?C) 99 ?F (37.2 ?C)  ?SpO2: 98% 96%  ?May need to increase metoprolol to 50mg  BID  as long as HR > 60 ?  ?10.  Hyperlipidemia.  Lipitor ?11.  Morbid obesity.  BMI greater than 56. Continue heart healthy/carb modified diet ?12. Vitamin D deficiency: start  ergocalciferol 50,000U once per week for 7 weeks ?13. Insomnia: resolved with melatonin 3mg  HS, will change to prn. Placed sign on door not to wake before 5am.  ?14. AKI: improving, place nursing  order to encourage 6-8 glasses of water per day, repeat creatinine tomorrow.  ? 4/3- Last labs BUN of 21 and Cr 0.94- creat up, ,may be related to metform vs Septra plus hydration enc fluids , recheck on 4/7 ?15. Rash on posterior legs: hypoallergenic sheets and hydrocortisone cream ordered, resolved, d/c hydrocortisone  ?16. Tachycardia: start lopressor 12.5mg  BID on 3/30. - resolved ?17.  No further abd cramping likely due to  UTI ecoli >100K on Septra DS- but changed to fosfomycin due to resistance has 1 more dose  ? ? ? ?LOS: ?16 days ?A FACE TO FACE EVALUATION WAS PERFORMED ? ?Victorino Sparrowndrew E Eller Sweis ?02/02/2022, 8:03 AM  ? ?  ?

## 2022-02-02 NOTE — Plan of Care (Signed)
The following OT LTGs have been downgraded due to slow progress displayed: ?Problem: RH Grooming ?Goal: LTG Patient will perform grooming w/assist,cues/equip (OT) ?Description: LTG: Patient will perform grooming with assist, with/without cues using equipment (OT) ?Flowsheets (Taken 02/02/2022 1144) ?LTG: Pt will perform grooming with assistance level of: Minimal Assistance - Patient > 75% ?  ?Problem: RH Bathing ?Goal: LTG Patient will bathe all body parts with assist levels (OT) ?Description: LTG: Patient will bathe all body parts with assist levels (OT) ?Flowsheets (Taken 02/02/2022 1144) ?LTG: Pt will perform bathing with assistance level/cueing: Moderate Assistance - Patient 50 - 74% ?LTG: Position pt will perform bathing: Shower ?  ?Problem: RH Dressing ?Goal: LTG Patient will perform lower body dressing w/assist (OT) ?Description: LTG: Patient will perform lower body dressing with assist, with/without cues in positioning using equipment (OT) ?Flowsheets (Taken 02/02/2022 1144) ?LTG: Pt will perform lower body dressing with assistance level of: Moderate Assistance - Patient 50 - 74% ?  ?Problem: RH Toileting ?Goal: LTG Patient will perform toileting task (3/3 steps) with assistance level (OT) ?Description: LTG: Patient will perform toileting task (3/3 steps) with assistance level (OT)  ?Flowsheets (Taken 02/02/2022 1144) ?LTG: Pt will perform toileting task (3/3 steps) with assistance level: Maximal Assistance - Patient 25 - 49% ?  ?Problem: RH Toilet Transfers ?Goal: LTG Patient will perform toilet transfers w/assist (OT) ?Description: LTG: Patient will perform toilet transfers with assist, with/without cues using equipment (OT) ?Flowsheets (Taken 02/02/2022 1144) ?LTG: Pt will perform toilet transfers with assistance level of: Moderate Assistance - Patient 50 - 74% ?  ?

## 2022-02-02 NOTE — Progress Notes (Signed)
Occupational Therapy Weekly Progress Note ? ?Patient Details  ?Name: Samantha [name] ?MRN: [id number] ?Date of Birth: Oct 03, 1956 ? ?Beginning of progress report period: January 25, 2022 ?End of progress report period: February 02, 2022 ? ?Today's Date: 02/02/2022 ?OT Individual Time: 2297-9892 ?OT Individual Time Calculation (min): 75 min  ? ? ?Patient has met 3 of 4 short term goals.  She has made recent gains and therefore met her short term goals however overall is progressing slowly and requires downgrade of long term goals.  Pt has been limited by anxiety related to stress incontinence and therefore is apprehensive to participate in functional mobility.  Therapy has collaborated with interdisciplinary team to address anxiety and incontinence through medication management, timed toileting, and therapeutic use of self during therapy sessions.  Pt is showing recent follow through of hemi strategies during bathing and dressing.  Pt currently requires max assist for LB dressing and bathing due to pt having difficulties with RUE weakness, body habitus negotiation, and impaired standing endurance.  She is min assist for UB dressing and bathing using hemi techniques and long handled sponge.   ? ?Patient continues to demonstrate the following deficits: muscle weakness, muscle joint tightness, and muscle paralysis, decreased cardiorespiratoy endurance, abnormal tone, unbalanced muscle activation, motor apraxia, decreased coordination, and decreased motor planning, right side neglect, decreased initiation, decreased problem solving, and delayed processing, and decreased sitting balance, decreased standing balance, decreased postural control, hemiplegia, and decreased balance strategies and therefore will continue to benefit from skilled OT intervention to enhance overall performance with BADL and iADL. ? ?Patient not progressing toward long term goals.  See goal revision. In Plan of Care. ? ?OT Short Term Goals ?Week 2:  OT Short  Term Goal 1 (Week 2): Pt will donn/doff pants with mod assist ?OT Short Term Goal 2 (Week 2): Pt will complete stand pivot with mod assist ?OT Short Term Goal 2 - Progress (Week 2): Progressing toward goal ?OT Short Term Goal 3 (Week 2): Pt will complete toileting with max assist at [facility] level. ?OT Short Term Goal 3 - Progress (Week 2): Met ?OT Short Term Goal 4 (Week 2): Pt will bathe UB/LB using long handled sponge with mod assist. ?OT Short Term Goal 4 - Progress (Week 2): Met ?Week 3:  OT Short Term Goal 1 (Week 3): Pt will complete BSC transfer with mod assist squat pivot. ?OT Short Term Goal 2 (Week 3): Pt will complete LB dressing with mod assist at sit<>stand level using RW. ?OT Short Term Goal 3 (Week 3): Pt will tolerate standing for > 60 seconds in prep for LB self care. ? ?Skilled Therapeutic Interventions/Progress Updates:  ?  Pt semi reclined in bed, reports she slept well and agreeable to taking a shower.  Pt completed right roll and sidelying to sit with mod assist.  Stedy transfer with min assist for sit<>stand and transported to bariatric commode.  Pt required max assist for clothing mangement and pericare at stedy.  Pt transported from commode to shower bench using stedy with min assist for sit<>stand.  Pt doffed shirt with supervision.  Bathed UB and LB seated (not including buttocks) using long handled sponge with mod assist.  Sit<>stand at stedy with min assist and transported to EOB.  Pt donned shirt with min assist using hemi strategy.  Donned brief and pants with max assist at RW sit<>stand level.  Pt asking to return to bed due to fatigue.  Sit to supine with mod assist.  Call bell in  reach, RUE supported with pillows, bed alarm on. ? ?Therapy Documentation ?Precautions:  ?Precautions ?Precautions: Fall ?Precaution Comments: R hemi (RUE > RLE) ?Restrictions ?Weight Bearing Restrictions: No ? ? ?Therapy/Group: Individual Therapy ? ?Caryl Asp Elric Tirado ?02/02/2022, 11:40 AM  ?

## 2022-02-02 NOTE — Progress Notes (Signed)
Physical Therapy Weekly Progress Note ? ?Patient Details  ?Name: Samantha [name] ?MRN: [id number] ?Date of Birth: 07-01-1956 ? ?Beginning of progress report period: January 25, 2022 ?End of progress report period: February 02, 2022 ? ?Today's Date: 02/02/2022 ?PT Individual Time: 4917-9150 ?PT Individual Time Calculation (min): 54 min  ? ?Patient has met 2 of 4 short term goals.  Pt has made slow progress this reporting period. She has had a few refusals during therapy, related to generalized fatigue and stomach pains. Overall, she requires maxA for bed mobility, supervision for unsupported sitting balance, mod to maxA for squat<>pivot transfers and modA for sliding board transfers. She has been able to complete sit<>stands with as little as minA but more consistently requires modA. Her standing tolerance continues to be max 2 minutes before fatigue. She has ambulating 32f using LiteGait overground and +2 assist for managing LiteGait but only min/modA needed for facilitating gait mechanics. She is showing return in her RLE > RUE, grossly 2+ to 3-/5 throughout lower extremity. She has completed the FIST (function in sitting test) on 4/4 where she cored 46/56 - most difficulty with scooting A/P. Continue to anticipate wheelchair level at discharge - husband as confirmed ramp is being built and he has been asked to measure doorways to ensure a 24inch wheelchair will fit.  ? ?Patient continues to demonstrate the following deficits muscle weakness and muscle joint tightness, decreased cardiorespiratoy endurance, abnormal tone, unbalanced muscle activation, motor apraxia, and decreased motor planning, decreased initiation, decreased awareness, decreased problem solving, decreased safety awareness, and decreased memory, and decreased sitting balance, decreased standing balance, decreased postural control, hemiplegia, and decreased balance strategies and therefore will continue to benefit from skilled PT intervention to increase  functional independence with mobility. ? ?Patient progressing toward long term goals..  Continue plan of care. ? ?PT Short Term Goals ?Week 2:  PT Short Term Goal 1 (Week 2): Pt complete bed mobility with maxA of 1 person ?PT Short Term Goal 1 - Progress (Week 2): Met ?PT Short Term Goal 2 (Week 2): Pt will complete bed<>chair transfers with maxA of 1 person ?PT Short Term Goal 2 - Progress (Week 2): Met ?PT Short Term Goal 3 (Week 2):  (2 minutes) ?PT Short Term Goal 3 - Progress (Week 2): Not met ?PT Short Term Goal 4 (Week 2): Pt will ambulate 167fwith maxA of 1 person ?PT Short Term Goal 4 - Progress (Week 2): Not met ?Week 3:  PT Short Term Goal 1 (Week 3): Pt will complete bed mobility with modA ?PT Short Term Goal 2 (Week 3): Pt will complete bed<>chair transfers with modA and LRAD ?PT Short Term Goal 3 (Week 3): Pt will tolerate standing for 3 minutes to prepare for functional mobility ?PT Short Term Goal 4 (Week 3): Pt will ambulate 104fith maxA and LRAD ? ?Skilled Therapeutic Interventions/Progress Updates:  ?   ?Pt presenting sitting in w/c and agreeable to therapy session. Denies pain.  ? ?Transported to day room rehab gym for time where remainder of session focused on gait training using LiteGait overground with no BWS and BUE support. Ace wrapped her R foot for DF assist during gait. Harness donned/doffed in sitting for safety. Sit<>stand to LiteGait with only minA (!) and R knee blocked for safety. She ambulated [id number] 67fith +3 assist (tech Physiological scientistech providing w/c follow for safety). With gait, she primarily needed assistance for advancing RLE with limited hip/knee flexion resulting in  poor foot clearance and decreased heel strike. He was able to adequately weight shift to offload during swing and demonstrated fair quad activation during stance with buckling only occurring during last few feet of gait due to fatigue. She required encouragement and motivation during  treatment session as she fatigues so quickly.  ? ?She requested to return to bed at end of session due to fatigue. Completed stand<>pivot transfer with modA back to bed with her R knee buckling during transfer. Required maxA for sit>supine for trunk and BLE support and +2 assist for scooting to [facility] for repositioning. Noted bed difficulties with inability to level out bed - LPN notified at end of session, requesting new hospital bed.  ? ?Therapy Documentation ?Precautions:  ?Precautions ?Precautions: Fall ?Precaution Comments: R hemi (RUE > RLE) ?Restrictions ?Weight Bearing Restrictions: No ?General: ?  ? ?Therapy/Group: Individual Therapy ? ?Samantha [name] ?02/02/2022, 7:37 AM  ?

## 2022-02-02 NOTE — Progress Notes (Signed)
Occupational Therapy Session Note ? ?Patient Details  ?Name: Samantha [name] ?MRN: [id number] ?Date of Birth: 1956-04-28 ? ?Today's Date: 02/02/2022 ?OT Individual Time: 8934-0684 ?OT Individual Time Calculation (min): 47 min  ? ? ?Short Term Goals: ?Week 1:  OT Short Term Goal 1 (Week 1): Pt will complete donning/doffing shirt with mod assist ?OT Short Term Goal 1 - Progress (Week 1): Met ?OT Short Term Goal 2 (Week 1): Pt will donn/doff pants with mod assist ?OT Short Term Goal 2 - Progress (Week 1): Progressing toward goal ?OT Short Term Goal 3 (Week 1): Pt will complete stand pivot with mod assist ?OT Short Term Goal 3 - Progress (Week 1): Progressing toward goal ?OT Short Term Goal 4 (Week 1): Pt will complete toileting with max assist at [facility] level. ?OT Short Term Goal 4 - Progress (Week 1): Progressing toward goal ?Week 2:  OT Short Term Goal 1 (Week 2): Pt will donn/doff pants with mod assist ?OT Short Term Goal 2 (Week 2): Pt will complete stand pivot with mod assist ?OT Short Term Goal 3 (Week 2): Pt will complete toileting with max assist at [facility] level. ?OT Short Term Goal 4 (Week 2): Pt will bathe UB/LB using long handled sponge with mod assist. ? ?Skilled Therapeutic Interventions/Progress Updates:  ?  Pt received seated in w/c finishing lunch, no c/o pain, agreeable to therapy. Session focus on RUE NMR in prep for improved ADL/IADL/func mobility performance + decreased caregiver burden. Pt only self-fed few bites of lunch before stating she was finished. Total A w/c transport to and from gym. Total A to don B shoes. ? ?Pt  participated in various activities to promote RUE/WB in prep for improved functional return including 1x15 forward/diagonal towel slides, forearm pulses, chest press with 1 lb dowel rod/ace wrap to support R grip. ? ?1:1 NMES applied to R digit/wrist flexors/extensors at the below settings: ? ?Ratio 1:3 ?Rate 35 pps ?Waveform- Asymmetric ?Ramp 1.0 ?Pulse 300 ?Intensity-  18 clicks ?Duration  -   5 min, 5 min ?No adverse reactions after treatment and is skin intact. Intensity adjusted to tolerance. Pt able to return demonstration of self PROM to R digits/wrist during activity and able to facilitate grip on soft sponge. Educated on importance of performing self-PROM throughout day to prevent potential for contracture/maintain skin hygiene, verbalized good understanding. ? ? ?Pt left seated in w/c awaiting following PT session, call bell in reach, and all immediate needs met.  ? ? ?Therapy Documentation ?Precautions:  ?Precautions ?Precautions: Fall ?Precaution Comments: R hemi (RUE > RLE) ?Restrictions ?Weight Bearing Restrictions: No ? ?Pain: no c/o throughout ?  ?ADL: See Care Tool for more details. ?Therapy/Group: Individual Therapy ? ?Volanda Napoleon MS, OTR/L ? ?02/02/2022, 6:46 AM ?

## 2022-02-03 LAB — GLUCOSE, CAPILLARY
Glucose-Capillary: 109 mg/dL — ABNORMAL HIGH (ref 70–99)
Glucose-Capillary: 158 mg/dL — ABNORMAL HIGH (ref 70–99)
Glucose-Capillary: 158 mg/dL — ABNORMAL HIGH (ref 70–99)
Glucose-Capillary: 116 mg/dL — ABNORMAL HIGH (ref 70–99)

## 2022-02-03 MED ORDER — MAGNESIUM GLUCONATE 500 MG PO TABS
250.0000 mg | ORAL_TABLET | Freq: Every day | ORAL | Status: DC
  Administered 2022-02-03: 250 mg via ORAL
  Filled 2022-02-03: qty 1

## 2022-02-03 NOTE — Progress Notes (Signed)
?                                                       PROGRESS NOTE ? ? ?Subjective/Complaints: ?No new complaints this morning ?Still feels fatiged ?CBGs 99-158 ?Cr 1.22 on 4/7 ? ?ROS:  ?Pt denies SOB, abd pain, CP, N/V/C/D, and vision changes, +fatigue ? ?Objective: ?  ?No results found. ?No results for input(s): WBC, HGB, HCT, PLT in the last 72 hours. ? ? ? ?Recent Labs  ?  02/02/22 ?0647  ?NA 141  ?K 4.4  ?CL 110  ?CO2 24  ?GLUCOSE 110*  ?BUN 22  ?CREATININE 1.22*  ?CALCIUM 9.1  ? ? ? ?Intake/Output Summary (Last 24 hours) at 02/03/2022 1222 ?Last data filed at 02/03/2022 0700 ?Gross per 24 hour  ?Intake 236 ml  ?Output --  ?Net 236 ml  ?  ? ?  ? ?Physical Exam: ?Vital Signs ?Blood pressure (!) 171/64, pulse 70, temperature 98.9 ?F (37.2 ?C), temperature source Oral, resp. rate 18, height 5' (1.524 m), weight 129.7 kg, SpO2 96 %. ?Gen: no distress, normal appearing ?HEENT: oral mucosa pink and moist, NCAT ?Cardio: Reg rate ?Chest: normal effort, normal rate of breathing ?Abd: soft, non-distended ?Ext: no edema ? ?Psych: pleasant and cooperative  ? ?Neurological:  ?   Comments: Patient is alert.  No acute distress.  Makes eye contact with examiner.  Oriented to person and place.  Fair awareness of deficits. RUE remains 0/5, RLE 1-2/5, Left side 5/5. Sensation intact. ? ? ?Assessment/Plan: ?1. Functional deficits which require 3+ hours per day of interdisciplinary therapy in a comprehensive inpatient rehab setting. ?Physiatrist is providing close team supervision and 24 hour management of active medical problems listed below. ?Physiatrist and rehab team continue to assess barriers to discharge/monitor patient progress toward functional and medical goals ? ?Care Tool: ? ?Bathing ?   ?Body parts bathed by patient: Face, Left upper leg, Chest, Right upper leg  ? Body parts bathed by helper: Right arm, Left arm, Front perineal area, Abdomen, Buttocks, Right lower leg, Left lower leg ?  ?  ?Bathing assist Assist Level:  Maximal Assistance - Patient 24 - 49% ?  ?  ?Upper Body Dressing/Undressing ?Upper body dressing   ?What is the patient wearing?: Bra, Pull over shirt ?   ?Upper body assist Assist Level: Maximal Assistance - Patient 25 - 49% ?   ?Lower Body Dressing/Undressing ?Lower body dressing ? ? ?   ?What is the patient wearing?: Pants, Incontinence brief ? ?  ? ?Lower body assist Assist for lower body dressing: Maximal Assistance - Patient 25 - 49% ?   ? ?Toileting ?Toileting    ?Toileting assist Assist for toileting: Maximal Assistance - Patient 25 - 49% ?  ?  ?Transfers ?Chair/bed transfer ? ?Transfers assist ? Chair/bed transfer activity did not occur: Safety/medical concerns ? ?Chair/bed transfer assist level: Total Assistance - Patient < 25% ?  ?  ?Locomotion ?Ambulation ? ? ?Ambulation assist ? ? Ambulation activity did not occur: Safety/medical concerns ? ?  ?  ?   ? ?Walk 10 feet activity ? ? ?Assist ? Walk 10 feet activity did not occur: Safety/medical concerns ? ?  ?   ? ?Walk 50 feet activity ? ? ?Assist Walk 50 feet with 2 turns activity did not occur: Safety/medical concerns ? ?  ?   ? ? ?  Walk 150 feet activity ? ? ?Assist Walk 150 feet activity did not occur: Safety/medical concerns ? ?  ?  ?  ? ?Walk 10 feet on uneven surface  ?activity ? ? ?Assist Walk 10 feet on uneven surfaces activity did not occur: Safety/medical concerns ? ? ?  ?   ? ?Wheelchair ? ? ? ? ?Assist Is the patient using a wheelchair?: Yes ?Type of Wheelchair: Manual ?  ? ?Wheelchair assist level: Dependent - Patient 0% ?Max wheelchair distance: 0  ? ? ?Wheelchair 50 feet with 2 turns activity ? ? ? ?Assist ? ?  ?  ? ? ?Assist Level: Dependent - Patient 0%  ? ?Wheelchair 150 feet activity  ? ? ? ?Assist ?   ? ? ?Assist Level: Dependent - Patient 0%  ? ?Blood pressure (!) 171/64, pulse 70, temperature 98.9 ?F (37.2 ?C), temperature source Oral, resp. rate 18, height 5' (1.524 m), weight 129.7 kg, SpO2 96 %. ? ?Medical Problem List and Plan: ?1.  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            -patient may shower ?            -ELOS/Goals: 4/18 MinA PT, S OT, S SLP,  ?            -HFU scheduled ? -Continue CIR- PT, OT and SLP    ? -may be good candidate for vagal nerve stimulator outpatient.  ?2.  Left lower extremity DVTs: Eliquis started. Discussed cost with patient and wife ?            -antiplatelet therapy: Aspirin 325 mg daily and Plavix 75 mg daily x3 months then aspirin alone ?3. Pain Management: N/A ?4. Mood: Provide emotional support ?            -antipsychotic agents: N/A ?5. Neuropsych: This patient is capable of making decisions on her own behalf. ?6. Skin/Wound Care: Routine skin checks, diarrhea related perianal irritation, order Tucks  ?7. Fluids/Electrolytes/Nutrition:   ? -appears to have good appetite, eating  100% ?8.  Diabetes mellitus.  Hemoglobin A1c 13.1.  Insulin therapy as directed with diabetic teaching. Educated regarding avoiding foods with added sugar.  ? CBG (last 3)  ?Recent Labs  ?  02/02/22 ?2050 02/03/22 ?4098 02/03/22 ?1137  ?GLUCAP 99 109* 158*  ?Has moderate SSI ? 3/29: increase metformin to 500mg  BID and decrease insulin to 35U.  ? 48: continue insulin 35U ? ?9.  Hypertension. Metoprolol 25mg  BID Continue Norvasc 10 mg daily.  Monitor with increased mobility. Add magnesium gluconate 250mg  HS ?Vitals:  ? 02/02/22 1959 02/03/22  ?BP: (!) 144/70 (!) 171/64  ?Pulse: 84 70  ?Resp: 16 18  ?Temp: 98.5 ?F (36.9 ?C) 98.9 ?F (37.2 ?C)  ?SpO2: 96%   ? ?  ?10.  Hyperlipidemia.  Lipitor ?11.  Morbid obesity.  BMI greater than 56. Continue heart healthy/carb modified diet ?12. Vitamin D deficiency: start ergocalciferol 50,000U once per week for 7 weeks ?13. Insomnia: resolved with melatonin 3mg  HS, will change to prn. Placed sign on door  not to wake before 5am.  ?14. AKI: improving, place nursing order to encourage 6-8 glasses of water per day, repeat creatinine tomorrow.  ? 4/3- Last labs BUN of 21 and Cr 0.94- creat up, ,may be related to metform vs  Septra plus hydration enc fluids  ? Cr up to 1.22 on 4/7, repeat Cr on Monday ?15. Rash on posterior legs: hypoallergenic sheets and hydrocortisone cream ordered, resolved, d/c hydrocortisone  ?16. Tachycardia: start lopressor 12.5mg  BID on 3/30. - resolved ?17.  No further abd cramping likely due to  UTI ecoli >100K on Septra DS- but changed to fosfomycin due to resistance has 1 more dose  ? ? ? ?LOS: ?17 days ?A FACE TO FACE EVALUATION WAS PERFORMED ? ?Clint Bolder P Mennie Spiller ?02/03/2022, 12:22 PM  ? ?  ?

## 2022-02-03 NOTE — Progress Notes (Signed)
Pt unable to wear resting hand splint as hand splint is no where to be found. Pt reports that someone did order her a new one. ?

## 2022-02-04 LAB — URINALYSIS, ROUTINE W REFLEX MICROSCOPIC
Bilirubin Urine: NEGATIVE
Glucose, UA: NEGATIVE mg/dL
Ketones, ur: NEGATIVE mg/dL
Nitrite: NEGATIVE
Protein, ur: 100 mg/dL — AB
RBC / HPF: >50 RBC/hpf — ABNORMAL HIGH (ref 0–5)
Specific Gravity, Urine: 1.014 (ref 1.005–1.030)
WBC, UA: >50 WBC/hpf — ABNORMAL HIGH (ref 0–5)
pH: 6 (ref 5.0–8.0)

## 2022-02-04 LAB — GLUCOSE, CAPILLARY
Glucose-Capillary: 150 mg/dL — ABNORMAL HIGH (ref 70–99)
Glucose-Capillary: 170 mg/dL — ABNORMAL HIGH (ref 70–99)
Glucose-Capillary: 90 mg/dL (ref 70–99)
Glucose-Capillary: 116 mg/dL — ABNORMAL HIGH (ref 70–99)

## 2022-02-04 MED ORDER — FOSFOMYCIN TROMETHAMINE 3 G PO PACK: 3.0000 g | PACK | Freq: Once | ORAL | Status: DC

## 2022-02-04 NOTE — Plan of Care (Signed)
?  Problem: Consults ?Goal: RH STROKE PATIENT EDUCATION ?Description: See Patient Education module for education specifics  ?Outcome: Not Progressing ?  ?Problem: RH BOWEL ELIMINATION ?Goal: RH STG MANAGE BOWEL WITH ASSISTANCE ?Description: STG Manage Bowel with mod I  Assistance. ?Outcome: Not Progressing ?Goal: RH STG MANAGE BOWEL W/MEDICATION W/ASSISTANCE ?Description: STG Manage Bowel with Medication with mod I  Assistance. ?Outcome: Not Progressing ?  ?Problem: RH SAFETY ?Goal: RH STG ADHERE TO SAFETY PRECAUTIONS W/ASSISTANCE/DEVICE ?Description: STG Adhere to Safety Precautions With cues Assistance/Device. ?Outcome: Not Progressing ?  ?Problem: RH KNOWLEDGE DEFICIT ?Goal: RH STG INCREASE KNOWLEDGE OF DIABETES ?Description: Patient and family will be able to manage DM with medications and dietary modifications using handouts and educational resources independently ?Outcome: Not Progressing ?Goal: RH STG INCREASE KNOWLEDGE OF HYPERTENSION ?Description: Patient and family will be able to manage HTN with medications and dietary modifications using handouts and educational resources independently ?Outcome: Not Progressing ?Goal: RH STG INCREASE KNOWLEGDE OF HYPERLIPIDEMIA ?Description: Patient and family will be able to manage HLD with medications and dietary modifications using handouts and educational resources independently ?Outcome: Not Progressing ?Goal: RH STG INCREASE KNOWLEDGE OF STROKE PROPHYLAXIS ?Description: Patient and family will be able to manage secondary stroke with medications and dietary modifications using handouts and educational resources independently ?Outcome: Not Progressing ?  ?

## 2022-02-04 NOTE — Progress Notes (Signed)
Pharmacy Antibiotic Note ? ?Samantha [name] is a 66 y.o. female admitted to CIR on 01/17/2022 with cerebral vascular accident.  Pharmacy has been consulted to resume fosfomycin due to abnormal UA.  ? ?Patient grew E. Coli in the urine on 4/1 and was started on Bactrim which E. Coli was resistant to and then switched to fosfomycin. Last CBC was 4/4 where WBC were WNL at 8.8. She has remained afebrile.  ? ?Since her last dose of fosfomycin was 4/8, will wait until 72 hours to repeat dose given she was on a q72h regimen prior to fosfomycin being discontinued.  ? ?Plan: ?-Fosfomycin 3g x1 on 4/11 ?-F/up urine cultures and susceptibilities  ?-Recommended getting a CBC to assess WBC count ? ?Height: 5' (152.4 cm) ?Weight: 129.7 kg (285 lb 15 oz) ?IBW/kg (Calculated) : 45.5 ? ?Temp (24hrs), Avg:98.7 ?F (37.1 ?C), Min:98.5 ?F (36.9 ?C), Max:99.1 ?F (37.3 ?C) ? ?Recent Labs  ?Lab 01/30/22 ?8841 02/02/22 ?6606  ?WBC 8.8  --   ?CREATININE 1.55* 1.22*  ?  ?Estimated Creatinine Clearance: 58.2 mL/min (A) (by C-G formula based on SCr of 1.22 mg/dL (H)).   ? ?Allergies  ?Allergen Reactions  ? Penicillins Rash  ? ? ?Antimicrobials this admission: ?Bactrim 4/2 >> 4/5 ?Fosfomycin 4/5 >> 4/8 ?Fosfomycin restarted 4/9 >>  ? ?Microbiology results: ? ?4/1 UCx: >100K E. coli (Ampicillin, unasyn, ciprofloxacin, and Bactrim resistant) ?4/9 UCx: pending  ? ? ?Thank you for allowing pharmacy to be a part of this patient?s care. ? ?Valeda Malm, Pharm.D. ?PGY-1 Pharmacy Resident ?Phone:[phone] ?02/04/2022 12:29 PM ? ? ?

## 2022-02-04 NOTE — Progress Notes (Addendum)
Physical Therapy Session Note ? ?Patient Details  ?Name: Samantha [name] ?MRN: [id number] ?Date of Birth: 14-Sep-1956 ? ?Today's Date: 02/04/2022 ?PT Individual Time: 1700-1749 ?PT Individual Time Calculation (min): 50 min  ? ?Time missed 10 min. ? ?Short Term Goals: ?Week 2:  PT Short Term Goal 1 (Week 2): Pt complete bed mobility with maxA of 1 person ?PT Short Term Goal 1 - Progress (Week 2): Met ?PT Short Term Goal 2 (Week 2): Pt will complete bed<>chair transfers with maxA of 1 person ?PT Short Term Goal 2 - Progress (Week 2): Met ?PT Short Term Goal 3 (Week 2):  (2 minutes) ?PT Short Term Goal 3 - Progress (Week 2): Not met ?PT Short Term Goal 4 (Week 2): Pt will ambulate 95f with maxA of 1 person ?PT Short Term Goal 4 - Progress (Week 2): Not met ? ?Skilled Therapeutic Interventions/Progress Updates: Pt presents supine in bed and c/o ab pain from UTI.  Per nsg, pt completed abx yesterday, but new orders for UA 2/2 continued c/o abd pain.  Pt encouraged to participate w/ therapy.  Long slow stretches to R hand/fingers and wrist w/ improved extension.  Unable to locate stay for new hand splint cover in room, At conclusion of session, R hand positioned w/ towel roll for improved position.  PT threaded shorts over feet and pt pulled up to hips.  Pt able to flex L knee for partial bridge/roll to pull up over Left hip, PT assist w/ stretching to flex R knee, but required roll to Left w/ max A and total A to complete R side of shorts.  Pt required max A for roll to Right and then to sitting.  Once upright, pt able to maintain midline siting w/ supervision, assist to "square up" body at EOB.  Pt refusing OOB activity except [facility]  Pt performed sit to stand w/ Stedy and elevated bed and max A. [facility] and min A, manual placement of RUE and to maintain on bar.  Pt required max A for sit to supine.  Pt performed min mridge to scoot to middle of bed and or comfort. Long  stretches of R knee extensors to improve hooklying position.  Pt remained in bed w/ bed alarm on and all needs in reach.  Spouse present throughout session. ?   ? ?Therapy Documentation ?Precautions:  ?Precautions ?Precautions: Fall ?Precaution Comments: R hemi (RUE > RLE) ?Restrictions ?Weight Bearing Restrictions: No ?General: ?PT Amount of Missed Time (min): 10 Minutes ?PT Missed Treatment Reason: Pain;Patient unwilling to participate ?Vital Signs: ?Therapy Vitals ?Pulse Rate: 70 ?BP: (!) 151/63 ?Pain:5/10 initially to abd, but increased w/ activity. ?Pain Assessment ?Pain Scale: 0-10 ?Pain Score: 5  ?Pain Type: Acute pain ?Pain Location: Abdomen ?Pain Orientation: Lower;Upper ?Pain Descriptors / Indicators: Cramping ?Pain Frequency: Intermittent ?Pain Onset: On-going ?Patients Stated Pain Goal: 0 ?Pain Intervention(s): Medication (See eMAR) ?Multiple Pain Sites: No ?Mobility: ?  ? ? ? ? ?Therapy/Group: Individual Therapy ? ?[name]?02/04/2022, 9:59 AM  ?

## 2022-02-04 NOTE — Progress Notes (Signed)
?                                                       PROGRESS NOTE ? ? ?Subjective/Complaints: ?Complaining of abdominal cramping that is similar to when she had UTI initially- she completed abx for UTI yesterday. Discussed repeating UA/UC today and she is agreeable. RN notes she was more fatigued yesterday with therapy, discussed with her this could be from infection ? ?ROS:  ?Pt denies SOB,  CP, N/V/C/D, and vision changes, +fatigue, +abdominal cramping ? ?Objective: ?  ?No results found. ?No results for input(s): WBC, HGB, HCT, PLT in the last 72 hours. ? ? ? ?Recent Labs  ?  02/02/22 ?0647  ?NA 141  ?K 4.4  ?CL 110  ?CO2 24  ?GLUCOSE 110*  ?BUN 22  ?CREATININE 1.22*  ?CALCIUM 9.1  ? ? ? ?Intake/Output Summary (Last 24 hours) at 02/04/2022 1041 ?Last data filed at 02/04/2022 [phone] ?Gross per 24 hour  ?Intake 240 ml  ?Output --  ?Net 240 ml  ?  ? ?  ? ?Physical Exam: ?Vital Signs ?Blood pressure (!) 151/63, pulse 70, temperature 98.6 ?F (37 ?C), temperature source Oral, resp. rate 16, height 5' (1.524 m), weight 129.7 kg, SpO2 99 %. ?Gen: no distress, normal appearing ?HEENT: oral mucosa pink and moist, NCAT ?Cardio: Reg rate ?Chest: normal effort, normal rate of breathing ?Abd: soft, non-distended ?Ext: no edema ? ? ?Psych: pleasant and cooperative  ? ?Neurological:  ?   Comments: Patient is alert.  No acute distress.  Makes eye contact with examiner.  Oriented to person and place.  Fair awareness of deficits. RUE remains 0/5, RLE 1-2/5, Left side 5/5. Sensation intact. ? ? ?Assessment/Plan: ?1. Functional deficits which require 3+ hours per day of interdisciplinary therapy in a comprehensive inpatient rehab setting. ?Physiatrist is providing close team supervision and 24 hour management of active medical problems listed below. ?Physiatrist and rehab team continue to assess barriers to discharge/monitor patient progress toward functional and medical goals ? ?Care Tool: ? ?Bathing ?   ?Body parts bathed by patient:  Face, Left upper leg, Chest, Right upper leg  ? Body parts bathed by helper: Right arm, Left arm, Front perineal area, Abdomen, Buttocks, Right lower leg, Left lower leg ?  ?  ?Bathing assist Assist Level: Maximal Assistance - Patient 24 - 49% ?  ?  ?Upper Body Dressing/Undressing ?Upper body dressing   ?What is the patient wearing?: Bra, Pull over shirt ?   ?Upper body assist Assist Level: Maximal Assistance - Patient 25 - 49% ?   ?Lower Body Dressing/Undressing ?Lower body dressing ? ? ?   ?What is the patient wearing?: Pants, Incontinence brief ? ?  ? ?Lower body assist Assist for lower body dressing: Maximal Assistance - Patient 25 - 49% ?   ? ?Toileting ?Toileting    ?Toileting assist Assist for toileting: Maximal Assistance - Patient 25 - 49% ?  ?  ?Transfers ?Chair/bed transfer ? ?Transfers assist ? Chair/bed transfer activity did not occur: Safety/medical concerns ? ?Chair/bed transfer assist level: Total Assistance - Patient < 25% ?  ?  ?Locomotion ?Ambulation ? ? ?Ambulation assist ? ? Ambulation activity did not occur: Safety/medical concerns ? ?  ?  ?   ? ?Walk 10 feet activity ? ? ?Assist ? Walk 10 feet  activity did not occur: Safety/medical concerns ? ?  ?   ? ?Walk 50 feet activity ? ? ?Assist Walk 50 feet with 2 turns activity did not occur: Safety/medical concerns ? ?  ?   ? ? ?Walk 150 feet activity ? ? ?Assist Walk 150 feet activity did not occur: Safety/medical concerns ? ?  ?  ?  ? ?Walk 10 feet on uneven surface  ?activity ? ? ?Assist Walk 10 feet on uneven surfaces activity did not occur: Safety/medical concerns ? ? ?  ?   ? ?Wheelchair ? ? ? ? ?Assist Is the patient using a wheelchair?: Yes ?Type of Wheelchair: Manual ?  ? ?Wheelchair assist level: Dependent - Patient 0% ?Max wheelchair distance: 0  ? ? ?Wheelchair 50 feet with 2 turns activity ? ? ? ?Assist ? ?  ?  ? ? ?Assist Level: Dependent - Patient 0%  ? ?Wheelchair 150 feet activity  ? ? ? ?Assist ?   ? ? ?Assist Level: Dependent -  Patient 0%  ? ?Blood pressure (!) 151/63, pulse 70, temperature 98.6 ?F (37 ?C), temperature source Oral, resp. rate 16, height 5' (1.524 m), weight 129.7 kg, SpO2 99 %. ? ?Medical Problem List and Plan: ?1. 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            -patient may shower ?            -ELOS/Goals: 4/18 MinA PT, S OT, S SLP,  ?            -HFU scheduled ? -Continue CIR- PT, OT and SLP    ? -may be good candidate for vagal nerve stimulator outpatient.  ?2.  Left lower extremity DVTs: Eliquis started. Discussed cost with patient and wife ?            -antiplatelet therapy: Aspirin 325 mg daily and Plavix 75 mg daily x3 months then aspirin alone ?3. Pain Management: N/A ?4. Mood: Provide emotional support ?            -antipsychotic agents: N/A ?5. Neuropsych: This patient is capable of making decisions on her own behalf. ?6. Skin/Wound Care: Routine skin checks, diarrhea related perianal irritation, order Tucks  ?7. Fluids/Electrolytes/Nutrition:   ? -appears to have good appetite, eating  100% ?8.  Diabetes mellitus.  Hemoglobin A1c 13.1.  Insulin therapy as directed with diabetic teaching. Educated regarding avoiding foods with added sugar.  ? CBG (last 3)  ?Recent Labs  ?  02/03/22 ?1624 02/03/22 ?2117 02/04/22 ?[id number]  ?GLUCAP 158* 116* 150*  ?Has moderate SSI ? 3/29: increase metformin to 500mg  BID and decrease insulin to 35U.  ? Continue insulin 35U ? ?9.  Hypertension. Metoprolol 25mg  BID Continue Norvasc 10 mg daily.  Monitor with increased mobility. D/c magnesium in case contributing to abdominal cramping ?Vitals:  ? 02/04/22 0521 02/04/22 0804  ?BP: 133/60 (!) 151/63  ?Pulse: 64 70  ?Resp: 16   ?Temp: 98.6 ?F (37 ?C)   ?SpO2: 99%   ? ?  ?10.  Hyperlipidemia.  Lipitor ?11.  Morbid obesity.  BMI greater than 56.  Continue heart healthy/carb modified diet ?12. Vitamin D deficiency: start ergocalciferol 50,000U once per week for 7 weeks ?13. Insomnia: resolved with melatonin 3mg  HS, will change to prn. Placed sign on door not to wake before  5am.  ?14. AKI: improving, place nursing order to encourage 6-8 glasses of water per day, repeat creatinine tomorrow.  ? 4/3- Last labs BUN of 21 and Cr 0.94- creat up, ,may be related to metform vs Septra plus hydration enc fluids  ? Cr up to 1.22 on 4/7, repeat Cr on Monday ?15. Rash on posterior legs: hypoallergenic sheets and hydrocortisone cream ordered, resolved, d/c hydrocortisone  ?16. Tachycardia: resolved, continue lopressor 12.5mg  BID on 3/30. ?17.  Abdominal cramping returned: UA/UC ordered, pending.  ? ? ? ?LOS: ?18 days ?A FACE TO FACE EVALUATION WAS PERFORMED ? ?Lanore Renderos P Davaris Youtsey ?02/04/2022, 10:41 AM  ? ?  ?

## 2022-02-04 NOTE — Progress Notes (Signed)
Received floor nurse call about abnormal UA.  Placed pharmacy consult to restart fosfomycin, per Dr. Aline August recommendation. ?

## 2022-02-05 DIAGNOSIS — I6300 Cerebral infarction due to thrombosis of unspecified precerebral artery: Secondary | ICD-10-CM

## 2022-02-05 LAB — BASIC METABOLIC PANEL
Anion gap: 7 (ref 5–15)
BUN: 22 mg/dL (ref 8–23)
CO2: 24 mmol/L (ref 22–32)
Calcium: 9.1 mg/dL (ref 8.9–10.3)
Chloride: 109 mmol/L (ref 98–111)
Creatinine, Ser: 1.22 mg/dL — ABNORMAL HIGH (ref 0.44–1.00)
GFR, Estimated: 50 mL/min — ABNORMAL LOW (ref >60)
Glucose, Bld: 168 mg/dL — ABNORMAL HIGH (ref 70–99)
Potassium: 4.2 mmol/L (ref 3.5–5.1)
Sodium: 140 mmol/L (ref 135–145)

## 2022-02-05 LAB — GLUCOSE, CAPILLARY
Glucose-Capillary: 155 mg/dL — ABNORMAL HIGH (ref 70–99)
Glucose-Capillary: 134 mg/dL — ABNORMAL HIGH (ref 70–99)
Glucose-Capillary: 141 mg/dL — ABNORMAL HIGH (ref 70–99)

## 2022-02-05 MED ORDER — FOSFOMYCIN TROMETHAMINE 3 G PO PACK
3.0000 g | PACK | Freq: Once | ORAL | Status: AC
  Administered 2022-02-05: 3 g via ORAL
  Filled 2022-02-05: qty 3

## 2022-02-05 NOTE — Plan of Care (Signed)
?  Problem: Consults ?Goal: RH STROKE PATIENT EDUCATION ?Description: See Patient Education module for education specifics  ?Outcome: Progressing ?  ?Problem: RH BOWEL ELIMINATION ?Goal: RH STG MANAGE BOWEL WITH ASSISTANCE ?Description: STG Manage Bowel with mod I  Assistance. ?Outcome: Progressing ?Goal: RH STG MANAGE BOWEL W/MEDICATION W/ASSISTANCE ?Description: STG Manage Bowel with Medication with mod I  Assistance. ?Outcome: Progressing ?  ?Problem: RH SAFETY ?Goal: RH STG ADHERE TO SAFETY PRECAUTIONS W/ASSISTANCE/DEVICE ?Description: STG Adhere to Safety Precautions With cues Assistance/Device. ?Outcome: Progressing ?  ?Problem: RH KNOWLEDGE DEFICIT ?Goal: RH STG INCREASE KNOWLEDGE OF DIABETES ?Description: Patient and family will be able to manage DM with medications and dietary modifications using handouts and educational resources independently ?Outcome: Progressing ?Goal: RH STG INCREASE KNOWLEDGE OF HYPERTENSION ?Description: Patient and family will be able to manage HTN with medications and dietary modifications using handouts and educational resources independently ?Outcome: Progressing ?Goal: RH STG INCREASE KNOWLEGDE OF HYPERLIPIDEMIA ?Description: Patient and family will be able to manage HLD with medications and dietary modifications using handouts and educational resources independently ?Outcome: Progressing ?Goal: RH STG INCREASE KNOWLEDGE OF STROKE PROPHYLAXIS ?Description: Patient and family will be able to manage secondary stroke with medications and dietary modifications using handouts and educational resources independently ?Outcome: Progressing ?  ?

## 2022-02-05 NOTE — Progress Notes (Signed)
Physical Therapy Session Note ? ?Patient Details  ?Name: Samantha [name] ?MRN: [id number] ?Date of Birth: 24-Jul-1956 ? ?Today's Date: 02/05/2022 ?PT Individual Time: 7793-9030 ?PT Individual Time Calculation (min): 53 min  ? ?Short Term Goals: ?Week 3:  PT Short Term Goal 1 (Week 3): Pt will complete bed mobility with modA ?PT Short Term Goal 2 (Week 3): Pt will complete bed<>chair transfers with modA and LRAD ?PT Short Term Goal 3 (Week 3): Pt will tolerate standing for 3 minutes to prepare for functional mobility ?PT Short Term Goal 4 (Week 3): Pt will ambulate 37ft with maxA and LRAD ? ?Skilled Therapeutic Interventions/Progress Updates:  ?   ? ?Pt sitting in w/c to start and agreeable to PT tx. She denies pain but appeared confused that she had a therapy session. Reviewed with her the therapy scheduled and she had better understanding. Transported in w/c to main rehab gym for time. Focused session on standing with RW and pre-gait training. ? ? With attempts to stand from w/c to RW, her RLE slides forward and she's unable to sustained RLE activation to keep it underneath her durnig transfers. The RW was also too narrow for her but therapist was unable to locate a bari RW in DME closet. Therefore, transferred to mat table with modA squat<>pivot transfer and we used the back of the arm chair on her L side and placed dycem under her R foot to prevent from sliding. Practiced standing with sit<>stand transfers and forward/backwards stepping with her LLE and R knee blocked, minA overall. Also able to progress forward/backward stepping with her paretic RLE but pt becoming highly anxious during this activity deferred further efforts. She also was reporting R leg pain after these tasks but with further questioning, determined this was fatigue more than pain. ? ? Transferred back to w/c via modA squat<>pivot transfer with assist needed for hip clearance and assistance for repositioning - continued to require assist for  stabilizing RLE underneath her as it will side forward during transfers. Returned to her room and pt requesting to lay down in bed. Completed SB transfer with modA towards her weaker R side back to bed, stabilizing RLE and assisting for hip translation over board. She required minA for sit>supine for RLE management and then cues for awareness of body positioning once in bed. She remained semi-reclined with pillows supporting her, bed alarm on, call bell in lap, and all needs within reach.  ? ?Therapy Documentation ?Precautions:  ?Precautions ?Precautions: Fall ?Precaution Comments: R hemi (RUE > RLE) ?Restrictions ?Weight Bearing Restrictions: No ?General: ?  ? ?Therapy/Group: Individual Therapy ? ?[name] [name] ?02/05/2022, 7:34 AM  ?

## 2022-02-05 NOTE — Progress Notes (Signed)
?                                                       PROGRESS NOTE ? ? ?Subjective/Complaints: ? ?No issues overnite , abd cramping subsided , discussed UA result, C and S pnd  ? ?ROS:  ?Pt denies SOB,  CP, N/V/C/D, and vision changes, +fatigue, -abdominal cramping ? ?Objective: ?  ?No results found. ?No results for input(s): WBC, HGB, HCT, PLT in the last 72 hours. ? ? ? ?Recent Labs  ?  02/05/22 ?0712  ?NA 140  ?K 4.2  ?CL 109  ?CO2 24  ?GLUCOSE 168*  ?BUN 22  ?CREATININE 1.22*  ?CALCIUM 9.1  ? ? ? ? ?Intake/Output Summary (Last 24 hours) at 02/05/2022 2500 ?Last data filed at 02/04/2022 1821 ?Gross per 24 hour  ?Intake 234 ml  ?Output --  ?Net 234 ml  ? ?  ? ?  ? ?Physical Exam: ?Vital Signs ?Blood pressure (!) 159/78, pulse 72, temperature 99.5 ?F (37.5 ?C), temperature source Oral, resp. rate 17, height 5' (1.524 m), weight 129.7 kg, SpO2 99 %. ?Gen: no distress, normal appearing ?HEENT: oral mucosa pink and moist, NCAT ?Cardio: Reg rate ?Chest: normal effort, normal rate of breathing ?Abd: soft, non-distended ?Ext: no edema ? ? ?Psych: pleasant and cooperative  ? ?Neurological:  ?   Comments: Patient is alert.  No acute distress.  Makes eye contact with examiner.  Oriented to person and place.  Fair awareness of deficits. RUE remains 0/5, RLE 1-2/5, Left side 5/5. Sensation intact. ?TOne MAS 2 in RIght finger flexors  ? ?Assessment/Plan: ?1. Functional deficits which require 3+ hours per day of interdisciplinary therapy in a comprehensive inpatient rehab setting. ?Physiatrist is providing close team supervision and 24 hour management of active medical problems listed below. ?Physiatrist and rehab team continue to assess barriers to discharge/monitor patient progress toward functional and medical goals ? ?Care Tool: ? ?Bathing ?   ?Body parts bathed by patient: Face, Left upper leg, Chest, Right upper leg  ? Body parts bathed by helper: Right arm, Left arm, Front perineal area, Abdomen, Buttocks, Right lower  leg, Left lower leg ?  ?  ?Bathing assist Assist Level: Maximal Assistance - Patient 24 - 49% ?  ?  ?Upper Body Dressing/Undressing ?Upper body dressing   ?What is the patient wearing?: Bra, Pull over shirt ?   ?Upper body assist Assist Level: Maximal Assistance - Patient 25 - 49% ?   ?Lower Body Dressing/Undressing ?Lower body dressing ? ? ?   ?What is the patient wearing?: Pants, Incontinence brief ? ?  ? ?Lower body assist Assist for lower body dressing: Maximal Assistance - Patient 25 - 49% ?   ? ?Toileting ?Toileting    ?Toileting assist Assist for toileting: Maximal Assistance - Patient 25 - 49% ?  ?  ?Transfers ?Chair/bed transfer ? ?Transfers assist ? Chair/bed transfer activity did not occur: Safety/medical concerns ? ?Chair/bed transfer assist level: Total Assistance - Patient < 25% ?  ?  ?Locomotion ?Ambulation ? ? ?Ambulation assist ? ? Ambulation activity did not occur: Safety/medical concerns ? ?  ?  ?   ? ?Walk 10 feet activity ? ? ?Assist ? Walk 10 feet activity did not occur: Safety/medical concerns ? ?  ?   ? ?Walk 50 feet activity ? ? ?  Assist Walk 50 feet with 2 turns activity did not occur: Safety/medical concerns ? ?  ?   ? ? ?Walk 150 feet activity ? ? ?Assist Walk 150 feet activity did not occur: Safety/medical concerns ? ?  ?  ?  ? ?Walk 10 feet on uneven surface  ?activity ? ? ?Assist Walk 10 feet on uneven surfaces activity did not occur: Safety/medical concerns ? ? ?  ?   ? ?Wheelchair ? ? ? ? ?Assist Is the patient using a wheelchair?: Yes ?Type of Wheelchair: Manual ?  ? ?Wheelchair assist level: Dependent - Patient 0% ?Max wheelchair distance: 0  ? ? ?Wheelchair 50 feet with 2 turns activity ? ? ? ?Assist ? ?  ?  ? ? ?Assist Level: Dependent - Patient 0%  ? ?Wheelchair 150 feet activity  ? ? ? ?Assist ?   ? ? ?Assist Level: Dependent - Patient 0%  ? ?Blood pressure (!) 159/78, pulse 72, temperature 99.5 ?F (37.5 ?C), temperature source Oral, resp. rate 17, height 5' (1.524 m), weight  129.7 kg, SpO2 99 %. ? ?Medical Problem List and Plan: ?1. 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            -patient may shower ?            -ELOS/Goals: 4/18 MinA PT, S OT, S SLP,  ?            -HFU scheduled ? -Continue CIR- PT, OT and SLP    ? -too weak in RUE for vagal nerve stim at this time , also needs to be >4155mo post CVA ?2.  Left lower extremity DVTs: Eliquis started. Discussed cost with patient and wife ?            -antiplatelet therapy: Aspirin 325 mg daily and Plavix 75 mg daily x3 months then aspirin alone ?3. Pain Management: N/A ?4. Mood: Provide emotional support ?            -antipsychotic agents: N/A ?5. Neuropsych: This patient is capable of making decisions on her own behalf. ?6. Skin/Wound Care: Routine skin checks, diarrhea related perianal irritation, order Tucks  ?7. Fluids/Electrolytes/Nutrition:   ? -appears to have good appetite, eating  100% ?8.  Diabetes mellitus.  Hemoglobin A1c 13.1.  Insulin therapy as directed with diabetic teaching. Educated regarding avoiding foods with added sugar.  ? CBG (last 3)  ?Recent Labs  ?  02/04/22 ?1558 02/04/22 ?2108 02/05/22 ?[id number]  ?GLUCAP 90 116* 155*  ? ?COntrolled 4/10 ? 3/29: increase metformin to 500mg  BID and decrease insulin to 35U.  ? Continue insulin 35U ? ?9.  Hypertension. Metoprolol 25mg  BID Continue Norvasc 10 mg daily.  Monitor with increased mobility. D/c magnesium in case contributing to abdominal cramping ?Vitals:  ? 02/04/22 2031 02/05/22 0802  ?BP: (!) 172/71 (!) 159/78  ?Pulse: 73 72  ?Resp: 17   ?Temp: 99.5 ?F (37.5 ?C)   ?SpO2: 99%   ? ?  ?10.  Hyperlipidemia.  Lipitor ?11.  Morbid obesity.  BMI greater than 56. Continue heart healthy/carb modified diet ?12. Vitamin D deficiency: start ergocalciferol 50,000U once per week for 7 weeks ?13.  Insomnia: resolved with melatonin 3mg  HS, will change to prn. Placed sign on door not to wake before 5am.  ?14. AKI: improving, place nursing order to  encourage 6-8 glasses of water per day, repeat creatinine tomorrow.  ? 4/3- Last labs BUN of 21 and Cr 0.94- creat up, ,may be related to metform vs Septra plus hydration enc fluids  ? Cr up to 1.22 on 4/7, repeat Cr on Monday ?15. Rash on posterior legs: hypoallergenic sheets and hydrocortisone cream ordered, resolved, d/c hydrocortisone  ?16. Tachycardia: resolved, continue lopressor 12.5mg  BID on 3/30. ?17.  Abdominal cramping returned: UA+ , UCx pending.  ? ? ? ?LOS: ?19 days ?A FACE TO FACE EVALUATION WAS PERFORMED ? ?Victorino Sparrow Kareema Keitt ?02/05/2022, 8:12 AM  ? ?  ?

## 2022-02-05 NOTE — Progress Notes (Signed)
Speech Language Pathology Daily Session Note ? ?Patient Details  ?Name: Samantha [name] ?MRN: [id number] ?Date of Birth: July 15, 1956 ? ?Today's Date: 02/05/2022 ?SLP Individual Time: 1000-1045 ?SLP Individual Time Calculation (min): 45 min ? ?Short Term Goals: ?Week 3: SLP Short Term Goal 1 (Week 3): Pt will correctly place/organize 3 different medications in pill organizer given Mod verbal and visual A. ?SLP Short Term Goal 2 (Week 3): To improve awareness and self-advocacy, patient will recall 3 pieces of information relevant to IPR admission given Min verbal for use of internal and/or external memory aids. ?SLP Short Term Goal 3 (Week 3): Pt will complete divergent and convergent verbal reasoning task with 80% accuracy given Min A. ? ?Skilled Therapeutic Interventions: ?Pt seen this date for skilled ST intervention targeting cognitive-linguistic goals outlined above. Pt encountered awake, though appeared fatigued and subdued; reclined in bed. No c/o pain. Agreeable to ST intervention at bedside.  ? ?SLP specifically targeted recall, verbal reasoning, alternating attention, and problem-solving via pill organizer task. Pt benefited from overall Max verbal and visual A to complete functional divergent and convergent reasoning task related to pill box selection when given 4 options, stating "this is so confusing." With Max verbal and visual A, pt selected BID pill organizer and placed 2 out of 3 pills correctly given Mod verbal and visual A and Total A for opening pill bottles and boxes on pill organizer. Required Mod verbal and visual A to correct errors, which were identified by SLP on 2 out of 3 medications. At the end of today's session, pt recalled current medical treatment for ongoing UTI with Mod I and activities completed with PT on 4/9 with overall Min verbal A. ? ?Session concluded with pt in bed, bed alarm on, call bell within reach, and all immediate needs met. Continue per current ST POC. ? ?Pain ?Denies pain;  NAD ? ?Therapy/Group: Individual Therapy ? ?Calahan Pak A Ellisyn Icenhower ?02/05/2022, 12:43 PM ?

## 2022-02-05 NOTE — Progress Notes (Signed)
Occupational Therapy Session Note ? ?Patient Details  ?Name: Samantha [name] ?MRN: [id number] ?Date of Birth: Aug 01, 1956 ? ?Today's Date: 02/05/2022 ?OT Individual Time: 1045-1200 ?OT Individual Time Calculation (min): 75 min  ? ? ?Short Term Goals: ?Week 3:  OT Short Term Goal 1 (Week 3): Pt will complete BSC transfer with mod assist squat pivot. ?OT Short Term Goal 2 (Week 3): Pt will complete LB dressing with mod assist at sit<>stand level using RW. ?OT Short Term Goal 3 (Week 3): Pt will tolerate standing for > 60 seconds in prep for LB self care. ? ?Skilled Therapeutic Interventions/Progress Updates:  ?  Pt semi reclined in bed, no c/o pain, requesting to wash up at sink and politely declining shower level bathing.  Pt completed right roll and sidelying to sit using bedrails with min assist.  Pt completed squat pivot to w/c with mod assist.  Pt transported to sink where she washed her face and brushed teeth self initiating hemi techniques with setup only.  Pt doffed shirt with supervision.  Pt bathed UB using LHS with mod assist.  She then donned her bra and shirt with min assist and Vcs for compensatory strategies.  Pt completed sit<>stand at sink multiple times to complete LB bathing and dressing requiring only min assist.  Pt doffed brief with mod assist and bathed LB with min assist overall. Noted brief was soiled of urine upon doffing (brief was dry upon OT arrival).  Pt donned clean brief with total assist in standing and donned pants with max assist at sit<>stand level, needing increased assist due to onset of fatigue.  Pt presenting as well with dyspnea in standing therefore assessed vitals: BP 163/84 pulse 70 bpm SPO2 on RA WNL.  Socks and shoes donned with total assist.  Pt sitting up in w/c, call bell in reach, right arm trough placed to support RUE, seat belt alarm donned.  Pt reports "I did a good job" when asked how she felt that she performed today.   ? ?Therapy Documentation ?Precautions:   ?Precautions ?Precautions: Fall ?Precaution Comments: R hemi (RUE > RLE) ?Restrictions ?Weight Bearing Restrictions: No ? ? ? ?Therapy/Group: Individual Therapy ? ?Samantha [name] ?02/05/2022, 1:27 PM ?

## 2022-02-05 NOTE — Consult Note (Signed)
Neuropsychological Consultation ? ? ?Patient:   Samantha [name]  ? ?DOB:   October 06, 1956 ? ?MR Number:  [id number] ? ?Location:  [facility] ?[facility] [address] [facility] ?[address] ?[id number] [facility] ?[place] [address] ?Dept: [phone] ?Loc: [phone] ?          ?Date of Service:   02/05/2022 ? ?Start Time:   9 AM ?End Time:   10 AM ? ?Provider/Observer:  Arley Phenix, Psy.D.   ?    Clinical Neuropsychologist ?     ? ?Billing Code/Service: [id number] ? 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  Presented on 01/13/2022 with right-sided weakness of acute onset.  MRI of brain on 3/19 showed acute white matter infarct of the left corona radiata and also consistent with recent CT also indications of lacunar infarcts in the left thalamus that may have been subacute.  Patient with persistent right-sided weakness with admission to CIR due to functional deficits. ? ?Reason for Service:  Patient referred for neuropsychological consultation due to past history of anxiety and depressive symptoms and current coping issues with recent right-sided motor deficits.  Below is the HPI for the current admission. ? 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  Per chart review lives with spouse.  1 level home 3 steps to entry.  Independent with driving and ADLs and did not require assistive device.  Presented 01/13/2022 with right side weakness of acute onset.  Cranial CT scan showed new white matter changes on the left primarily involving the corona radiata abutting the body of the left caudate.  There was also a lacunar infarct in  the left thalamus not identified on previous study.  CT angiogram head and neck occlusion versus severe stenosis of proximal right M2 MCA branch with distal reconstitution.  Severe proximal left M2 MCA stenosis.  MRI of the brain 01/14/2022 confluent acute white matter infarct of the left corona radiata.  No associated hemorrhage or mass effect.  Superimposed punctate acute on chronic right cerebellar SCA territory ischemia.  Admission chemistry sodium 134 glucose 494 BUN 25 creatinine 1.26, troponin 25, hemoglobin A1c 13.1.  Echocardiogram with ejection fraction of 55 to 60% no wall motion abnormalities.  Currently maintained on aspirin 325 mg daily and Plavix 75 mg daily for CVA prophylaxis x3 months then aspirin alone given intracranial severe stenosis.  Lovenox for DVT prophylaxis.  Tolerating a regular diet.  Therapy evaluations completed due to patient's right side weakness was admitted for a comprehensive rehab program. Currently complains of weakness.  ? ?Current Status:  Patient awake but somewhat drowsy as I entered the room laying in her bed in a slightly elevated position.  Patient was able to describe her current status at a basic level and was both aware of her previous history of cerebellar infarction as well as most recent CVA.  Patient noted significant motor deficits with her right arm and right leg.  Patient also has had recent infection with antibiotic change for better coverage.  Patient reports that her pain that she associated with her infection had improved with antibiotic change.  Patient acknowledged anxiousness around her potential long-term motor deficits and depression  in a setting with prior history of depressive events. ? ?Behavioral Observation: Samantha [name]  presents as a 66 y.o.-year-old Right handed African American Female who appeared her stated age. her dress was Appropriate and she was Well Groomed and her manners were Appropriate to the situation.  her participation was  indicative of Appropriate and Redirectable behaviors.  There were physical disabilities noted.  she displayed an appropriate level of cooperation and motivation.   ? ? ?Interactions:    Active Drowsy and Redirectable ? ?Attention:   abnormal and attention span appeared shorter than expected for age ? ?Memory:   within normal limits; recent and remote memory intact ? ?Visuo-spatial:  not examined ? ?Speech (Volume):  low ? ?Speech:   normal; normal ? ?Thought Process:  Coherent and Relevant ? ?Though Content:  WNL; not suicidal and not homicidal ? ?Orientation:   person, place, time/date, and situation ? ?Judgment:   Fair ? ?Planning:   Poor ? ?Affect:    Depressed and Lethargic ? ?Mood:    Dysphoric ? ?Insight:   Fair ? ?Intelligence:   normal ? ?Medical History:  History reviewed. No pertinent past medical history. ? ?     ?Patient Active Problem List  ? Diagnosis Date Noted  ? CVA (cerebral vascular accident) (HCC) 01/13/2022  ? AKI (acute kidney injury) (HCC) 01/13/2022  ? Hyponatremia 01/13/2022  ? Elevated troponin 01/13/2022  ? Cerebrovascular accident (CVA) (HCC) 12/15/2020  ? Noncompliance with diabetes treatment 12/15/2020  ? Hyperlipidemia associated with type 2 diabetes mellitus (HCC) 08/11/2020  ? Type 2 diabetes mellitus without complication, without long-term current use of insulin (HCC) 10/10/2018  ? Hypertension 10/10/2018  ? Morbid obesity (HCC) 10/10/2018  ? ?Psychiatric History:  Patient acknowledges issues with anxiety and depression in particular coping issues currently. ? ?Family Med/Psych History:  ?Family History  ?Problem Relation Age of Onset  ? Hypertension Mother   ? ? 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  Presented on 01/13/2022 with right-sided weakness of acute onset.  MRI of brain on 3/19 showed acute white matter  infarct of the left corona radiata and also consistent with recent CT also indications of lacunar infarcts in the left thalamus that may have been subacute.  Patient with persistent right-sided weakness with admission to CIR due to functional deficits. ? ?Patient awake but somewhat drowsy as I entered the room laying in her bed in a slightly elevated position.  Patient was able to describe her current status at a basic level and was both aware of her previous history of cerebellar infarction as well as most recent CVA.  Patient noted significant motor deficits with her right arm and right leg.  Patient also has had recent infection with antibiotic change for better coverage.  Patient reports that her pain that she associated with her infection had improved with antibiotic change.  Patient acknowledged anxiousness around her potential long-term motor deficits and depression in a setting with prior history of depressive events. ? ?Disposition/Plan:  Worked on coping and adjustment issues with the patient who acknowledges anxiety and depressive symptomatology with worry about her capacity for functional independence.  She reports she is motivated for functional gains with therapy but quite apprehensive and anxious at times. ? ?Diagnosis:    Cerebrovascular accident (CVA) due to thrombosis of precerebral artery (HCC) - Plan: Ambulatory referral to Neurology ? ?      ? ?  Electronically Signed ? ? ?_______________________ ?Arley PhenixJohn Lollie Gunner, Psy.D. ?Clinical Neuropsychologist ? ? ?      ? ?

## 2022-02-06 LAB — URINE CULTURE: Culture: >=100000 — AB

## 2022-02-06 LAB — GLUCOSE, CAPILLARY
Glucose-Capillary: 177 mg/dL — ABNORMAL HIGH (ref 70–99)
Glucose-Capillary: 211 mg/dL — ABNORMAL HIGH (ref 70–99)

## 2022-02-06 NOTE — Progress Notes (Signed)
Physical Therapy Session Note ? ?Patient Details  ?Name: Samantha [name] ?MRN: [id number] ?Date of Birth: Jun 23, 1956 ? ?Today's Date: 02/06/2022 ?PT Individual Time: 9476-5465 ?PT Individual Time Calculation (min): 71 min  ? ?Short Term Goals: ?Week 3:  PT Short Term Goal 1 (Week 3): Pt will complete bed mobility with modA ?PT Short Term Goal 2 (Week 3): Pt will complete bed<>chair transfers with modA and LRAD ?PT Short Term Goal 3 (Week 3): Pt will tolerate standing for 3 minutes to prepare for functional mobility ?PT Short Term Goal 4 (Week 3): Pt will ambulate 25ft with maxA and LRAD ? ?Skilled Therapeutic Interventions/Progress Updates:  ?   ?Direct handoff of care from OT to start session. Pt agreeable without c/o pain. Transported in w/c to main rehab gym for time. Retrieved bariatric RW from [facility] DME closet and applied RW splint for RUE. Completed stand<>pivot transfer with +2 min/modA using RW - assist needed for managing RW and for assist with RLE placement during transfer. We practiced sit<>Stand transfers from mat table to RW with +2 minA and required assist for placement of RLE to avoid it from being to narrow or sliding forwards.  ? ?Attempted to fit AFO but her shoes don't fit an AFO and recommended to her than family look into better fitting shoes such a [employer] or tennis shoes that are wide and have higher ankle support.  ? ?Gait training using RW with +2 min/modA - ambulating 68ft + 71ft + 59ft + 86ft(!!!). She required assist for steering and stabilizing RW during gait. She was able to initiate RLE swing and had adequate quad activation in terminal stance to prevent buckling. She did have some compensation with adductors for pulling through swing phase with her foot slightly externally rotated. She required verbal cueing for avoiding RLE adduction and narrow BOS but this was difficult for her to sustain.  ? ?Pt returned to room and assisted back to bed via stand<>pivot transfer using RW and +2 modA  (increased assist due to fatigue). Required minA for sit>supine using hospital bed features for RLE management and then assist for repositioning in bed due to decreased awareness of body positioning. Bed alarm on, call bell in lap, all needs in reach.  ? ?Therapy Documentation ?Precautions:  ?Precautions ?Precautions: Fall ?Precaution Comments: R hemi (RUE > RLE) ?Restrictions ?Weight Bearing Restrictions: No ?General: ?  ? ?Therapy/Group: Individual Therapy ? ?Orrin Brigham PT ?02/06/2022, 7:29 AM  ?

## 2022-02-06 NOTE — Progress Notes (Signed)
?                                                       PROGRESS NOTE ? ? ?Subjective/Complaints: ?Denies any further abdominal crampine ?Has no new complaints.  ? ?ROS:  ?Pt denies SOB,  CP, N/V/C/D, and vision changes, +fatigue, abdominal cramping resolved ? ?Objective: ?  ?No results found. ?No results for input(s): WBC, HGB, HCT, PLT in the last 72 hours. ? ? ? ?Recent Labs  ?  02/05/22 ?0712  ?NA 140  ?K 4.2  ?CL 109  ?CO2 24  ?GLUCOSE 168*  ?BUN 22  ?CREATININE 1.22*  ?CALCIUM 9.1  ? ? ? ?Intake/Output Summary (Last 24 hours) at 02/06/2022 1002 ?Last data filed at 02/06/2022 0700 ?Gross per 24 hour  ?Intake 120 ml  ?Output --  ?Net 120 ml  ?  ? ?  ? ?Physical Exam: ?Vital Signs ?Blood pressure (!) 141/70, pulse 72, temperature 98.9 ?F (37.2 ?C), temperature source Oral, resp. rate 20, height 5' (1.524 m), weight 129.7 kg, SpO2 99 %. ?Gen: no distress, normal appearing ?HEENT: oral mucosa pink and moist, NCAT ?Cardio: Reg rate ?Chest: normal effort, normal rate of breathing ?Abd: soft, non-distended ?Ext: no edema ? ? ?Psych: pleasant and cooperative  ? ?Neurological:  ?   Comments: Patient is alert.  No acute distress.  Makes eye contact with examiner.  Oriented to person and place.  Fair awareness of deficits. RUE remains 0/5, RLE 1-2/5, Left side 5/5. Sensation intact. ?TOne MAS 2 in RIght finger flexors  ? ?Assessment/Plan: ?1. Functional deficits which require 3+ hours per day of interdisciplinary therapy in a comprehensive inpatient rehab setting. ?Physiatrist is providing close team supervision and 24 hour management of active medical problems listed below. ?Physiatrist and rehab team continue to assess barriers to discharge/monitor patient progress toward functional and medical goals ? ?Care Tool: ? ?Bathing ?   ?Body parts bathed by patient: Face, Left upper leg, Chest, Right upper leg  ? Body parts bathed by helper: Right arm, Left arm, Front perineal area, Abdomen, Buttocks, Right lower leg, Left  lower leg ?  ?  ?Bathing assist Assist Level: Maximal Assistance - Patient 24 - 49% ?  ?  ?Upper Body Dressing/Undressing ?Upper body dressing   ?What is the patient wearing?: Bra, Pull over shirt ?   ?Upper body assist Assist Level: Maximal Assistance - Patient 25 - 49% ?   ?Lower Body Dressing/Undressing ?Lower body dressing ? ? ?   ?What is the patient wearing?: Pants, Incontinence brief ? ?  ? ?Lower body assist Assist for lower body dressing: Maximal Assistance - Patient 25 - 49% ?   ? ?Toileting ?Toileting    ?Toileting assist Assist for toileting: Maximal Assistance - Patient 25 - 49% ?  ?  ?Transfers ?Chair/bed transfer ? ?Transfers assist ? Chair/bed transfer activity did not occur: Safety/medical concerns ? ?Chair/bed transfer assist level: Total Assistance - Patient < 25% ?  ?  ?Locomotion ?Ambulation ? ? ?Ambulation assist ? ? Ambulation activity did not occur: Safety/medical concerns ? ?  ?  ?   ? ?Walk 10 feet activity ? ? ?Assist ? Walk 10 feet activity did not occur: Safety/medical concerns ? ?  ?   ? ?Walk 50 feet activity ? ? ?Assist Walk 50 feet with 2 turns  activity did not occur: Safety/medical concerns ? ?  ?   ? ? ?Walk 150 feet activity ? ? ?Assist Walk 150 feet activity did not occur: Safety/medical concerns ? ?  ?  ?  ? ?Walk 10 feet on uneven surface  ?activity ? ? ?Assist Walk 10 feet on uneven surfaces activity did not occur: Safety/medical concerns ? ? ?  ?   ? ?Wheelchair ? ? ? ? ?Assist Is the patient using a wheelchair?: Yes ?Type of Wheelchair: Manual ?  ? ?Wheelchair assist level: Dependent - Patient 0% ?Max wheelchair distance: 0  ? ? ?Wheelchair 50 feet with 2 turns activity ? ? ? ?Assist ? ?  ?  ? ? ?Assist Level: Dependent - Patient 0%  ? ?Wheelchair 150 feet activity  ? ? ? ?Assist ?   ? ? ?Assist Level: Dependent - Patient 0%  ? ?Blood pressure (!) 141/70, pulse 72, temperature 98.9 ?F (37.2 ?C), temperature source Oral, resp. rate 20, height 5' (1.524 m), weight 129.7 kg,  SpO2 99 %. ? ?Medical Problem List and Plan: ?1. 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            -patient may shower ?            -ELOS/Goals: 4/18 MinA PT, S OT, S SLP,  ?            -HFU scheduled ? -Continue CIR- PT, OT and SLP    ? -discussed vagal nerve stimulator as an option in 6 months if still with weakness ?2.  Left lower extremity DVTs: Continue Eliquis until 04/30/21.  Discussed cost with patient and wife ?            -antiplatelet therapy: Aspirin 325 mg daily and Plavix 75 mg daily x3 months then aspirin alone ?3. Pain Management: N/A ?4. Mood: Provide emotional support ?            -antipsychotic agents: N/A ?5. Neuropsych: This patient is capable of making decisions on her own behalf. ?6. Diarrhea related perianal irritation, order Tucks  ?7. Fluids/Electrolytes/Nutrition:   ? -appears to have good appetite, eating  100% ?8.  Diabetes mellitus.  Hemoglobin A1c 13.1.  Insulin therapy as directed with diabetic teaching. Educated regarding avoiding foods with added sugar.  ? CBG (last 3)  ?Recent Labs  ?  02/05/22 ?0611 02/05/22 ?1225 02/05/22 ?1724  ?GLUCAP 155* 134* 141*  ? ? 3/29: increase metformin to 500mg  BID and decrease insulin to 35U.  ? Continue insulin 35U ? ?9.  Hypertension. Metoprolol 25mg  BID Continue Norvasc 10 mg daily.  Monitor with increased mobility. D/c magnesium in case contributing to abdominal cramping ?Vitals:  ? 02/05/22 2200 02/06/22 0500  ?BP: 140/62 (!) 141/70  ?Pulse: 74 72  ?Resp: 18 20  ?Temp: 98.7 ?F (37.1 ?C) 98.9 ?F (37.2 ?C)  ?SpO2: 100% 99%  ? ?  ?10.  Hyperlipidemia.  Lipitor ?11.  Morbid obesity.  BMI greater than 56. Continue heart healthy/carb modified diet ?12. Vitamin D deficiency: start ergocalciferol 50,000U once per week for 7 weeks ?13. Insomnia: resolved with  melatonin 3mg  HS, will change to prn. Placed sign on door not to wake before 5am.  ?14. AKI: improving, place nursing order to encourage 6-8 glasses of water per day, repeat creatinine tomorrow.  ? 4/3- Last  labs BUN of 21 and Cr 0.94- creat up, ,may be related to metform vs Septra plus hydration enc fluids  ? Cr up to 1.22 on 4/7, repeat Cr on Monday ?15. Rash on posterior legs: hypoallergenic sheets and hydrocortisone cream ordered, resolved, d/c hydrocortisone  ?16. Tachycardia: resolved, continue lopressor 12.5mg  BID on 3/30. ?17.  Abdominal cramping: UA+ , UC with >100,000 E coli, resolved  ? ? ? ?LOS: ?20 days ?A FACE TO FACE EVALUATION WAS PERFORMED ? ?Clint Bolder P Deziree Mokry ?02/06/2022, 10:02 AM  ? ?  ?

## 2022-02-06 NOTE — Progress Notes (Signed)
Speech Language Pathology Daily Session Note ? ?Patient Details  ?Name: Samantha [name] ?MRN: [id number] ?Date of Birth: Mar 18, 1956 ? ?Today's Date: 02/06/2022 ?SLP Individual Time: 1005-1050 ?SLP Individual Time Calculation (min): 45 min ? ?Short Term Goals: ?Week 3: SLP Short Term Goal 1 (Week 3): Pt will correctly place/organize 3 different medications in pill organizer given Mod verbal and visual A. ?SLP Short Term Goal 2 (Week 3): To improve awareness and self-advocacy, patient will recall 3 pieces of information relevant to IPR admission given Min verbal for use of internal and/or external memory aids. ?SLP Short Term Goal 3 (Week 3): Pt will complete divergent and convergent verbal reasoning task with 80% accuracy given Min A. ? ?Skilled Therapeutic Interventions: ?Pt seen, this AM, for skilled ST intervention targeting cognitive-linguistic goals outlined above. Pt encountered awake/alert and OOB in w/c with safety belt donned. Remains subdued with short responses to questions. Verbalized excited when discussing upcoming d/c. Agreeable to ST intervention at bedside. ? ?SLP facilitated today's session providing compensatory attention/executive function strategy training for use of pill organizer this date. Following education, pt utilized strategies with Max verbal and visual/written A to correctly place/organize 3 out of 3 different medications in pill box with cues faded to Mod-Max as task progressed. Completed basic + functional divergent and convergent verbal reasoning task r/t medication management + safety with Min-Mod verbal A. Utilized external aids (e.g. calendar and medication list) to recall date and medications given Set-Up/Supervision A. Required extended processing time across functional tasks. Recommend full supervision and assistance with medication management upon discharge. Will plan to continue to addressing medication management, verbal reasoning r/tADL's and iADL's, and recall with use of  external aids. Will also benefit from further education re: CVA. ? ?Session concluded with pt in bed, bed alarm on, call bell within reach, and all immediate needs met. Continue per current ST POC.  ? ?Pain ?No/Denies pain; NAD ? ?Therapy/Group: Individual Therapy ? ?Becket Wecker A Zavien Clubb ?02/06/2022, 11:36 AM ?

## 2022-02-06 NOTE — Progress Notes (Signed)
Occupational Therapy Session Note ? ?Patient Details  ?Name: Samantha [name] ?MRN: [id number] ?Date of Birth: 11/12/1955 ? ?Today's Date: 02/06/2022 ?OT Individual Time: 0973-5329 ?OT Individual Time Calculation (min): 27 min  ? ? ?Short Term Goals: ? ?Week 3:  OT Short Term Goal 1 (Week 3): Pt will complete BSC transfer with mod assist squat pivot. ?OT Short Term Goal 2 (Week 3): Pt will complete LB dressing with mod assist at sit<>stand level using RW. ?OT Short Term Goal 3 (Week 3): Pt will tolerate standing for > 60 seconds in prep for LB self care. ? ?Skilled Therapeutic Interventions/Progress Updates:  ?  Pt semi reclined in bed, needing some encouragement to participate.  Pt agreeable to oral hygiene at sinkside.  Right roll and sidelying to sit with min assist and multimodal cues for hand placement and body mechanics.  Donned shoes with total assist.  Pt completed squat pivot EOB to w/c ( pt coming to mid upright standing position during squat pivot) with min assist.  Transported to sink where pt brushed teeth with setup and hair placed in pony tail with total assist.  Direct hand off to PT. ? ?Therapy Documentation ?Precautions:  ?Precautions ?Precautions: Fall ?Precaution Comments: R hemi (RUE > RLE) ?Restrictions ?Weight Bearing Restrictions: No ? ? ? ?Therapy/Group: Individual Therapy ? ?[name] Jamarea Selner ?02/06/2022, 2:07 PM ?

## 2022-02-07 LAB — GLUCOSE, CAPILLARY
Glucose-Capillary: 140 mg/dL — ABNORMAL HIGH (ref 70–99)
Glucose-Capillary: 132 mg/dL — ABNORMAL HIGH (ref 70–99)
Glucose-Capillary: 128 mg/dL — ABNORMAL HIGH (ref 70–99)
Glucose-Capillary: 177 mg/dL — ABNORMAL HIGH (ref 70–99)

## 2022-02-07 LAB — BASIC METABOLIC PANEL
Anion gap: 8 (ref 5–15)
BUN: 32 mg/dL — ABNORMAL HIGH (ref 8–23)
CO2: 21 mmol/L — ABNORMAL LOW (ref 22–32)
Calcium: 9 mg/dL (ref 8.9–10.3)
Chloride: 109 mmol/L (ref 98–111)
Creatinine, Ser: 1.44 mg/dL — ABNORMAL HIGH (ref 0.44–1.00)
GFR, Estimated: 41 mL/min — ABNORMAL LOW (ref >60)
Glucose, Bld: 186 mg/dL — ABNORMAL HIGH (ref 70–99)
Potassium: 4.8 mmol/L (ref 3.5–5.1)
Sodium: 138 mmol/L (ref 135–145)

## 2022-02-07 MED ORDER — BACLOFEN 5 MG HALF TABLET
5.0000 mg | ORAL_TABLET | Freq: Every day | ORAL | Status: DC
  Administered 2022-02-07 – 2022-02-12 (×6): 5 mg via ORAL
  Filled 2022-02-07 (×6): qty 1

## 2022-02-07 MED ORDER — MODAFINIL 100 MG PO TABS
100.0000 mg | ORAL_TABLET | Freq: Every day | ORAL | Status: DC
  Administered 2022-02-08 – 2022-02-12 (×5): 100 mg via ORAL
  Filled 2022-02-07 (×5): qty 1

## 2022-02-07 NOTE — Progress Notes (Signed)
When nurse attempted to change patient this am she refused stating she had already been changed but neither staff member had changed her. She stated I could leave the room after I asked her again to be sure if she need to checked. She then wanted to know what the noise was in the hallway and was told it was the trays being delivered. I brought in her tray and was told to leave her tray she will eat later, she once again denied needing attention.. After I left the room I asked the other staff member on the unit if he had attended to her earlier and he stated no. ?

## 2022-02-07 NOTE — Progress Notes (Signed)
?                                                       PROGRESS NOTE ? ? ?Subjective/Complaints: ?Doing alright this morning ?Team conference today ?No more abdominal pain ?Lack of motivation a more recent issue with therapy this week and last week ? ?ROS:  ?Pt denies SOB,  CP, N/V/C/D, and vision changes, +fatigue, abdominal cramping resolved, +decreased motivation as per therapy ? ?Objective: ?  ?No results found. ?No results for input(s): WBC, HGB, HCT, PLT in the last 72 hours. ? ? ? ?Recent Labs  ?  02/05/22 ?0712  ?NA 140  ?K 4.2  ?CL 109  ?CO2 24  ?GLUCOSE 168*  ?BUN 22  ?CREATININE 1.22*  ?CALCIUM 9.1  ? ? ? ?Intake/Output Summary (Last 24 hours) at 02/07/2022 1258 ?Last data filed at 02/07/2022 6720 ?Gross per 24 hour  ?Intake 360 ml  ?Output --  ?Net 360 ml  ?  ? ?  ? ?Physical Exam: ?Vital Signs ?Blood pressure (!) 144/80, pulse 74, temperature 98.7 ?F (37.1 ?C), temperature source Oral, resp. rate 18, height 5' (1.524 m), weight 129.7 kg, SpO2 98 %. ?Gen: no distress, normal appearing ?HEENT: oral mucosa pink and moist, NCAT ?Cardio: Reg rate ?Chest: normal effort, normal rate of breathing ?Abd: soft, non-distended ?Ext: no edema ? ? ?Psych: pleasant and cooperative  ? ?Neurological:  ?   Comments: Patient is alert.  No acute distress.  Makes eye contact with examiner.  Oriented to person and place.  Fair awareness of deficits. RUE remains 0/5, RLE 1-2/5, Left side 5/5. Sensation intact. ?TOne MAS 2 in RIght finger flexors  ? ?Assessment/Plan: ?1. Functional deficits which require 3+ hours per day of interdisciplinary therapy in a comprehensive inpatient rehab setting. ?Physiatrist is providing close team supervision and 24 hour management of active medical problems listed below. ?Physiatrist and rehab team continue to assess barriers to discharge/monitor patient progress toward functional and medical goals ? ?Care Tool: ? ?Bathing ?   ?Body parts bathed by patient: Face, Left upper leg, Chest, Right  upper leg  ? Body parts bathed by helper: Right arm, Left arm, Front perineal area, Abdomen, Buttocks, Right lower leg, Left lower leg ?  ?  ?Bathing assist Assist Level: Maximal Assistance - Patient 24 - 49% ?  ?  ?Upper Body Dressing/Undressing ?Upper body dressing   ?What is the patient wearing?: Bra, Pull over shirt ?   ?Upper body assist Assist Level: Maximal Assistance - Patient 25 - 49% ?   ?Lower Body Dressing/Undressing ?Lower body dressing ? ? ?   ?What is the patient wearing?: Pants, Incontinence brief ? ?  ? ?Lower body assist Assist for lower body dressing: Maximal Assistance - Patient 25 - 49% ?   ? ?Toileting ?Toileting    ?Toileting assist Assist for toileting: Maximal Assistance - Patient 25 - 49% ?  ?  ?Transfers ?Chair/bed transfer ? ?Transfers assist ? Chair/bed transfer activity did not occur: Safety/medical concerns ? ?Chair/bed transfer assist level: Total Assistance - Patient < 25% ?  ?  ?Locomotion ?Ambulation ? ? ?Ambulation assist ? ? Ambulation activity did not occur: Safety/medical concerns ? ?  ?  ?   ? ?Walk 10 feet activity ? ? ?Assist ? Walk 10 feet activity did not occur: Safety/medical concerns ? ?  ?   ? ?  Walk 50 feet activity ? ? ?Assist Walk 50 feet with 2 turns activity did not occur: Safety/medical concerns ? ?  ?   ? ? ?Walk 150 feet activity ? ? ?Assist Walk 150 feet activity did not occur: Safety/medical concerns ? ?  ?  ?  ? ?Walk 10 feet on uneven surface  ?activity ? ? ?Assist Walk 10 feet on uneven surfaces activity did not occur: Safety/medical concerns ? ? ?  ?   ? ?Wheelchair ? ? ? ? ?Assist Is the patient using a wheelchair?: Yes ?Type of Wheelchair: Manual ?  ? ?Wheelchair assist level: Dependent - Patient 0% ?Max wheelchair distance: 0  ? ? ?Wheelchair 50 feet with 2 turns activity ? ? ? ?Assist ? ?  ?  ? ? ?Assist Level: Dependent - Patient 0%  ? ?Wheelchair 150 feet activity  ? ? ? ?Assist ?   ? ? ?Assist Level: Dependent - Patient 0%  ? ?Blood pressure (!)  144/80, pulse 74, temperature 98.7 ?F (37.1 ?C), temperature source Oral, resp. rate 18, height 5' (1.524 m), weight 129.7 kg, SpO2 98 %. ? ?Medical Problem List and Plan: ?1. 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            -patient may shower ?            -ELOS/Goals: 4/18 MinA PT, S OT, S SLP,  ?            -HFU scheduled ? -Continue CIR- PT, OT and SLP    ? -discussed vagal nerve stimulator as an option in 6 months if still with weakness ? -Interdisciplinary Team Conference today   ?2.  Left lower extremity DVTs: Continue Eliquis until 04/30/21.  Discussed cost with patient and wife ?            -antiplatelet therapy: Aspirin 325 mg daily and Plavix 75 mg daily x3 months then aspirin alone ?3. Pain Management: N/A ?4. Mood: Provide emotional support ?            -antipsychotic agents: N/A ?5. Neuropsych: This patient is capable of making decisions on her own behalf. ?6. Diarrhea related perianal irritation, order Tucks  ?7. Fluids/Electrolytes/Nutrition:   ? -appears to have good appetite, eating  100% ?8.  Diabetes mellitus.  Hemoglobin A1c 13.1.  Insulin therapy as directed with diabetic teaching. Educated regarding avoiding foods with added sugar.  ? CBG (last 3)  ?Recent Labs  ?  02/06/22 ?1702 02/07/22 ?0811 02/07/22 ?1135  ?GLUCAP 211* 140* 132*  ? ? 3/29: increase metformin to 500mg  BID and decrease insulin to 35U.  ? Continue insulin 35U ? ?9.  Hypertension. Metoprolol 25mg  BID Continue Norvasc 10 mg daily.  Monitor with increased mobility. D/c magnesium in case contributing to abdominal cramping ?Vitals:  ? 02/06/22 2055 02/07/22 0600  ?BP: (!) 150/70 (!) 144/80  ?Pulse: 70 74  ?Resp: 14 18  ?Temp: 98.8 ?F (37.1 ?C) 98.7 ?F (37.1 ?C)  ?SpO2: 97% 98%  ? ?  ?10.  Hyperlipidemia.  LDL reviewed and is 115. Continue  Lipitor ?11.  Morbid obesity.  BMI greater than 56. Continue heart healthy/carb modified diet ?12. Vitamin D deficiency: start ergocalciferol 50,000U once per week for 7 weeks ?13. Insomnia: resolved with melatonin 3mg  HS, will change to prn. Placed sign on door  not to wake before 5am.  ?14. AKI: nursing order to encourage 6-8 glasses of water per day, check creatinine today ?15. Rash on posterior legs: hypoallergenic sheets and hydrocortisone cream ordered, resolved, d/c hydrocortisone  ?16. Tachycardia: resolved, continue lopressor 12.5mg  BID  ?17.  Abdominal cramping: UA+ , UC with >100,000 E coli, resolved  ? ? ? ?LOS: ?21 days ?A FACE TO FACE EVALUATION WAS PERFORMED ? ?Clint BolderKrutika P Ariyannah Pauling ?02/07/2022, 12:58 PM  ? ?  ?

## 2022-02-07 NOTE — Progress Notes (Signed)
Patient ID: Samantha [name], female   DOB: [birth year], 66 y.o.   MRN: [id number]  Met with pt to discuss team conference and discharge date still 4/18. Discussed the need for family members to come in for hands on training prior to her going home. She was agreeable for worker to call husband to set up time for education. Spoke with [name]-husband who reports he works until 2:00 each day. Discussed he had told worker he was retired he reports: " I am but still work." Discussed his wife will require 24/7 physical care and who will be caring for her while he is working. He reports other family members asked for he and them to come in for education prior to pt's discharge. He wants to come in at 3:00 will schedule this each day so can see all three therapies. Do not feel he understands the care pt will require at discharge. He will be here tomorrow at 3:00 will see him then and try to get him to have other family members come in for training prior to discharge next Tuesday. Asked pt to talk with family also regarding this. ?

## 2022-02-07 NOTE — Progress Notes (Signed)
Speech Language Pathology Daily Session Note ? ?Patient Details  ?Name: Samantha [name] ?MRN: [id number] ?Date of Birth: 1956/10/04 ? ?Today's Date: 02/07/2022 ?SLP Individual Time: 2774-1287 ?SLP Individual Time Calculation (min): 45 min ? ?Short Term Goals: ?Week 3: SLP Short Term Goal 1 (Week 3): Pt will correctly place/organize 3 different medications in pill organizer given Mod verbal and visual A. ?SLP Short Term Goal 2 (Week 3): To improve awareness and self-advocacy, patient will recall 3 pieces of information relevant to IPR admission given Min verbal for use of internal and/or external memory aids. ?SLP Short Term Goal 3 (Week 3): Pt will complete divergent and convergent verbal reasoning task with 80% accuracy given Min A. ? ?Skilled Therapeutic Interventions: ?Pt seen, this AM, for skilled ST intervention targeting cognitive-linguistic goals outlined above. Pt encountered awake/alert and lying semi-reclined in bed with R arm off pillow. Max A for readjusting R arm onto pillow for support. Pt continues with decreased awareness of R side body. Pt pleasant and cooperative, though appears to exhibit difficulty with auditory processing and appears more quiet in comparison to initial evaluation; MD aware. Agreeable to ST intervention at bedside. Oriented to date with Mod I. ? ?SLP facilitated today's session by providing Set-Up/Supervision to [place] A for use of external memory aids to include medication list and compensatory attention/executive function strategy list. Pt recalled 3 out of 3 medications placed in pill organizer on 4/11, with Mod I, given medication list; 0 out of 3 without list present. With Set-Up A pt correctly placed/organized 4 out of 4 medications given Min-Mod verbal A for double checking to ensure accuracy and placing completed medication in a separate area. Immediately following pill organizer task, pt verbalized 2 out of 4 medications that she organized with Mod I. Sequencing remains  diminished, with pt's thought processes appearing disorganized and inflexible at times. Continues to benefit from Min to [employer] verbal A for convergent verbal reasoning and Max A for divergent reasoning. Pt provided with verbal encouragement and progression made with targeted task following mass practice and cueing. Pt verbalized that she was pleased with her performance.  ? ?Session concluded with pt in bed, bed alarm on, call bell reviewed and within reach, and all immediate needs met. Continues per current ST POC. ? ?Pain ?Pain Assessment ?Pain Scale: 0-10 ?Pain Score: 0-No pain ? ?Therapy/Group: Individual Therapy ? ?Johanna Stafford A Dajae Kizer ?02/07/2022, 10:36 AM ?

## 2022-02-07 NOTE — Progress Notes (Signed)
Physical Therapy Session Note ? ?Patient Details  ?Name: Samantha [name] ?MRN: [id number] ?Date of Birth: [birth year] ? ?Today's Date: 02/07/2022 ?PT Individual Time: [id number] + [id number] ?PT Individual Time Calculation (min): 28 min  + 60 min ? ?Short Term Goals: ?Week 3:  PT Short Term Goal 1 (Week 3): Pt will complete bed mobility with modA ?PT Short Term Goal 2 (Week 3): Pt will complete bed<>chair transfers with modA and LRAD ?PT Short Term Goal 3 (Week 3): Pt will tolerate standing for 3 minutes to prepare for functional mobility ?PT Short Term Goal 4 (Week 3): Pt will ambulate 61ft with maxA and LRAD ? ?Skilled Therapeutic Interventions/Progress Updates:  ?   ?1st session: ?Pt seated in w/c to start - agreeable but reluctant to participate; flat affect throughout session with limited engagement. Transported in w/c to ortho rehab gym for time management. Worked on stand<>pivot transfers from w/c to mat table using RW - she required modA for transfers and max instructional cueing for technique - she struggled with motor planning and when she fatigued, had poor safety awareness during transfer. Will continue to work on functional transfers with LRAD - SB vs squat pivot transfers. We also worked on repeated sit<>stands to [place] from mat table - progressing from [place] to minA with assist for RLE placement and cueing needed to push from mat table rather than pulling from RW. Pt ended session seated in w/c, safety belt alarm on, call bell in reach.  ? ?2nd session: ?Pt supine in bed to start. Agreeable to PT tx and denies pain. Her husband, Eddie Dibbles, entered session and was present throughout session for active observation and hands on training. Dicussed DME rec's (hospital bed, w/c) and family deferring hospital bed - ed on incontinence, bed rails, and HOB elevation to assist with functional mobiltiy but family and patient continued to defer. Donned tennis shoes with totalA for time and pt completed supine<>sitting EOB with  minA and HOB elevated, assist for trunk support. Completed stand<>pivot transfer with modA from EOB to w/c, assist for lateral weight shifting and R foot placement during transfer.  ? ?Transported to ADL apartment and practiced bed<>chair transfers to regular flat bed. Similar technique as above for transfer and pt requiring minA for bed mobility - body habitus and R sided weakness limiting efficiency with mobility. We also practiced car transfers with car height simulating their SUV - pt requiring modA for stand<>pivot to car and requiring assist for BLE management and repositioning in the car as well. Husband had an opportunity to practice stand<>pivot transfers from w/c to mat table and from w/c to bed with PT providing CGA/minA for safety. Husband demonstrates appropriate technique for transfers with PT cueing and overall did well but would benefit from further practice. Pt in bed at end of session, bed alarm on, all needs in reach. She missed 15 minutes of therapy due to fatigue.  ? ? ?Therapy Documentation ?Precautions:  ?Precautions ?Precautions: Fall ?Precaution Comments: R hemi (RUE > RLE) ?Restrictions ?Weight Bearing Restrictions: No ?General: ?  ? ?Therapy/Group: Individual Therapy ? ?Samantha [name] Samantha [name] ?02/07/2022, 7:27 AM  ?

## 2022-02-07 NOTE — Progress Notes (Signed)
Occupational Therapy Session Note ? ?Patient Details  ?Name: Nahlia Munar ?MRN: [id number] ?Date of Birth: [birth year] ? ?Today's Date: 02/07/2022 ?OT Individual Time: 1015-1100; [id number] ?OT Individual Time Calculation (min): 45 min ; 15 min ? ? ?Short Term Goals: ?Week 3:  OT Short Term Goal 1 (Week 3): Pt will complete BSC transfer with mod assist squat pivot. ?OT Short Term Goal 2 (Week 3): Pt will complete LB dressing with mod assist at sit<>stand level using RW. ?OT Short Term Goal 3 (Week 3): Pt will tolerate standing for > 60 seconds in prep for LB self care. ? ?Skilled Therapeutic Interventions/Progress Updates:  ?  First session:  Pt semi reclined in bed, notably soiled of urine.  Pt reports she did not call nursing and that "I knew someone would be coming soon".  Pt agreeable to washing up at sink and politely declining shower level bathing despite encouragement provided by therapist.  Pt completed right roll and sidelying to sit with mod assist using bed features.  Pt completed stand pivot with min assist to w/c with therapist standing anteriorly.  Pt transported to sink where she brushed her teeth and doffed her shirt with setup.  Pt washed UB with min assist using long handled sponge.  Donned shirt with min assist as well.  Pt doffed soiled brief in standing with mod assist and bathed with min assist for balance.  Pt donned brief and pants with mod assist sit<>stand level. Sitting in w/c, right arm trough placed, call bell and needs in reach, seat belt alarm on. ? ?Second session:  Pt sitting up in w/c, actively doffing seat belt alarm over head and stating she was going to try to get back in bed by herself.  Provided question cues to facilitate increased emergent and anticipatory awareness.  Pt agreeable to therapist providing safe assist for transfer.  Attempted sit to stand power up x 2 however pt unable to achieve therefore provided stedy transfer sit<>stand with pt needing min assist for power up  then mod assist stand to sit from stedy to EOB.  Sit to supine with mod assist.  Pt bridged toward [facility] with trendelenburg position and bed rail use as well as therapist manually supporting RLE with hip and knee flexed and foot planted. Call bell in reach, bed alarm on, needs in reach.  Pt reports at end of session, that she sees now she could not have gotten into bed on her own safely and that she is glad therapist came to assist.    ? ?Therapy Documentation ?Precautions:  ?Precautions ?Precautions: Fall ?Precaution Comments: R hemi (RUE > RLE) ?Restrictions ?Weight Bearing Restrictions: No ? ? ? ?Therapy/Group: Individual Therapy ? ?Caryl Asp Sarahi Borland ?02/07/2022, 12:49 PM ?

## 2022-02-08 LAB — GLUCOSE, CAPILLARY
Glucose-Capillary: 114 mg/dL — ABNORMAL HIGH (ref 70–99)
Glucose-Capillary: 180 mg/dL — ABNORMAL HIGH (ref 70–99)
Glucose-Capillary: 141 mg/dL — ABNORMAL HIGH (ref 70–99)
Glucose-Capillary: 163 mg/dL — ABNORMAL HIGH (ref 70–99)

## 2022-02-08 MED ORDER — SODIUM CHLORIDE 0.9 % IV SOLN: INTRAVENOUS | Status: AC

## 2022-02-08 NOTE — Progress Notes (Signed)
Speech Language Pathology Weekly Progress ? ?Patient Details  ?Name: Samantha [name] ?MRN: [id number] ?Date of Birth: 11/16/1955 ? ?Beginning of progress report period: February 01, 2022 ?End of progress report period: February 08, 2022 ?  ?Short Term Goals: ?Week 3: SLP Short Term Goal 1 (Week 3): Pt will correctly place/organize 3 different medications in pill organizer given Mod verbal and visual A. ?SLP Short Term Goal 1 - Progress (Week 3): Met ?SLP Short Term Goal 2 (Week 3): To improve awareness and self-advocacy, patient will recall 3 pieces of information relevant to IPR admission given Min verbal for use of internal and/or external memory aids. ?SLP Short Term Goal 2 - Progress (Week 3): Met ?SLP Short Term Goal 3 (Week 3): Pt will complete divergent and convergent verbal reasoning task with 80% accuracy given Min A. ?SLP Short Term Goal 3 - Progress (Week 3): Not met ? ?  ?New Short Term Goals: ?Week 4: SLP Short Term Goal 1 (Week 4): STG's = LTG's due to ELOS ? ?Weekly Progress Updates: Pt has made subtle functional gains in recall and completion of pill organizer with Min A; meeting 2 out of 3 short-term goals this past week. Continues to require Mod to Max A for novel divergent and convergent problem-solving tasks. Barriers to progress continue to include mood and motivation, diminished initiation, decreased mental flexibility, severity of impairments, and decreased emergent + error awareness. Pt education remains ongoing with caregiver education pending. Will continue to follow-up with pt per updated POC. ? ? ?Intensity: Minumum of 1-2 x/day, 30 to 90 minutes ?Frequency: 3 to 5 out of 7 days ?Duration/Length of Stay: 4/18 - ELOS ?Treatment/Interventions: Cognitive remediation/compensation;Internal/external aids;Cueing hierarchy;Medication managment;Therapeutic Activities;Functional tasks;Patient/family education ? ? ?Bethany A Lutes ?02/08/2022, 7:45 PM ? ? ? ? ? ? ?

## 2022-02-08 NOTE — Progress Notes (Signed)
Physical Therapy Session Note ? ?Patient Details  ?Name: Samantha [name] ?MRN: [id number] ?Date of Birth: 11-Jan-1956 ? ?Today's Date: 02/08/2022 ?PT Individual Time: 0233-4356 ?PT Individual Time Calculation (min): 73 min  ? ?Short Term Goals: ?Week 3:  PT Short Term Goal 1 (Week 3): Pt will complete bed mobility with modA ?PT Short Term Goal 2 (Week 3): Pt will complete bed<>chair transfers with modA and LRAD ?PT Short Term Goal 3 (Week 3): Pt will tolerate standing for 3 minutes to prepare for functional mobility ?PT Short Term Goal 4 (Week 3): Pt will ambulate 72f with maxA and LRAD ? ?Skilled Therapeutic Interventions/Progress Updates:  ?   ?Pt supine in bed watching TV, agreeable to PT tx. Denies pain. Flat affect throughout session with limited effort and engagement during session.  ? ?Supine<>sitting EOB requiring minA for trunk support despite cues for sequencing and technique, with HOB elevated ~35deg. At edge of bed, donned socks and shoes with totalA for time. Completed squat<>pivot transfer with modA from EOB to w/c, towards her weaker L side - cues for safety, setup, and technique. Pt with pelvic obliquity in sitting, favoring her L side - required assist for repositioning to achieve neutral pelvis.  ? ?Transported in w/c to main rehab gym and assisted to mat table in similar squat<>pivot transfer. At edge of mat, we worked on trunk control with modified sit-ups to assist with carryover into functional bed mobility - pt reliant on UE support/assist for modified sit ups - unable to complete without UE support. Addressed obliques with lateral elbow leans to L and R directions. Instructed in repeated sit<>stands, 2x5 with rest breaks b/w sets - completed with CGA and R knee blocked but transition was very effortful and heightened patient's anxiety.  ? ?Pt requesting to return to room to use bathroom. Transported in w/c to room and used Stedy for urgency. [facility] and pt required assist for  LB dressing in standing - pt continent of B & B once on toilet but had already prior incontinence in brief. She required assist for posterior pericare in standing in the [place] for donning new brief. At this point of session, pt refusing further therapy and requesting to return to bed - attempted to encourage her to complete session but she declined. Stedy transfer back to bed and required minA for BLE management and assist for repositioning in bed. Bed alarm on, call bell in lap, all needs met.  ? ?Therapy Documentation ?Precautions:  ?Precautions ?Precautions: Fall ?Precaution Comments: R hemi (RUE > RLE) ?Restrictions ?Weight Bearing Restrictions: No ?General: ?  ? ?Therapy/Group: Individual Therapy ? ?Leeana Creer P Yazmyne Sara PT ?02/08/2022, 7:23 AM  ?

## 2022-02-08 NOTE — Progress Notes (Signed)
?                                                       PROGRESS NOTE ? ? ?Subjective/Complaints: ?No new complaints this morning ?She is sitting up in chair ?Discussed starting modafinil for her and she is agreeable. Discussed mechanism of action with her ? ?ROS:  ?Pt denies SOB,  CP, N/V/C/D, and vision changes, +fatigue, abdominal cramping resolved, +decreased motivation as per therapy ? ?Objective: ?  ?No results found. ?No results for input(s): WBC, HGB, HCT, PLT in the last 72 hours. ? ? ? ?Recent Labs  ?  02/07/22 ?1348  ?NA 138  ?K 4.8  ?CL 109  ?CO2 21*  ?GLUCOSE 186*  ?BUN 32*  ?CREATININE 1.44*  ?CALCIUM 9.0  ? ? ? ?Intake/Output Summary (Last 24 hours) at 02/08/2022 1133 ?Last data filed at 02/08/2022 0836 ?Gross per 24 hour  ?Intake 460 ml  ?Output --  ?Net 460 ml  ?  ? ?  ? ?Physical Exam: ?Vital Signs ?Blood pressure (!) 143/71, pulse 65, temperature 98.4 ?F (36.9 ?C), resp. rate 18, height 5' (1.524 m), weight 125.4 kg, SpO2 96 %. ?Gen: no distress, normal appearing ?HEENT: oral mucosa pink and moist, NCAT ?Cardio: Reg rate ?Chest: normal effort, normal rate of breathing ?Abd: soft, non-distended ?Ext: no edema ? ?Musculoskeletal:  ? ?Psych: pleasant and cooperative  ? ?Neurological:  ?   Comments: Patient is alert.  No acute distress.  Makes eye contact with examiner.  Oriented to person and place.  Fair awareness of deficits. RUE remains 0/5, RLE 1-2/5, Left side 5/5. Sensation intact. ?TOne MAS 2 in RIght finger flexors  ? ?Assessment/Plan: ?1. Functional deficits which require 3+ hours per day of interdisciplinary therapy in a comprehensive inpatient rehab setting. ?Physiatrist is providing close team supervision and 24 hour management of active medical problems listed below. ?Physiatrist and rehab team continue to assess barriers to discharge/monitor patient progress toward functional and medical goals ? ?Care Tool: ? ?Bathing ?   ?Body parts bathed by patient: Face, Left upper leg, Chest, Right  upper leg  ? Body parts bathed by helper: Right arm, Left arm, Front perineal area, Abdomen, Buttocks, Right lower leg, Left lower leg ?  ?  ?Bathing assist Assist Level: Maximal Assistance - Patient 24 - 49% ?  ?  ?Upper Body Dressing/Undressing ?Upper body dressing   ?What is the patient wearing?: Bra, Pull over shirt ?   ?Upper body assist Assist Level: Maximal Assistance - Patient 25 - 49% ?   ?Lower Body Dressing/Undressing ?Lower body dressing ? ? ?   ?What is the patient wearing?: Pants, Incontinence brief ? ?  ? ?Lower body assist Assist for lower body dressing: Maximal Assistance - Patient 25 - 49% ?   ? ?Toileting ?Toileting    ?Toileting assist Assist for toileting: Maximal Assistance - Patient 25 - 49% ?  ?  ?Transfers ?Chair/bed transfer ? ?Transfers assist ? Chair/bed transfer activity did not occur: Safety/medical concerns ? ?Chair/bed transfer assist level: Moderate Assistance - Patient 50 - 74% ?  ?  ?Locomotion ?Ambulation ? ? ?Ambulation assist ? ? Ambulation activity did not occur: Safety/medical concerns ? ?  ?  ?   ? ?Walk 10 feet activity ? ? ?Assist ? Walk 10 feet activity did not occur: Safety/medical  concerns ? ?  ?   ? ?Walk 50 feet activity ? ? ?Assist Walk 50 feet with 2 turns activity did not occur: Safety/medical concerns ? ?  ?   ? ? ?Walk 150 feet activity ? ? ?Assist Walk 150 feet activity did not occur: Safety/medical concerns ? ?  ?  ?  ? ?Walk 10 feet on uneven surface  ?activity ? ? ?Assist Walk 10 feet on uneven surfaces activity did not occur: Safety/medical concerns ? ? ?  ?   ? ?Wheelchair ? ? ? ? ?Assist Is the patient using a wheelchair?: Yes ?Type of Wheelchair: Manual ?  ? ?Wheelchair assist level: Dependent - Patient 0% ?Max wheelchair distance: 0  ? ? ?Wheelchair 50 feet with 2 turns activity ? ? ? ?Assist ? ?  ?  ? ? ?Assist Level: Dependent - Patient 0%  ? ?Wheelchair 150 feet activity  ? ? ? ?Assist ?   ? ? ?Assist Level: Dependent - Patient 0%  ? ?Blood pressure  (!) 143/71, pulse 65, temperature 98.4 ?F (36.9 ?C), resp. rate 18, height 5' (1.524 m), weight 125.4 kg, SpO2 96 %. ? ?Medical Problem List and Plan: ?1. 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            -patient may shower ?            -ELOS/Goals: 4/18 MinA PT, S OT, S SLP,  ?            -HFU scheduled ? -Continue CIR- PT, OT and SLP    ? -discussed vagal nerve stimulator as an option in 6 months if still with weakness ? -Interdisciplinary Team Conference today   ?2.  Left lower extremity DVTs: Continue Eliquis until 04/30/21.  Discussed cost with patient and wife ?            -antiplatelet therapy: Aspirin 325 mg daily and Plavix 75 mg daily x3 months then aspirin alone ?3. Pain Management: N/A ?4. Mood: Provide emotional support ?            -antipsychotic agents: N/A ?5. Neuropsych: This patient is capable of making decisions on her own behalf. ?6. Diarrhea related perianal irritation, order Tucks  ?7. Fluids/Electrolytes/Nutrition:   ? -appears to have good appetite, eating  100% ?8.  Diabetes mellitus.  Hemoglobin A1c 13.1.  Insulin therapy as directed with diabetic teaching. Educated regarding avoiding foods with added sugar.  ? CBG (last 3)  ?Recent Labs  ?  02/07/22 ?1653 02/07/22 ?2038 02/08/22 ?4259  ?GLUCAP 128* 177* 114*  ? ?D/c metformin given AKI ? Continue insulin 35U ? ?9.  Hypertension. Metoprolol 25mg  BID Continue Norvasc 10 mg daily.  Monitor with increased mobility. D/c magnesium in case contributing to abdominal cramping ?Vitals:  ? 02/07/22 1924 02/08/22 0556  ?BP: (!) 122/54 (!) 143/71  ?Pulse: 71 65  ?Resp: 15 18  ?Temp: 98.3 ?F (36.8 ?C) 98.4 ?F (36.9 ?C)  ?SpO2: 94% 96%  ? ?  ?10.  Hyperlipidemia.  LDL reviewed and is 115. Continue Lipitor ?11.  Morbid obesity.  BMI greater than 56. Continue heart  healthy/carb modified diet ?12. Vitamin D deficiency: start ergocalciferol 50,000U once per week for 7 weeks ?13. Insomnia: resolved with melatonin 3mg  HS, will change to prn. Placed sign on door not to wake before  5am.  ?14. AKI: nursing order to encourage 6-8 glasses of water per day, start IVF HS for 10 hours, repeat BMP tomorrow.  ?15. Rash on posterior legs: hypoallergenic sheets and hydrocortisone cream ordered, resolved, d/c hydrocortisone  ?16. Tachycardia: resolved, continue lopressor 12.5mg  BID  ?17.  Abdominal cramping: UA+ , UC with >100,000 E coli, resolved  ?18. Daytime fatigue: started modafinil 100mg  daily ? ? ? ?LOS: ?22 days ?A FACE TO FACE EVALUATION WAS PERFORMED ? ? P Haydon Dorris ?02/08/2022, 11:33 AM  ? ?  ?

## 2022-02-08 NOTE — Progress Notes (Signed)
Physical Therapy Session Note ? ?Patient Details  ?Name: Samantha [name] ?MRN: [id number] ?Date of Birth: 17-Mar-1956 ? ?Today's Date: 02/08/2022 ?PT Individual Time: 0901-1000 ?PT Individual Time Calculation (min): 59 min  ? ?Short Term Goals: ?Week 1:  PT Short Term Goal 1 (Week 1): Pt will complete bed mobility with maxA of 1 person ?PT Short Term Goal 1 - Progress (Week 1): Not met ?PT Short Term Goal 2 (Week 1): Pt will complete bed<>chair transfer with maxA and LRAD ?PT Short Term Goal 2 - Progress (Week 1): Not met ?PT Short Term Goal 3 (Week 1): Pt will tolerate sitting in w/c >2 hours outside of therapy sessions ?PT Short Term Goal 3 - Progress (Week 1): Met ?PT Short Term Goal 4 (Week 1): pt will ambulate 61f with maxA and LRAD ?PT Short Term Goal 4 - Progress (Week 1): Not met ? ?Skilled Therapeutic Interventions/Progress Updates: Pt presents semi-reclined in bed and agreeable to therapy.  Pt sits self in long sitting and initiates doffing sock.  PT threads pant legs over feet and then pt able to pull up to hips on left and then w/ manual assist to maintain hooklying, pt able to pull up further, but unable to complete.  Pt transferred sup to sit w/ min A and elevated HOB.  Pt scooted to EOB and then required mod A to doff overhead shirt and to don clean.  Pt required max to mod A for sit to stand from lowered bed height, but then mod to min from elevated height.  Pt required mod A to complete donning pants.  Pt performed sit to stand w/ mod A and the SPT w/ RW and mod A, verbal cues for posture and weight shift to advance RLE.  Pt wheeled to small gym for energy conservation.  Pt performed squat pivot w/c> mat table to left w/ mod A and verbal cues for safety as pt tends to throw self across to sit.  Pt performed multiple sit to stand transfers w/ mod to min A but extended rest breaks required.  Pt performed seated reaching and stacking/unstacking cones outside of BOS and reaching cross midline to right.  Pt  returned to room in w/c and remained in w/c w/ chair alarm on and all needs in reach. ?   ? ?Therapy Documentation ?Precautions:  ?Precautions ?Precautions: Fall ?Precaution Comments: R hemi (RUE > RLE) ?Restrictions ?Weight Bearing Restrictions: No ?General: ?  ?Vital Signs: ? ?Pain:5/10 ?Pain Assessment ?Pain Scale: 0-10 ?Pain Score: 0-No pain ?Mobility: ?  ? ? ? ? ?Therapy/Group: Individual Therapy ? ?[name]?02/08/2022, 10:58 AM  ?

## 2022-02-08 NOTE — Patient Care Conference (Signed)
Inpatient RehabilitationTeam Conference and Plan of Care Update ?Date: 02/07/2022   Time: 11:10 AM  ? ? ?Patient Name: Samantha [name]      ?Medical Record Number: [id number]  ?Date of Birth: 21-Jan-1956 ?Sex: Female         ?Room/Bed: 4M12C/4M12C-01 ?Payor Info: Payor: AETNA / Plan: [employer]ET[employer] [facility]  / Product Type: *No Product type* /   ? ?Admit Date/Time:  01/17/2022  5:11 PM ? ?Primary Diagnosis:  CVA (cerebral vascular accident) (HCC) ? ?Hospital Problems: Principal Problem: ?  CVA (cerebral vascular accident) (HCC) ? ? ? ?Expected Discharge Date: Expected Discharge Date: 02/13/22 ? ?Team Members Present: ?Physician leading conference: Dr. Sula Soda ?Social Worker Present: Dossie Der, LCSW ?Nurse Present: Chana Bode, RN ?PT Present: Wynelle Link, PT ?OT Present: Dolphus Jenny, OT ?SLP Present: Other (comment) Sarita Bottom, SLP) ?PPS Coordinator present : Fae Pippin, SLP ? ?   Current Status/Progress Goal Weekly Team Focus  ?Bowel/Bladder ? ? Incontinent of both B/B LBM 4/9         ?Swallow/Nutrition/ Hydration ? ?           ?ADL's ? ? min assist UB bathing and dressing; mod assist toileting and LB dressing, easily fatigued  min-mod assist  functional transfer training, hemi strategies, self care training, balance, FMC, pt edu, dc planning   ?Mobility ? ? modA bed mobility, mod I sitting balance, modA sit<>stand to RW, modA stand pivot using RW, gait 30ft with +2 modA using RW.  minA (will downgrade, anticipate wheelchair level)  family education, DC planning, gait training, transfer training   ?Communication ? ?           ?Safety/Cognition/ Behavioral Observations ? Min to [employer] A  Problem-solving for complex daily situations - Min A - will downgrade to basic; Memory - Supervision  Recall of precautions, strategies, problem-solving with functional tasks, sequencing   ?Pain ? ? Voices no c/o pain         ?Skin ? ? skin intact         ? ? ?Discharge Planning:  ?Will need to begin family educaiton  due to amount of care pt will require at DC. Slow progress in therapies. Neuro-psych saw Monday. Limited insurance coverage for [facility] and DME   ?Team Discussion: ?Patient will need insulin at discharge; reinforce education with husband. Patient has had gradual decline in participation, increased apathy, low frustration tolerance and resistance to compensatory strategies and neuro re-education. MD to trial ritalin as little change with lexparo. Also incontinent of bowel and bladder despite toileting and no acknowledgement of incontinence. ? ?Patient on target to meet rehab goals: ?Currently, patient needs min assist for bathing and dressing. Once up and out of bed, able to ambulate 25' x 4 with max encouragement.  ? ?*See Care Plan and progress notes for long and short-term goals.  ? ?Revisions to Treatment Plan:  ?Downgraded goals ?  ?Teaching Needs: ?Safety, medication management, dietary modification, transfers, toileting, etc.  ?Current Barriers to Discharge: ?Decreased caregiver support ? ?Possible Resolutions to Barriers: ?Family education ?FBP:ZWCHENID bed, BA-DABSC, Shower bench ?HH follow up services ?  ? ? Medical Summary ?Current Status: DVT, depression, UTI, fatigue ? Barriers to Discharge: Medical stability ? Barriers to Discharge Comments: DVT, depression, UTI, fatigue, ?Possible Resolutions to [employer]: continue Eliquis, completed fosfomycin, continue escitalopram, discussed vagal nerve stimualor outpatient ? ? ?Continued Need for Acute Rehabilitation Level of Care: The patient requires daily medical management by a physician with specialized training  in physical medicine and rehabilitation for the following reasons: ?Direction of a multidisciplinary physical rehabilitation program to maximize functional independence : Yes ?Medical management of patient stability for increased activity during participation in an intensive rehabilitation regime.: Yes ?Analysis of laboratory values and/or  radiology reports with any subsequent need for medication adjustment and/or medical intervention. : Yes ? ? ?I attest that I was present, lead the team conference, and concur with the assessment and plan of the team. ? ? ?Chana Bode B ?02/08/2022, 10:51 AM  ? ? ? ? ? ? ?

## 2022-02-09 LAB — BASIC METABOLIC PANEL
Anion gap: 8 (ref 5–15)
BUN: 33 mg/dL — ABNORMAL HIGH (ref 8–23)
CO2: 22 mmol/L (ref 22–32)
Calcium: 8.6 mg/dL — ABNORMAL LOW (ref 8.9–10.3)
Chloride: 106 mmol/L (ref 98–111)
Creatinine, Ser: 1.26 mg/dL — ABNORMAL HIGH (ref 0.44–1.00)
GFR, Estimated: 48 mL/min — ABNORMAL LOW (ref >60)
Glucose, Bld: 139 mg/dL — ABNORMAL HIGH (ref 70–99)
Potassium: 4.3 mmol/L (ref 3.5–5.1)
Sodium: 136 mmol/L (ref 135–145)

## 2022-02-09 LAB — GLUCOSE, CAPILLARY
Glucose-Capillary: 130 mg/dL — ABNORMAL HIGH (ref 70–99)
Glucose-Capillary: 144 mg/dL — ABNORMAL HIGH (ref 70–99)
Glucose-Capillary: 70 mg/dL (ref 70–99)
Glucose-Capillary: 121 mg/dL — ABNORMAL HIGH (ref 70–99)

## 2022-02-09 MED ORDER — ONDANSETRON HCL 4 MG PO TABS
4.0000 mg | ORAL_TABLET | Freq: Three times a day (TID) | ORAL | Status: DC | PRN
  Administered 2022-02-09: 4 mg via ORAL
  Filled 2022-02-09 (×4): qty 1

## 2022-02-09 MED ORDER — ASPIRIN 81 MG PO CHEW
324.0000 mg | CHEWABLE_TABLET | Freq: Every day | ORAL | Status: DC
  Administered 2022-02-10 – 2022-02-11 (×2): 324 mg via ORAL
  Filled 2022-02-09 (×2): qty 4

## 2022-02-09 MED ORDER — SODIUM CHLORIDE 0.9 % IV SOLN: INTRAVENOUS | Status: AC

## 2022-02-09 MED ORDER — GENTAMICIN SULFATE 40 MG/ML IJ SOLN
350.0000 mg | Freq: Once | INTRAVENOUS | Status: AC
  Administered 2022-02-09: 350 mg via INTRAVENOUS
  Filled 2022-02-09: qty 8.75

## 2022-02-09 MED ORDER — ESCITALOPRAM OXALATE 10 MG PO TABS
10.0000 mg | ORAL_TABLET | Freq: Every day | ORAL | Status: DC
Start: 2022-02-10 — End: 2022-02-13
  Administered 2022-02-10 – 2022-02-13 (×4): 10 mg via ORAL
  Filled 2022-02-09 (×4): qty 1

## 2022-02-09 NOTE — Progress Notes (Signed)
Occupational Therapy Weekly Progress Note ? ?Patient Details  ?Name: Samantha [name] ?MRN: [id number] ?Date of Birth: 12/08/1955 ? ?Beginning of progress report period: February 02, 2022 ?End of progress report period: February 09, 2022 ? ?Today's Date: 02/09/2022 ?OT Individual Time: [phone] ?OT Individual Time Calculation (min): 20 min ?OT Missed Time: 55 Minutes ?Missed Time Reason: Patient unwilling/refused to participate without medical reason ? ? ?Patient has met 3 of 3 short term goals.  Pt making slow gains towards goals, but has refused skilled OT for the past 2 days and appearing more apathetic currently towards participating.  Prior to this though, pt did show improvement during self care and achieved mod assist at sit<>stand level during LB dressing and min assist UB bathing and dressing with use of long handled sponge.  Pt also was showing improvements in functional squat pivot transfers with min-mod assist.  OT is collaborating with interdisciplinary team in attempts to identify reason for pts recent lack of participation to facilitate maximized gains and independence and safety during ADLs.  Pts husband also would benefit from skilled caregiver education and training prior to discharge date. ? ?Patient continues to demonstrate the following deficits: muscle weakness, muscle joint tightness, and muscle paralysis, decreased cardiorespiratoy endurance, abnormal tone, unbalanced muscle activation, motor apraxia, decreased coordination, and decreased motor planning, right side neglect, decreased initiation, decreased attention, decreased awareness, decreased problem solving, decreased safety awareness, decreased memory, and delayed processing, and decreased sitting balance, decreased standing balance, decreased postural control, hemiplegia, and decreased balance strategies and therefore will continue to benefit from skilled OT intervention to enhance overall performance with BADL. ? ?Patient progressing toward long  term goals..  Continue plan of care. ? ?OT Short Term Goals ?Week 3:  OT Short Term Goal 1 (Week 3): Pt will complete BSC transfer with mod assist squat pivot. ?OT Short Term Goal 1 - Progress (Week 3): Met ?OT Short Term Goal 2 (Week 3): Pt will complete LB dressing with mod assist at sit<>stand level using RW. ?OT Short Term Goal 2 - Progress (Week 3): Met ?OT Short Term Goal 3 (Week 3): Pt will tolerate standing for > 60 seconds in prep for LB self care. ?OT Short Term Goal 3 - Progress (Week 3): Met ?Week 4:  OT Short Term Goal 1 (Week 4): STGs=LTGs due to ELOS ? ?Skilled Therapeutic Interventions/Progress Updates:  ?  Pt semi reclined in bed, affect appearing flat, speaking slowly with difficulty processing and verbal output. AOx4 . Pt taking long pauses in between words and finally stating "milk of magnesia" and shaking head to therapist encouraging/offering self care and OOB. Pt stating "my stomach has been bothering me for days". Made nurse aware of pts reports and change in presentation.  Therapist re-educated pt on the importance of getting out of bed and participating in daily routine to encourage overall wellbeing and independence.  Pt appearing anxious with gentle encouragement and education and continuing to shake her head and saying "no, I just want to stay". Therefore pt missed 55 minutes of treatment. ? ?Therapy Documentation ?Precautions:  ?Precautions ?Precautions: Fall ?Precaution Comments: R hemi (RUE > RLE) ?Restrictions ?Weight Bearing Restrictions: No ? ? ? ?Therapy/Group: Individual Therapy ? ?Samantha [name] ?02/09/2022, 7:31 AM  ?

## 2022-02-09 NOTE — Progress Notes (Signed)
Occupational Therapy Session Note ? ?Patient Details  ?Name: Samantha [name] ?MRN: [id number] ?Date of Birth: 09-Mar-1956 ? ?Today's Date: 02/09/2022 ?OT Individual Time: 6283-1517 ?OT Individual Time Calculation (min): 23 min  and Today's Date: 02/09/2022 ?OT Missed Time: 37 Minutes ?Missed Time Reason: Patient ill (comment) (reported feeling sick) ? ? ?Short Term Goals: ?Week 3:  OT Short Term Goal 1 (Week 3): Pt will complete BSC transfer with mod assist squat pivot. ?OT Short Term Goal 2 (Week 3): Pt will complete LB dressing with mod assist at sit<>stand level using RW. ?OT Short Term Goal 3 (Week 3): Pt will tolerate standing for > 60 seconds in prep for LB self care. ? ?Skilled Therapeutic Interventions/Progress Updates:  ?Skilled OT intervention completed with focus on repositioning, d/c planning. Pt received slumped down in the bed, with initial flat affect. Pt reported she's been having female infection pains and loose stool, and her husband has been feeling sick as well. Unrated pain reported, nurse aware. Of note- pt's husband did not show for scheduled family education session. Therapist offered to reposition pt in bed, with pt agreeable with max A needed with bed in trendelenburg position and LLE in bridge position to assist pushing towards HOB. Remainder of time spent with therapist providing education on husband needing to practice transfers, as well as learn how to manage incontinence, bed level toileting prior to d/c, with CSW in room to verify recommendation for waiting to d/c until all equipment and necessary practice is in place. Pt was left upright in bed, with bed alarm on, RUE on pillow for hemi positioning and all needs in reach at end of session. ? ? ?Therapy Documentation ?Precautions:  ?Precautions ?Precautions: Fall ?Precaution Comments: R hemi (RUE > RLE) ?Restrictions ?Weight Bearing Restrictions: No ? ? ? ?Therapy/Group: Individual Therapy ? ?Sasuke Yaffe E [place] ?02/09/2022, 8:03 AM ?

## 2022-02-09 NOTE — Progress Notes (Signed)
Pharmacy Antibiotic Note ? ?Samantha [name] is a 66 y.o. female admitted on 01/17/2022 with UTI.  Pharmacy has been consulted for gentamicin dosing. ? ?Plan: ?Gentamicin 5 mg/kg IV x1 dose based on an adjusted body weight (350 mg) ? ?Height: 5' (152.4 cm) ?Weight: 125.4 kg (276 lb 7.3 oz) ?IBW/kg (Calculated) : 45.5 ?Adjusted IBW: 69.5 kg (dosing weight) ? ?Temp (24hrs), Avg:98.4 ?F (36.9 ?C), Min:98.2 ?F (36.8 ?C), Max:98.5 ?F (36.9 ?C) ? ?Recent Labs  ?Lab 02/05/22 ?0712 02/07/22 ?1348 02/09/22 ?7829  ?CREATININE 1.22* 1.44* 1.26*  ?  ?Estimated Creatinine Clearance: 55.2 mL/min (A) (by C-G formula based on SCr of 1.26 mg/dL (H)).   ? ?Allergies  ?Allergen Reactions  ? Penicillins Rash  ? ? ?Antimicrobials this admission: ?Bactrim 4/2 >> 4/5 ?Fosfomycin 4/5 >> 4/10 ?Gentamicin 4/14 >> 4/14 ? ? ? ?Microbiology results: ?4/9 UCx: E. Coli sensitive to gent  ? ?Pharmacy will continue to monitor for resolution of symptoms and discontinue the consult.  ?Thank you for allowing pharmacy to be a part of this patient?s care. ?Amrom Ore BS, PharmD, BCPS ?Clinical Pharmacist ?02/09/2022 12:48 PM ? ?Contact: [phone] after 3 PM ? ?"Be curious, not judgmental..." -Debbora Dus ?

## 2022-02-09 NOTE — Progress Notes (Signed)
Patient ID: Samantha [name], female   DOB: 06-16-1956, 66 y.o.   MRN: [id number]  Met with pt who is not feeling well again and MD checking for UTI. Pt wants to go home earlier than Tuesday-discussed if education complete and equipment received then if medically stable MD may release her. It is up to the MD regarding discharge. Gave her [employer] number to call regarding co-pays for equipment. Informed this needs to be paid before they will deliver equipment she will need for home. She will have husband call them. Husband has been through PT and was to do OT today, but pt plans to refuse. Continue to work on discharge needs. ?

## 2022-02-09 NOTE — Progress Notes (Signed)
Physical Therapy Weekly Progress Note ? ?Patient Details  ?Name: Samantha [name] ?MRN: [id number] ?Date of Birth: 1956-01-22 ? ?Beginning of progress report period: February 02, 2022 ?End of progress report period: February 09, 2022 ? ?Today's Date: 02/09/2022 ?PT Individual Time: 1015-1030 ?PT Individual Time Calculation (min): 15 min  and Today's Date: 02/09/2022 ?PT Missed Time: 45 Minutes ?Missed Time Reason: Patient unwilling to participate;Patient fatigue ? ?Patient has met 4 of 4 short term goals. Samantha [name] progress has slowed over the past week. Overall, she requires min to modA for bed mobility (depending on fatigue and effort), minA for sit<>stand transfers, modA for squat<>pivot transfers, and has ambulated 11f with +2 modA using RW. She remains incontinent of bladder, significant R sided ( RUE> RLE) weakness, poor activity tolerance and apathy towards therapy. Family training and education as began with her Husband, Samantha [name] who has shown adequate ability to assist patient with bed mobility, bed<>chair transfers, and car transfers. Samantha Dibblesconfirms that a ramp is being built for their home and doorways are wider than 33inches to accommodate for her 24inch wheelchair.  ? ?Patient continues to demonstrate the following deficits muscle weakness and muscle joint tightness, decreased cardiorespiratoy endurance, abnormal tone, unbalanced muscle activation, and motor apraxia, decreased attention to right and decreased motor planning, decreased initiation, decreased awareness, decreased problem solving, decreased safety awareness, decreased memory, and delayed processing, and decreased sitting balance, decreased standing balance, decreased postural control, hemiplegia, and decreased balance strategies and therefore will continue to benefit from skilled PT intervention to increase functional independence with mobility. ? ?Patient not progressing toward long term goals.  See goal revision..  Plan of care revisions: See POC  note for details. Downgraded to modA overall. DC ambulation and w/c mobility goals due to lack of progress, poor activity tolerance, decreased participation/effort during therapy, and R sided weakness. ? ?PT Short Term Goals ?Week 3:  PT Short Term Goal 1 (Week 3): Pt will complete bed mobility with modA ?PT Short Term Goal 2 (Week 3): Pt will complete bed<>chair transfers with modA and LRAD ?PT Short Term Goal 3 (Week 3): Pt will tolerate standing for 3 minutes to prepare for functional mobility ?PT Short Term Goal 4 (Week 3): Pt will ambulate 1105fwith maxA and LRAD ?Week 4: STG = LTG due to ELOS ? ?Skilled Therapeutic Interventions/Progress Updates:  ?   ? ?Pt supine in bed resting at start of session. She appears more confused and delayed compared to prior sessions, very soft voice and difficult to understand at times. Concern for recurrent UTI and relayed to MD. Pt refusing OOB mobility this morning, only agreeable to bed level there-ex. ?-1x10 anlke pumps on R (limited ROM, lacking ~20 deg to neutral) ?-1x8 heel slides (AAROM) ?-1x8 hip abduction (AAROM) ?-1x8 quad sets ?-1x8 glut sets ? ?Pt remained supine in bed at end of session, all needs met. She missed 45 minutes of therapy due to fatigue and refusal.  ? ?Therapy Documentation ?Precautions:  ?Precautions ?Precautions: Fall ?Precaution Comments: R hemi (RUE > RLE) ?Restrictions ?Weight Bearing Restrictions: No ?General: ?  ? ?Therapy/Group: Individual Therapy ? ?Samantha [name] ?02/09/2022, 7:36 AM  ?

## 2022-02-09 NOTE — Discharge Summary (Signed)
Physician Discharge Summary  ?Patient ID: ?Samantha [name] ?MRN: [id number] ?DOB/AGE: 06-28-1956 66 y.o. ? ?Admit date: 01/17/2022 ?Discharge date: 02/13/2022 ? ?Discharge Diagnoses:  ?Principal Problem: ?  CVA (cerebral vascular accident) ([place]) ?Active Problems: ?  Sinus tachycardia ?  Essential hypertension ?  Labile blood pressure ?  Uncontrolled type 2 diabetes mellitus with hyperglycemia ([place]) ?DVT involving left posterior tibial vein and left peroneal vein ?Diabetes mellitus ?Hypertension ?Hyperlipidemia ?Morbid obesity ?AKI ?E. coli UTI ? ?Discharged Condition: Stable ? ?Significant Diagnostic Studies: ?DG Abd 1 View ? ?Result Date: 01/27/2022 ?CLINICAL DATA:  Pain in lower central abdomen for 1 day. EXAM: ABDOMEN - 1 VIEW COMPARISON:  None. FINDINGS: The bowel gas pattern is normal. Calcifications are noted over the anticipated regions of the kidneys bilaterally measuring up to 6 mm on the left. Degenerative changes are present in the thoracolumbar spine. No acute osseous abnormality. IMPRESSION: 1. Nonobstructive bowel-gas pattern. 2. Calcifications projecting over the anticipated region of the kidneys bilaterally, possible nephrolithiasis. Electronically Signed   By: Brett Fairy M.D.   On: 01/27/2022 21:37  ? ?MR ANGIO HEAD WO CONTRAST ? ?Result Date: 01/14/2022 ?CLINICAL DATA:  66 year old female with evidence of progressed small vessel disease since 2020 on head CT yesterday. CTA head and neck positive for occluded or severely stenotic proximal bilateral MCA M2 branches. Confluent left corona radiata infarct on MRI today. EXAM: MRA HEAD WITHOUT CONTRAST TECHNIQUE: Angiographic images of the Circle of Willis were acquired using MRA technique without intravenous contrast. COMPARISON:  Brain MRI earlier today.  CTA head and neck yesterday. FINDINGS: Anterior circulation: Antegrade flow in both ICA siphons with bilateral siphon irregularity in keeping with atherosclerosis demonstrated by CTA yesterday. Moderate  right petrous segment stenosis appears to be genuine when compared to the CTA (series 1049, image 9). No hemodynamically significant left siphon stenosis. Patent carotid termini, MCA and ACA origins. Moderate stenosis of the right ACA origin is stable. Diminutive or absent anterior communicating artery. Mild ACA branch irregularity otherwise. The left MCA bifurcates early. And at the bifurcation there is severe stenosis of the superior M2 division over a segment of about 6 mm. There is some attenuation of the downstream left MCA branches. Appearance fairly concordant with the CTA. On the right there is mild irregularity of the M1 segment which is patent to a bifurcation or trifurcation. Severe short segment stenosis of the inferior right MCA M2 branch on series 1049, image 10, and another short segment severe stenosis in the posterior right M3 or M4 branch of that vessel (series 1049, image 16), concordant with the CTA. Posterior circulation: Antegrade flow in the posterior circulation with mild distal vertebral artery irregularity. Dominant left V4 segment. No significant distal vertebral stenosis. Patent PICA origins. Patent basilar artery with mildly fenestrated vertebrobasilar junction (normal variant). No basilar stenosis. Patent SCA and PCA origins with bilateral posterior communicating arteries. Mild bilateral P2 and P3 segment stenoses. Distal PCA branches are within normal limits. Anatomic variants: Mildly dominant left vertebral artery. Other: No intracranial mass effect or ventriculomegaly. IMPRESSION: 1. Advanced intracranial atherosclerosis, concordant with CTA yesterday and notable for: - moderate stenosis petrous right ICA. - moderate to severe stenosis bilateral MCA M2 branches. Some attenuation of distal left MCA branch flow. - moderate stenosis right ACA A1 segment. 2. No emergent large vessel occlusion. Electronically Signed   By: Genevie Ann M.D.   On: 01/14/2022 10:37  ? ?MR BRAIN WO  CONTRAST ? ?Result Date: 01/14/2022 ?CLINICAL DATA:  66 year old female with evidence  of progressed small vessel disease since 2020 on head CT yesterday. CTA head and neck positive for occluded or severely stenotic proximal bilateral MCA M2 branches. EXAM: MRI HEAD WITHOUT CONTRAST TECHNIQUE: Multiplanar, multiecho pulse sequences of the brain and surrounding structures were obtained without intravenous contrast. COMPARISON:  CTA head and neck and head CT yesterday. FINDINGS: Brain: Relatively large 3.6 cm area of restricted diffusion in the left corona radiata (series 5, image 78) with abnormal diffusion tracking toward the posterior left lentiform, and faintly involving the posterior deep white matter capsules (series 5, image 71). Associated T2 and FLAIR hyperintensity. No acute hemorrhage or mass effect. There is also possible punctate restricted diffusion in the right cerebellum on series 5, image 59 located near a moderate-sized area of chronic right SCA territory encephalomalacia. No other restricted diffusion. Chronic lacunar infarct in the left thalamus. Patchy and confluent bilateral cerebral white matter T2 and FLAIR hyperintensity in addition to the acute left corona radiata finding. And there is a subtle area of chronic cortical encephalomalacia suspected in the left superior parietal lobe on series 11, image 20. No other cerebral cortical encephalomalacia identified. There is some hemosiderin associated with the chronic right SCA infarct. No midline shift, mass effect, evidence of mass lesion, ventriculomegaly, extra-axial collection or acute intracranial hemorrhage. Cervicomedullary junction and pituitary are within normal limits. Vascular: Major intracranial vascular flow voids are preserved. Skull and upper cervical spine: Negative visible cervical spine. Visualized bone marrow signal is within normal limits. Sinuses/Orbits: Negative orbits. Stable paranasal sinuses and mastoids. Other: Visible  internal auditory structures appear normal. Negative visible scalp and face. IMPRESSION: 1. Confluent acute white matter infarct of the left corona radiata. No associated hemorrhage or mass effect. 2. Superimposed punctate acute on chronic right cerebellar SCA territory ischemia. No acute hemorrhage or mass effect. 3. Chronic left thalamic lacune. Moderately advanced bilateral white matter disease. Electronically Signed   By: Genevie Ann M.D.   On: 01/14/2022 06:39  ? ?US RENAL ? ?Result Date: 01/14/2022 ?CLINICAL DATA:  Acute renal failure EXAM: RENAL / URINARY TRACT ULTRASOUND COMPLETE COMPARISON:  None. FINDINGS: Right Kidney: Renal measurements: 14.0 x 5.1 x 6.2 cm = volume: 230 mL. Echogenicity within normal limits. No mass or hydronephrosis visualized. Left Kidney: Renal measurements: 9.9 x 4.4 x 4.3 cm = volume: 100 mL. Lobulated contour, suggesting scarring. Echogenicity within normal limits. No mass or hydronephrosis visualized. Bladder: Appears normal for degree of bladder distention. Other: None. IMPRESSION: 1. No evidence of hydronephrosis. 2. Left kidney is smaller than the right with lobular contour, suggesting cortical scarring from prior infection, obstruction, or ischemia. Electronically Signed   By: Delanna Ahmadi M.D.   On: 01/14/2022 12:35  ? ?ECHOCARDIOGRAM COMPLETE ? ?Result Date: 01/14/2022 ?   ECHOCARDIOGRAM REPORT   Patient Name:   Samantha [name] Date of Exam: 01/14/2022 Medical Rec #:  [id number]     Height:       60.0 in Accession #:    [id number]    Weight:       288.0 lb Date of Birth:  [birth year]     BSA:          2.180 m? Patient Age:    76 years      BP:           164/78 mmHg Patient Gender: F             HR:           93 bpm. Exam Location:  Inpatient  Procedure: 2D Echo, Cardiac Doppler and Color Doppler Indications:    Stroke  History:        Patient has no prior history of Echocardiogram examinations.  Sonographer:    Merrie Roof RDCS Referring Phys: [id number] [place]  1.  Left ventricular ejection fraction, by estimation, is 55 to 60%. The left ventricle has normal function. The left ventricle has no regional wall motion abnormalities. There is severe left ventricular hypertrophy. Indeterminat

## 2022-02-09 NOTE — Plan of Care (Signed)
Goals downgraded due to slow progress. DC ambulation and wheelchair goal - pt will be wheelchair level at discharge but will require assist for w/c mobility due to body habitus, decreased activity tolerance, and R sided weakness.  ? ? ?Problem: RH Ambulation ?Goal: LTG Patient will ambulate in controlled environment (PT) ?Description: LTG: Patient will ambulate in a controlled environment, # of feet with assistance (PT). ?Outcome: Not Applicable ?Goal: LTG Patient will ambulate in home environment (PT) ?Description: LTG: Patient will ambulate in home environment, # of feet with assistance (PT). ?Outcome: Not Applicable ?  ?Problem: RH Wheelchair Mobility ?Goal: LTG Patient will propel w/c in controlled environment (PT) ?Description: LTG: Patient will propel wheelchair in controlled environment, # of feet with assist (PT) ?Outcome: Not Applicable ?Goal: LTG Patient will propel w/c in home environment (PT) ?Description: LTG: Patient will propel wheelchair in home environment, # of feet with assistance (PT). ?Outcome: Not Applicable ?  ?Problem: RH Stairs ?Goal: LTG Patient will ambulate up and down stairs w/assist (PT) ?Description: LTG: Patient will ambulate up and down # of stairs with assistance (PT) ?Outcome: Not Applicable ?  ?Problem: RH Balance ?Goal: LTG Patient will maintain dynamic standing balance (PT) ?Description: LTG:  Patient will maintain dynamic standing balance with assistance during mobility activities (PT) ?Flowsheets (Taken 02/09/2022 0724) ?LTG: Pt will maintain dynamic standing balance during mobility activities with:: Moderate Assistance - Patient 50 - 74% ?  ?Problem: RH Bed Mobility ?Goal: LTG Patient will perform bed mobility with assist (PT) ?Description: LTG: Patient will perform bed mobility with assistance, with/without cues (PT). ?Flowsheets (Taken 02/09/2022 0724) ?LTG: Pt will perform bed mobility with assistance level of: Moderate Assistance - Patient 50 - 74% ?  ?Problem: RH Bed to  Chair Transfers ?Goal: LTG Patient will perform bed/chair transfers w/assist (PT) ?Description: LTG: Patient will perform bed to chair transfers with assistance (PT). ?Flowsheets (Taken 02/09/2022 0724) ?LTG: Pt will perform Bed to Chair Transfers with assistance level: Moderate Assistance - Patient 50 - 74% ?  ?Problem: RH Car Transfers ?Goal: LTG Patient will perform car transfers with assist (PT) ?Description: LTG: Patient will perform car transfers with assistance (PT). ?Flowsheets (Taken 02/09/2022 0724) ?LTG: Pt will perform car transfers with assist:: Moderate Assistance - Patient 50 - 74% ?  ?

## 2022-02-09 NOTE — Progress Notes (Signed)
Speech Language Pathology Daily Session Note ? ?Patient Details  ?Name: Samantha [name] ?MRN: [id number] ?Date of Birth: February 06, 1956 ? ?Today's Date: 02/09/2022 ?SLP Individual Time: 7322-0254 ?SLP Individual Time Calculation (min): 45 min ? ?Short Term Goals: ?Week 4: SLP Short Term Goal 1 (Week 4): STG's = LTG's due to ELOS ? ?Skilled Therapeutic Interventions: ?Pt seen, this AM, for skilled ST intervention targeting cognitive-linguistic goals outlined above. Pt encountered fatigued with depressed/flat affect. Pt reports pain with her stomach; LPN notified. Pt agreeable to ST intervention at bedside. ? ?SLP specifically targeted divergent + convergent verbal reasoning and recall of functional information. Pt stating basic problem in ADL photos x 5 with Min-Mod verbal and visual A. Required Max verbal and visual A for divergent reasoning, with pt answering "I don't know" on 4 out of 5 trials. Question if diminished mood, motivation, and/or stomach pain negatively impacted pt's performance. Required Min verbal A to increase vocal intensity in a mildly noisy environment. Recalled 1 out of 3 executive functioning strategies to implement with pill organizer upon discharge with Min A; 2 out of 3 with Max verbal A. Upon inquiry, pt reports she has been feeling more "moody" lately; MD notified. Will plan to complete family education prior to discharge. ? ?Session concluded with pt in bed, bed alarm on, call bell reviewed and within reach, and all immediate needs met. Continue per current ST POC.  ? ?Pain ?No/Denies pain; NAD ? ?Therapy/Group: Individual Therapy ? ?Marlyce Mcdougald A Elishua Radford ?02/09/2022, 1:00 PM ?

## 2022-02-09 NOTE — Plan of Care (Signed)
?  Problem: RH Expression Communication ?Goal: LTG Patient will increase speech intelligibility (SLP) ?Description: LTG: Patient will increase speech intelligibility at word/phrase/conversation level with cues, % of the time (SLP) ?Flowsheets (Taken 02/09/2022 0711) ?LTG: Patient will increase speech intelligibility (SLP): Modified Independent ?  ?Problem: RH Problem Solving ?Goal: LTG Patient will demonstrate problem solving for (SLP) ?Description: LTG:  Patient will demonstrate problem solving for basic/complex daily situations with cues  (SLP) ?Flowsheets (Taken 02/09/2022 0711) ?LTG: Patient will demonstrate problem solving for (SLP): Basic daily situations ?LTG Patient will demonstrate problem solving for: Moderate Assistance - Patient 50 - 74% ?Note: Downgraded due to slower than anticipated progress towards Min A, and inconsistent performance. ?  ?Problem: RH Memory ?Goal: LTG Patient will demonstrate ability for day to day (SLP) ?Description: LTG:   Patient will demonstrate ability for day to day recall/carryover during cognitive/linguistic activities with assist  (SLP) ?Flowsheets (Taken 02/09/2022 0711) ?LTG: Patient will demonstrate ability for day to day recall: New information ?LTG: Patient will demonstrate ability for day to day recall/carryover during cognitive/linguistic activities with assist (SLP): Minimal Assistance - Patient > 75% ?Note: Downgraded due to slower than anticipated progress towards Supervision goal.  ?Goal: LTG Patient will use memory compensatory aids to (SLP) ?Description: LTG:  Patient will use memory compensatory aids to recall biographical/new, daily complex information with cues (SLP) ?Flowsheets (Taken 02/09/2022 0711) ?LTG: Patient will use memory compensatory aids to (SLP): Supervision ?  ?

## 2022-02-09 NOTE — Progress Notes (Signed)
?                                                       PROGRESS NOTE ? ? ?Subjective/Complaints: ?She would like to go home earlier than Tuesday- this would be fine with me- sent message to team to discusse ?Foul smelling odor and more confused today- one time gentamcyin given today ? ?ROS:  ?Pt denies SOB,  CP, N/V/C/D, and vision changes, +fatigue, abdominal cramping resolved, +decreased motivation as per therapy, +foul smelling urine as per therapy ? ?Objective: ?  ?No results found. ?No results for input(s): WBC, HGB, HCT, PLT in the last 72 hours. ? ? ? ?Recent Labs  ?  02/07/22 ?1348 02/09/22 ?5726  ?NA 138 136  ?K 4.8 4.3  ?CL 109 106  ?CO2 21* 22  ?GLUCOSE 186* 139*  ?BUN 32* 33*  ?CREATININE 1.44* 1.26*  ?CALCIUM 9.0 8.6*  ? ? ? ?Intake/Output Summary (Last 24 hours) at 02/09/2022 1548 ?Last data filed at 02/09/2022 1327 ?Gross per 24 hour  ?Intake 1060 ml  ?Output --  ?Net 1060 ml  ?  ? ?  ? ?Physical Exam: ?Vital Signs ?Blood pressure 138/66, pulse 64, temperature 99 ?F (37.2 ?C), temperature source Oral, resp. rate 16, height 5' (1.524 m), weight 125.4 kg, SpO2 96 %. ?Gen: no distress, normal appearing ?HEENT: oral mucosa pink and moist, NCAT ?Cardio: Reg rate ?Chest: normal effort, normal rate of breathing ?Abd: soft, non-distended ?Ext: no edema ?Psych: pleasant, normal affect, more confused today ?Skin: intact ? ?Musculoskeletal:  ? ?Psych: pleasant and cooperative  ? ?Neurological:  ?   Comments: Patient is alert.  No acute distress.  Makes eye contact with examiner.  Oriented to person and place.  Fair awareness of deficits. RUE remains 0/5, RLE 1-2/5, Left side 5/5. Sensation intact. ?TOne MAS 2 in RIght finger flexors  ? ?Assessment/Plan: ?1. Functional deficits which require 3+ hours per day of interdisciplinary therapy in a comprehensive inpatient rehab setting. ?Physiatrist is providing close team supervision and 24 hour management of active medical problems listed below. ?Physiatrist and rehab  team continue to assess barriers to discharge/monitor patient progress toward functional and medical goals ? ?Care Tool: ? ?Bathing ?   ?Body parts bathed by patient: Face, Left upper leg, Chest, Right upper leg  ? Body parts bathed by helper: Right arm, Left arm, Front perineal area, Abdomen, Buttocks, Right lower leg, Left lower leg ?  ?  ?Bathing assist Assist Level: Maximal Assistance - Patient 24 - 49% ?  ?  ?Upper Body Dressing/Undressing ?Upper body dressing   ?What is the patient wearing?: Bra, Pull over shirt ?   ?Upper body assist Assist Level: Maximal Assistance - Patient 25 - 49% ?   ?Lower Body Dressing/Undressing ?Lower body dressing ? ? ?   ?What is the patient wearing?: Pants, Incontinence brief ? ?  ? ?Lower body assist Assist for lower body dressing: Maximal Assistance - Patient 25 - 49% ?   ? ?Toileting ?Toileting    ?Toileting assist Assist for toileting: Maximal Assistance - Patient 25 - 49% ?  ?  ?Transfers ?Chair/bed transfer ? ?Transfers assist ? Chair/bed transfer activity did not occur: Safety/medical concerns ? ?Chair/bed transfer assist level: Moderate Assistance - Patient 50 - 74% ?  ?  ?Locomotion ?Ambulation ? ? ?Ambulation assist ? ?  Ambulation activity did not occur: Safety/medical concerns ? ?  ?  ?   ? ?Walk 10 feet activity ? ? ?Assist ? Walk 10 feet activity did not occur: Safety/medical concerns ? ?  ?   ? ?Walk 50 feet activity ? ? ?Assist Walk 50 feet with 2 turns activity did not occur: Safety/medical concerns ? ?  ?   ? ? ?Walk 150 feet activity ? ? ?Assist Walk 150 feet activity did not occur: Safety/medical concerns ? ?  ?  ?  ? ?Walk 10 feet on uneven surface  ?activity ? ? ?Assist Walk 10 feet on uneven surfaces activity did not occur: Safety/medical concerns ? ? ?  ?   ? ?Wheelchair ? ? ? ? ?Assist Is the patient using a wheelchair?: Yes ?Type of Wheelchair: Manual ?  ? ?Wheelchair assist level: Dependent - Patient 0% ?Max wheelchair distance: 0  ? ? ?Wheelchair 50 feet  with 2 turns activity ? ? ? ?Assist ? ?  ?  ? ? ?Assist Level: Dependent - Patient 0%  ? ?Wheelchair 150 feet activity  ? ? ? ?Assist ?   ? ? ?Assist Level: Dependent - Patient 0%  ? ?Blood pressure 138/66, pulse 64, temperature 99 ?F (37.2 ?C), temperature source Oral, resp. rate 16, height 5' (1.524 m), weight 125.4 kg, SpO2 96 %. ? ?Medical Problem List and Plan: ?1. 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            -patient may shower ?            -ELOS/Goals: 4/18 MinA PT, S OT, S SLP,  ?            -HFU scheduled ? -Continue CIR- PT, OT and SLP    ? -discussed vagal nerve stimulator as an option in 6 months if still with weakness ?2.  Left lower extremity DVTs: Continue Eliquis until 04/30/21.  Discussed cost with patient and wife ?            -antiplatelet therapy: Aspirin 325 mg daily and Plavix 75 mg daily x3 months then aspirin alone ?3. Pain Management: N/A ?4. Depression: Increase Lexapro to 10mg . Provide emotional support ?            -antipsychotic agents: N/A ?5. Neuropsych: This patient is capable of making decisions on her own behalf. ?6. Diarrhea related perianal irritation, order Tucks  ?7. Fluids/Electrolytes/Nutrition:   ? -appears to have good appetite, eating  100% ?8.  Diabetes mellitus.  Hemoglobin A1c 13.1.  Insulin therapy as directed with diabetic teaching. Educated regarding avoiding foods with added sugar.  ? CBG (last 3)  ?Recent Labs  ?  02/08/22 ?2045 02/09/22 ?0604 02/09/22 ?1130  ?GLUCAP 163* 130* 144*  ? ?D/c metformin given AKI ? Continue insulin 35U ? ?9.  Hypertension. Metoprolol 25mg  BID Continue Norvasc 10 mg daily.  Monitor with increased mobility. D/c magnesium in case contributing to abdominal cramping ?Vitals:  ? 02/09/22 0531 02/09/22 1315  ?BP: 137/66 138/66  ?Pulse: 67 64  ?Resp: 16  16  ?Temp: 98.5 ?F (36.9 ?C) 99 ?F (37.2 ?C)  ?SpO2: 95% 96%  ? ?  ?10.  Hyperlipidemia.  LDL reviewed and is 115. Continue Lipitor ?11.  Morbid obesity.  BMI greater than 56. Continue heart healthy/carb modified diet ?12. Vitamin D  deficiency: start ergocalciferol 50,000U once per week for 7 weeks ?13. Insomnia: resolved with melatonin 3mg  HS, will change to prn. Placed sign on door not to wake before 5am.  ?14. AKI: nursing order to encourage 6-8 glasses of water per day, start IVF HS for 10 hours nightly, repeat BMP tomorrow.  ?15. Rash on posterior legs: hypoallergenic sheets and hydrocortisone cream ordered, resolved, d/c hydrocortisone  ?16. Tachycardia: resolved, continue lopressor 12.5mg  BID  ?17.  UTI: >100,000 E coli: one time IV gentamycin today ?18. Daytime fatigue: started modafinil 100mg  daily ? ? ? ?LOS: ?23 days ?A FACE TO FACE EVALUATION WAS PERFORMED ? ? P Maleaha Hughett ?02/09/2022, 3:48 PM  ? ?  ?

## 2022-02-10 DIAGNOSIS — R0989 Other specified symptoms and signs involving the circulatory and respiratory systems: Secondary | ICD-10-CM

## 2022-02-10 DIAGNOSIS — I10 Essential (primary) hypertension: Secondary | ICD-10-CM

## 2022-02-10 DIAGNOSIS — R000 Tachycardia, unspecified: Secondary | ICD-10-CM

## 2022-02-10 DIAGNOSIS — E1165 Type 2 diabetes mellitus with hyperglycemia: Secondary | ICD-10-CM

## 2022-02-10 LAB — GLUCOSE, CAPILLARY
Glucose-Capillary: 106 mg/dL — ABNORMAL HIGH (ref 70–99)
Glucose-Capillary: 162 mg/dL — ABNORMAL HIGH (ref 70–99)
Glucose-Capillary: 139 mg/dL — ABNORMAL HIGH (ref 70–99)
Glucose-Capillary: 103 mg/dL — ABNORMAL HIGH (ref 70–99)

## 2022-02-10 NOTE — Progress Notes (Signed)
?                                                       PROGRESS NOTE ? ? ?Subjective/Complaints: ?Patient seen laying in bed this AM.  She states she slept well overnight and she is feeling better.  ? ?ROS:  Denies CP, SOB, N/V/D ? ?Objective: ?  ?No results found. ?No results for input(s): WBC, HGB, HCT, PLT in the last 72 hours. ? ? ? ?Recent Labs  ?  02/07/22 ?1348 02/09/22 ?0768  ?NA 138 136  ?K 4.8 4.3  ?CL 109 106  ?CO2 21* 22  ?GLUCOSE 186* 139*  ?BUN 32* 33*  ?CREATININE 1.44* 1.26*  ?CALCIUM 9.0 8.6*  ? ? ? ? ?Intake/Output Summary (Last 24 hours) at 02/10/2022 1000 ?Last data filed at 02/10/2022 0743 ?Gross per 24 hour  ?Intake 737.16 ml  ?Output --  ?Net 737.16 ml  ? ?  ? ?  ? ?Physical Exam: ?Vital Signs ?Blood pressure (!) 123/48, pulse 61, temperature 97.7 ?F (36.5 ?C), temperature source Oral, resp. rate 18, height 5' (1.524 m), weight 125.4 kg, SpO2 99 %. ?Gen: NAD, normal appearing ?HEENT: oral mucosa pink and moist, NCAT ?Cardio: Reg rate ?Chest: normal effort, normal rate of breathing ?Abd: soft, non-distended ?Ext: no edema ?Psych: pleasant, slightly flat ?Skin: intact ?Musculoskeletal:  ?No tenderness in extremities ?Psych: pleasant and cooperative  ?Neurological:  ?Alert ?RUE 0/5, RLE 1-2/5, Left side 5/5.  ?Sensation intact to light touch. ? ?Assessment/Plan: ?1. Functional deficits which require 3+ hours per day of interdisciplinary therapy in a comprehensive inpatient rehab setting. ?Physiatrist is providing close team supervision and 24 hour management of active medical problems listed below. ?Physiatrist and rehab team continue to assess barriers to discharge/monitor patient progress toward functional and medical goals ? ?Care Tool: ? ?Bathing ?   ?Body parts bathed by patient: Face, Left upper leg, Chest, Right upper leg  ? Body parts bathed by helper: Right arm, Left arm, Front perineal area, Abdomen, Buttocks, Right lower leg, Left lower leg ?  ?  ?Bathing assist Assist Level: Maximal  Assistance - Patient 24 - 49% ?  ?  ?Upper Body Dressing/Undressing ?Upper body dressing   ?What is the patient wearing?: Bra, Pull over shirt ?   ?Upper body assist Assist Level: Maximal Assistance - Patient 25 - 49% ?   ?Lower Body Dressing/Undressing ?Lower body dressing ? ? ?   ?What is the patient wearing?: Pants, Incontinence brief ? ?  ? ?Lower body assist Assist for lower body dressing: Maximal Assistance - Patient 25 - 49% ?   ? ?Toileting ?Toileting    ?Toileting assist Assist for toileting: Maximal Assistance - Patient 25 - 49% ?  ?  ?Transfers ?Chair/bed transfer ? ?Transfers assist ? Chair/bed transfer activity did not occur: Safety/medical concerns ? ?Chair/bed transfer assist level: Moderate Assistance - Patient 50 - 74% ?  ?  ?Locomotion ?Ambulation ? ? ?Ambulation assist ? ? Ambulation activity did not occur: Safety/medical concerns ? ?  ?  ?   ? ?Walk 10 feet activity ? ? ?Assist ? Walk 10 feet activity did not occur: Safety/medical concerns ? ?  ?   ? ?Walk 50 feet activity ? ? ?Assist Walk 50 feet with 2 turns activity did not occur: Safety/medical concerns ? ?  ?   ? ? ?  Walk 150 feet activity ? ? ?Assist Walk 150 feet activity did not occur: Safety/medical concerns ? ?  ?  ?  ? ?Walk 10 feet on uneven surface  ?activity ? ? ?Assist Walk 10 feet on uneven surfaces activity did not occur: Safety/medical concerns ? ? ?  ?   ? ?Wheelchair ? ? ? ? ?Assist Is the patient using a wheelchair?: Yes ?Type of Wheelchair: Manual ?  ? ?Wheelchair assist level: Dependent - Patient 0% ?Max wheelchair distance: 0  ? ? ?Wheelchair 50 feet with 2 turns activity ? ? ? ?Assist ? ?  ?  ? ? ?Assist Level: Dependent - Patient 0%  ? ?Wheelchair 150 feet activity  ? ? ? ?Assist ?   ? ? ?Assist Level: Dependent - Patient 0%  ? ?Blood pressure (!) 123/48, pulse 61, temperature 97.7 ?F (36.5 ?C), temperature source Oral, resp. rate 18, height 5' (1.524 m), weight 125.4 kg, SpO2 99 %. ? ?Medical Problem List and Plan: ?1.  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 Continue CIR ?2.  Left lower extremity DVTs: Continue Eliquis until 04/30/21.  Discussed cost with patient and wife ?            -antiplatelet therapy: Aspirin 325 mg daily and Plavix 75 mg daily x3 months then aspirin alone ?3. Pain Management: N/A ?4. Depression: Increase Lexapro to 10mg . Provide emotional support ?            -antipsychotic agents: N/A ?5. Neuropsych: This patient is capable of making decisions on her own behalf. ?6. Diarrhea related perianal irritation, order Tucks  ?7. Fluids/Electrolytes/Nutrition:   ? -appears to have good appetite, eating  100% ?8.  Diabetes mellitus with hyperglycemia.  Hemoglobin A1c 13.1.  Insulin therapy as directed with diabetic teaching. Educated regarding avoiding foods with added sugar.  ? CBG (last 3)  ?Recent Labs  ?  02/09/22 ?1656 02/09/22 ?2057 02/10/22 ?02/12/22  ?GLUCAP 70 121* 106*  ? ? ?D/c metformin given AKI ? Continue insulin 35U ? Relatively controlled on 4/15 ? ?9.  Hypertension. Metoprolol 25mg  BID Continue Norvasc 10 mg daily.  Monitor with increased mobility. D/c magnesium in case contributing to abdominal cramping ?Vitals:  ? 02/09/22 2001 02/10/22 02/11/22  ?BP: (!) 141/61 (!) 123/48  ?Pulse: 65 61  ?Resp: 16 18  ?Temp: 98.9 ?F (37.2 ?C) 97.7 ?F (36.5 ?C)  ?SpO2: 96% 99%  ? Slightly labile on 4/15, monitor for trend ?10. Hyperlipidemia.  LDL reviewed and is 115. Continue Lipitor ?11. Morbid obesity.  BMI greater than 56. Continue heart healthy/carb modified diet ?12. Vitamin D deficiency: start ergocalciferol 50,000U once per week for 7 weeks ?13. Insomnia: resolved with melatonin 3mg  HS, will change to prn. Placed sign on door not to wake before 5am.  ?14. AKI: nursing order to encourage 6-8 glasses of water per day, start IVF HS  for 10 hours nightly ?Cr 1.26 on 4/14, cont to monitor  ?15. Rash on posterior legs: hypoallergenic sheets and hydrocortisone cream ordered, resolved, d/c hydrocortisone  ?16. Tachycardia: continue lopressor 12.5mg  BID  ? Controlled on 4/15 ?17. UTI: >100,000 E coli: one time IV gentamycin ?18. Daytime fatigue: started modafinil 100mg  daily ? ?LOS: ?24 days ?A FACE TO FACE EVALUATION WAS PERFORMED ? ?Yukiko Minnich ?02/10/2022, 10:00 AM  ? ?  ?

## 2022-02-11 LAB — GLUCOSE, CAPILLARY
Glucose-Capillary: 79 mg/dL (ref 70–99)
Glucose-Capillary: 125 mg/dL — ABNORMAL HIGH (ref 70–99)
Glucose-Capillary: 93 mg/dL (ref 70–99)
Glucose-Capillary: 149 mg/dL — ABNORMAL HIGH (ref 70–99)

## 2022-02-11 NOTE — Progress Notes (Signed)
Orthopedic Tech Progress Note ?Patient Details:  ?Samantha [name] ?1956/07/09 ?[id number] ? ?Order for Resting WHO brace replacement called into [facility] at 1920. ? ?Patient ID: Samantha [name], female   DOB: 1955/11/12, 66 y.o.   MRN: [id number] ? ?Samantha [name] ?02/11/2022, 7:26 PM ? ?

## 2022-02-11 NOTE — Progress Notes (Signed)
Speech Language Pathology Daily Session Note ? ?Patient Details  ?Name: Samantha [name] ?MRN: [id number] ?Date of Birth: 04/28/1956 ? ?Today's Date: 02/11/2022 ?SLP Individual Time: [id number] ?SLP Individual Time Calculation (min): 45 min ? ?Short Term Goals: ?Week 4: SLP Short Term Goal 1 (Week 4): STG's = LTG's due to ELOS ? ?Skilled Therapeutic Interventions: Skilled SLP intervention focused on cognition. Pt completed short term memory recall task with functional messages with mod A verbal cues including repetition of information and visual cues. Educated pt that she will need to have family keep track of important appointments and scheduled events with calendar or planner. She verbalized understanding. Pt completed functional problem solving task with time calculations with max A to increase recall of specific details and complete mental calculations. Pt left seated upright in bed with bed alarm set and call button within reach. Cont with therapy per plan of care.  ?   ? ?Pain ?Pain Assessment ?Pain Scale: Faces ?Faces Pain Scale: No hurt ? ?Therapy/Group: Individual Therapy ? ?Samantha [name] A Samantha [name] ?02/11/2022, 9:55 AM ?

## 2022-02-11 NOTE — Progress Notes (Signed)
Physical Therapy Session Note ? ?Patient Details  ?Name: Samantha [name] ?MRN: [id number] ?Date of Birth: 01-Dec-1955 ? ?Today's Date: 02/11/2022 ?PT Individual Time: [id number] ?PT Individual Time Calculation (min): 55 min  ? ?Short Term Goals: ?Week 4:  PT Short Term Goal 1 (Week 4): STG = LTG due to ELOS ? ?Skilled Therapeutic Interventions/Progress Updates:  ?  Pt presents in bed and slow to engage. She does report she is ready to d/c and was hoping to leave soon. Reviewed her d/c date as currently set for 4/18. Pt required extra time for verbalizing certain words when requesting to adjust fan, picking out clothes, etc. Max assist needed with extra time and several attempts to come to the EOB to the R. Multimodal cues needed for faciliating scooting forward as well. Pt required occasional min assist for dynamic sitting balance at times from behind due to LOB to the R. PT assisted with donning of socks and shoes for time management. Pt able to verbalize hemi dressing technique but required hand over hand assist to complete for donning of shirt and max assist for threading of pants. Min assist for functional balance while donning of shirt. Multiple attempts made with various techniques, hand placement, elevation of surface (issue with bed also causing it not to stay elevated) and + 2 assistance but pt unable to clear bottom and come into standing position. Gave patient several breaks and retried with different technique. Offered the stedy to attempt to have something more stable to pull from, but pt declines and request so just lay back down and rest. After several more unsuccessful attempts, pt starts to lay herself back down in the bed and states she just needs to rest. +2 assist for repositioning in the bed and max assist for rolling for changing of soiled brief (urine) and total assist for hygiene. Repositioned at end of session with all needs in reach.  ? ?Therapy Documentation ?Precautions:   ?Precautions ?Precautions: Fall ?Precaution Comments: R hemi (RUE > RLE) ?Restrictions ?Weight Bearing Restrictions: No ? ?Pain: ? Did not complain of pain. ? ? ?Therapy/Group: Individual Therapy ? ?Tedd Sias ?Philip Aspen, PT, DPT, CBIS ? ?02/11/2022, 2:16 PM  ?

## 2022-02-12 ENCOUNTER — Other Ambulatory Visit (HOSPITAL_COMMUNITY): Payer: Self-pay

## 2022-02-12 LAB — GLUCOSE, CAPILLARY
Glucose-Capillary: 75 mg/dL (ref 70–99)
Glucose-Capillary: 150 mg/dL — ABNORMAL HIGH (ref 70–99)
Glucose-Capillary: 117 mg/dL — ABNORMAL HIGH (ref 70–99)
Glucose-Capillary: 126 mg/dL — ABNORMAL HIGH (ref 70–99)

## 2022-02-12 MED ORDER — BACLOFEN 5 MG PO TABS
5.0000 mg | ORAL_TABLET | Freq: Every day | ORAL | 0 refills | Status: DC
  Filled 2022-02-12: qty 30, 30d supply, fill #0

## 2022-02-12 MED ORDER — APIXABAN 5 MG PO TABS
ORAL_TABLET | ORAL | 2 refills | Status: DC
  Filled 2022-02-12: qty 60, 30d supply, fill #0

## 2022-02-12 MED ORDER — INSULIN GLARGINE 100 UNIT/ML SOLOSTAR PEN
34.0000 [IU] | PEN_INJECTOR | Freq: Every day | SUBCUTANEOUS | 1 refills | Status: DC
  Filled 2022-02-12: qty 9, 26d supply, fill #0

## 2022-02-12 MED ORDER — METOPROLOL TARTRATE 50 MG PO TABS
50.0000 mg | ORAL_TABLET | Freq: Two times a day (BID) | ORAL | 0 refills | Status: DC
  Filled 2022-02-12: qty 60, 30d supply, fill #0

## 2022-02-12 MED ORDER — ASPIRIN EC 81 MG PO TBEC: 81.0000 mg | DELAYED_RELEASE_TABLET | Freq: Every day | ORAL | Status: DC

## 2022-02-12 MED ORDER — ACETAMINOPHEN 325 MG PO TABS: 650.0000 mg | ORAL_TABLET | ORAL | Status: AC | PRN

## 2022-02-12 MED ORDER — AMLODIPINE BESYLATE 10 MG PO TABS
10.0000 mg | ORAL_TABLET | Freq: Every day | ORAL | 0 refills | Status: DC
  Filled 2022-02-12: qty 30, 30d supply, fill #0

## 2022-02-12 MED ORDER — APIXABAN 5 MG PO TABS
5.0000 mg | ORAL_TABLET | Freq: Two times a day (BID) | ORAL | Status: DC
  Administered 2022-02-12 – 2022-02-13 (×3): 5 mg via ORAL
  Filled 2022-02-12 (×3): qty 1

## 2022-02-12 MED ORDER — CLOPIDOGREL BISULFATE 75 MG PO TABS
75.0000 mg | ORAL_TABLET | Freq: Every day | ORAL | 0 refills | Status: DC
  Filled 2022-02-12: qty 30, 30d supply, fill #0

## 2022-02-12 MED ORDER — ASPIRIN 81 MG PO CHEW
81.0000 mg | CHEWABLE_TABLET | Freq: Every day | ORAL | 11 refills | Status: DC
  Filled 2022-02-12: qty 36, 36d supply, fill #0

## 2022-02-12 MED ORDER — INSULIN GLARGINE-YFGN 100 UNIT/ML ~~LOC~~ SOLN
35.0000 [IU] | Freq: Every day | SUBCUTANEOUS | 11 refills | Status: DC
Start: 2022-02-12 — End: 2022-02-12
  Filled 2022-02-12: qty 10, 28d supply, fill #0

## 2022-02-12 MED ORDER — ATORVASTATIN CALCIUM 80 MG PO TABS
80.0000 mg | ORAL_TABLET | Freq: Every day | ORAL | 0 refills | Status: DC
  Filled 2022-02-12: qty 30, 30d supply, fill #0

## 2022-02-12 MED ORDER — ESCITALOPRAM OXALATE 10 MG PO TABS
10.0000 mg | ORAL_TABLET | Freq: Every day | ORAL | 0 refills | Status: DC
  Filled 2022-02-12: qty 30, 30d supply, fill #0

## 2022-02-12 MED ORDER — INSULIN GLARGINE 100 UNIT/ML SOLOSTAR PEN
35.0000 [IU] | PEN_INJECTOR | Freq: Every day | SUBCUTANEOUS | 1 refills | Status: DC
  Filled 2022-02-12: qty 12, 30d supply, fill #0

## 2022-02-12 MED ORDER — MELATONIN 3 MG PO TABS
3.0000 mg | ORAL_TABLET | Freq: Every evening | ORAL | 0 refills | Status: DC | PRN
  Filled 2022-02-12: qty 30, 30d supply, fill #0

## 2022-02-12 MED ORDER — VITAMIN D (ERGOCALCIFEROL) 1.25 MG (50000 UNIT) PO CAPS
50000.0000 [IU] | ORAL_CAPSULE | ORAL | 0 refills | Status: DC
  Filled 2022-02-12: qty 4, 28d supply, fill #0

## 2022-02-12 MED ORDER — MODAFINIL 100 MG PO TABS
100.0000 mg | ORAL_TABLET | Freq: Every day | ORAL | 0 refills | Status: DC
  Filled 2022-02-12: qty 30, 30d supply, fill #0

## 2022-02-12 MED ORDER — INSULIN GLARGINE-YFGN 100 UNIT/ML ~~LOC~~ SOLN
34.0000 [IU] | Freq: Every day | SUBCUTANEOUS | Status: DC
  Administered 2022-02-13: 34 [IU] via SUBCUTANEOUS
  Filled 2022-02-12 (×2): qty 0.34

## 2022-02-12 MED ORDER — INSULIN PEN NEEDLE 32G X 4 MM MISC
30.0000 | Freq: Every day | 0 refills | Status: DC
Start: 2022-02-12 — End: 2022-02-26
  Filled 2022-02-12: qty 100, 25d supply, fill #0

## 2022-02-12 NOTE — Progress Notes (Signed)
Patient ID: Samantha [name], female   DOB: 1955-12-13, 66 y.o.   MRN: [id number]  met wit pt to se if husband called regarding her equipment . She told him too this worker reached out to husband and gave him the number again since he reports pt did not tell him to call. Informed both of them the equipment she os using is the hospital's equipment and would not go home with her. Husband to call [employer] regarding equipment. ?

## 2022-02-12 NOTE — TOC Benefit Eligibility Note (Signed)
Patient Advocate Encounter ?  ?Received notification fthat prior authorization for Modafinil (Provigil) 100 mg tablets is required. ?  ?PA submitted on 02/12/2022 ?Key [id number] ?Status is pending ?   ? ? ? ?Lyndel Safe, CPhT ?Pharmacy Patient Advocate Specialist ?[facility] Patient Advocate Team ?Direct Number: [phone]  Fax: [phone]  ?

## 2022-02-12 NOTE — Progress Notes (Signed)
Inpatient Rehabilitation Care Coordinator ?Discharge Note  ? ?Patient Details  ?Name: Samantha [name] ?MRN: [id number] ?Date of Birth: Mar 03, 1956 ? ? ?Discharge location: [place] ? ?Length of Stay: 27 days ? ?Discharge activity level: MIN-MOD LEVEL OF ASSIST-WHEELCHAIR LEVEL ? ?Home/community participation: ACTIVE ? ?Patient response [id number] Literacy - How often do you need to have someone help you when you read instructions, pamphlets, or other written material from your doctor or pharmacy?: Never ? ?Patient response [id number] Isolation - How often do you feel lonely or isolated from those around you?: Never ? ?Services provided included: MD, RD, PT, OT, SLP, RN, CM, TR, Pharmacy, Neuropsych, SW ? ?Financial Services:  ?Charity fundraiser Utilized: Private Insurance ?[employer] ? ?Choices offered to/list presented to: PT ? ?Follow-up services arranged:  ?Home Health, DME, Patient/Family has no preference for HH/DME agencies ?Home Health Agency: [facility]  ?  ?DME : [facility], [facility] BEDSIDE COMMODE AND TRANSFER BOARD ?  ? ?Patient response to transportation need: ?Is the patient able to respond to transportation needs?: Yes ?In the past 12 months, has lack of transportation kept you from medical appointments or from getting medications?: No ?In the past 12 months, has lack of transportation kept you from meetings, work, or from getting things needed for daily living?: No ? ? ? ?Comments (or additional information):HUSBAND WAS IN FOR HANDS ON EDUCATION AND FEELS HE CAN PROVIDE HER CARE. HE WILL SHOW OTHER FAMILY MEMBERS ALTHOUGH ASKED THEM TO COME IN ALSO BUT DID NOT. PT REFUSED AND WAS SELF LIMITING IN HER THERAPY SESSIONS JUST WANTED TO GO HOME ? ?Patient/Family verbalized understanding of follow-up arrangements:  Yes ? ?Individual responsible for coordination of the follow-up plan: Samantha [name]-HUSBAND [phone] ? ?Confirmed correct DME delivered: [name] 02/12/2022   ? ?[name] ?

## 2022-02-12 NOTE — Plan of Care (Signed)
?  Problem: RH Expression Communication ?Goal: LTG Patient will increase speech intelligibility (SLP) ?Description: LTG: Patient will increase speech intelligibility at word/phrase/conversation level with cues, % of the time (SLP) ?Outcome: Not Met (add Reason) ?Note: Did not meet due to flat affect and severity of deficits. ?  ?Problem: RH Memory ?Goal: LTG Patient will use memory compensatory aids to (SLP) ?Description: LTG:  Patient will use memory compensatory aids to recall biographical/new, daily complex information with cues (SLP) ?Outcome: Not Met (add Reason) ?Note: Did not meet due to lack of motivation and severity of deficits. ?  ?

## 2022-02-12 NOTE — Plan of Care (Signed)
Patient remains incontinent despite toileting protocol use ?

## 2022-02-12 NOTE — Progress Notes (Signed)
Occupational Therapy Discharge Summary ? ?Patient Details  ?Name: Samantha [name] ?MRN: [id number] ?Date of Birth: [birth year] ? ?Today's Date: 02/12/2022 ?OT Individual Time: 4098-1191 ?OT Individual Time Calculation (min): 25 min  ? ?Skilled Treatment ?Pt semi reclined in bed, reporting she feels so tired and just got back in bed.  Needing a lot of encouragement to participate with skilled OT, and pt agreed to dress/bathe UB sitting EOB.  Pt required mod assist right roll and sidelying to sit.  Doffed shirt with min assist and Vcs to initiate.  Bathed UB using long handled reacher with setup and min assist for thoroughness.  Donned clean shirt with setup and min assist to initiate threading over right hand first and to pull down fully on right side.  Pt politely refusing any other EOB task and requesting back to bed due to fatigue from sitting up this morning.  Sit to supine with mod assist.  Pt bridged towards [facility] with mod assist and bed rail features.  Call bell in reach, bed alarm on. ? ?Patient has met 5 of 6 long term goals due to improved balance, postural control, ability to compensate for deficits, and functional use of  RIGHT lower extremity. Long term goals were downgraded last week due to pts slow progress and limited participation.  Patient frequently responding that she is just too tired when encouraged to participate with skilled OT.  Patient to discharge at overall Mod Assist level with the exception of total assist for toileting due to incontinence persisting. Pt exhibits significant RUE and RLE weakness with mild neglect and low frustration tolerance limiting her participation in neuro re-ed. Pt still needs cueing for hemi strategy follow through despite repetitive training as well.  Pt is limited by poor standing endurance/tolerance during basic LB self care tasks and requires frequent rest breaks and increased time to complete at sink. Patient's care partner did not participate in recommended family  education however per pt report he is independent to provide the necessary physical and cognitive assistance at discharge.   ? ?Reasons goals not met: Pt requires total assist for toileting due to incontinence and goal was set at max assist. ? ?Recommendation:  ?Patient will benefit from ongoing skilled OT services in home health setting to continue to advance functional skills in the area of BADL. ? ?Equipment: ?Bedside commode, w/c, RW; recommended hospital bed but pt refused this. ? ?Reasons for discharge: treatment goals met and discharge from hospital ? ?Patient/family agrees with progress made and goals achieved: Yes ? ?OT Discharge ?Precautions/Restrictions  ?Precautions ?Precautions: Fall ?Precaution Comments: R hemi (RUE > RLE) ?Restrictions ?Weight Bearing Restrictions: No ?Pain ?Pain Assessment ?Pain Scale: 0-10 ?Pain Score: 0-No pain ?ADL ?ADL ?Eating: Set up ?Where Assessed-Eating: Wheelchair ?Grooming: Minimal assistance ?Where Assessed-Grooming: Sitting at sink ?Upper Body Bathing: Minimal assistance ?Where Assessed-Upper Body Bathing: Edge of bed ?Lower Body Bathing: Moderate assistance (using long handled sponge) ?Where Assessed-Lower Body Bathing: Sitting at sink, Standing at sink ?Upper Body Dressing: Minimal assistance ?Where Assessed-Upper Body Dressing: Edge of bed ?Lower Body Dressing: Moderate assistance ?Where Assessed-Lower Body Dressing: Sitting at sink, Standing at sink, Edge of bed, Wheelchair ?Toileting: Dependent ?Where Assessed-Toileting: Bed level ?Toilet Transfer: Moderate assistance ?Toilet Transfer Method: Stand pivot, Squat pivot ?Science writer: Extra wide bedside commode ?Vision ?Baseline Vision/History: 1 Wears glasses ?Patient Visual Report: No change from baseline ?Vision Assessment?: No apparent visual deficits ?Perception  ?Perception: Within Functional Limits ?Praxis ?Praxis: Intact ?Cognition ?Cognition ?Overall Cognitive Status: Impaired/Different from  baseline ?  Arousal/Alertness: Awake/alert ?Orientation Level: Person;Place;Situation ?Person: Oriented ?Place: Oriented ?Situation: Oriented ?Memory: Appears intact ?Attention: Selective;Alternating ?Focused Attention: Impaired ?Focused Attention Impairment: Verbal basic;Functional basic ?Sustained Attention: Impaired ?Sustained Attention Impairment: Verbal basic;Functional basic ?Selective Attention: Impaired ?Selective Attention Impairment: Verbal basic;Functional basic ?Awareness: Impaired ?Awareness Impairment: Emergent impairment ?Problem Solving: Impaired ?Problem Solving Impairment: Functional basic;Verbal basic ?Executive Function: Initiating;Organizing;Self Correcting;Decision Making ?Sequencing: Impaired ?Sequencing Impairment: Verbal basic;Functional basic ?Organizing: Impaired ?Organizing Impairment: Verbal basic;Functional basic ?Decision Making: Impaired ?Decision Making Impairment: Functional basic;Verbal basic ?Initiating: Impaired ?Initiating Impairment: Functional basic;Verbal basic ?Self Correcting: Impaired ?Self Correcting Impairment: Functional basic;Verbal basic ?Behaviors: Poor frustration tolerance ?Safety/Judgment: Impaired ?Brief Interview for Mental Status (BIMS) ?Repetition of Three Words (First Attempt): 3 ?Temporal Orientation: Year: Correct ?Temporal Orientation: Month: Accurate within 5 days ?Temporal Orientation: Day: Correct ?Recall: "Sock": Yes, no cue required ?Recall: "Blue": Yes, no cue required ?Recall: "Bed": Yes, no cue required ?BIMS Summary Score: 15 ?Sensation ?Sensation ?Light Touch: Appears Intact ?Hot/Cold: Appears Intact ?Proprioception: Impaired Detail ?Proprioception Impaired Details: Absent RUE;Absent RLE ?Stereognosis: Impaired by gross assessment ?Coordination ?Gross Motor Movements are Fluid and Coordinated: No ?Fine Motor Movements are Fluid and Coordinated: No ?Finger Nose Finger Test: UTA RUE; WNL LUE ?Motor  ?Motor ?Motor: Hemiplegia ?Motor - Skilled Clinical  Observations: Right sided ?Mobility  ?Bed Mobility ?Bed Mobility: Rolling Right;Rolling Left;Right Sidelying to Sit;Sit to Supine ?Rolling Right: Moderate Assistance - Patient 50-74% ?Rolling Left: Total Assistance - Patient < 25% ?Right Sidelying to Sit: Total Assistance - Patient < 25% ?Sit to Supine: Maximal Assistance - Patient 25-49% ?Transfers ?Sit to Stand: Moderate Assistance - Patient 50-74% ?Stand to Sit: Moderate Assistance - Patient 50-74%  ?Trunk/Postural Assessment  ?Cervical Assessment ?Cervical Assessment: Exceptions to [facility] (forward head) ?Thoracic Assessment ?Thoracic Assessment: Exceptions to [facility] (rounded shoulders; depressed right shoulder) ?Lumbar Assessment ?Lumbar Assessment: Exceptions to [facility] (posterior pelvic tilt) ?Postural Control ?Postural Control: Deficits on evaluation ?Righting Reactions: delayed  ?Balance ?Balance ?Balance Assessed: Yes ?Static Sitting Balance ?Static Sitting - Balance Support: Feet supported;Left upper extremity supported ?Static Sitting - Level of Assistance: 5: Stand by assistance ?Dynamic Sitting Balance ?Dynamic Sitting - Balance Support: Right upper extremity supported;During functional activity;Feet supported ?Dynamic Sitting - Level of Assistance: 4: Min assist ?Static Standing Balance ?Static Standing - Balance Support: Left upper extremity supported ?Static Standing - Level of Assistance: 4: Min assist;5: Stand by assistance ?Dynamic Standing Balance ?Dynamic Standing - Balance Support: During functional activity;Left upper extremity supported ?Dynamic Standing - Level of Assistance: 3: Mod assist ?Extremity/Trunk Assessment ?RUE Assessment ?RUE Assessment: Exceptions to [facility] ?General Strength Comments: trace Proximally; 0/5 distally ?LUE Assessment ?LUE Assessment: Exceptions to [facility] ?General Strength Comments: 3+/5 ? ? ?Caryl Asp Husein Guedes ?02/12/2022, 1:41 PM ?

## 2022-02-12 NOTE — Progress Notes (Signed)
Educated pt/pt husband/daughter on insulin pen use. Demo returned satisfactory. Pt family have no questions or concerns.  ?Mylo Red, LPN  ?

## 2022-02-12 NOTE — Discharge Summary (Signed)
Physical Therapy Discharge Summary ? ?Patient Details  ?Name: Samantha [name] ?MRN: [id number] ?Date of Birth: 1956/04/14 ? ?Today's Date: 02/12/2022 ?PT Individual Time: 4944-9675 ?PT Individual Time Calculation (min): 25 min  ? ? ?Patient has met 5 of 5 long term goals due to improved activity tolerance, improved balance, improved postural control, increased strength, ability to compensate for deficits, functional use of  right lower extremity, and improved awareness.  Patient to discharge at an ambulatory level Ullin.   Patient's care partner is independent to provide the necessary physical and cognitive assistance at discharge. Her husband, Eddie Dibbles, received hands on training for bed mobility, bed<>chair transfers, and car transfers with adequate ability to manage her mobility safely. He did not show up to every family education/training session but he did confirm that a ramp was being built for home entrance. Her progress was limited during her rehabilitation due to apathy towards therapy, poor activity tolerance, dense R sided (RUE > RLE) weakness, and multiple refusals during her stay.  ? ?Reasons goals not met: n/a ? ?Recommendation:  ?Patient will benefit from ongoing skilled PT services in home health setting to continue to advance safe functional mobility, address ongoing impairments in R sided weakness, bed mobility, bed<>chair transfers, gait training, caregiver training, home safety, and minimize fall risk. ? ?Equipment: ?24x18 wheelchair. Family refusing hospital bed. ? ?Reasons for discharge: treatment goals met and discharge from hospital ? ?Patient/family agrees with progress made and goals achieved: Yes ? ?PT Discharge ?Precautions/Restrictions ?Precautions ?Precautions: Fall ?Precaution Comments: R hemi (RUE > RLE), incontinent, anxiety, aphasia ?Restrictions ?Weight Bearing Restrictions: No ?Vital Signs ?Therapy Vitals ?Temp: 98.6 ?F (37 ?C) ?Temp Source: Oral ?Pulse Rate: 62 ?Resp: 19 ?BP: (!)  154/62 ?Patient Position (if appropriate): Lying ?Oxygen Therapy ?SpO2: 95 % ?O2 Device: Room Air ?Pain ?Pain Assessment ?Pain Scale: 0-10 ?Pain Score: 0-No pain ?Pain Interference ?Pain Interference ?Pain Effect on Sleep: 0. Does not apply - I have not had any pain or hurting in the past 5 days ?Pain Interference with Therapy Activities: 2. Occasionally ?Pain Interference with Day-to-Day Activities: 2. Occasionally ?Vision/Perception  ?Vision - History ?Ability to See in Adequate Light: 0 Adequate ?Perception ?Perception: Within Functional Limits ?Praxis ?Praxis: Intact  ?Cognition ?Overall Cognitive Status: Impaired/Different from baseline ?Arousal/Alertness: Lethargic ?Orientation Level: Oriented X4 ?Attention: Selective;Alternating ?Focused Attention: Impaired ?Focused Attention Impairment: Verbal basic;Functional basic ?Sustained Attention: Impaired ?Sustained Attention Impairment: Verbal basic;Functional basic ?Selective Attention: Impaired ?Selective Attention Impairment: Verbal basic;Functional basic ?Memory: Impaired ?Memory Impairment: Retrieval deficit;Decreased short term memory ?Decreased Short Term Memory: Verbal basic;Functional basic ?Awareness: Impaired ?Awareness Impairment: Emergent impairment ?Problem Solving: Impaired ?Problem Solving Impairment: Functional basic;Verbal basic ?Executive Function: Initiating;Organizing;Self Correcting;Decision Making ?Sequencing: Impaired ?Sequencing Impairment: Verbal basic;Functional basic ?Organizing: Impaired ?Organizing Impairment: Verbal basic;Functional basic ?Decision Making: Impaired ?Decision Making Impairment: Functional basic;Verbal basic ?Initiating: Impaired ?Initiating Impairment: Functional basic;Verbal basic ?Self Correcting: Impaired ?Self Correcting Impairment: Functional basic;Verbal basic ?Behaviors: Poor frustration tolerance ?Safety/Judgment: Impaired ?Sensation ?Sensation ?Light Touch: Appears Intact ?Hot/Cold: Appears  Intact ?Proprioception: Impaired Detail ?Proprioception Impaired Details: Absent RUE;Absent RLE ?Stereognosis: Impaired by gross assessment ?Coordination ?Gross Motor Movements are Fluid and Coordinated: No ?Fine Motor Movements are Fluid and Coordinated: No ?Coordination and Movement Description: impacted by global deconditioning and poor activity tolerance, anxiety regarding functional mobility, and R sided (RUE > RLE) weakness ?Finger Nose Finger Test: UTA RUE; WNL LUE ?Heel Shin Test: UTA RLE due to weakness ?Motor  ?Motor ?Motor: Hemiplegia ?Motor - Skilled Clinical Observations: Right sided  ?Mobility ?Bed Mobility ?Bed  Mobility: Rolling Right;Rolling Left;Right Sidelying to Sit;Sit to Supine ?Rolling Right: Moderate Assistance - Patient 50-74% ?Rolling Left: Total Assistance - Patient < 25% ?Right Sidelying to Sit: Maximal Assistance - Patient 25-49%;Moderate Assistance - Patient 50-74% ?Sit to Supine: Maximal Assistance - Patient 25-49% ?Transfers ?Transfers: Stand to Sit;Sit to Stand;Squat Pivot Transfers;Stand Pivot Transfers ?Sit to Stand: Moderate Assistance - Patient 50-74% ?Stand to Sit: Moderate Assistance - Patient 50-74% ?Stand Pivot Transfers: Moderate Assistance - Patient 50 - 74% ?Stand Pivot Transfer Details: Verbal cues for sequencing;Verbal cues for safe use of DME/AE;Verbal cues for gait pattern;Verbal cues for technique;Verbal cues for precautions/safety;Tactile cues for posture;Tactile cues for weight shifting;Tactile cues for placement;Tactile cues for initiation;Manual facilitation for weight shifting;Manual facilitation for placement;Manual facilitation for weight bearing ?Squat Pivot Transfers: Moderate Assistance - Patient 50-74% ?Transfer (Assistive device): Standard walker ?Locomotion  ?Gait ?Ambulation: Yes ?Gait Assistance: Moderate Assistance - Patient 50-74%;2 Helpers ?Gait Distance (Feet): 25 Feet ?Assistive device: Rolling walker ?Gait Assistance Details: Verbal cues for gait  pattern;Verbal cues for safe use of DME/AE;Verbal cues for technique;Verbal cues for precautions/safety;Tactile cues for weight shifting;Tactile cues for sequencing;Tactile cues for initiation;Tactile cues for posture;Tactile cues for weight beaing;Verbal cues for sequencing ?Gait ?Gait: Yes ?Gait Pattern: Impaired ?Gait Pattern: Decreased step length - right;Decreased stance time - right;Decreased hip/knee flexion - right;Decreased dorsiflexion - right;Trunk flexed;Poor foot clearance - right;Narrow base of support ?Gait velocity: decreased ?Stairs / Additional Locomotion ?Stairs: No ?Wheelchair Mobility ?Wheelchair Mobility: No (24inch wheelchair. Unable to reach LLE to ground to assist wtih hemi technique. Will require caregiver transport for w/c mobility)  ?Trunk/Postural Assessment  ?Cervical Assessment ?Cervical Assessment: Exceptions to [facility] (forward head) ?Thoracic Assessment ?Thoracic Assessment: Exceptions to [facility] (rounded shoulders) ?Lumbar Assessment ?Lumbar Assessment: Exceptions to [facility] (posterior pelvic tilt) ?Postural Control ?Postural Control: Deficits on evaluation ?Righting Reactions: delayed and inadequate  ?Balance ?Balance ?Balance Assessed: Yes ?Static Sitting Balance ?Static Sitting - Balance Support: Feet supported;Left upper extremity supported ?Static Sitting - Level of Assistance: 5: Stand by assistance ?Dynamic Sitting Balance ?Dynamic Sitting - Balance Support: Right upper extremity supported;During functional activity;Feet supported ?Dynamic Sitting - Level of Assistance: 4: Min assist ?Static Standing Balance ?Static Standing - Balance Support: Left upper extremity supported ?Static Standing - Level of Assistance: 4: Min assist;5: Stand by assistance ?Dynamic Standing Balance ?Dynamic Standing - Balance Support: During functional activity;Left upper extremity supported ?Dynamic Standing - Level of Assistance: 3: Mod assist ?Extremity Assessment  ?RUE Assessment ?RUE Assessment:  Exceptions to [facility] ?General Strength Comments: trace Proximally; 0/5 distally ?LUE Assessment ?LUE Assessment: Exceptions to [facility] ?General Strength Comments: 3+/5 ?RLE Assessment ?RLE Assessment: Exceptions to [facility] ?RLE Strength ?RLE Ov

## 2022-02-12 NOTE — Progress Notes (Signed)
Inpatient Rehabilitation Discharge Medication Review by a Pharmacist ? ?A complete drug regimen review was completed for this patient to identify any potential clinically significant medication issues. ? ?High Risk Drug Classes Is patient taking? Indication by Medication  ?Antipsychotic No   ?Anticoagulant Yes Apixaban for LLE DVT 01/22/22  ?Antibiotic No   ?Opioid No   ?Antiplatelet Yes Aspirin and clopidogrel for stroke prophylaxis   ?Hypoglycemics/insulin Yes Insulin for diabetes  ?Vasoactive Medication Yes Amlodipine for HTN   ?Chemotherapy No   ?Other Yes Baclofen for muscle spasms and sleep ?Modafinil for alertness ?Escitalopram for depression   ? ? ? ?Type of Medication Issue Identified Description of Issue Recommendation(s)  ?Drug Interaction(s) (clinically significant) ?    ?Duplicate Therapy ? Per Neuro 3/20, plan DAPT x3 months  (started 3/18) then asa 325mg  alone. Apixaban was added on 3/27 for LLE DVT. Discussed with Neuro and CIR, aspirin and clopidogrel discontinued. Will continue apixaban   ?Allergy ?    ?No Medication Administration End Date ?    ?Incorrect Dose ?    ?Additional Drug Therapy Needed ?    ?Significant med changes from prior encounter (inform family/care partners about these prior to discharge). Increased atorvastatin dose  ?New acetaminophen, apixaban, baclofen, escitalopram, insulin glargine, melatonin, metoprolol tartrate, modafinil, vitamin D  RN to educate patient/family on insulin administration ?Educate patient/family on all med changes   ?Other ?    ? ? ?Clinically significant medication issues were identified that warrant physician communication and completion of prescribed/recommended actions by midnight of the next day:  Yes and has been addressed per documentation above  ? ?Name of provider notified for urgent issues identified: Marlowe Shores, [place]; Dr. Rosalin Hawking ? ?Provider Method of Notification: Secure chat  ? ? ?  ?Time spent performing this drug regimen review (minutes):   30 ? ?Benetta Spar, PharmD, BCPS, BCCP ?Clinical Pharmacist ? ?Please check AMION for all [facility] phone numbers ?After 10:00 PM, call [place] [phone] ? ?

## 2022-02-12 NOTE — Progress Notes (Signed)
?                                                       PROGRESS NOTE ? ? ?Subjective/Complaints: ?She is eager to go home ?Discussed that Aspirin and Plavix were discontinued to decrease bleeding risk while she is on Eliqis ?Looks like modafinil may need prior auth- will d/c since may be expensive for patient outpatient  ? ?ROS:  Denies CP, SOB, N/V/D ? ?Objective: ?  ?No results found. ?No results for input(s): WBC, HGB, HCT, PLT in the last 72 hours. ? ? ? ?No results for input(s): NA, K, CL, CO2, GLUCOSE, BUN, CREATININE, CALCIUM in the last 72 hours. ? ? ?Intake/Output Summary (Last 24 hours) at 02/12/2022 1110 ?Last data filed at 02/12/2022 0815 ?Gross per 24 hour  ?Intake 600 ml  ?Output --  ?Net 600 ml  ?  ? ?  ? ?Physical Exam: ?Vital Signs ?Blood pressure (!) 142/58, pulse 68, temperature 98.4 ?F (36.9 ?C), temperature source Oral, resp. rate 17, height 5' (1.524 m), weight 125.4 kg, SpO2 94 %. ?Gen: NAD, normal appearing, BMI 53.99 ?HEENT: oral mucosa pink and moist, NCAT ?Cardio: Reg rate ?Chest: normal effort, normal rate of breathing ?Abd: soft, non-distended ?Ext: no edema ?Psych: pleasant, slightly flat ?Skin: intact ?Musculoskeletal:  ?No tenderness in extremities ?Psych: pleasant and cooperative  ?Neurological:  ?Alert ?RUE 0/5, RLE 1-2/5, Left side 5/5.  ?Sensation intact to light touch. ? ?Assessment/Plan: ?1. Functional deficits which require 3+ hours per day of interdisciplinary therapy in a comprehensive inpatient rehab setting. ?Physiatrist is providing close team supervision and 24 hour management of active medical problems listed below. ?Physiatrist and rehab team continue to assess barriers to discharge/monitor patient progress toward functional and medical goals ? ?Care Tool: ? ?Bathing ?   ?Body parts bathed by patient: Face, Left upper leg, Chest, Right upper leg  ? Body parts bathed by helper: Right arm, Left arm, Front perineal area, Abdomen, Buttocks, Right lower leg, Left lower leg ?   ?  ?Bathing assist Assist Level: Maximal Assistance - Patient 24 - 49% ?  ?  ?Upper Body Dressing/Undressing ?Upper body dressing   ?What is the patient wearing?: Bra, Pull over shirt ?   ?Upper body assist Assist Level: Maximal Assistance - Patient 25 - 49% ?   ?Lower Body Dressing/Undressing ?Lower body dressing ? ? ?   ?What is the patient wearing?: Pants, Incontinence brief ? ?  ? ?Lower body assist Assist for lower body dressing: Maximal Assistance - Patient 25 - 49% ?   ? ?Toileting ?Toileting    ?Toileting assist Assist for toileting: Maximal Assistance - Patient 25 - 49% ?  ?  ?Transfers ?Chair/bed transfer ? ?Transfers assist ? Chair/bed transfer activity did not occur: Safety/medical concerns ? ?Chair/bed transfer assist level: Moderate Assistance - Patient 50 - 74% ?  ?  ?Locomotion ?Ambulation ? ? ?Ambulation assist ? ? Ambulation activity did not occur: Safety/medical concerns ? ?  ?  ?   ? ?Walk 10 feet activity ? ? ?Assist ? Walk 10 feet activity did not occur: Safety/medical concerns ? ?  ?   ? ?Walk 50 feet activity ? ? ?Assist Walk 50 feet with 2 turns activity did not occur: Safety/medical concerns ? ?  ?   ? ? ?Walk 150 feet  activity ? ? ?Assist Walk 150 feet activity did not occur: Safety/medical concerns ? ?  ?  ?  ? ?Walk 10 feet on uneven surface  ?activity ? ? ?Assist Walk 10 feet on uneven surfaces activity did not occur: Safety/medical concerns ? ? ?  ?   ? ?Wheelchair ? ? ? ? ?Assist Is the patient using a wheelchair?: Yes ?Type of Wheelchair: Manual ?  ? ?Wheelchair assist level: Dependent - Patient 0% ?Max wheelchair distance: 0  ? ? ?Wheelchair 50 feet with 2 turns activity ? ? ? ?Assist ? ?  ?  ? ? ?Assist Level: Dependent - Patient 0%  ? ?Wheelchair 150 feet activity  ? ? ? ?Assist ?   ? ? ?Assist Level: Dependent - Patient 0%  ? ?Blood pressure (!) 142/58, pulse 68, temperature 98.4 ?F (36.9 ?C), temperature source Oral, resp. rate 17, height 5' (1.524 m), weight 125.4 kg, SpO2 94  %. ? ?Medical Problem List and Plan: ?1. 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 Continue CIR ?2.  Left lower extremity DVTs: Continue Eliquis until 04/30/21.  Discussed cost with patient and wife. D/c aspirin and Plavix.  ?3. Pain Management: N/A ?4. Depression: Continue Lexapro to 10mg . Provide emotional support ?            -antipsychotic agents: N/A ?5. Neuropsych: This patient is capable of making decisions on her own behalf. ?6. Diarrhea related perianal irritation, order Tucks  ?7. Fluids/Electrolytes/Nutrition:   ? -appears to have good appetite, eating  100% ?8.  Diabetes mellitus with hyperglycemia.  Hemoglobin A1c 13.1.  Insulin therapy as directed with diabetic teaching. Educated regarding avoiding foods with added sugar.  ? CBG (last 3)  ?Recent Labs  ?  02/11/22 ?1625 02/11/22 ?2123 02/12/22 ?0607  ?GLUCAP 93 149* 75  ? ?D/c metformin given AKI ? Continue insulin 35U ? Relatively controlled on 4/15 ? ?9.  Hypertension. Metoprolol 25mg  BID Continue Norvasc 10 mg daily.  Monitor with increased mobility. D/c magnesium in case contributing to abdominal cramping ?Vitals:  ? 02/12/22 0529 02/12/22 0932  ?BP: (!) 156/60 (!) 142/58  ?Pulse: 63 68  ?Resp: 18 17  ?Temp: 98.7 ?F (37.1 ?C) 98.4 ?F (36.9 ?C)  ?SpO2: 95% 94%  ? Slightly labile on 4/15, monitor for trend ?10. Hyperlipidemia.  LDL reviewed and is 115. Continue Lipitor ?11. Morbid obesity.  BMI greater than 56. Continue heart healthy/carb modified diet ?12. Vitamin D deficiency: start ergocalciferol 50,000U once per week for 7 weeks ?13. Insomnia: resolved with melatonin 3mg  HS, will change to prn. Placed sign on door not to wake before 5am.  ?14. AKI: nursing order to encourage 6-8 glasses of water per day, start IVF HS for 10 hours nightly ?Cr 1.26 on 4/14, cont to  monitor  ?15. Rash on posterior legs: hypoallergenic sheets and hydrocortisone cream ordered, resolved, d/c hydrocortisone  ?16. Tachycardia: continue lopressor 12.5mg  BID  ?17. UTI: >100,000 E coli: one time IV gentamycin ?18. Daytime fatigue: started modafinil 100mg  daily, will d/c since may be expensive for patient outpatient.  ? ?LOS: ?26 days ?A FACE TO FACE EVALUATION WAS PERFORMED ? ?5/15 P Gergory Biello ?02/12/2022, 11:10 AM  ? ?  ?

## 2022-02-12 NOTE — Progress Notes (Signed)
Physical Therapy Session Note ? ?Patient Details  ?Name: Samantha [name] ?MRN: [id number] ?Date of Birth: Dec 14, 1955 ? ?Today's Date: 02/12/2022 ?PT Individual Time: 4715-9539  ?PT Individual Time Calculation (min): 59 min  ? ?Short Term Goals: ?Week 4:  PT Short Term Goal 1 (Week 4): STG = LTG due to ELOS ? ?Skilled Therapeutic Interventions/Progress Updates:  ?   ?1st session: ?Pt supine in bed to start - flat affect and limited engagement. Word finding difficulties present and sometimes difficult to comprehend. Noted soiled brief (incontinent of bladder) and offered assistance in getting changed and cleaned. Pt lacking awareness of incontinence. Pt refusing and requesting female staff to assist. Female NT notified of request who came to assist with cleaning and brief change. Pt becoming quite emotional and very upset - she began to refuse PT, stating she's going home today (DC date is tomorrow). Required ++ time for calming and redirection, ultimately able to encourage her to mobilize OOB to chair when offered to take her outside. Donned pants and shoes with totalA +2 for time management. She required minA for rolling R and maxA for rolling L with hopsital bed features. Supine<>sitting EOB with modA for trunk support and cues for log rolling technique. Able to sit EOB with SBA and completed squat<>pivot transfer with modA from EOB to w.c, towards her stronger L side. Pt wheeled sinkside, per her request, to comb her hair - required assist for the back of her head. Pt transported outdoors in her w/c and while outdoors, we discussed DC Plan, home safety, fall prevention, etc. Pt appreciative of having opportunity to spend time outdoors. Returned to her room, remained seated in w/c, trough supporting RUE, safety belt alarm on, all needs met. ? ?Therapy Documentation ?Precautions:  ?Precautions ?Precautions: Fall ?Precaution Comments: R hemi (RUE > RLE) ?Restrictions ?Weight Bearing Restrictions:  No ?General: ? ? ?Therapy/Group: Individual Therapy ? ?Zahi Plaskett P Avanelle Pixley ?02/12/2022, 7:38 AM  ?

## 2022-02-12 NOTE — TOC Benefit Eligibility Note (Signed)
Patient Advocate Encounter ? ?Received notification that the request for prior authorization for Modafinil (Provigil) 100 mg tablets has been denied due to do not meet the requirements of your plan.   ?  ? ?Roland Earl, CPhT ?Pharmacy Patient Advocate Specialist ?[facility] Patient Advocate Team ?Direct Number: [phone]  Fax: [phone]  ?

## 2022-02-12 NOTE — Progress Notes (Signed)
Speech Language Pathology Discharge Summary ? ?Patient Details  ?Name: Samantha [name] ?MRN: [id number] ?Date of Birth: [birth year] ? ?Today's Date: 02/12/2022 ?SLP Individual Time: 0415-9301 ?SLP Individual Time Calculation (min): 45 min ? ? ?Skilled Therapeutic Interventions:  Pt seen this date for skilled ST intervention targeting cognitive-linguistic goals. Pt encountered awake/alert and OOB in w/c. Flat affect. Apathetic to intervention. SLP facilitated today's session by providing verbal education re: principles of neuroplasticity when pt became tearful re: her current functioning s/p CVA. Pt recalled 2 out of 3 principles following delay with distractions given Min verbal A. Completed word search to target attention and organization with Min-Mod verbal and visual A. Completed divergent naming task with Min verbal A. Oriented to date with Mod I. Reports feeling better following education re: neuroplasticity. Reinforced need for 24 hour supervision and assistance at time of discharge. Called pt's spouse to reinforce as well. They verbalized understanding. Session concluded with pt in w/c, safety belt donned, call bell within reach, and all immediate needs met. Will plan to d/c this date given pt is to discharge from [facility] on 02/13/2022. ? ?Patient has met 2 of 4 long term goals.  Patient to discharge at overall Mod level.  ?Reasons goals not met: Decreased motivation, severity of deficits  ? ?Clinical Impression/Discharge Summary: Pt has demonstrated minimal progress during CIR admission. She continues to present with mild dysarthria and moderate-severe cognitive-linguistic impairment s/p CVA. Barriers to progress have included motivation, severity of deficits, and affect. Pt and family education completed. Recommend OP or HH ST intervention post-discharge, as well as 24-hour/full supervision and assistance from family for completion of all iADL tasks (cooking, cleaning, medication + financial management, driving).  Additional intervention will maximize pt's independence and decreased caregiver burden. ? ?Care Partner:  ?Caregiver Able to Provide Assistance: Yes  ?Type of Caregiver Assistance: Cognitive;Physical ? ?Recommendation:  ?24 hour supervision/assistance;Home Health SLP;Outpatient SLP  ?Rationale for SLP Follow Up: Maximize cognitive function and independence;Reduce caregiver burden  ? ?Equipment: N/A  ? ?Reasons for discharge: Discharged from hospital  ? ?Patient/Family Agrees with Progress Made and Goals Achieved: Yes  ? ? ?Vineet Kinney A Kiera Hussey ?02/12/2022, 3:28 PM ? ?

## 2022-02-13 ENCOUNTER — Other Ambulatory Visit (HOSPITAL_COMMUNITY): Payer: Self-pay

## 2022-02-13 LAB — GLUCOSE, CAPILLARY
Glucose-Capillary: 124 mg/dL — ABNORMAL HIGH (ref 70–99)
Glucose-Capillary: 128 mg/dL — ABNORMAL HIGH (ref 70–99)

## 2022-02-13 MED ORDER — BLOOD GLUCOSE MONITOR SYSTEM W/DEVICE KIT
PACK | 0 refills | Status: DC
  Filled 2022-02-13: qty 1, 30d supply, fill #0

## 2022-02-13 MED ORDER — BLOOD GLUCOSE METER KIT: PACK | 0 refills | Status: DC

## 2022-02-13 MED ORDER — ACCU-CHEK SOFTCLIX LANCETS MISC
0 refills | Status: AC
  Filled 2022-02-13: qty 100, 25d supply, fill #0

## 2022-02-13 MED ORDER — ACCU-CHEK GUIDE VI STRP
ORAL_STRIP | 0 refills | Status: AC
  Filled 2022-02-13: qty 100, 25d supply, fill #0

## 2022-02-13 MED ORDER — BLOOD GLUCOSE METER KIT
PACK | 0 refills | Status: DC
Start: 2022-02-13 — End: 2022-08-13

## 2022-02-13 NOTE — Progress Notes (Addendum)
Educated patient and Husband on accu checks/demonstration provided.  ?Mcarthur Rossetti Angiulli, PA-C ?reviewed medications and provided discharge instructions. Staff x2 assisted patient in private vehicle. Patient discharged. ? ? ?Tilden Dome, LPN ?

## 2022-02-13 NOTE — Progress Notes (Signed)
?                                                       PROGRESS NOTE ? ? ?Subjective/Complaints: ?D/c home today ?No new complains ?Husband on way ?Systolic elevated, diastolic soft ? ?ROS:  Denies CP, SOB, N/V/D ? ?Objective: ?  ?No results found. ?No results for input(s): WBC, HGB, HCT, PLT in the last 72 hours. ? ? ? ?No results for input(s): NA, K, CL, CO2, GLUCOSE, BUN, CREATININE, CALCIUM in the last 72 hours. ? ? ?Intake/Output Summary (Last 24 hours) at 02/13/2022 0955 ?Last data filed at 02/13/2022 0700 ?Gross per 24 hour  ?Intake 238 ml  ?Output --  ?Net 238 ml  ?  ? ?  ? ?Physical Exam: ?Vital Signs ?Blood pressure (!) 140/52, pulse 67, temperature 98.6 ?F (37 ?C), temperature source Oral, resp. rate 18, height 5' (1.524 m), weight 125.4 kg, SpO2 94 %. ?Gen: NAD, normal appearing, BMI 53.99 ?HEENT: oral mucosa pink and moist, NCAT ?Cardio: Reg rate ?Chest: normal effort, normal rate of breathing ?Abd: soft, non-distended ?Ext: no edema ?Psych: pleasant, slightly flat ?Skin: intact ?Musculoskeletal:  ?No tenderness in extremities ?Psych: pleasant and cooperative  ?Neurological:  ?Alert ?RUE 0/5, RLE 1-2/5, Left side 5/5.  ?Sensation intact to light touch. ? ?Assessment/Plan: ?1. Functional deficits which require 3+ hours per day of interdisciplinary therapy in a comprehensive inpatient rehab setting. ?Physiatrist is providing close team supervision and 24 hour management of active medical problems listed below. ?Physiatrist and rehab team continue to assess barriers to discharge/monitor patient progress toward functional and medical goals ? ?Care Tool: ? ?Bathing ?   ?Body parts bathed by patient: Face, Left upper leg, Chest, Right upper leg  ? Body parts bathed by helper: Right arm, Left arm, Front perineal area, Abdomen, Buttocks, Right lower leg, Left lower leg ?  ?  ?Bathing assist Assist Level: Moderate Assistance - Patient 50 - 74% ?  ?  ?Upper Body Dressing/Undressing ?Upper body dressing   ?What is  the patient wearing?: Bra, Pull over shirt ?   ?Upper body assist Assist Level: Minimal Assistance - Patient > 75% ?   ?Lower Body Dressing/Undressing ?Lower body dressing ? ? ?   ?What is the patient wearing?: Pants, Incontinence brief ? ?  ? ?Lower body assist Assist for lower body dressing: Moderate Assistance - Patient 50 - 74% ?   ? ?Toileting ?Toileting    ?Toileting assist Assist for toileting: Total Assistance - Patient < 25% ?  ?  ?Transfers ?Chair/bed transfer ? ?Transfers assist ? Chair/bed transfer activity did not occur: Safety/medical concerns ? ?Chair/bed transfer assist level: Moderate Assistance - Patient 50 - 74% ?  ?  ?Locomotion ?Ambulation ? ? ?Ambulation assist ? ? Ambulation activity did not occur: Safety/medical concerns ? ?Assist level: 2 helpers ?Assistive device: Walker-rolling ?Max distance: 83ft  ? ?Walk 10 feet activity ? ? ?Assist ? Walk 10 feet activity did not occur: Safety/medical concerns ? ?Assist level: 2 helpers ?Assistive device: Walker-rolling  ? ?Walk 50 feet activity ? ? ?Assist Walk 50 feet with 2 turns activity did not occur: Safety/medical concerns ? ?  ?   ? ? ?Walk 150 feet activity ? ? ?Assist Walk 150 feet activity did not occur: Safety/medical concerns ? ?  ?  ?  ? ?  Walk 10 feet on uneven surface  ?activity ? ? ?Assist Walk 10 feet on uneven surfaces activity did not occur: Safety/medical concerns ? ? ?  ?   ? ?Wheelchair ? ? ? ? ?Assist Is the patient using a wheelchair?: Yes ?Type of Wheelchair: Manual ?  ? ?Wheelchair assist level: Dependent - Patient 0% ?Max wheelchair distance: 0  ? ? ?Wheelchair 50 feet with 2 turns activity ? ? ? ?Assist ? ?  ?  ? ? ?Assist Level: Dependent - Patient 0%  ? ?Wheelchair 150 feet activity  ? ? ? ?Assist ?   ? ? ?Assist Level: Dependent - Patient 0%  ? ?Blood pressure (!) 140/52, pulse 67, temperature 98.6 ?F (37 ?C), temperature source Oral, resp. rate 18, height 5' (1.524 m), weight 125.4 kg, SpO2 94 %. ? ?Medical Problem List  and Plan: ?1. 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 Continue CIR ?2.  Left lower extremity DVTs: Continue Eliquis until 04/30/21.  Discussed cost with patient and wife. D/c aspirin and Plavix.  ?3. Pain Management: N/A ?4. Depression: Continue Lexapro to 10mg . Provide emotional support ?            -antipsychotic agents: N/A ?5. Neuropsych: This patient is capable of making decisions on her own behalf. ?6. Diarrhea related perianal irritation, order Tucks  ?7. Fluids/Electrolytes/Nutrition:   ? -appears to have good appetite, eating  100% ?8.  Diabetes mellitus with hyperglycemia.  Hemoglobin A1c 13.1.  Insulin therapy as directed with diabetic teaching. Educated regarding avoiding foods with added sugar.  ? CBG (last 3)  ?Recent Labs  ?  02/12/22 ?1707 02/12/22 ?2144 02/13/22 ?0633  ?GLUCAP 117* 126* 124*  ? ?D/c metformin given AKI ? Continue insulin 35U ? ?9.  Hypertension. Metoprolol 25mg  BID Continue Norvasc 10 mg daily.  Monitor with increased mobility. D/c magnesium in case contributing to abdominal cramping ?Vitals:  ? 02/12/22 1452 02/12/22 1930  ?BP: (!) 154/62 (!) 140/52  ?Pulse: 62 67  ?Resp: 19 18  ?Temp: 98.6 ?F (37 ?C) 98.6 ?F (37 ?C)  ?SpO2: 95% 94%  ? Slightly labile on 4/15, monitor for trend ?10. Hyperlipidemia.  LDL reviewed and is 115. Continue Lipitor ?11. Morbid obesity.  BMI greater than 56. Continue heart healthy/carb modified diet ?12. Vitamin D deficiency: start ergocalciferol 50,000U once per week for 7 weeks ?13. Insomnia: resolved, continue melatonin 3mg  HS, will change to prn. Placed sign on door not to wake before 5am.  ?14. AKI: nursing order to encourage 6-8 glasses of water per day, start IVF HS for 10 hours nightly ?Cr 1.26 on 4/14, cont to monitor  ?15. Rash on posterior legs: hypoallergenic  sheets and hydrocortisone cream ordered, resolved, d/c hydrocortisone  ?16. Tachycardia: continue lopressor 12.5mg  BID  ?17. UTI: >100,000 E coli: one time IV gentamycin ?18. Daytime fatigue: started modafinil 100mg  daily, will d/c since may be expensive for patient outpatient.  ? ? >30 minutes spent in discharge of patient including review of medications and follow-up appointments, physical examination, and in answering all patient's questions  ? ?LOS: ?27 days ?A FACE TO FACE EVALUATION WAS PERFORMED ? ?5/15 P Samantha [name] ?02/13/2022, 9:55 AM  ? ?  ?

## 2022-02-15 ENCOUNTER — Telehealth: Payer: Self-pay

## 2022-02-15 NOTE — Telephone Encounter (Signed)
Copied from [place] [phone]. Topic: Appointment Scheduling - Scheduling Inquiry for Clinic ?>> Feb 14, 2022  3:39 PM Erick Blinks wrote: ?Reason for CRM: Pt's daughter called requesting to have upcoming hosp fu appt scheduled as a virtual instead of in office, pt cant walk. She is in a wheelchair, and has PT coming to the start services tomorrow. ? ?Best contact: [phone] [name] Enright) ?

## 2022-02-16 NOTE — Telephone Encounter (Signed)
Patient has appointment 02/21/2022. ?

## 2022-02-19 ENCOUNTER — Telehealth: Payer: Self-pay | Admitting: Physician Assistant

## 2022-02-19 NOTE — Telephone Encounter (Signed)
Former patient of Osvaldo Angst. She has an upcoming appointment with you. 02/21/2022 ?

## 2022-02-19 NOTE — Telephone Encounter (Signed)
Home Health Verbal Orders - Caller/Agency: [facility] ?Callback Number: [id number] vm can be left  ?Requesting PT ?Frequency: 1x a week for 2 weeks  ?And  ?2xs a week for 7 weeks  ?

## 2022-02-20 ENCOUNTER — Other Ambulatory Visit: Payer: Self-pay

## 2022-02-20 ENCOUNTER — Other Ambulatory Visit (HOSPITAL_BASED_OUTPATIENT_CLINIC_OR_DEPARTMENT_OTHER): Payer: Self-pay

## 2022-02-20 ENCOUNTER — Telehealth: Payer: Self-pay

## 2022-02-20 ENCOUNTER — Telehealth (HOSPITAL_BASED_OUTPATIENT_CLINIC_OR_DEPARTMENT_OTHER): Payer: Self-pay

## 2022-02-20 DIAGNOSIS — I639 Cerebral infarction, unspecified: Secondary | ICD-10-CM

## 2022-02-20 NOTE — Telephone Encounter (Signed)
Transitions of Care Pharmacy  ° °Call attempted for a pharmacy transitions of care follow-up. HIPAA appropriate voicemail was left with call back information provided.  ° °Call attempt #1. Will follow-up in 2-3 days.  °  °

## 2022-02-20 NOTE — Telephone Encounter (Signed)
Pt's caretaker called and cancelled appt because they cannot get the patient out of the house. She is not mobile.  ?

## 2022-02-21 ENCOUNTER — Telehealth: Payer: Self-pay

## 2022-02-21 ENCOUNTER — Other Ambulatory Visit (HOSPITAL_COMMUNITY): Payer: Self-pay

## 2022-02-21 ENCOUNTER — Telehealth (HOSPITAL_COMMUNITY): Payer: Self-pay

## 2022-02-21 ENCOUNTER — Inpatient Hospital Stay: Payer: Self-pay | Admitting: Physician Assistant

## 2022-02-21 NOTE — Telephone Encounter (Signed)
Pharmacy Transitions of Care Follow-up Telephone Call ? ?Date of discharge: 02/13/22  ?Discharge Diagnosis: CVA ? ?How have you been since you were released from the hospital? Patient was resting, so she could not talk, but did not have any questions or concerns about her medications. I gave her call back information incase she did have questions.  ? ?Medication changes made at discharge: ?START taking these medications ? ?START taking these medications  ?Accu-Chek Guide w/Device Kit ?Use to test blood sugars up to 4 times daily.  ?acetaminophen 325 MG tablet ?Commonly known as: TYLENOL ?Take 2 tablets (650 mg total) by mouth every 4 (four) hours as needed for mild pain (or temp > 37.5 C (99.5 F)).  ?Baclofen 5 MG Tabs ?Take 5 mg by mouth at bedtime.  ?Basaglar KwikPen 100 UNIT/ML ?Inject 34 Units into the skin daily.  ?* blood glucose meter kit and supplies ?Dispense based on patient and insurance preference. Use up to four times daily as directed. (FOR ICD-10 E10.9, E11.9).  ?* blood glucose meter kit and supplies ?Dispense based on patient and insurance preference. Use up to four times daily as directed. (FOR ICD-10 E10.9, E11.9).  ?Eliquis 5 MG Tabs tablet ?Generic drug: apixaban ?Continue Eliquis 5 mg by mouth twice daily through 04/30/2022 and stop  ?escitalopram 10 MG tablet ?Commonly known as: LEXAPRO ?Take 1 tablet (10 mg total) by mouth daily.  ?melatonin 3 MG Tabs tablet ?Take 1 tablet (3 mg total) by mouth at bedtime as needed.  ?metoprolol tartrate 50 MG tablet ?Commonly known as: LOPRESSOR ?Take 1 tablet (50 mg total) by mouth 2 (two) times daily.  ?Pentips 32G X 4 MM Misc ?Generic drug: Insulin Pen Needle ?Use to inject insulin up to 4 times daily.  ?Vitamin D (Ergocalciferol) 1.25 MG (50000 UNIT) Caps capsule ?Commonly known as: DRISDOL ?Take 1 capsule (50,000 Units total) by mouth every 7 (seven) days.  ? very important  * This list has 2 medication(s) that are the same as other medications prescribed  for you. Read the directions carefully, and ask your doctor or other care provider to review them with you.  ? ?CONTINUE taking these medications ? ?CONTINUE taking these medications  ?amLODipine 10 MG tablet ?Commonly known as: NORVASC ?Take 1 tablet (10 mg total) by mouth daily.  ?atorvastatin 80 MG tablet ?Commonly known as: LIPITOR ?Take 1 tablet (80 mg total) by mouth daily at 6 PM.  ? ?STOP taking these medications ? ?STOP taking these medications  ?aspirin 325 MG EC tablet  ?clopidogrel 75 MG tablet ?Commonly known as: PLAVIX  ?insulin aspart 100 UNIT/ML injection ?Commonly known as: novoLOG  ?insulin glargine-yfgn 100 UNIT/ML injection ?Commonly known as: SEMGLEE  ? ? ?Medication changes verified by the patient? no  ? ?Medication Review: ?APIXABAN (ELIQUIS)  ?Apixaban 5 mg BID   ?- Discussed importance of taking medication around the same time everyday  ?- Reviewed potential DDIs with patient  ?- Advised patient of medications to avoid (NSAIDs, ASA)  ?- Educated that Tylenol (acetaminophen) will be the preferred analgesic to prevent risk of bleeding  ?- Emphasized importance of monitoring for signs and symptoms of bleeding (abnormal bruising, prolonged bleeding, nose bleeds, bleeding from gums, discolored urine, black tarry stools)  ?- Advised patient to alert all providers of anticoagulation therapy prior to starting a new medication or having a procedure  ? ?Follow-up Appointments: ?Date Visit Type Length Department   ? 02/21/2022  2:00 PM HOSPITAL FU 20 min [facility] [[phone]]  ? ? ?  Final Patient Assessment: ?Patient was resting, so she could not talk, but did not have any questions or concerns about her medications. I gave her call back information incase she did have questions.  ? ?Darcus Austin, PharmD ?Clinical Pharmacist ?[facility] ?02/21/2022 4:45 PM ? ?

## 2022-02-21 NOTE — Telephone Encounter (Signed)
? ?  Telephone encounter was:  Successful.  ?02/21/2022 ?Name: Samantha [name] MRN: [id number] DOB: 28-Nov-1955 ? ?Samantha [name] is a 66 y.o. year old female who is a primary care patient of Maryella Shivers (Inactive) . The community resource team was consulted for assistance with Transportation Needs  ? ?Care guide performed the following interventions: CG advised to contact daughter regarding Transportation. Daughter advised pt needs transportation, just not at this time. CG Discussed resources to assist with Transportation . Daughter was advised of processes for transportation and advised we would need to contact pt's insurance company to inquire about transportation benefits. CG was unable to locate [employer]'s 2023 benefit card in chart as daughter confirmed that is what pt has. CG explained that daughter could call back when she get insurance card or contact insurance on her own to inquire (instructions sent in e-mail). CG advised if insurance does not have a transportation benefit that there are county vendors. CG sent [employer], [employer], and [employer] and applications to daughter by e-mail. CG advised if further assistance was needed to please let me know. As there are no current appointments scheduled at this time, CG will close referral as transportation information was provided verbally and by e-mail. ? ?Follow Up Plan:  No further follow up planned at this time. The patient has been provided with needed resources. ? ?Hessie Knows ?Care Guide, Embedded Care Coordination ?[facility]  Care management  ?[place], [place] [address]  ?Main Phone: [phone]  E-mail: [email or web address]  ?Website: [email or web address] ? ? ? ?

## 2022-02-22 ENCOUNTER — Telehealth: Payer: Self-pay | Admitting: Physician Assistant

## 2022-02-22 NOTE — Telephone Encounter (Signed)
Home Health Verbal Orders - Caller/Agency: Marchelle Folks / [facility]  ?Callback Number: [phone] ?Requesting Social Work ?Frequency: Pt doesn't have proper care givers in the home / they need a medical social work eval order / please advise / a VM can be left  ?

## 2022-02-23 ENCOUNTER — Telehealth: Payer: Self-pay | Admitting: Physician Assistant

## 2022-02-23 NOTE — Telephone Encounter (Signed)
[facility] faxed refill request for the following medications: ? ?Insulin Pen Needle 32G X 4 MM MISC  ? ?Please advise. ? ?

## 2022-02-26 ENCOUNTER — Other Ambulatory Visit: Payer: Self-pay

## 2022-02-26 ENCOUNTER — Other Ambulatory Visit: Payer: Self-pay | Admitting: Physician Assistant

## 2022-02-26 MED ORDER — INSULIN PEN NEEDLE 32G X 4 MM MISC: 30.0000 | Freq: Every day | 0 refills | Status: DC

## 2022-02-26 NOTE — Telephone Encounter (Signed)
Medication Refill - Medication: insulin glargine (LANTUS) 100 UNIT/ML Solostar Pen  ? ?Pt's daughter says she needs basaglar pen ? ?Has the patient contacted their pharmacy? Yes.   ?(Agent: If no, request that the patient contact the pharmacy for the refill. If patient does not wish to contact the pharmacy document the reason why and proceed with request.) ?(Agent: If yes, when and what did the pharmacy advise?) ? ?Preferred Pharmacy (with phone number or street name):  ?[id number] - [place] - [address]  ?[address] [place] [address]  ?Phone: [phone] Fax: [phone]  ? ?Has the patient been seen for an appointment in the last year OR does the patient have an upcoming appointment? Yes.   ? ?Agent: Please be advised that RX refills may take up to 3 business days. We ask that you follow-up with your pharmacy. ? ?

## 2022-02-27 MED ORDER — INSULIN GLARGINE 100 UNIT/ML SOLOSTAR PEN: 34.0000 [IU] | PEN_INJECTOR | Freq: Every day | SUBCUTANEOUS | 1 refills | Status: DC

## 2022-02-27 NOTE — Telephone Encounter (Signed)
Sending to different pharmacy ? ?Requested Prescriptions  ?Pending Prescriptions Disp Refills  ?? insulin glargine (LANTUS) 100 UNIT/ML Solostar Pen 15 mL 1  ?  Sig: Inject 34 Units into the skin daily.  ?  ? Endocrinology:  Diabetes - Insulins Failed - 02/26/2022  5:30 PM  ?  ?  Failed - HBA1C is between 0 and 7.9 and within 180 days  ?  Hgb A1c MFr Bld  ?Date Value Ref Range Status  ?01/14/2022 13.1 (H) 4.8 - 5.6 % Final  ?  Comment:  ?  (NOTE) ?Pre diabetes:          5.7%-6.4% ? ?Diabetes:              >6.4% ? ?Glycemic control for   <7.0% ?adults with diabetes ?  ?   ?  ?  Failed - Valid encounter within last 6 months  ?  Recent Outpatient Visits   ?      ? 8 months ago Type 2 diabetes mellitus without complication, without long-term current use of insulin (HCC)  ? [facility] Chrismon, Jodell Cipro, PA-C  ? 1 year ago Type 2 diabetes mellitus without complication, without long-term current use of insulin (HCC)  ? [facility] [place], [place], [place]  ? 1 year ago Type 2 diabetes mellitus without complication, without long-term current use of insulin (HCC)  ? [facility] [place], [place]  ? 2 years ago Hypertension, unspecified type  ? [facility], [place]  ? 2 years ago Type 2 diabetes mellitus without complication, without long-term current use of insulin (HCC)  ? [facility] [place], [place]  ?  ?  ?Future Appointments   ?        ? In 2 days Drubel, Lillia Abed, PA-C [facility], [facility]  ? In 2 months Zane Herald, Rudolpho Sevin, FNP [facility] [place] [facility], [facility]  ? In 5 months Raulkar, Drema Pry, MD [facility], CPR  ?  ? ?  ?  ?  ? ?

## 2022-02-28 ENCOUNTER — Telehealth: Payer: Self-pay | Admitting: Family Medicine

## 2022-02-28 NOTE — Telephone Encounter (Signed)
Copied from CRM [id number]. Topic: General - Other ?>> Feb 28, 2022 11:18 AM Gaetana Michaelis A wrote: ?Reason for CRM: Courtney with [facility] has called to request a change for the patient's new prescription  ? ?[facility] would like the verbal okay to switch patient's insulin glargine (LANTUS) 100 UNIT/ML Solostar Pen [[id number]] to Basaglar  ? ?Please contact further when possible ?

## 2022-02-28 NOTE — Telephone Encounter (Signed)
Pharmacist advised at [place]  ?

## 2022-03-01 ENCOUNTER — Telehealth (INDEPENDENT_AMBULATORY_CARE_PROVIDER_SITE_OTHER): Payer: Self-pay | Admitting: Physician Assistant

## 2022-03-01 ENCOUNTER — Encounter: Payer: Self-pay | Admitting: Physician Assistant

## 2022-03-01 DIAGNOSIS — E1169 Type 2 diabetes mellitus with other specified complication: Secondary | ICD-10-CM

## 2022-03-01 DIAGNOSIS — Z794 Long term (current) use of insulin: Secondary | ICD-10-CM

## 2022-03-01 DIAGNOSIS — I10 Essential (primary) hypertension: Secondary | ICD-10-CM

## 2022-03-01 DIAGNOSIS — E785 Hyperlipidemia, unspecified: Secondary | ICD-10-CM

## 2022-03-01 DIAGNOSIS — I63513 Cerebral infarction due to unspecified occlusion or stenosis of bilateral middle cerebral arteries: Secondary | ICD-10-CM

## 2022-03-01 MED ORDER — AMLODIPINE BESYLATE 10 MG PO TABS: 10.0000 mg | ORAL_TABLET | Freq: Every day | ORAL | 1 refills | Status: DC

## 2022-03-01 MED ORDER — BLOOD PRESSURE MONITORING DEVI: 0 refills | Status: AC

## 2022-03-01 MED ORDER — BASAGLAR KWIKPEN 100 UNIT/ML ~~LOC~~ SOPN: 34.0000 [IU] | PEN_INJECTOR | Freq: Every day | SUBCUTANEOUS | 3 refills | Status: DC

## 2022-03-01 MED ORDER — BACLOFEN 5 MG PO TABS: 5.0000 mg | ORAL_TABLET | Freq: Every day | ORAL | 2 refills | Status: DC

## 2022-03-01 MED ORDER — ATORVASTATIN CALCIUM 80 MG PO TABS: 80.0000 mg | ORAL_TABLET | Freq: Every day | ORAL | 1 refills | Status: DC

## 2022-03-01 MED ORDER — ESCITALOPRAM OXALATE 10 MG PO TABS: 10.0000 mg | ORAL_TABLET | Freq: Every day | ORAL | 1 refills | Status: DC

## 2022-03-01 MED ORDER — METOPROLOL TARTRATE 50 MG PO TABS: 50.0000 mg | ORAL_TABLET | Freq: Two times a day (BID) | ORAL | 1 refills | Status: DC

## 2022-03-01 NOTE — Progress Notes (Signed)
? ? ?MyChart Video Visit ? ? ? ?Virtual Visit via Video Note  ? ?This visit type was conducted due to national recommendations for restrictions regarding the COVID-19 Pandemic (e.g. social distancing) in an effort to limit this patient's exposure and mitigate transmission in our community. This patient is at least at moderate risk for complications without adequate follow up. This format is felt to be most appropriate for this patient at this time. Physical exam was limited by quality of the video and audio technology used for the visit.  ? ?Patient location: home ?Provider location: [facility] ? ?I discussed the limitations of evaluation and management by telemedicine and the availability of in person appointments. The patient expressed understanding and agreed to proceed. ? ?Patient: Samantha [name]   DOB: 01/10/1956   66 y.o. Female  MRN: [id number] ?Visit Date: 03/01/2022 ? ?Today's healthcare provider: [name], PA-C  ? ?Cc. Hospital follow up  ? ?Subjective  ?  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 ?She was discharged home after rehab with some moderate improvement in activity tolerance and extremity functional use.  She was referred to neurology and rehab but patient and family states they have not made any neurology appointments.  ? ?No concerns other than transportation in the home, ensuring they have enough of her medications, appropriate DME.  Patient currently has a walker but they would prefer a walker with a seat. ? ?Pt is present on video chat with her daughter Samantha [name].  ? ?Medications: ?Outpatient Medications Prior to Visit  ?Medication Sig  ? Accu-Chek Softclix Lancets lancets Use to test blood sugars 4 times daily.  ? acetaminophen (TYLENOL) 325 MG tablet Take 2 tablets (650 mg total) by mouth every 4 (four)  hours as needed for mild pain (or temp > 37.5 C (99.5 F)).  ? apixaban (ELIQUIS) 5 MG TABS tablet Continue Eliquis 5 mg by mouth twice daily through 04/30/2022 and stop  ? blood glucose meter kit and supplies Dispense based on patient and insurance preference. Use up to four times daily as directed. (FOR ICD-10 E10.9, E11.9).  ? blood glucose meter kit and supplies Dispense based on patient and insurance preference. Use up to four times daily as directed. (FOR ICD-10 E10.9, E11.9).  ? Blood Glucose Monitoring Suppl (BLOOD GLUCOSE MONITOR SYSTEM) w/Device KIT Use to test blood sugars up to 4 times daily.  ? glucose blood (ACCU-CHEK GUIDE) test strip Use to test blood sugars 4 times daily.  ? Insulin Pen Needle 32G X 4 MM MISC Use to inject insulin up to 4 times daily.  ? melatonin 3 MG TABS tablet Take 1 tablet (3 mg total) by mouth at bedtime as needed.  ? Vitamin D, Ergocalciferol, (DRISDOL) 1.25 MG (50000 UNIT) CAPS capsule Take 1 capsule (50,000 Units total) by mouth every 7 (seven) days.  ? [DISCONTINUED] amLODipine (NORVASC) 10 MG tablet Take 1 tablet (10 mg total) by mouth daily.  ? [DISCONTINUED] atorvastatin (LIPITOR) 80 MG tablet Take 1 tablet (80 mg total) by mouth daily at 6 PM.  ? [DISCONTINUED] Baclofen 5 MG TABS Take 5 mg by mouth at bedtime.  ? [DISCONTINUED] escitalopram (LEXAPRO) 10 MG tablet Take 1 tablet (10 mg total) by mouth daily.  ? [DISCONTINUED] insulin glargine (LANTUS) 100 UNIT/ML Solostar Pen Inject 34 Units into the skin daily.  ? [  DISCONTINUED] metoprolol tartrate (LOPRESSOR) 50 MG tablet Take 1 tablet (50 mg total) by mouth 2 (two) times daily.  ? ?No facility-administered medications prior to visit.  ? ? ?Review of Systems  ?Constitutional:  Negative for fatigue and fever.  ?Respiratory:  Negative for cough and shortness of breath.   ?Cardiovascular:  Negative for chest pain and leg swelling.  ?Gastrointestinal:  Negative for abdominal pain.  ?Musculoskeletal:  Positive for gait  problem.  ?Neurological:  Positive for weakness. Negative for dizziness and headaches.  ? ? ? ? Objective  ?  ?There were no vitals taken for this visit. ? ? ? ? Assessment & Plan  ?  ? ?Problem List Items Addressed This Visit   ? ?  ? Cardiovascular and Mediastinum  ? Hypertension - Primary  ?  Will order bp cuff to pharmacy so family can check pressures ? ?  ?  ? Relevant Medications  ? Blood Pressure Monitoring DEVI  ? metoprolol tartrate (LOPRESSOR) 50 MG tablet  ? atorvastatin (LIPITOR) 80 MG tablet  ? amLODipine (NORVASC) 10 MG tablet  ? Other Relevant Orders  ? AMB Referral to [place]  ? Ambulatory referral to Cardiology  ? CVA (cerebral vascular accident) [facility])  ?  Refer patient to neurology and cardiology. ?For now continued all discharge medications ?Home health, home PT home speech already in place.  Home OT?  ?Patient's family is working with Education officer, museum for transportation services. ?Referral placed to CCM for pharm services. ?Patient and family encouraged to attend appointments in person once transportation is smoothed out. ? ? ?  ?  ? Relevant Medications  ? metoprolol tartrate (LOPRESSOR) 50 MG tablet  ? escitalopram (LEXAPRO) 10 MG tablet  ? Baclofen 5 MG TABS  ? atorvastatin (LIPITOR) 80 MG tablet  ? amLODipine (NORVASC) 10 MG tablet  ? Other Relevant Orders  ? For home use only DME 4 wheeled rolling walker with seat ([id number])  ? AMB Referral to [place]  ? Ambulatory referral to Neurology  ? Ambulatory referral to Cardiology  ?  ? Endocrine  ? Type 2 diabetes mellitus with other specified complication ([place])  ?  Pt now managing with basaglar insulin 34 u daily ?Reports fasting sugars range 123-148.  ?Will loop in pharm for assistance in management ? ?  ?  ? Relevant Medications  ? metoprolol tartrate (LOPRESSOR) 50 MG tablet  ? atorvastatin (LIPITOR) 80 MG tablet  ? Insulin Glargine (BASAGLAR KWIKPEN) 100 UNIT/ML  ? Other Relevant Orders  ? AMB Referral to  [place]  ? Hyperlipidemia associated with type 2 diabetes mellitus ([place])  ? Relevant Medications  ? metoprolol tartrate (LOPRESSOR) 50 MG tablet  ? atorvastatin (LIPITOR) 80 MG tablet  ? amLODipine (NORVASC) 10 MG tablet  ? Insulin Glargine (BASAGLAR KWIKPEN) 100 UNIT/ML  ?  ? ?I discussed the assessment and treatment plan with the patient. The patient was provided an opportunity to ask questions and all were answered. The patient agreed with the plan and demonstrated an understanding of the instructions. ?  ?The patient was advised to call back or seek an in-person evaluation if the symptoms worsen or if the condition fails to improve as anticipated. ? ?I provided 20 minutes of non-face-to-face time during this encounter. ? ?I, [name], PA-C have reviewed all documentation for this visit. The documentation on  03/01/2022 for the exam, diagnosis, procedures, and orders are all accurate and complete. ? ?[name], PA-C ?[facility] [address] ?[place], [place], [address] ?Office: [phone] ?Fax: [phone]  ? ?[facility]  ? ?

## 2022-03-01 NOTE — Assessment & Plan Note (Addendum)
Refer patient to neurology and cardiology. ?For now continued all discharge medications ?Home health, home PT home speech already in place.  Home OT?  ?Patient's family is working with Education officer, museum for transportation services. ?Referral placed to CCM for pharm services. ?Patient and family encouraged to attend appointments in person once transportation is smoothed out. ? ?

## 2022-03-01 NOTE — Assessment & Plan Note (Signed)
Will order bp cuff to pharmacy so family can check pressures ?

## 2022-03-01 NOTE — Assessment & Plan Note (Signed)
Pt now managing with basaglar insulin 34 u daily ?Reports fasting sugars range 123-148.  ?Will loop in pharm for assistance in management ?

## 2022-03-05 ENCOUNTER — Telehealth: Payer: Self-pay | Admitting: *Deleted

## 2022-03-05 NOTE — Chronic Care Management (AMB) (Signed)
  Care Management   Outreach Note  03/05/2022 Name: Samantha [name] MRN: [id number] DOB: 05/01/1956  Referred by: [name], PA-C Reason for referral : Care Coordination (Initial outreach to schedule referral with Pharm D)   An unsuccessful telephone outreach was attempted today. The patient was referred to the case management team for assistance with care management and care coordination.   Follow Up Plan:  A HIPAA compliant phone message was left for the patient providing contact information and requesting a return call.   Burman Nieves, CCMA Care Guide, Embedded Care Coordination [facility]  Care Management  Direct Dial: [phone]

## 2022-03-06 ENCOUNTER — Other Ambulatory Visit (HOSPITAL_COMMUNITY): Payer: Self-pay

## 2022-03-08 ENCOUNTER — Telehealth: Payer: Self-pay | Admitting: Physician Assistant

## 2022-03-08 NOTE — Telephone Encounter (Signed)
Rosanne Ashing - home health services is calling in to request verbal orders for OT services.  ? ?Frequency: 1 week 6  ? [phone] ? ?Rosanne Ashing would also like to know if provider could write a Rx for the right sided platform or pt's walker and send it to:  ? ?[facility]  ?

## 2022-03-16 NOTE — Chronic Care Management (AMB) (Signed)
  Care Management   Note  03/16/2022 Name: Samantha [name] MRN: [id number] DOB: March 29, 1956  [name] is a 66 y.o. year old female who is a primary care patient of [name], [place]. I reached out to Yitzel Mapp by phone today offer care coordination services.   [name] was given information about care management services today including:  Care management services include personalized support from designated clinical staff supervised by her physician, including individualized plan of care and coordination with other care providers 24/7 contact phone numbers for assistance for urgent and routine care needs. The patient may stop care management services at any time by phone call to the office staff.  Patient agreed to services and verbal consent obtained.   Follow up plan: Telephone appointment with care management team member scheduled for: 04/06/2022  Burman Nieves, CCMA Care Guide, Embedded Care Coordination [facility]  Care Management  Direct Dial: [phone]

## 2022-03-21 ENCOUNTER — Telehealth: Payer: Self-pay

## 2022-03-21 NOTE — Telephone Encounter (Signed)
Copied from [place] [id number]. Topic: General - Other >> Mar 21, 2022 11:34 AM Pawlus, Brayton Layman A wrote: Reason for CRM: Milissa Hylton from [facility] wanted to let the office know that the pt had a Missed OT visit for home health this week, FYI

## 2022-03-27 ENCOUNTER — Ambulatory Visit: Payer: Self-pay | Admitting: *Deleted

## 2022-03-27 NOTE — Telephone Encounter (Signed)
Message from Aretta Nip sent at 03/27/2022  2:43 PM EDT  Summary: pt stroke victim and has a really bad cold with congestion, call daughter as concerned   Pt daughter's  Lynnea Ferrier is wanting a cb re her mother has cold in her chest, very deep like a whooping cough, lots of mucus, just had a stroke and so she is worried, she wants to know what she should do, can't get out for an appt, tested her for covid, negative, fu with nurse call pls [phone]          Reason for Disposition  [1] Continuous (nonstop) coughing interferes with work or school AND [2] no improvement using cough treatment per Care Advice  Answer Assessment - Initial Assessment Questions 1. ONSET: "When did the cough begin?"      Coughing up green mucus.  Started last [employer]. And Fri.   Giving her Tussin and cough medication.    No runny nose.  Throat a little sore. 2. SEVERITY: "How bad is the cough today?"      *No Answer* 3. SPUTUM: "Describe the color of your sputum" (none, dry cough; clear, white, yellow, green)     Green mucus.   4. HEMOPTYSIS: "Are you coughing up any blood?" If so ask: "How much?" (flecks, streaks, tablespoons, etc.)     *No Answer* 5. DIFFICULTY BREATHING: "Are you having difficulty breathing?" If Yes, ask: "How bad is it?" (e.g., mild, moderate, severe)    - MILD: No SOB at rest, mild SOB with walking, speaks normally in sentences, can lie down, no retractions, pulse < 100.    - MODERATE: SOB at rest, SOB with minimal exertion and prefers to sit, cannot lie down flat, speaks in phrases, mild retractions, audible wheezing, pulse 100-120.    - SEVERE: Very SOB at rest, speaks in single words, struggling to breathe, sitting hunched forward, retractions, pulse > 120      Chest congestion 6. FEVER: "Do you have a fever?" If Yes, ask: "What is your temperature, how was it measured, and when did it start?"     No 7. CARDIAC HISTORY: "Do you have any history of heart disease?" (e.g., heart attack,  congestive heart failure)      Recent stroke Covid negative 8. LUNG HISTORY: "Do you have any history of lung disease?"  (e.g., pulmonary embolus, asthma, emphysema)     N/A 9. PE RISK FACTORS: "Do you have a history of blood clots?" (or: recent major surgery, recent prolonged travel, bedridden)     N/A 10. OTHER SYMPTOMS: "Do you have any other symptoms?" (e.g., runny nose, wheezing, chest pain)       N/A 11. PREGNANCY: "Is there any chance you are pregnant?" "When was your last menstrual period?"       N/A 12. TRAVEL: "Have you traveled out of the country in the last month?" (e.g., travel history, exposures)       N/A  Protocols used: Cough - Acute Productive-A-AH

## 2022-03-27 NOTE — Telephone Encounter (Signed)
  Chief Complaint: Spoke with Zayah Keilman, daughter.   Mother has a deep cough and is coughing up green mucus.  Had a stroke in March so concerned. Symptoms: congestion in chest, deep congested cough with green mucus.   C/o throat being a little sore.  Covid negative. Frequency: Started last Thur. Or Fri. Pertinent Negatives: Patient denies fever Disposition: [] ED /[] Urgent Care (no appt availability in office) / [x] Appointment(In office/virtual)/ []  [place] [facility]/ [] Home Care/ [] Refused Recommended Disposition /[] [place] [facility]/ []  Follow-up with PCP Additional Notes: MyChart virtual visit made for 03/28/2022 at 3:00 with , PA-C.

## 2022-03-27 NOTE — Progress Notes (Unsigned)
I,Sha'taria Tyson,acting as a Education administrator for [employer], PA-C.,have documented all relevant documentation on the behalf of [name], PA-C,as directed by  [name], PA-C while in the presence of [name], PA-C.  MyChart Video Visit    Virtual Visit via Video Note   This visit type was conducted due to national recommendations for restrictions regarding the COVID-19 Pandemic (e.g. social distancing) in an effort to limit this patient's exposure and mitigate transmission in our community. This patient is at least at moderate risk for complications without adequate follow up. This format is felt to be most appropriate for this patient at this time. Physical exam was limited by quality of the video and audio technology used for the visit.   Patient location: HOME Provider location: [facility]  I discussed the limitations of evaluation and management by telemedicine and the availability of in person appointments. The patient expressed understanding and agreed to proceed.  Patient: Samantha [name]   DOB: May 05, 1956   66 y.o. Female  MRN: [id number] Visit Date: 03/28/2022  Today's healthcare provider: [name], PA-C   No chief complaint on file.  Subjective    Cough This is a new problem. The cough is Productive of sputum.   ***   Medications: Outpatient Medications Prior to Visit  Medication Sig  . Accu-Chek Softclix Lancets lancets Use to test blood sugars 4 times daily.  Marland Kitchen acetaminophen (TYLENOL) 325 MG tablet Take 2 tablets (650 mg total) by mouth every 4 (four) hours as needed for mild pain (or temp > 37.5 C (99.5 F)).  Marland Kitchen amLODipine (NORVASC) 10 MG tablet Take 1 tablet (10 mg total) by mouth daily.  Marland Kitchen apixaban (ELIQUIS) 5 MG TABS tablet Continue Eliquis 5 mg by mouth twice daily through 04/30/2022 and stop  . atorvastatin (LIPITOR) 80 MG tablet Take 1 tablet (80 mg total) by mouth daily at 6 PM.  . Baclofen 5 MG TABS Take 5 mg by mouth at  bedtime.  . blood glucose meter kit and supplies Dispense based on patient and insurance preference. Use up to four times daily as directed. (FOR ICD-10 E10.9, E11.9).  . blood glucose meter kit and supplies Dispense based on patient and insurance preference. Use up to four times daily as directed. (FOR ICD-10 E10.9, E11.9).  Marland Kitchen Blood Glucose Monitoring Suppl (BLOOD GLUCOSE MONITOR SYSTEM) w/Device KIT Use to test blood sugars up to 4 times daily.  . Blood Pressure Monitoring DEVI Check bp in AM and PM  . escitalopram (LEXAPRO) 10 MG tablet Take 1 tablet (10 mg total) by mouth daily.  Marland Kitchen glucose blood (ACCU-CHEK GUIDE) test strip Use to test blood sugars 4 times daily.  . Insulin Glargine (BASAGLAR KWIKPEN) 100 UNIT/ML Inject 34 Units into the skin daily.  . Insulin Pen Needle 32G X 4 MM MISC Use to inject insulin up to 4 times daily.  . melatonin 3 MG TABS tablet Take 1 tablet (3 mg total) by mouth at bedtime as needed.  . metoprolol tartrate (LOPRESSOR) 50 MG tablet Take 1 tablet (50 mg total) by mouth 2 (two) times daily.  . Vitamin D, Ergocalciferol, (DRISDOL) 1.25 MG (50000 UNIT) CAPS capsule Take 1 capsule (50,000 Units total) by mouth every 7 (seven) days.   No facility-administered medications prior to visit.    Review of Systems  Respiratory:  Positive for cough.    {Labs  Heme  Chem  Endocrine  Serology  Results Review (optional):23779}   Objective    There  were no vitals taken for this visit.  {Show previous vital signs (optional):23777}   Physical Exam     Assessment & Plan     ***  No follow-ups on file.     I discussed the assessment and treatment plan with the patient. The patient was provided an opportunity to ask questions and all were answered. The patient agreed with the plan and demonstrated an understanding of the instructions.   The patient was advised to call back or seek an in-person evaluation if the symptoms worsen or if the condition fails to  improve as anticipated.  I provided *** minutes of non-face-to-face time during this encounter.  {provider attestation***:1}  [name], PA-C [facility] [phone] (phone) [phone] (fax)  [place]

## 2022-03-28 ENCOUNTER — Encounter: Payer: Self-pay | Admitting: Physician Assistant

## 2022-03-28 ENCOUNTER — Telehealth (INDEPENDENT_AMBULATORY_CARE_PROVIDER_SITE_OTHER): Payer: Self-pay | Admitting: Physician Assistant

## 2022-03-28 DIAGNOSIS — J069 Acute upper respiratory infection, unspecified: Secondary | ICD-10-CM

## 2022-03-28 MED ORDER — BENZONATATE 100 MG PO CAPS: 100.0000 mg | ORAL_CAPSULE | Freq: Three times a day (TID) | ORAL | 0 refills | Status: DC | PRN

## 2022-04-02 ENCOUNTER — Telehealth: Payer: Self-pay

## 2022-04-02 NOTE — Progress Notes (Signed)
  Chronic Care Management Pharmacy Assistant   Name: Samantha [name]  MRN: [id number] DOB: 01/11/1956  Initial Visit with Alex Fleury, CPP on 04/06/2022 @ 0900 Initial Visit Assessment (Please verify patient's actual insurance)   Conditions to be addressed/monitored: HTN, HLD, DMII, and CVA, AKI, Morbid Obesity  Primary concerns for visit include: Per the patient's spouse he is unsure of why they were referred over so at this time he does not have any primary concerns.  Recent office visits:  03/28/2022 Lindsay Drubel, PA-C (PCP Office Visit for Upper Respiratory Tract Infection-Started: Benzonatate 100 mg 3 times daily, No orders placed no follow-up noted  03/01/2022 Lindsay Drubel, PA-C (PCP Video Visit) for HTN- No Medication Changes noted, Neurology Referral placed, Cardiology Referral placed, DME order placed for a 4 wheeled rolling walker, Return in 4 weeks  Recent consult visits:  03/28/2022 Hemang Kalpeshkumar Shah, MD (Neurology) for CVA- No medication changes noted, No orders placed, No follow-up noted  Hospital visits:  Medication Reconciliation was completed by comparing discharge summary, patient's EMR and Pharmacy list, and upon discussion with patient.  Admitted to the hospital on 01/17/2022 due to CVA. Discharge date was 02/13/2022. Discharged from [facility].    New?Medications Started at Hospital Discharge:?? -Started  acetaminophen 325 MG tablet Take 2 tablets (650 mg total) by mouth every 4 (four) hours as needed for mild pain (or temp > 37.5 C (99.5 F)). apixaban 5 MG Tabs tablet Continue Eliquis 5 mg by mouth twice daily through 04/30/2022 and stop Baclofen 5 MG Tabs Take 5 mg by mouth at bedtime. escitalopram 10 MG tablet Take 1 tablet (10 mg total) by mouth daily. insulin glargine 100 UNIT/ML Solostar Pen Inject 34 Units into the skin daily. melatonin 3 MG Tabs tablet Take 1 tablet (3 mg total) by mouth at bedtime as needed. metoprolol  tartrate 50 MG tablet Take 1 tablet (50 mg total) by mouth 2 (two) times daily. Vitamin D (Ergocalciferol) 1.25 MG (50000 UNIT) Caps capsule Take 1 capsule (50,000 Units total) by mouth every 7 (seven) days.  Medication Changes at Hospital Discharge: -Changed --None ID  Medications Discontinued at Hospital Discharge: -Stopped aspirin 325 MG EC tablet clopidogrel 75 MG tablet (PLAVIX) insulin aspart 100 UNIT/ML injection (novoLOG) insulin glargine-yfgn 100 UNIT/ML injection (SEMGLEE)  Medications that remain the same after Hospital Discharge:??  -All other medications will remain the same.    Medication Reconciliation was completed by comparing discharge summary, patient's EMR and Pharmacy list, and upon discussion with patient.  Admitted to the hospital on 01/13/2022 due to CVA. Discharge date was 01/17/2022. [facility]    New?Medications Started at Hospital Discharge:?? -Started  DAPT w/Aspirin 325 mg po daily and Plavix 75 mg p o daily X 3 months followed by Aspirin alone for severe intercranial stenosis, Atorvastatin 80 mg 1 tablet daily  Medication Changes at Hospital Discharge: -Changed --Increase Semglee to 45 units daily --NovoLog 6 units TIDAC -- Increased Atorvastatin 80 mg 1 tablet daily  Medications Discontinued at Hospital Discharge: -Stopped  HCTZ and Lisinopril due to AKI at presentation per discharge note these can be resumed as tolerated in the future  Medications that remain the same after Hospital Discharge:??  -All other medications will remain the same.    Medications: Outpatient Encounter Medications as of 04/02/2022  Medication Sig   Accu-Chek Softclix Lancets lancets Use to test blood sugars 4 times daily.   acetaminophen (TYLENOL) 325 MG tablet Take 2 tablets (650 mg   total) by mouth every 4 (four) hours as needed for mild pain (or temp > 37.5 C (99.5 F)).   amLODipine (NORVASC) 10 MG tablet Take 1 tablet (10 mg total)  by mouth daily.   apixaban (ELIQUIS) 5 MG TABS tablet Continue Eliquis 5 mg by mouth twice daily through 04/30/2022 and stop   atorvastatin (LIPITOR) 80 MG tablet Take 1 tablet (80 mg total) by mouth daily at 6 PM.   Baclofen 5 MG TABS Take 5 mg by mouth at bedtime.   benzonatate (TESSALON) 100 MG capsule Take 1 capsule (100 mg total) by mouth 3 (three) times daily as needed for cough.   blood glucose meter kit and supplies Dispense based on patient and insurance preference. Use up to four times daily as directed. (FOR ICD-10 E10.9, E11.9).   blood glucose meter kit and supplies Dispense based on patient and insurance preference. Use up to four times daily as directed. (FOR ICD-10 E10.9, E11.9).   Blood Glucose Monitoring Suppl (BLOOD GLUCOSE MONITOR SYSTEM) w/Device KIT Use to test blood sugars up to 4 times daily.   Blood Pressure Monitoring DEVI Check bp in AM and PM   escitalopram (LEXAPRO) 10 MG tablet Take 1 tablet (10 mg total) by mouth daily.   glucose blood (ACCU-CHEK GUIDE) test strip Use to test blood sugars 4 times daily.   Insulin Glargine (BASAGLAR KWIKPEN) 100 UNIT/ML Inject 34 Units into the skin daily.   Insulin Pen Needle 32G X 4 MM MISC Use to inject insulin up to 4 times daily.   melatonin 3 MG TABS tablet Take 1 tablet (3 mg total) by mouth at bedtime as needed.   metoprolol tartrate (LOPRESSOR) 50 MG tablet Take 1 tablet (50 mg total) by mouth 2 (two) times daily.   Vitamin D, Ergocalciferol, (DRISDOL) 1.25 MG (50000 UNIT) CAPS capsule Take 1 capsule (50,000 Units total) by mouth every 7 (seven) days.   No facility-administered encounter medications on file as of 04/02/2022.   Care Gaps: Urine Microalbumin Tetanus/TDAP Colonoscopy Mammogram Zoster Vaccines COVID-19 Vaccine Booster 2 PAP Smear Diabetic Eye Exam PNA Vaccine Dexa Scan A1C > 9  Star Rating Drugs: Atorvastatin 80 mg last filled on 03/01/2022 for a 30-Day supply with [facility]  Questions for  Clinical Pharmacist:   1.Are you able to connect with Patient? I was able to speak with the patient's spouse.   2.Confirmed appointment date/time with patient/caregiver? Confirm appointment on 04/06/2022 @ 0900   with Alexandre Fleury CPP    3.Visit type telephone    4.Patient/Caregiver instructed to bring medications to appointment. Patient is aware to bring all medications and supplements to appointment   5.What, if any, problems do you have getting your medications from the pharmacy?  Per patient's spouse he get's her medications from [facility], and their daughter works at  pharmacy as well so there are no current issues with affordability.     6.What is your top health concern to discuss at your upcoming visit?  Per patient's spouse she is not mobile at this time.   7.Have you seen any other providers since your last visit? No  I spoke to the patient's spouse and he also reports that patient take's her blood sugars daily, and the highest he has seen it is 234. Patient is not on any special diet she just watches her sugar and carbohydrate intake.    Tosha Thompson, CPA/CMA Clinical Pharmacist Assistant Phone: [phone]  

## 2022-04-06 ENCOUNTER — Ambulatory Visit: Payer: Self-pay

## 2022-04-06 DIAGNOSIS — I63513 Cerebral infarction due to unspecified occlusion or stenosis of bilateral middle cerebral arteries: Secondary | ICD-10-CM

## 2022-04-06 DIAGNOSIS — E1165 Type 2 diabetes mellitus with hyperglycemia: Secondary | ICD-10-CM

## 2022-04-06 DIAGNOSIS — I10 Essential (primary) hypertension: Secondary | ICD-10-CM

## 2022-04-06 DIAGNOSIS — Z86718 Personal history of other venous thrombosis and embolism: Secondary | ICD-10-CM

## 2022-04-06 DIAGNOSIS — E1169 Type 2 diabetes mellitus with other specified complication: Secondary | ICD-10-CM

## 2022-04-06 NOTE — Progress Notes (Signed)
Chronic Care Management Pharmacy Note  04/17/2022 Name:  Samantha [name] MRN:  [id number] DOB:  1956/05/07  Summary: Patient presents for initial CCM consult. Today's visit was conducted 50% with Samantha [name], and 50% with Samantha [name], her husband who helps her manage her medications.  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 year old female who is a primary patient of Thedore Mins, Ria Comment, PA-C.  The CCM team was consulted for assistance with disease management and care coordination needs.    Engaged with patient by telephone for initial visit in response to provider referral for pharmacy case management and/or care coordination services.   Consent to Services:  The patient was given the following information about Chronic Care Management services today, agreed to services, and gave verbal consent: 1. CCM service includes personalized support from designated clinical staff supervised by the primary care provider, including individualized plan of care and coordination with other care providers 2. 24/7 contact phone numbers for assistance for urgent and routine care needs. 3. Service will only be billed when office clinical staff spend 20 minutes or more in a month to coordinate care. 4. Only one practitioner may furnish and bill the service in a calendar month. 5.The patient may stop CCM services at any time (effective at the end of the month) by phone call to the office staff. 6. The patient will be responsible for cost sharing (co-pay) of up to 20% of the service fee (after annual deductible is met). Patient agreed to services and consent obtained.  Patient Care Team: [name], PA-C as PCP - General (Physician Assistant) Germaine Pomfret, [facility] (Pharmacist)  Recent office visits: 03/28/2022 [name], PA-C (PCP Office Visit for Upper Respiratory Tract Infection-Started: Benzonatate 100 mg 3 times daily, No orders placed no follow-up noted   03/01/2022 [name], PA-C (PCP Video Visit) for HTN- No Medication Changes noted, Neurology Referral placed, Cardiology Referral placed, DME order placed for a 4 wheeled rolling walker, Return in 4 weeks  Recent consult visits: 03/28/2022 Hemang Leeanne Deed, MD (Neurology) for CVA- No medication changes noted, No orders placed, No follow-up noted  Hospital visits: Admitted to the hospital on 01/17/2022 due to CVA. Discharge date was 02/13/2022. Discharged from [facility].    Admitted to the hospital on 01/13/2022 due to CVA. Discharge date was 01/17/2022. Discharged from [facility].    Objective:  Lab Results  Component Value Date   CREATININE 1.26 (H) 02/09/2022   BUN 33 (H) 02/09/2022   GFRNONAA 48 (L) 02/09/2022   GFRAA 84 11/18/2019   NA 136 02/09/2022   K 4.3 02/09/2022   CALCIUM 8.6 (L) 02/09/2022   CO2 22 02/09/2022   GLUCOSE 139 (H) 02/09/2022    Lab Results  Component Value Date/Time   HGBA1C 13.1 (H) 01/14/2022 02:16 AM   HGBA1C 11.5 (A) 06/23/2021 10:42 AM   HGBA1C 10.5 (A) 12/13/2020 04:26 PM   HGBA1C 14.1 (H) 10/10/2018 03:18 PM    Last diabetic Eye exam:  Lab Results  Component Value Date/Time   HMDIABEYEEXA Retinopathy (A) 12/26/2020 12:00 AM    Last diabetic Foot exam: No results found for: "HMDIABFOOTEX"   Lab  Results  Component Value Date   CHOL 184 01/13/2022   HDL 53 01/13/2022   LDLCALC 115 (H) 01/13/2022   TRIG 82 01/13/2022   CHOLHDL 3.5 01/13/2022       Latest Ref Rng & Units 01/18/2022    5:42 AM 01/14/2022    2:16 AM 11/18/2019    4:07 PM  Hepatic Function  Total Protein 6.5 - 8.1 g/dL 5.9  6.3  7.2    Albumin 3.5 - 5.0 g/dL 2.7  2.8  4.0   AST 15 - 41 U/L 24  16  16   ALT 0 - 44 U/L 21  16  10   Alk Phosphatase 38 - 126 U/L 68  75  86   Total Bilirubin 0.3 - 1.2 mg/dL 1.0  0.5  0.3     Lab Results  Component Value Date/Time   TSH 1.590 11/18/2019 04:07 PM       Latest Ref Rng & Units 01/30/2022    5:38 AM 01/23/2022    6:04 AM 01/18/2022    5:42 AM  CBC  WBC 4.0 - 10.5 K/uL 8.8  7.8  8.2   Hemoglobin 12.0 - 15.0 g/dL 10.3  11.0  11.5   Hematocrit 36.0 - 46.0 % 32.9  34.1  35.6   Platelets 150 - 400 K/uL 195  196  169     Lab Results  Component Value Date/Time   VD25OH 24.03 (L) 01/18/2022 05:42 AM    Clinical ASCVD: Yes  The ASCVD Risk score (Arnett DK, et al., 2019) failed to calculate for the following reasons:   The patient has a prior MI or stroke diagnosis       12/13/2020    4:29 PM 10/10/2018    2:07 PM  Depression screen PHQ 2/9  Decreased Interest 0 0  Down, Depressed, Hopeless 0 0  PHQ - 2 Score 0 0  Altered sleeping 1   Tired, decreased energy 1   Change in appetite 0   Feeling bad or failure about yourself  0   Trouble concentrating 0   Moving slowly or fidgety/restless 0   Suicidal thoughts 0   PHQ-9 Score 2   Difficult doing work/chores Not difficult at all      Social History   Tobacco Use  Smoking Status Never  Smokeless Tobacco Never   BP Readings from Last 3 Encounters:  04/13/22 121/87  02/12/22 (!) 140/52  01/17/22 (!) 150/71   Pulse Readings from Last 3 Encounters:  04/13/22 67  02/12/22 67  01/17/22 87   Wt Readings from Last 3 Encounters:  04/13/22 276 lb (125.2 kg)  02/07/22 276 lb 7.3 oz (125.4 kg)  01/16/22 287 lb 14.7 oz (130.6 kg)   BMI Readings from Last 3 Encounters:  04/13/22 53.90 kg/m  02/07/22 53.99 kg/m  01/16/22 56.23 kg/m    Assessment/Interventions: Review of patient past medical history, allergies, medications, health status, including review of consultants reports, laboratory and other test data,  was performed as part of comprehensive evaluation and provision of chronic care management services.   SDOH:  (Social Determinants of Health) assessments and interventions performed: Yes SDOH Interventions    Flowsheet Row Most Recent Value  SDOH Interventions   Financial Strain Interventions Intervention Not Indicated  Transportation Interventions Intervention Not Indicated      SDOH Screenings   Alcohol Screen: Low Risk  (12/13/2020)   Alcohol Screen    Last Alcohol Screening Score (AUDIT): 0  Depression (PHQ2-9): Low Risk  (  12/13/2020)   Depression (PHQ2-9)    PHQ-2 Score: 2  Financial Resource Strain: Low Risk  (04/17/2022)   Overall Financial Resource Strain (CARDIA)    Difficulty of Paying Living Expenses: Not hard at all  Food Insecurity: Not on file  Housing: Not on file  Physical Activity: Not on file  Social Connections: Not on file  Stress: Not on file  Tobacco Use: Low Risk  (04/13/2022)   Patient History    Smoking Tobacco Use: Never    Smokeless Tobacco Use: Never    Passive Exposure: Not on file  Transportation Needs: No Transportation Needs (04/17/2022)   PRAPARE - Transportation    Lack of Transportation (Medical): No    Lack of Transportation (Non-Medical): No    CCM Care Plan  Allergies  Allergen Reactions   Penicillins Rash    Medications Reviewed Today     Reviewed by Fleury, Alexandre A, RPH (Pharmacist) on 04/17/22 at 1352  Med List Status: <None>   Medication Order Taking? Sig Documenting Provider Last Dose Status Informant  Accu-Chek Softclix Lancets lancets [id number]  Use to test blood sugars 4 times daily. Angiulli, Daniel J, PA-C  Active   acetaminophen (TYLENOL) 325 MG tablet [id number]  Take 2 tablets (650 mg total) by mouth every 4 (four) hours as needed for mild pain (or temp > 37.5 C (99.5 F)). Angiulli, Daniel J, PA-C  Active   amLODipine (NORVASC) 10 MG tablet [id number] Yes Take 1 tablet (10 mg total) by mouth daily. Drubel, Lindsay,  PA-C Taking Active   apixaban (ELIQUIS) 5 MG TABS tablet [id number] Yes Continue Eliquis 5 mg by mouth twice daily through 04/30/2022 and stop Angiulli, Daniel J, PA-C Taking Active   atorvastatin (LIPITOR) 80 MG tablet [id number] Yes Take 1 tablet (80 mg total) by mouth daily at 6 PM. Drubel, Lindsay, PA-C Taking Active   Baclofen 5 MG TABS [id number] Yes Take 5 mg by mouth at bedtime. Drubel, Lindsay, PA-C Taking Active   blood glucose meter kit and supplies [id number]  Dispense based on patient and insurance preference. Use up to four times daily as directed. (FOR ICD-10 E10.9, E11.9). Angiulli, Daniel J, PA-C  Active   blood glucose meter kit and supplies [id number]  Dispense based on patient and insurance preference. Use up to four times daily as directed. (FOR ICD-10 E10.9, E11.9). Angiulli, Daniel J, PA-C  Active   Blood Glucose Monitoring Suppl (BLOOD GLUCOSE MONITOR SYSTEM) w/Device KIT [id number]  Use to test blood sugars up to 4 times daily. Angiulli, Daniel J, PA-C  Active   Blood Pressure Monitoring DEVI [id number]  Check bp in AM and PM Drubel, Lindsay, PA-C  Active   escitalopram (LEXAPRO) 10 MG tablet [id number] Yes Take 1 tablet (10 mg total) by mouth daily. Drubel, Lindsay, PA-C Taking Active   glucose blood (ACCU-CHEK GUIDE) test strip [id number]  Use to test blood sugars 4 times daily. Angiulli, Daniel J, PA-C  Active   Insulin Glargine (BASAGLAR KWIKPEN) 100 UNIT/ML [id number]  Inject 34 Units into the skin daily. Drubel, Lindsay, PA-C  Active   Insulin Pen Needle 32G X 4 MM MISC [id number]  Use to inject insulin up to 4 times daily. Drubel, Lindsay, PA-C  Active   melatonin 3 MG TABS tablet [id number] Yes Take 1 tablet (3 mg total) by mouth at bedtime as needed. Angiulli, Daniel J, PA-C Taking Active   metoprolol tartrate (LOPRESSOR) 50 MG tablet [id number] Yes Take 1 tablet (50 mg total) by mouth 2 (two) times   daily. Drubel, Lindsay, PA-C Taking Active   Vitamin D, Ergocalciferol, (DRISDOL)  1.25 MG (50000 UNIT) CAPS capsule [id number] Yes Take 1 capsule (50,000 Units total) by mouth every 7 (seven) days. Angiulli, Daniel J, PA-C Taking Active             Patient Active Problem List   Diagnosis Date Noted   Sinus tachycardia    Labile blood pressure    Uncontrolled type 2 diabetes mellitus with hyperglycemia (HCC)    CVA (cerebral vascular accident) (HCC) 01/13/2022   AKI (acute kidney injury) (HCC) 01/13/2022   Hyponatremia 01/13/2022   Elevated troponin 01/13/2022   Cerebrovascular accident (CVA) (HCC) 12/15/2020   Noncompliance with diabetes treatment 12/15/2020   Hyperlipidemia associated with type 2 diabetes mellitus (HCC) 08/11/2020   Type 2 diabetes mellitus with other specified complication (HCC) 10/10/2018   Hypertension 10/10/2018   Morbid obesity (HCC) 10/10/2018    Immunization History  Administered Date(s) Administered   PFIZER(Purple Top)SARS-COV-2 Vaccination 02/18/2020    Conditions to be addressed/monitored:  Hypertension, Hyperlipidemia, Diabetes, and History of CVA, History of DVT   Care Plan : General Pharmacy (Adult)  Updates made by Ravinder Lukehart A, RPH since 04/17/2022 12:00 AM     Problem: Hypertension, Hyperlipidemia, Diabetes, and History of CVA, History of DVT   Priority: High     Long-Range Goal: Patient-Specific Goal   Start Date: 04/17/2022  Expected End Date: 04/18/2023  This Visit's Progress: On track  Priority: High  Note:   Current Barriers:  Unable to achieve control of Stroke   Pharmacist Clinical Goal(s):  Patient will achieve control of stroke as evidenced by control of HTN, HLD and T2DM through collaboration with PharmD and provider.   Interventions: 1:1 collaboration with Drubel, Lindsay, PA-C regarding development and update of comprehensive plan of care as evidenced by provider attestation and co-signature Inter-disciplinary care team collaboration (see longitudinal plan of care) Comprehensive medication  review performed; medication list updated in electronic medical record  Hypertension (BP goal <130/80) -Controlled -Current treatment: Amlodipine 10 mg daily  Metoprolol tartrate 50 mg twice daily  -Medications previously tried: NA  -Current home readings: Not monitoring regularly -Denies hypotensive/hypertensive symptoms -Recommended to continue current medication  Hyperlipidemia: (LDL goal < 70) -Uncontrolled -Current treatment: Atorvastatin 80 mg daily  -Medications previously tried: NA  -Weakness in legs, arms. Occupational therapy x1 weekly. PT 2x weekly.  -Recommended to continue current medication  Diabetes (A1c goal <8%) -Uncontrolled -Diagnosed: 6 months prior  -Current medications: Basaglar 34 units daily (0.27 u/kg) -Medications previously tried: Glimepiride, Farxiga, Januvia  -Current home glucose readings  Fasting  8-Jun 139  7-Jun 222  6-Jun 134  5-Jun 154  4-Jun 167  3-Jun 168  2-Jun 168  1-Jun 204  31-May 106  Average 162  -Denies hypoglycemic/hyperglycemic symptoms -Discussed the need for reducing stroke risk, recommend initiation of GLP-1 agonist. Patient's husband was amenable but patient was worried starting it might "mess up her sugars." We discussed at length the efficacy and safety of the new medication but she was still skeptical and wanted to think it over.  -Recommended to continue current medication  Depression/Anxiety (Goal: Achieve symptom remission) -Controlled -Current treatment: Escitalopram 10 mg daily  Melatonin 3 mg nightly  -Medications previously tried/failed: NA -PHQ9: 2 -Recommended to continue current medication  DVT (Goal: Prevent clots) -Controlled -Current treatment  Eliquis 5 mg twice daily  -Medications previously tried: NA  continue Eliquis until 04/30/2021 -Recommended to continue current medication  Patient Goals/Self-Care Activities Patient   will:  - check glucose daily before breakfast, document, and provide  at future appointments check blood pressure 2-3 times weekly, document, and provide at future appointments  Follow Up Plan: Telephone follow up appointment with care management team member scheduled for:  05/08/2022 at 9:15 AM      Medication Assistance: None required.  Patient affirms current coverage meets needs.  Compliance/Adherence/Medication fill history: Care Gaps: Urine Microalbumin Tetanus/TDAP Colonoscopy Mammogram Zoster Vaccines COVID-19 Vaccine Booster 2 PAP Smear Diabetic Eye Exam PNA Vaccine Dexa Scan A1C > 9  Star-Rating Drugs: Atorvastatin 80 mg last filled on 03/01/2022 for a 30-Day supply with [facility]  Patient's preferred pharmacy is:  [facility] [address] ([address]), [place] [address] [place]) [place] [address] Phone: [phone] Fax: [place] [address] [place] [address] Phone: [phone] Fax: [phone]  Uses pill box? Yes Pt endorses 100% compliance  We discussed: Current pharmacy is preferred with insurance plan and patient is satisfied with pharmacy services Patient decided to: Continue current medication management strategy  Care Plan and Follow Up Patient Decision:  Patient agrees to Care Plan and Follow-up.  Plan: Telephone follow up appointment with care management team member scheduled for:  05/08/2022 at [place], PharmD, Para March, [place] [phone]

## 2022-04-12 ENCOUNTER — Ambulatory Visit: Payer: Self-pay

## 2022-04-12 NOTE — Telephone Encounter (Signed)
Called and spoke to pt's husband. He was asking if it's ok for pt to take Vit B OTC to help boost her energy level since she doesn't have a lot of energy especially after completing PT. I advised him I didn't see where that would be an issue and wouldn't affect the other medications she is prescribed. He states the pharmacist recommended it and he wanted to make sure it was safe before purchasing. No further assistance needed.   Summary: wants a fu call re pt taking a vitamin   Pt spouse , Renae Fickle wants a FU call re if his wife can take a vitamin with all her other meds/ fu at [phone]      Reason for Disposition  Caller has medicine question only, adult not sick, AND triager answers question  Answer Assessment - Initial Assessment Questions 1. NAME of MEDICATION: "What medicine are you calling about?"     Vit B  2. QUESTION: "What is your question?" (e.g., double dose of medicine, side effect)     Wants to make sure it ok to take with other medications 3. PRESCRIBING HCP: "Who prescribed it?" Reason: if prescribed by specialist, call should be referred to that group.     NA pharmacy recommended 4. SYMPTOMS: "Do you have any symptoms?"     Fatigue and no energy  Protocols used: Medication Question Call-A-AH

## 2022-04-13 ENCOUNTER — Emergency Department (HOSPITAL_COMMUNITY): Payer: Medicare Other

## 2022-04-13 ENCOUNTER — Encounter (HOSPITAL_COMMUNITY): Payer: Self-pay

## 2022-04-13 ENCOUNTER — Emergency Department (HOSPITAL_COMMUNITY)
Admission: EM | Admit: 2022-04-13 | Discharge: 2022-04-13 | Disposition: A | Discharge status: Home / Self Care | Payer: Medicare Other | Attending: Emergency Medicine | Admitting: Emergency Medicine

## 2022-04-13 ENCOUNTER — Other Ambulatory Visit: Payer: Self-pay

## 2022-04-13 DIAGNOSIS — I10 Essential (primary) hypertension: Secondary | ICD-10-CM | POA: Diagnosis not present

## 2022-04-13 DIAGNOSIS — Z7901 Long term (current) use of anticoagulants: Secondary | ICD-10-CM | POA: Diagnosis not present

## 2022-04-13 DIAGNOSIS — E119 Type 2 diabetes mellitus without complications: Secondary | ICD-10-CM | POA: Insufficient documentation

## 2022-04-13 DIAGNOSIS — Z79899 Other long term (current) drug therapy: Secondary | ICD-10-CM | POA: Insufficient documentation

## 2022-04-13 DIAGNOSIS — R040 Epistaxis: Secondary | ICD-10-CM | POA: Insufficient documentation

## 2022-04-13 DIAGNOSIS — Z794 Long term (current) use of insulin: Secondary | ICD-10-CM | POA: Diagnosis not present

## 2022-04-13 MED ORDER — SALINE SPRAY 0.65 % NA SOLN
1.0000 | Freq: Once | NASAL | Status: AC
  Administered 2022-04-13: 1 via NASAL
  Filled 2022-04-13: qty 44

## 2022-04-13 MED ORDER — OXYMETAZOLINE HCL 0.05 % NA SOLN
1.0000 | Freq: Once | NASAL | Status: AC
  Administered 2022-04-13: 1 via NASAL
  Filled 2022-04-13: qty 30

## 2022-04-13 NOTE — ED Provider Notes (Addendum)
[facility] DEPARTMENT Provider Note   CSN: [id number] Arrival date & time: 04/13/22  0841     History  Chief Complaint  Patient presents with   Epistaxis    Samantha [name] is a 66 y.o. female.  HPI     66 year old female comes in with chief complaint of bloody sputum.  She has history of stroke, DVT on Eliquis, hypertension, hyperlipidemia and diabetes.  Patient indicates that she started coughing all of a sudden this morning, and then spit out a lot of blood.  She is unsure if the bleeding came from her nose or from her chest.  More recently she did have some URI-like symptoms, but she has not really been experiencing any cough, fevers.  She denies any chest pain, shortness of breath.  She has been taking her Eliquis as prescribed.  Home Medications Prior to Admission medications   Medication Sig Start Date End Date Taking? Authorizing Provider  Accu-Chek Softclix Lancets lancets Use to test blood sugars 4 times daily. 02/13/22   Angiulli, Lavon Paganini, PA-C  acetaminophen (TYLENOL) 325 MG tablet Take 2 tablets (650 mg total) by mouth every 4 (four) hours as needed for mild pain (or temp > 37.5 C (99.5 F)). 02/12/22   Angiulli, Lavon Paganini, PA-C  amLODipine (NORVASC) 10 MG tablet Take 1 tablet (10 mg total) by mouth daily. 03/01/22   [name], PA-C  apixaban (ELIQUIS) 5 MG TABS tablet Continue Eliquis 5 mg by mouth twice daily through 04/30/2022 and stop 02/12/22   Angiulli, Lavon Paganini, PA-C  atorvastatin (LIPITOR) 80 MG tablet Take 1 tablet (80 mg total) by mouth daily at 6 PM. 03/01/22   Drubel, Ria Comment, PA-C  Baclofen 5 MG TABS Take 5 mg by mouth at bedtime. 03/01/22   [name], PA-C  blood glucose meter kit and supplies Dispense based on patient and insurance preference. Use up to four times daily as directed. (FOR ICD-10 E10.9, E11.9). 02/13/22   Angiulli, Lavon Paganini, PA-C  blood glucose meter kit and supplies Dispense based on patient and insurance  preference. Use up to four times daily as directed. (FOR ICD-10 E10.9, E11.9). 02/13/22   Angiulli, Lavon Paganini, PA-C  Blood Glucose Monitoring Suppl (BLOOD GLUCOSE MONITOR SYSTEM) w/Device KIT Use to test blood sugars up to 4 times daily. 02/13/22   Angiulli, Lavon Paganini, PA-C  Blood Pressure Monitoring DEVI Check bp in AM and PM 03/01/22   Drubel, Ria Comment, PA-C  escitalopram (LEXAPRO) 10 MG tablet Take 1 tablet (10 mg total) by mouth daily. 03/01/22   [name], PA-C  glucose blood (ACCU-CHEK GUIDE) test strip Use to test blood sugars 4 times daily. 02/13/22   Angiulli, Lavon Paganini, PA-C  Insulin Glargine (BASAGLAR KWIKPEN) 100 UNIT/ML Inject 34 Units into the skin daily. 03/01/22   Drubel, Ria Comment, PA-C  Insulin Pen Needle 32G X 4 MM MISC Use to inject insulin up to 4 times daily. 02/26/22   Drubel, Ria Comment, PA-C  melatonin 3 MG TABS tablet Take 1 tablet (3 mg total) by mouth at bedtime as needed. 02/12/22   Angiulli, Lavon Paganini, PA-C  metoprolol tartrate (LOPRESSOR) 50 MG tablet Take 1 tablet (50 mg total) by mouth 2 (two) times daily. 03/01/22   [name], PA-C  Vitamin D, Ergocalciferol, (DRISDOL) 1.25 MG (50000 UNIT) CAPS capsule Take 1 capsule (50,000 Units total) by mouth every 7 (seven) days. 02/15/22   Angiulli, Lavon Paganini, PA-C      Allergies    Penicillins  Review of Systems   Review of Systems  All other systems reviewed and are negative.   Physical Exam Updated Vital Signs BP 121/87   Pulse 67   Temp 97.9 F (36.6 C)   Resp 16   Ht 5' (1.524 m)   Wt 125.2 kg   SpO2 99%   BMI 53.90 kg/m  Physical Exam Vitals and nursing note reviewed.  Constitutional:      Appearance: She is well-developed.  HENT:     Head: Atraumatic.     Nose:     Comments: L nare has evidence of dry blood Eyes:     Extraocular Movements: Extraocular movements intact.     Pupils: Pupils are equal, round, and reactive to light.  Cardiovascular:     Rate and Rhythm: Normal rate.  Pulmonary:     Effort:  Pulmonary effort is normal.  Musculoskeletal:     Cervical back: Normal range of motion and neck supple.  Skin:    General: Skin is warm and dry.  Neurological:     Mental Status: She is alert and oriented to person, place, and time.     ED Results / Procedures / Treatments   Labs (all labs ordered are listed, but only abnormal results are displayed) Labs Reviewed - No data to display  EKG None  Radiology DG Chest 2 View  Result Date: 04/13/2022 CLINICAL DATA:  Coughed up blood earlier.  On Eliquis. EXAM: CHEST - 2 VIEW COMPARISON:  Portable chest 01/13/2022. FINDINGS: Suboptimal inspiration on the lateral view. The heart size appears stable at the upper limits of normal. The mediastinal contours are stable. The lungs appear clear on the frontal examination. Increased density at the lung bases on the lateral view is attributed to suboptimal inspiration. There is no pleural effusion or pneumothorax. Mild degenerative changes in the thoracic spine. IMPRESSION: No evidence of active cardiopulmonary process allowing for suboptimal inspiration on the lateral view. Electronically Signed   By: Richardean Sale M.D.   On: 04/13/2022 10:38    Procedures Procedures    Medications Ordered in ED Medications  oxymetazoline (AFRIN) 0.05 % nasal spray 1 spray (has no administration in time range)  sodium chloride (OCEAN) 0.65 % nasal spray 1 spray (has no administration in time range)    ED Course/ Medical Decision Making/ A&P                           Medical Decision Making Amount and/or Complexity of Data Reviewed Radiology: ordered.  Risk OTC drugs.   This patient presents to the ED with chief complaint(s) of nosebleed with pertinent past medical history of DVT on Xarelto, stroke, diabetes which further complicates the presenting complaint. The complaint involves an extensive differential diagnosis and also carries with it a high risk of complications and morbidity.    There is a  question regarding the source.  Patient coughed up a lot of blood.  She did have recent URI, but her cough has pretty much resolved.    Differential diagnoses therefore include pneumonia, severe bronchitis, pulmonary hemorrhage and lower respiratory tract etiologies in addition to epistaxis.  Etiology also included GI pathology.  However, patient does not have any chest pain, lung exam is clear, she denies any sore throat or difficulty in swallowing and our nose exam does indicate there is clear evidence of dried blood in the left nare.  Therefore, differential diagnosis is effectively limited to anterior epistaxis, which is  now well controlled.  No clear causative etiology for the anterior epistaxis based on history and exam.  The initial plan is to observe the patient in the ER for short time.  We will also get an x-ray of her chest to make sure there is no evidence of any pulmonary hemorrhage or pleural effusion/pneumonia.   Additional history obtained: Additional history obtained from family Records reviewed previous admission documents  Independent labs interpretation:  Lab work-up was considered, however it would not change our management in a patient who is not actively bleeding.  Therefore I have not ordered any CBC or metabolic profile  Independent visualization of imaging: - I independently visualized the following imaging with scope of interpretation limited to determining acute life threatening conditions related to emergency care: X-ray of the chest, which revealed no evidence of any pulmonary opacity that would be concerning for pulmonary hemorrhage or bronchitis or pneumonia  Treatment and Reassessment: Patient has not had any bleeding in the ER.  We will discharge her with Afrin and Ocean nasal spray for as needed use.  Strict ER return precautions have been discussed, and patient is agreeing with the plan and is comfortable with the workup done and the recommendations from the  ER.   Final Clinical Impression(s) / ED Diagnoses Final diagnoses:  Epistaxis    Rx / DC Orders ED Discharge Orders     None          Varney Biles, MD 04/13/22 1248

## 2022-04-13 NOTE — ED Triage Notes (Signed)
Patient called EMS because she woke up with a nosebleed.  Nosebleed has been controlled with EMS entire duration. No bleeding at this time.  HX of stroke with right sided deficits.

## 2022-04-13 NOTE — ED Notes (Signed)
Patient Alert and oriented to baseline. Stable and ambulatory to baseline. Patient verbalized understanding of the discharge instructions.  Patient belongings were taken by the patient.   

## 2022-04-13 NOTE — Discharge Instructions (Addendum)
We suspect at this time that indeed the source of bleeding was likely her left nose.  That is the only area where we see evidence of dried blood right now.  X-ray of the chest is reassuring.  Read instructions provided nosebleed.  You had a nose bleed which stopped spontaneously. Nose bleeds can recur however, so please read the instructions below.  If the bleeding recurs, please apply direct pressure to the nose for 5 minutes straight, breathing from the mouth and spitting out any blood. After 5 minutes of holding pressure, if the bleeding continues, do the same thing again for 5 more minutes. If after 2nd round of holding pressure the bleeding persists - clear the nose and apply afrin spray. After applying afrin spray to the nares, hold pressure again for 5 minutes. If the bleeding continues despite these measures, then come to the ER while holding direct pressure to the nares.  For now - please do not blow your nose, or put fingers in your nose to clear up any clots.

## 2022-04-16 ENCOUNTER — Telehealth: Payer: Self-pay | Admitting: Physician Assistant

## 2022-04-16 NOTE — Telephone Encounter (Signed)
Home Health Verbal Orders - Caller/Agency: [facility] Number: [phone] Requesting OT/PT/Skilled Nursing/Social Work/Speech Therapy: PT and OT  Frequency:   PT: 1w9  OT eval

## 2022-04-17 NOTE — Patient Instructions (Signed)
Visit Information It was great speaking with you today!  Please let me know if you have any questions about our visit.   Goals Addressed             This Visit's Progress    Monitor and Manage My Blood Sugar-Diabetes Type 2       Timeframe:  Long-Range Goal Priority:  High Start Date: 04/17/22                            Expected End Date: 04/18/23                      Follow Up within 30 days    - check blood sugar at prescribed times - check blood sugar before and after exercise - check blood sugar if I feel it is too high or too low - take the blood sugar log to all doctor visits    Why is this important?   Checking your blood sugar at home helps to keep it from getting very high or very low.  Writing the results in a diary or log helps the doctor know how to care for you.  Your blood sugar log should have the time, date and the results.  Also, write down the amount of insulin or other medicine that you take.  Other information, like what you ate, exercise done and how you were feeling, will also be helpful.     Notes:         Patient Care Plan: General Pharmacy (Adult)     Problem Identified: Hypertension, Hyperlipidemia, Diabetes, and History of CVA, History of DVT   Priority: High     Long-Range Goal: Patient-Specific Goal   Start Date: 04/17/2022  Expected End Date: 04/18/2023  This Visit's Progress: On track  Priority: High  Note:   Current Barriers:  Unable to achieve control of Stroke   Pharmacist Clinical Goal(s):  Patient will achieve control of stroke as evidenced by control of HTN, HLD and T2DM through collaboration with PharmD and provider.   Interventions: 1:1 collaboration with [name], PA-C regarding development and update of comprehensive plan of care as evidenced by provider attestation and co-signature Inter-disciplinary care team collaboration (see longitudinal plan of care) Comprehensive medication review performed; medication list  updated in electronic medical record  Hypertension (BP goal <130/80) -Controlled -Current treatment: Amlodipine 10 mg daily  Metoprolol tartrate 50 mg twice daily  -Medications previously tried: NA  -Current home readings: Not monitoring regularly -Denies hypotensive/hypertensive symptoms -Recommended to continue current medication  Hyperlipidemia: (LDL goal < 70) -Uncontrolled -Current treatment: Atorvastatin 80 mg daily  -Medications previously tried: NA  -Weakness in legs, arms. Occupational therapy x1 weekly. PT 2x weekly.  -Recommended to continue current medication  Diabetes (A1c goal <8%) -Uncontrolled -Diagnosed: 6 months prior  -Current medications: Basaglar 34 units daily (0.27 u/kg) -Medications previously tried: Glimepiride, Darien Ramus  -Current home glucose readings  Fasting  8-Jun 139  7-Jun 222  6-Jun 134  5-Jun 154  4-Jun 167  3-Jun 168  2-Jun 168  1-Jun 204  31-May 106  Average 162  -Denies hypoglycemic/hyperglycemic symptoms -Discussed the need for reducing stroke risk, recommend initiation of GLP-1 agonist. Patient's husband was amenable but patient was worried starting it might "mess up her sugars." We discussed at length the efficacy and safety of the new medication but she was still skeptical and wanted to think it over.  -  Recommended to continue current medication  Depression/Anxiety (Goal: Achieve symptom remission) -Controlled -Current treatment: Escitalopram 10 mg daily  Melatonin 3 mg nightly  -Medications previously tried/failed: NA -PHQ9: 2 -Recommended to continue current medication  DVT (Goal: Prevent clots) -Controlled -Current treatment  Eliquis 5 mg twice daily  -Medications previously tried: NA  continue Eliquis until 04/30/2021 -Recommended to continue current medication  Patient Goals/Self-Care Activities Patient will:  - check glucose daily before breakfast, document, and provide at future appointments check blood  pressure 2-3 times weekly, document, and provide at future appointments  Follow Up Plan: Telephone follow up appointment with care management team member scheduled for:  05/08/2022 at 9:15 AM       Ms. Pieratt was given information about Chronic Care Management services today including:  CCM service includes personalized support from designated clinical staff supervised by her physician, including individualized plan of care and coordination with other care providers 24/7 contact phone numbers for assistance for urgent and routine care needs. Standard insurance, coinsurance, copays and deductibles apply for chronic care management only during months in which we provide at least 20 minutes of these services. Most insurances cover these services at 100%, however patients may be responsible for any copay, coinsurance and/or deductible if applicable. This service may help you avoid the need for more expensive face-to-face services. Only one practitioner may furnish and bill the service in a calendar month. The patient may stop CCM services at any time (effective at the end of the month) by phone call to the office staff.  Patient agreed to services and verbal consent obtained.   Patient verbalizes understanding of instructions and care plan provided today and agrees to view in MyChart. Active MyChart status and patient understanding of how to access instructions and care plan via MyChart confirmed with patient.     Angelena Sole, PharmD, Patsy [place], CPP  Clinical Pharmacist Practitioner  [facility] [phone]

## 2022-05-03 ENCOUNTER — Ambulatory Visit: Payer: Medicare Other | Admitting: Nurse Practitioner

## 2022-05-07 ENCOUNTER — Telehealth: Payer: Self-pay

## 2022-05-07 NOTE — Progress Notes (Signed)
    Chronic Care Management Pharmacy Assistant   Name: Samantha [name]  MRN: [id number] DOB: 1956-01-22  Patient's spouse called to reminded of the patient's telephone appointment with Angelena Sole, CPP on 05/08/2022 @ 0930.  Patient's spouse advised he would be at work during this time and requested to reschedule the patient's appointment. Appointment has been rescheduled for 05/21/2022 @ 1545. The patient's spouse also requested a call from CPP whenever the CPP has a chance as he has a question he needs to ask him. I advised the patient that I would send a task over to CPP with his request.  Adelene Idler, CPA/CMA Clinical Pharmacist Assistant Phone: [phone]

## 2022-05-08 ENCOUNTER — Telehealth: Payer: Self-pay

## 2022-05-15 ENCOUNTER — Other Ambulatory Visit: Payer: Self-pay | Admitting: Physician Assistant

## 2022-05-15 ENCOUNTER — Telehealth: Payer: Self-pay | Admitting: Physician Assistant

## 2022-05-15 DIAGNOSIS — I63513 Cerebral infarction due to unspecified occlusion or stenosis of bilateral middle cerebral arteries: Secondary | ICD-10-CM

## 2022-05-15 NOTE — Telephone Encounter (Signed)
[facility] faxed refill request for the following medications:  apixaban (ELIQUIS) 5 MG TABS tablet   Please advise.

## 2022-05-15 NOTE — Addendum Note (Signed)
Addended by: Lily Kocher on: 05/15/2022 10:05 AM   Modules accepted: Orders

## 2022-05-18 ENCOUNTER — Telehealth: Payer: Self-pay

## 2022-05-18 ENCOUNTER — Other Ambulatory Visit: Payer: Self-pay | Admitting: Physician Assistant

## 2022-05-18 DIAGNOSIS — I63513 Cerebral infarction due to unspecified occlusion or stenosis of bilateral middle cerebral arteries: Secondary | ICD-10-CM

## 2022-05-18 MED ORDER — APIXABAN 5 MG PO TABS: ORAL_TABLET | ORAL | 0 refills | Status: DC

## 2022-05-18 NOTE — Progress Notes (Signed)
    Chronic Care Management Pharmacy Assistant   Name: Samantha [name]  MRN: [id number] DOB: Mar 22, 1956  Patient's spouse called to be reminded of her telephone appointment with Angelena Sole, CPP on 05/21/2022 @ 1545  Patient aware of appointment date, time, and type of appointment (either telephone or in person). Patient aware to have/bring all medications, supplements, blood pressure and/or blood sugar logs to visit.  Can you please ask patient what insurance does she have because the one on file isn't Medicare  Star Rating Drug: Atorvastatin 80 mg last filled on 05/07/2022 for a 30-Day supply with [facility]  Any gaps in medications fill history? No  Care Gaps: Urine Microalbumin Tetanus/TDAP Colonoscopy Mammogram Zoster Vaccines COVID-19 Vaccine Booster 2 PAP smear Diabetic Eye Exam PNA Vaccine  Dexa Scan A1C > 9 BP>140/90    Adelene Idler, CPA/CMA Clinical Pharmacist Assistant Phone: [phone]

## 2022-05-21 ENCOUNTER — Telehealth: Payer: Self-pay

## 2022-05-21 NOTE — Progress Notes (Deleted)
Chronic Care Management Pharmacy Note  05/21/2022 Name:  Samantha [name] MRN:  [id number] DOB:  09-May-1956  Summary: Patient presents for initial CCM consult. Today's visit was conducted 50% with Shaquela, and 50% with Eddie Dibbles, her husband who helps her manage her medications.  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 year old female who is a primary patient of Thedore Mins, Ria Comment, PA-C.  The CCM team was consulted for assistance with disease management and care coordination needs.    Engaged with patient by telephone for initial visit in response to provider referral for pharmacy case management and/or care coordination services.   Consent to Services:  The patient was given the following information about Chronic Care Management services today, agreed to services, and gave verbal consent: 1. CCM service includes personalized support from designated clinical staff supervised by the primary care provider, including individualized plan of care and coordination with other care providers 2. 24/7 contact phone numbers for assistance for urgent and routine care needs. 3. Service will only be billed when office clinical staff spend 20 minutes or more in a month to coordinate care. 4. Only one practitioner may furnish and bill the service in a calendar month. 5.The patient may stop CCM services at any time (effective at the end of the month) by phone call to the office staff. 6. The patient will be responsible for cost sharing (co-pay) of up to 20% of the service fee (after annual deductible is met). Patient agreed to services and consent obtained.  Patient Care Team: [name], PA-C as PCP - General (Physician Assistant) Germaine Pomfret, [facility] (Pharmacist)  Recent office visits: 03/28/2022 [name], PA-C (PCP Office Visit for Upper Respiratory Tract Infection-Started: Benzonatate 100 mg 3 times daily, No orders placed no follow-up noted   03/01/2022 [name], PA-C (PCP Video Visit) for HTN- No Medication Changes noted, Neurology Referral placed, Cardiology Referral placed, DME order placed for a 4 wheeled rolling walker, Return in 4 weeks  Recent consult visits: 03/28/2022 Hemang Leeanne Deed, MD (Neurology) for CVA- No medication changes noted, No orders placed, No follow-up noted  Hospital visits: 04/13/22: Patient presented to ED for Epistaxis   Admitted to the hospital on 01/17/2022 due to CVA. Discharge date was 02/13/2022. Discharged from [facility].    Admitted to the hospital on 01/13/2022 due to CVA. Discharge date was 01/17/2022. Discharged from [facility].    Objective:  Lab Results  Component Value Date   CREATININE 1.26 (H) 02/09/2022   BUN 33 (H) 02/09/2022   GFRNONAA 48 (L) 02/09/2022   GFRAA 84 11/18/2019   NA 136 02/09/2022   K 4.3 02/09/2022   CALCIUM 8.6 (L) 02/09/2022   CO2 22 02/09/2022   GLUCOSE 139 (H) 02/09/2022    Lab Results  Component Value Date/Time   HGBA1C 13.1 (H) 01/14/2022 02:16 AM   HGBA1C 11.5 (A) 06/23/2021 10:42 AM   HGBA1C 10.5 (A) 12/13/2020 04:26 PM   HGBA1C 14.1 (H) 10/10/2018 03:18 PM    Last diabetic Eye exam:  Lab Results  Component Value Date/Time   HMDIABEYEEXA Retinopathy (A) 12/26/2020 12:00 AM    Last diabetic Foot  exam: No results found for: "HMDIABFOOTEX"   Lab Results  Component Value Date   CHOL 184 01/13/2022   HDL 53 01/13/2022   LDLCALC 115 (H) 01/13/2022   TRIG 82 01/13/2022   CHOLHDL 3.5 01/13/2022       Latest Ref Rng & Units 01/18/2022    5:42 AM 01/14/2022    2:16 AM 11/18/2019    4:07 PM  Hepatic Function   Total Protein 6.5 - 8.1 g/dL 5.9  6.3  7.2   Albumin 3.5 - 5.0 g/dL 2.7  2.8  4.0   AST 15 - 41 U/L _0 ALT 0 - 44 U/L _1 Alk Phosphatase 38 - 126 U/L 68  75  86   Total Bilirubin 0.3 - 1.2 mg/dL 1.0  0.5  0.3     Lab Results  Component Value Date/Time   TSH 1.590 11/18/2019 04:07 PM       Latest Ref Rng & Units 01/30/2022    5:38 AM 01/23/2022    6:04 AM 01/18/2022    5:42 AM  CBC  WBC 4.0 - 10.5 K/uL 8.8  7.8  8.2   Hemoglobin 12.0 - 15.0 g/dL 10.3  11.0  11.5   Hematocrit 36.0 - 46.0 % 32.9  34.1  35.6   Platelets 150 - 400 K/uL 195  196  169     Lab Results  Component Value Date/Time   VD25OH 24.03 (L) 01/18/2022 05:42 AM    Clinical ASCVD: Yes  The ASCVD Risk score (Arnett DK, et al., 2019) failed to calculate for the following reasons:   The patient has a prior MI or stroke diagnosis       12/13/2020    4:29 PM 10/10/2018    2:07 PM  Depression screen PHQ 2/9  Decreased Interest 0 0  Down, Depressed, Hopeless 0 0  PHQ - 2 Score 0 0  Altered sleeping 1   Tired, decreased energy 1   Change in appetite 0   Feeling bad or failure about yourself  0   Trouble concentrating 0   Moving slowly or fidgety/restless 0   Suicidal thoughts 0   PHQ-9 Score 2   Difficult doing work/chores Not difficult at all      Social History   Tobacco Use  Smoking Status Never  Smokeless Tobacco Never   BP Readings from Last 3 Encounters:  04/13/22 121/87  02/12/22 (!) 140/52  01/17/22 (!) 150/71   Pulse Readings from Last 3 Encounters:  04/13/22 67  02/12/22 67  01/17/22 87   Wt Readings from Last 3 Encounters:  04/13/22 276 lb (125.2 kg)  02/07/22 276 lb 7.3 oz (125.4 kg)  01/16/22 287 lb 14.7 oz (130.6 kg)   BMI Readings from Last 3 Encounters:  04/13/22 53.90 kg/m  02/07/22 53.99 kg/m  01/16/22 56.23 kg/m    Assessment/Interventions: Review of patient past medical history, allergies, medications, health status, including review of  consultants reports, laboratory and other test data, was performed as part of comprehensive evaluation and provision of chronic care management services.   SDOH:  (Social Determinants of Health) assessments and interventions performed: Yes   SDOH Screenings   Alcohol Screen: Low Risk  (12/13/2020)   Alcohol Screen    Last Alcohol Screening Score (AUDIT): 0  Depression (PHQ2-9): Low Risk  (12/13/2020)   Depression (PHQ2-9)    PHQ-2 Score: 2  Financial Resource Strain: Low Risk  (04/17/2022)  Overall Financial Resource Strain (CARDIA)    Difficulty of Paying Living Expenses: Not hard at all  Food Insecurity: Not on file  Housing: Not on file  Physical Activity: Not on file  Social Connections: Not on file  Stress: Not on file  Tobacco Use: Low Risk  (04/13/2022)   Patient History    Smoking Tobacco Use: Never    Smokeless Tobacco Use: Never    Passive Exposure: Not on file  Transportation Needs: No Transportation Needs (04/17/2022)   PRAPARE - Transportation    Lack of Transportation (Medical): No    Lack of Transportation (Non-Medical): No    CCM Care Plan  Allergies  Allergen Reactions   Penicillins Rash    Medications Reviewed Today     Reviewed by Germaine Pomfret, [facility] (Pharmacist) on 04/17/22 at 1352  Med List Status: <None>   Medication Order Taking? Sig Documenting Provider Last Dose Status Informant  Accu-Chek Softclix Lancets lancets [id number]  Use to test blood sugars 4 times daily. Cathlyn Parsons, PA-C  Active   acetaminophen (TYLENOL) 325 MG tablet [id number]  Take 2 tablets (650 mg total) by mouth every 4 (four) hours as needed for mild pain (or temp > 37.5 C (99.5 F)). Cathlyn Parsons, PA-C  Active   amLODipine (NORVASC) 10 MG tablet [id number] Yes Take 1 tablet (10 mg total) by mouth daily. [name], PA-C Taking Active   apixaban (ELIQUIS) 5 MG TABS tablet [id number] Yes Continue Eliquis 5 mg by mouth twice daily through 04/30/2022 and stop  Angiulli, Lavon Paganini, PA-C Taking Active   atorvastatin (LIPITOR) 80 MG tablet [id number] Yes Take 1 tablet (80 mg total) by mouth daily at 6 PM. [name], PA-C Taking Active   Baclofen 5 MG TABS [id number] Yes Take 5 mg by mouth at bedtime. [name], PA-C Taking Active   blood glucose meter kit and supplies [id number]  Dispense based on patient and insurance preference. Use up to four times daily as directed. (FOR ICD-10 E10.9, E11.9). Cathlyn Parsons, PA-C  Active   blood glucose meter kit and supplies [id number]  Dispense based on patient and insurance preference. Use up to four times daily as directed. (FOR ICD-10 E10.9, E11.9). Cathlyn Parsons, PA-C  Active   Blood Glucose Monitoring Suppl (BLOOD GLUCOSE MONITOR SYSTEM) w/Device KIT [id number]  Use to test blood sugars up to 4 times daily. Elizabeth Sauer  Active   Blood Pressure Monitoring DEVI [id number]  Check bp in AM and PM Drubel, Ria Comment, [place]  Active   escitalopram (LEXAPRO) 10 MG tablet [id number] Yes Take 1 tablet (10 mg total) by mouth daily. [name], PA-C Taking Active   glucose blood (ACCU-CHEK GUIDE) test strip [id number]  Use to test blood sugars 4 times daily. Cathlyn Parsons, PA-C  Active   Insulin Glargine (BASAGLAR KWIKPEN) 100 UNIT/ML [id number]  Inject 34 Units into the skin daily. [name], PA-C  Active   Insulin Pen Needle 32G X 4 MM MISC [id number]  Use to inject insulin up to 4 times daily. [name], PA-C  Active   melatonin 3 MG TABS tablet [id number] Yes Take 1 tablet (3 mg total) by mouth at bedtime as needed. Cathlyn Parsons, PA-C Taking Active   metoprolol tartrate (LOPRESSOR) 50 MG tablet [id number] Yes Take 1 tablet (50 mg total) by mouth 2 (two) times daily. [name], PA-C Taking Active   Vitamin D, Ergocalciferol, (DRISDOL) 1.25 MG (50000 UNIT) CAPS capsule [id number] Yes Take  1 capsule (50,000 Units total) by mouth every 7 (seven) days. Cathlyn Parsons, PA-C  Taking Active             Patient Active Problem List   Diagnosis Date Noted   Sinus tachycardia    Labile blood pressure    Uncontrolled type 2 diabetes mellitus with hyperglycemia (HCC)    CVA (cerebral vascular accident) ([place]) 01/13/2022   AKI (acute kidney injury) ([place]) 01/13/2022   Hyponatremia 01/13/2022   Elevated troponin 01/13/2022   Cerebrovascular accident (CVA) ([place]) 12/15/2020   Noncompliance with diabetes treatment 12/15/2020   Hyperlipidemia associated with type 2 diabetes mellitus ([place]) 08/11/2020   Type 2 diabetes mellitus with other specified complication ([place]) 75/91/6384   Hypertension 10/10/2018   Morbid obesity ([place]) 10/10/2018    Immunization History  Administered Date(s) Administered   PFIZER(Purple Top)SARS-COV-2 Vaccination 02/18/2020    Conditions to be addressed/monitored:  Hypertension, Hyperlipidemia, Diabetes, and History of CVA, History of DVT   There are no care plans that you recently modified to display for this patient.     Medication Assistance: None required.  Patient affirms current coverage meets needs.  Compliance/Adherence/Medication fill history: Care Gaps: Urine Microalbumin Tetanus/TDAP Colonoscopy Mammogram Zoster Vaccines COVID-19 Vaccine Booster 2 PAP Smear Diabetic Eye Exam PNA Vaccine Dexa Scan A1C > 9  Star-Rating Drugs: Atorvastatin 80 mg last filled on 03/01/2022 for a 30-Day supply with [facility]  Patient's preferred pharmacy is:  [facility] [address]), [place] [address] [place])  [address] Phone: [phone] Fax: [place] [address] [place] [address] Phone: [phone] Fax: [phone]  Uses pill box? Yes Pt endorses 100% compliance  We discussed: Current pharmacy is preferred with insurance plan and patient is satisfied with pharmacy services Patient decided to:  Continue current medication management strategy  Care Plan and Follow Up Patient Decision:  Patient agrees to Care Plan and Follow-up.  Plan: Telephone follow up appointment with care management team member scheduled for:  05/08/2022 at [place], PharmD, Para March, CPP  Clinical Pharmacist Practitioner  [facility] [phone]  Current Barriers:  Unable to achieve control of Stroke   Pharmacist Clinical Goal(s):  Patient will achieve control of stroke as evidenced by control of HTN, HLD and T2DM through collaboration with PharmD and provider.   Interventions: 1:1 collaboration with [name], PA-C regarding development and update of comprehensive plan of care as evidenced by provider attestation and co-signature Inter-disciplinary care team collaboration (see longitudinal plan of care) Comprehensive medication review performed; medication list updated in electronic medical record  Hypertension (BP goal <130/80) -Controlled -Current treatment: Amlodipine 10 mg daily  Metoprolol tartrate 50 mg twice daily  -Medications previously tried: NA  -Current home readings: Not monitoring regularly -Denies hypotensive/hypertensive symptoms -Recommended to continue current medication  Hyperlipidemia: (LDL goal < 70) -Uncontrolled -Current treatment: Atorvastatin 80 mg daily  -Medications previously tried: NA  -Weakness in legs, arms. Occupational therapy x1 weekly. PT 2x weekly.  -Recommended to continue current medication  Diabetes (A1c goal <8%) -Uncontrolled -Diagnosed: 6 months prior  -Current medications: Basaglar 34 units daily (0.27 u/kg) -Medications previously tried: Glimepiride, Kelle Darting  -Current home glucose readings  Fasting  8-Jun 139  7-Jun 222  6-Jun 134  5-Jun 154  4-Jun 167  3-Jun 168  2-Jun 168  1-Jun 204  31-May 106  Average 162  -Denies hypoglycemic/hyperglycemic symptoms -Discussed the  need for reducing stroke  risk, recommend initiation of GLP-1 agonist. Patient's husband was amenable but patient was worried starting it might "mess up her sugars." We discussed at length the efficacy and safety of the new medication but she was still skeptical and wanted to think it over.  -Recommended to continue current medication  Depression/Anxiety (Goal: Achieve symptom remission) -Controlled -Current treatment: Escitalopram 10 mg daily  Melatonin 3 mg nightly  -Medications previously tried/failed: NA -PHQ9: 2 -Recommended to continue current medication  DVT (Goal: Prevent clots) -Controlled -Current treatment  Eliquis 5 mg twice daily  -Medications previously tried: NA  continue Eliquis until 04/30/2021 -Recommended to continue current medication  Patient Goals/Self-Care Activities Patient will:  - check glucose daily before breakfast, document, and provide at future appointments check blood pressure 2-3 times weekly, document, and provide at future appointments  Follow Up Plan: Telephone follow up appointment with care management team member scheduled for:  05/08/2022 at 9:15 AM

## 2022-05-26 ENCOUNTER — Encounter: Payer: Self-pay | Admitting: Physician Assistant

## 2022-05-29 NOTE — Telephone Encounter (Signed)
Caller states never received a call for 05/21/2022 [facility] CCM PHARMACIST telephone call. Caller would like a phone call today to [facility] appointment.

## 2022-06-11 ENCOUNTER — Other Ambulatory Visit: Payer: Self-pay | Admitting: Physician Assistant

## 2022-06-11 DIAGNOSIS — I63513 Cerebral infarction due to unspecified occlusion or stenosis of bilateral middle cerebral arteries: Secondary | ICD-10-CM

## 2022-06-14 ENCOUNTER — Other Ambulatory Visit: Payer: Self-pay | Admitting: Physician Assistant

## 2022-06-14 DIAGNOSIS — I63513 Cerebral infarction due to unspecified occlusion or stenosis of bilateral middle cerebral arteries: Secondary | ICD-10-CM

## 2022-06-19 ENCOUNTER — Telehealth: Payer: Medicare Other

## 2022-06-26 ENCOUNTER — Ambulatory Visit (INDEPENDENT_AMBULATORY_CARE_PROVIDER_SITE_OTHER): Payer: Medicare HMO

## 2022-06-26 ENCOUNTER — Telehealth: Payer: Self-pay

## 2022-06-26 DIAGNOSIS — Z794 Long term (current) use of insulin: Secondary | ICD-10-CM

## 2022-06-26 DIAGNOSIS — E1169 Type 2 diabetes mellitus with other specified complication: Secondary | ICD-10-CM

## 2022-06-26 NOTE — Progress Notes (Addendum)
    Chronic Care Management Pharmacy Assistant   Name: Samantha [name]  MRN: [id number] DOB: 1956-07-09  Patient called to be reminded of her telephone appointment with Angelena Sole, CPP on 06/26/2022 @ 1530.  No answer, left message of appointment date, time and type of appointment (either telephone or in person). Left message to have all medications, supplements, blood pressure and/or blood sugar logs available during appointment and to return call if need to reschedule.   Star Rating Drug: Atorvastatin 80 mg last filled on 06/08/2022 for a 30-Day supply at [facility]  Any gaps in medications fill history? No  Care Gaps: Urine Microalbumin Tetanus/TDAP Colonoscopy Mammogram Zoster Vaccine Pap Smear Diabetic Eye Exam PNA  Vaccine Dexa Scan  Influenza Vaccine Diabetic Foot Exam   Samantha [name], CPA/CMA Clinical Pharmacist Assistant Phone: [phone]

## 2022-06-26 NOTE — Progress Notes (Signed)
Chronic Care Management Pharmacy Note  06/28/2022 Name:  Samantha [name] MRN:  [id number] DOB:  1956/01/28  Summary: Patient presents for CCM follow-up. Today's visit was conducted 100% with her husband, Samantha [name] who helps her manage her medications.  Imodium + alka seltzer for constipation  Only giving insulin PRN   Recommendations/Changes made from today's visit: DECREASE basalgar to 16 units but take daily  Stop Imodium + alka seltzer  Miralax + increase water intake  Needs PCP follow-up ASAP.   Plan: CPP follow-up one month  Subjective: Samantha [name] is an 66 y.o. year old female who is a primary patient of Thedore Mins, Ria Comment, [place].  The CCM team was consulted for assistance with disease management and care coordination needs.    Engaged with patient by telephone for initial visit in response to provider referral for pharmacy case management and/or care coordination services.   Consent to Services:  The patient was given the following information about Chronic Care Management services today, agreed to services, and gave verbal consent: 1. CCM service includes personalized support from designated clinical staff supervised by the primary care provider, including individualized plan of care and coordination with other care providers 2. 24/7 contact phone numbers for assistance for urgent and routine care needs. 3. Service will only be billed when office clinical staff spend 20 minutes or more in a month to coordinate care. 4. Only one practitioner may furnish and bill the service in a calendar month. 5.The patient may stop CCM services at any time (effective at the end of the month) by phone call to the office staff. 6. The patient will be responsible for cost sharing (co-pay) of up to 20% of the service fee (after annual deductible is met). Patient agreed to services and consent obtained.  Patient Care Team: [name], PA-C as PCP - General (Physician Assistant) Germaine Pomfret,  [facility] (Pharmacist)  Recent office visits: 03/28/2022 [name], PA-C (PCP Office Visit for Upper Respiratory Tract Infection-Started: Benzonatate 100 mg 3 times daily, No orders placed no follow-up noted   03/01/2022 [name], PA-C (PCP Video Visit) for HTN- No Medication Changes noted, Neurology Referral placed, Cardiology Referral placed, DME order placed for a 4 wheeled rolling walker, Return in 4 weeks  Recent consult visits: 03/28/2022 Hemang Leeanne Deed, MD (Neurology) for CVA- No medication changes noted, No orders placed, No follow-up noted  Hospital visits: Admitted to the hospital on 01/17/2022 due to CVA. Discharge date was 02/13/2022. Discharged from [facility].    Admitted to the hospital on 01/13/2022 due to CVA. Discharge date was 01/17/2022. Discharged from [facility].    Objective:  Lab Results  Component Value Date   CREATININE 1.26 (H) 02/09/2022   BUN 33 (H) 02/09/2022   GFRNONAA 48 (L) 02/09/2022   GFRAA 84 11/18/2019   NA 136 02/09/2022   K 4.3 02/09/2022   CALCIUM 8.6 (L) 02/09/2022   CO2 22 02/09/2022   GLUCOSE 139 (H) 02/09/2022    Lab Results  Component Value Date/Time   HGBA1C 13.1 (H) 01/14/2022 02:16 AM   HGBA1C 11.5 (A) 06/23/2021 10:42 AM   HGBA1C 10.5 (A) 12/13/2020 04:26 PM   HGBA1C 14.1 (H) 10/10/2018 03:18 PM    Last diabetic Eye exam:  Lab Results  Component Value Date/Time   HMDIABEYEEXA Retinopathy (A) 12/26/2020 12:00 AM    Last diabetic Foot exam: No results found for: "HMDIABFOOTEX"   Lab Results  Component Value Date   CHOL 184 01/13/2022  HDL 53 01/13/2022   LDLCALC 115 (H) 01/13/2022   TRIG 82 01/13/2022   CHOLHDL 3.5 01/13/2022       Latest Ref Rng & Units 01/18/2022    5:42 AM 01/14/2022    2:16 AM 11/18/2019    4:07 PM  Hepatic Function  Total Protein 6.5 - 8.1 g/dL 5.9  6.3  7.2   Albumin 3.5 - 5.0 g/dL 2.7  2.8  4.0   AST 15 - 41 U/L _0 ALT 0 - 44  U/L _1 Alk Phosphatase 38 - 126 U/L 68  75  86   Total Bilirubin 0.3 - 1.2 mg/dL 1.0  0.5  0.3     Lab Results  Component Value Date/Time   TSH 1.590 11/18/2019 04:07 PM       Latest Ref Rng & Units 01/30/2022    5:38 AM 01/23/2022    6:04 AM 01/18/2022    5:42 AM  CBC  WBC 4.0 - 10.5 K/uL 8.8  7.8  8.2   Hemoglobin 12.0 - 15.0 g/dL 10.3  11.0  11.5   Hematocrit 36.0 - 46.0 % 32.9  34.1  35.6   Platelets 150 - 400 K/uL 195  196  169     Lab Results  Component Value Date/Time   VD25OH 24.03 (L) 01/18/2022 05:42 AM    Clinical ASCVD: Yes  The ASCVD Risk score (Arnett DK, et al., 2019) failed to calculate for the following reasons:   The patient has a prior MI or stroke diagnosis       12/13/2020    4:29 PM 10/10/2018    2:07 PM  Depression screen PHQ 2/9  Decreased Interest 0 0  Down, Depressed, Hopeless 0 0  PHQ - 2 Score 0 0  Altered sleeping 1   Tired, decreased energy 1   Change in appetite 0   Feeling bad or failure about yourself  0   Trouble concentrating 0   Moving slowly or fidgety/restless 0   Suicidal thoughts 0   PHQ-9 Score 2   Difficult doing work/chores Not difficult at all      Social History   Tobacco Use  Smoking Status Never  Smokeless Tobacco Never   BP Readings from Last 3 Encounters:  04/13/22 121/87  02/12/22 (!) 140/52  01/17/22 (!) 150/71   Pulse Readings from Last 3 Encounters:  04/13/22 67  02/12/22 67  01/17/22 87   Wt Readings from Last 3 Encounters:  04/13/22 276 lb (125.2 kg)  02/07/22 276 lb 7.3 oz (125.4 kg)  01/16/22 287 lb 14.7 oz (130.6 kg)   BMI Readings from Last 3 Encounters:  04/13/22 53.90 kg/m  02/07/22 53.99 kg/m  01/16/22 56.23 kg/m    Assessment/Interventions: Review of patient past medical history, allergies, medications, health status, including review of consultants reports, laboratory and other test data, was performed as part of comprehensive evaluation and provision of chronic care  management services.   SDOH:  (Social Determinants of Health) assessments and interventions performed: Yes   SDOH Screenings   Alcohol Screen: Low Risk  (12/13/2020)   Alcohol Screen    Last Alcohol Screening Score (AUDIT): 0  Depression (PHQ2-9): Low Risk  (12/13/2020)   Depression (PHQ2-9)    PHQ-2 Score: 2  Financial Resource Strain: Low Risk  (04/17/2022)   Overall Financial Resource Strain (CARDIA)    Difficulty of Paying Living Expenses: Not hard at all  Food Insecurity:  Not on file  Housing: Not on file  Physical Activity: Not on file  Social Connections: Not on file  Stress: Not on file  Tobacco Use: Low Risk  (04/13/2022)   Patient History    Smoking Tobacco Use: Never    Smokeless Tobacco Use: Never    Passive Exposure: Not on file  Transportation Needs: No Transportation Needs (04/17/2022)   PRAPARE - Transportation    Lack of Transportation (Medical): No    Lack of Transportation (Non-Medical): No    CCM Care Plan  Allergies  Allergen Reactions   Penicillins Rash    Medications Reviewed Today     Reviewed by Germaine Pomfret, [facility] (Pharmacist) on 04/17/22 at 1352  Med List Status: <None>   Medication Order Taking? Sig Documenting Provider Last Dose Status Informant  Accu-Chek Softclix Lancets lancets [id number]  Use to test blood sugars 4 times daily. Cathlyn Parsons, PA-C  Active   acetaminophen (TYLENOL) 325 MG tablet [id number]  Take 2 tablets (650 mg total) by mouth every 4 (four) hours as needed for mild pain (or temp > 37.5 C (99.5 F)). Cathlyn Parsons, PA-C  Active   amLODipine (NORVASC) 10 MG tablet [id number] Yes Take 1 tablet (10 mg total) by mouth daily. [name], PA-C Taking Active   apixaban (ELIQUIS) 5 MG TABS tablet [id number] Yes Continue Eliquis 5 mg by mouth twice daily through 04/30/2022 and stop Angiulli, Lavon Paganini, PA-C Taking Active   atorvastatin (LIPITOR) 80 MG tablet [id number] Yes Take 1 tablet (80 mg total) by mouth daily at 6  PM. [name], PA-C Taking Active   Baclofen 5 MG TABS [id number] Yes Take 5 mg by mouth at bedtime. [name], PA-C Taking Active   blood glucose meter kit and supplies [id number]  Dispense based on patient and insurance preference. Use up to four times daily as directed. (FOR ICD-10 E10.9, E11.9). Cathlyn Parsons, PA-C  Active   blood glucose meter kit and supplies [id number]  Dispense based on patient and insurance preference. Use up to four times daily as directed. (FOR ICD-10 E10.9, E11.9). Cathlyn Parsons, PA-C  Active   Blood Glucose Monitoring Suppl (BLOOD GLUCOSE MONITOR SYSTEM) w/Device KIT [id number]  Use to test blood sugars up to 4 times daily. Elizabeth Sauer  Active   Blood Pressure Monitoring DEVI [id number]  Check bp in AM and PM Drubel, Ria Comment, [place]  Active   escitalopram (LEXAPRO) 10 MG tablet [id number] Yes Take 1 tablet (10 mg total) by mouth daily. [name], PA-C Taking Active   glucose blood (ACCU-CHEK GUIDE) test strip [id number]  Use to test blood sugars 4 times daily. Cathlyn Parsons, PA-C  Active   Insulin Glargine (BASAGLAR KWIKPEN) 100 UNIT/ML [id number]  Inject 34 Units into the skin daily. [name], PA-C  Active   Insulin Pen Needle 32G X 4 MM MISC [id number]  Use to inject insulin up to 4 times daily. [name], PA-C  Active   melatonin 3 MG TABS tablet [id number] Yes Take 1 tablet (3 mg total) by mouth at bedtime as needed. Cathlyn Parsons, PA-C Taking Active   metoprolol tartrate (LOPRESSOR) 50 MG tablet [id number] Yes Take 1 tablet (50 mg total) by mouth 2 (two) times daily. [name], PA-C Taking Active   Vitamin D, Ergocalciferol, (DRISDOL) 1.25 MG (50000 UNIT) CAPS capsule [id number] Yes Take 1 capsule (50,000 Units total) by mouth every 7 (seven) days. Cathlyn Parsons, PA-C Taking Active  Patient Active Problem List   Diagnosis Date Noted   Sinus tachycardia    Labile blood pressure     Uncontrolled type 2 diabetes mellitus with hyperglycemia (HCC)    CVA (cerebral vascular accident) ([place]) 01/13/2022   AKI (acute kidney injury) ([place]) 01/13/2022   Hyponatremia 01/13/2022   Elevated troponin 01/13/2022   Cerebrovascular accident (CVA) ([place]) 12/15/2020   Noncompliance with diabetes treatment 12/15/2020   Hyperlipidemia associated with type 2 diabetes mellitus ([place]) 08/11/2020   Type 2 diabetes mellitus with other specified complication ([place]) 16/57/9038   Hypertension 10/10/2018   Morbid obesity ([place]) 10/10/2018    Immunization History  Administered Date(s) Administered   PFIZER(Purple Top)SARS-COV-2 Vaccination 02/18/2020    Conditions to be addressed/monitored:  Hypertension, Hyperlipidemia, Diabetes, and History of CVA, History of DVT   Care Plan : General Pharmacy (Adult)  Updates made by Germaine Pomfret, RPH since 06/28/2022 12:00 AM     Problem: Hypertension, Hyperlipidemia, Diabetes, and History of CVA, History of DVT   Priority: High     Long-Range Goal: Patient-Specific Goal   Start Date: 04/17/2022  Expected End Date: 04/18/2023  This Visit's Progress: On track  Recent Progress: On track  Priority: High  Note:   Current Barriers:  Unable to achieve control of Stroke   Pharmacist Clinical Goal(s):  Patient will achieve control of stroke as evidenced by control of HTN, HLD and T2DM through collaboration with PharmD and provider.   Interventions: 1:1 collaboration with [name], PA-C regarding development and update of comprehensive plan of care as evidenced by provider attestation and co-signature Inter-disciplinary care team collaboration (see longitudinal plan of care) Comprehensive medication review performed; medication list updated in electronic medical record  Hypertension (BP goal <130/80) -Controlled -Current treatment: Amlodipine 10 mg daily  Metoprolol tartrate 50 mg twice daily  -Medications previously tried: NA  -Current  home readings: Not monitoring regularly -Denies hypotensive/hypertensive symptoms -Recommended to continue current medication  Hyperlipidemia: (LDL goal < 70) -Uncontrolled -Current treatment: Atorvastatin 80 mg daily  -Medications previously tried: NA  -Weakness in legs, arms. Occupational therapy x1 weekly. PT 2x weekly.  -Recommended to continue current medication  Diabetes (A1c goal <8%) -Uncontrolled -Diagnosed: 6 months prior  -Current medications: Basaglar 34 units daily (0.27 u/kg) Only administers if BG > 160,  has not administered in couple of weeks,  -Medications previously tried: Glimepiride, Farxiga, Januvia  -Current home glucose readings Before supper: 156, 204, 177, 150, 157, 113, 138,   Fasting  8-Jun 139  7-Jun 222  6-Jun 134  5-Jun 154  4-Jun 167  3-Jun 168  2-Jun 168  1-Jun 204  31-May 106  Average 162  -Denies hypoglycemic/hyperglycemic symptoms -Discussed the need for reducing stroke risk, recommend initiation of GLP-1 agonist. Patient's husband was amenable but patient was worried starting it might "mess up her sugars." We discussed at length the efficacy and safety of the new medication but she was still skeptical and wanted to think it over.  -Recommended to continue current medication  Depression/Anxiety (Goal: Achieve symptom remission) -Controlled -Current treatment: Escitalopram 10 mg daily  Melatonin 3 mg nightly  -Medications previously tried/failed: NA -PHQ9: 2 -Recommended to continue current medication  DVT (Goal: Prevent clots) -Controlled -Current treatment  Eliquis 5 mg twice daily  -Medications previously tried: NA  continue Eliquis until 04/30/2021 -Recommended to continue current medication  Patient Goals/Self-Care Activities Patient will:  - check glucose daily before breakfast, document, and provide at future appointments check blood pressure 2-3 times weekly,  document, and provide at future appointments  Follow Up  Plan: Telephone follow up appointment with care management team member scheduled for:  07/31/2022 at 3:30 PM     Medication Assistance: None required.  Patient affirms current coverage meets needs.  Compliance/Adherence/Medication fill history: Care Gaps: Urine Microalbumin Tetanus/TDAP Colonoscopy Mammogram Zoster Vaccines COVID-19 Vaccine Booster 2 PAP Smear Diabetic Eye Exam PNA Vaccine Dexa Scan A1C > 9  Star-Rating Drugs: Atorvastatin 80 mg last filled on 03/01/2022 for a 30-Day supply with [facility]  Patient's preferred pharmacy is:  [facility] [address] [address] [place]) [place] [address] Phone: [phone] Fax: [place] [address] [place] [address] Phone: [phone] Fax: [phone]  Uses pill box? Yes Pt endorses 100% compliance  We discussed: Current pharmacy is preferred with insurance plan and patient is satisfied with pharmacy services Patient decided to: Continue current medication management strategy  Care Plan and Follow Up Patient Decision:  Patient agrees to Care Plan and Follow-up.  Plan: Telephone follow up appointment with care management team member scheduled for:  07/31/2022 at 3:30 PM  Junius Argyle, PharmD, Para [place], [place] [phone]

## 2022-06-28 DIAGNOSIS — R69 Illness, unspecified: Secondary | ICD-10-CM | POA: Diagnosis not present

## 2022-06-28 DIAGNOSIS — I10 Essential (primary) hypertension: Secondary | ICD-10-CM

## 2022-06-28 DIAGNOSIS — E1169 Type 2 diabetes mellitus with other specified complication: Secondary | ICD-10-CM | POA: Diagnosis not present

## 2022-06-28 DIAGNOSIS — F32A Depression, unspecified: Secondary | ICD-10-CM

## 2022-06-28 DIAGNOSIS — Z794 Long term (current) use of insulin: Secondary | ICD-10-CM | POA: Diagnosis not present

## 2022-06-28 DIAGNOSIS — E785 Hyperlipidemia, unspecified: Secondary | ICD-10-CM

## 2022-06-28 NOTE — Patient Instructions (Signed)
Visit Information It was great speaking with you today!  Please let me know if you have any questions about our visit.  Patient Care Plan: General Pharmacy (Adult)     Problem Identified: Hypertension, Hyperlipidemia, Diabetes, and History of CVA, History of DVT   Priority: High     Long-Range Goal: Patient-Specific Goal   Start Date: 04/17/2022  Expected End Date: 04/18/2023  This Visit's Progress: On track  Recent Progress: On track  Priority: High  Note:   Current Barriers:  Unable to achieve control of Stroke   Pharmacist Clinical Goal(s):  Patient will achieve control of stroke as evidenced by control of HTN, HLD and T2DM through collaboration with PharmD and provider.   Interventions: 1:1 collaboration with [name], PA-C regarding development and update of comprehensive plan of care as evidenced by provider attestation and co-signature Inter-disciplinary care team collaboration (see longitudinal plan of care) Comprehensive medication review performed; medication list updated in electronic medical record  Hypertension (BP goal <130/80) -Controlled -Current treatment: Amlodipine 10 mg daily  Metoprolol tartrate 50 mg twice daily  -Medications previously tried: NA  -Current home readings: Not monitoring regularly -Denies hypotensive/hypertensive symptoms -Recommended to continue current medication  Hyperlipidemia: (LDL goal < 70) -Uncontrolled -Current treatment: Atorvastatin 80 mg daily  -Medications previously tried: NA  -Weakness in legs, arms. Occupational therapy x1 weekly. PT 2x weekly.  -Recommended to continue current medication  Diabetes (A1c goal <8%) -Uncontrolled -Diagnosed: 6 months prior  -Current medications: Basaglar 34 units daily (0.27 u/kg) Only administers if BG > 160,  has not administered in couple of weeks,  -Medications previously tried: Glimepiride, Farxiga, Januvia  -Current home glucose readings Before supper: 156, 204, 177,  150, 157, 113, 138,   Fasting  8-Jun 139  7-Jun 222  6-Jun 134  5-Jun 154  4-Jun 167  3-Jun 168  2-Jun 168  1-Jun 204  31-May 106  Average 162  -Denies hypoglycemic/hyperglycemic symptoms -Discussed the need for reducing stroke risk, recommend initiation of GLP-1 agonist. Patient's husband was amenable but patient was worried starting it might "mess up her sugars." We discussed at length the efficacy and safety of the new medication but she was still skeptical and wanted to think it over.  -Recommended to continue current medication  Depression/Anxiety (Goal: Achieve symptom remission) -Controlled -Current treatment: Escitalopram 10 mg daily  Melatonin 3 mg nightly  -Medications previously tried/failed: NA -PHQ9: 2 -Recommended to continue current medication  DVT (Goal: Prevent clots) -Controlled -Current treatment  Eliquis 5 mg twice daily  -Medications previously tried: NA  continue Eliquis until 04/30/2021 -Recommended to continue current medication  Patient Goals/Self-Care Activities Patient will:  - check glucose daily before breakfast, document, and provide at future appointments check blood pressure 2-3 times weekly, document, and provide at future appointments  Follow Up Plan: Telephone follow up appointment with care management team member scheduled for:  07/31/2022 at 3:30 PM      Patient agreed to services and verbal consent obtained.   Patient verbalizes understanding of instructions and care plan provided today and agrees to view in MyChart. Active MyChart status and patient understanding of how to access instructions and care plan via MyChart confirmed with patient.     Angelena Sole, PharmD, [place]y [place], CPP  Clinical Pharmacist Practitioner  [facility] [phone]

## 2022-06-29 DIAGNOSIS — E785 Hyperlipidemia, unspecified: Secondary | ICD-10-CM | POA: Diagnosis not present

## 2022-06-29 DIAGNOSIS — I69351 Hemiplegia and hemiparesis following cerebral infarction affecting right dominant side: Secondary | ICD-10-CM | POA: Diagnosis not present

## 2022-06-29 DIAGNOSIS — Z833 Family history of diabetes mellitus: Secondary | ICD-10-CM | POA: Diagnosis not present

## 2022-06-29 DIAGNOSIS — R69 Illness, unspecified: Secondary | ICD-10-CM | POA: Diagnosis not present

## 2022-06-29 DIAGNOSIS — Z880 Allergy status to penicillin: Secondary | ICD-10-CM | POA: Diagnosis not present

## 2022-06-29 DIAGNOSIS — E119 Type 2 diabetes mellitus without complications: Secondary | ICD-10-CM | POA: Diagnosis not present

## 2022-06-29 DIAGNOSIS — I10 Essential (primary) hypertension: Secondary | ICD-10-CM | POA: Diagnosis not present

## 2022-06-29 DIAGNOSIS — Z7401 Bed confinement status: Secondary | ICD-10-CM | POA: Diagnosis not present

## 2022-06-29 DIAGNOSIS — Z794 Long term (current) use of insulin: Secondary | ICD-10-CM | POA: Diagnosis not present

## 2022-06-29 DIAGNOSIS — Z7901 Long term (current) use of anticoagulants: Secondary | ICD-10-CM | POA: Diagnosis not present

## 2022-07-09 ENCOUNTER — Other Ambulatory Visit: Payer: Self-pay | Admitting: Physician Assistant

## 2022-07-09 DIAGNOSIS — I63513 Cerebral infarction due to unspecified occlusion or stenosis of bilateral middle cerebral arteries: Secondary | ICD-10-CM

## 2022-07-14 ENCOUNTER — Other Ambulatory Visit: Payer: Self-pay | Admitting: Physician Assistant

## 2022-07-14 DIAGNOSIS — I63513 Cerebral infarction due to unspecified occlusion or stenosis of bilateral middle cerebral arteries: Secondary | ICD-10-CM

## 2022-07-30 ENCOUNTER — Encounter: Payer: 59 | Attending: Physical Medicine and Rehabilitation | Admitting: Physical Medicine and Rehabilitation

## 2022-07-31 ENCOUNTER — Telehealth: Payer: 59

## 2022-08-09 ENCOUNTER — Other Ambulatory Visit: Payer: Self-pay

## 2022-08-09 ENCOUNTER — Other Ambulatory Visit (HOSPITAL_COMMUNITY): Payer: Self-pay

## 2022-08-13 ENCOUNTER — Ambulatory Visit: Payer: 59

## 2022-08-13 DIAGNOSIS — E1169 Type 2 diabetes mellitus with other specified complication: Secondary | ICD-10-CM | POA: Diagnosis not present

## 2022-08-13 DIAGNOSIS — E1165 Type 2 diabetes mellitus with hyperglycemia: Secondary | ICD-10-CM | POA: Diagnosis not present

## 2022-08-13 DIAGNOSIS — I10 Essential (primary) hypertension: Secondary | ICD-10-CM

## 2022-08-13 DIAGNOSIS — I63513 Cerebral infarction due to unspecified occlusion or stenosis of bilateral middle cerebral arteries: Secondary | ICD-10-CM

## 2022-08-13 DIAGNOSIS — E785 Hyperlipidemia, unspecified: Secondary | ICD-10-CM | POA: Diagnosis not present

## 2022-08-13 MED ORDER — APIXABAN 5 MG PO TABS: 5.0000 mg | ORAL_TABLET | Freq: Two times a day (BID) | ORAL | 3 refills | Status: DC

## 2022-08-13 MED ORDER — METOPROLOL TARTRATE 50 MG PO TABS: 50.0000 mg | ORAL_TABLET | Freq: Two times a day (BID) | ORAL | 1 refills | Status: AC

## 2022-08-13 MED ORDER — ATORVASTATIN CALCIUM 80 MG PO TABS: 80.0000 mg | ORAL_TABLET | Freq: Every day | ORAL | 1 refills | Status: AC

## 2022-08-13 MED ORDER — ESCITALOPRAM OXALATE 10 MG PO TABS: 10.0000 mg | ORAL_TABLET | Freq: Every day | ORAL | 1 refills | Status: AC

## 2022-08-13 MED ORDER — BASAGLAR KWIKPEN 100 UNIT/ML ~~LOC~~ SOPN: 16.0000 [IU] | PEN_INJECTOR | Freq: Every day | SUBCUTANEOUS | 1 refills | Status: AC

## 2022-08-13 MED ORDER — BACLOFEN 5 MG PO TABS: 5.0000 mg | ORAL_TABLET | Freq: Every evening | ORAL | 1 refills | Status: AC

## 2022-08-13 MED ORDER — AMLODIPINE BESYLATE 10 MG PO TABS: 10.0000 mg | ORAL_TABLET | Freq: Every day | ORAL | 1 refills | Status: DC

## 2022-08-13 MED ORDER — INSULIN PEN NEEDLE 32G X 4 MM MISC: 30.0000 | Freq: Every day | 4 refills | Status: AC

## 2022-08-13 NOTE — Patient Instructions (Addendum)
Visit Information It was great speaking with you today!  Please let me know if you have any questions about our visit.  Plan:  STOP melatonin  Begin using [facility].   Print copy of patient instructions, educational materials, and care plan provided in person.   Junius Argyle, PharmD, Para [place], CPP  Clinical Pharmacist Practitioner  [facility] [place], [place] Pharmacist Assistant Phone: [phone]

## 2022-08-13 NOTE — Progress Notes (Signed)
Chronic Care Management Pharmacy Note  08/13/2022 Name:  Samantha [name] MRN:  [id number] DOB:  1956/04/24  Summary: Patient presents for CCM follow-up. Today's visit was conducted 100% with her husband, Samantha [name] who helps her manage her medications.  -Patient only taking metoprolol once daily, advised on importance of twice daily. Patient crushes all tablets so would not be able to switch to metoprolol succinate.   -Patient started on Eliquis due to DVT, notes state to continue until 04/30/2021. Does not appear to be dressed since then.   Recommendations/Changes made from today's visit: -Recommend stopping Eliquis at this point due to lack of evidence with continued use, starting aspirin 81 mg daily. Will discuss with PCP after labwork/ physical.  -Recheck aspirin today.   Plan: CPP follow-up one month Patient to schedule physical ASAP.   Subjective: Samantha [name] is an 66 y.o. year old female who is a primary patient of Samantha [name], Ria Comment, [place].  The CCM team was consulted for assistance with disease management and care coordination needs.    Engaged with patient by telephone for follow up visit in response to provider referral for pharmacy case management and/or care coordination services.   Consent to Services:  The patient was given information about Chronic Care Management services, agreed to services, and gave verbal consent prior to initiation of services.  Please see initial visit note for detailed documentation.   Patient Care Team: [name], PA-C as PCP - General (Physician Assistant) Germaine Pomfret, [facility] (Pharmacist)  Recent office visits: 03/28/2022 [name], PA-C (PCP Office Visit for Upper Respiratory Tract Infection-Started: Benzonatate 100 mg 3 times daily, No orders placed no follow-up noted   03/01/2022 [name], PA-C (PCP Video Visit) for HTN- No Medication Changes noted, Neurology Referral placed, Cardiology Referral placed, DME order placed for  a 4 wheeled rolling walker, Return in 4 weeks  Recent consult visits: 03/28/2022 Hemang Leeanne Deed, MD (Neurology) for CVA- No medication changes noted, No orders placed, No follow-up noted  Hospital visits: Admitted to the hospital on 01/17/2022 due to CVA. Discharge date was 02/13/2022. Discharged from [facility].    Admitted to the hospital on 01/13/2022 due to CVA. Discharge date was 01/17/2022. Discharged from [facility].    Objective:  Lab Results  Component Value Date   CREATININE 1.26 (H) 02/09/2022   BUN 33 (H) 02/09/2022   GFRNONAA 48 (L) 02/09/2022   GFRAA 84 11/18/2019   NA 136 02/09/2022   K 4.3 02/09/2022   CALCIUM 8.6 (L) 02/09/2022   CO2 22 02/09/2022   GLUCOSE 139 (H) 02/09/2022    Lab Results  Component Value Date/Time   HGBA1C 13.1 (H) 01/14/2022 02:16 AM   HGBA1C 11.5 (A) 06/23/2021 10:42 AM   HGBA1C 10.5 (A) 12/13/2020 04:26 PM   HGBA1C 14.1 (H) 10/10/2018 03:18 PM    Last diabetic Eye exam:  Lab Results  Component Value Date/Time   HMDIABEYEEXA Retinopathy (A) 12/26/2020 12:00 AM    Last diabetic Foot exam: No results found for: "HMDIABFOOTEX"   Lab Results  Component Value Date   CHOL 184 01/13/2022   HDL 53 01/13/2022   LDLCALC 115 (H) 01/13/2022   TRIG 82 01/13/2022   CHOLHDL 3.5 01/13/2022       Latest Ref Rng & Units 01/18/2022    5:42 AM 01/14/2022    2:16 AM 11/18/2019    4:07 PM  Hepatic Function  Total Protein 6.5 - 8.1 g/dL 5.9  6.3  7.2  Albumin 3.5 - 5.0 g/dL 2.7  2.8  4.0   AST 15 - 41 U/L '24  16  16   ' ALT 0 - 44 U/L '21  16  10   ' Alk Phosphatase 38 - 126 U/L 68  75  86   Total Bilirubin 0.3 - 1.2 mg/dL 1.0  0.5  0.3     Lab Results  Component Value Date/Time   TSH 1.590 11/18/2019 04:07 PM       Latest Ref Rng & Units 01/30/2022    5:38 AM 01/23/2022    6:04 AM 01/18/2022    5:42 AM  CBC  WBC 4.0 - 10.5 K/uL 8.8  7.8  8.2   Hemoglobin 12.0 - 15.0 g/dL 10.3  11.0  11.5    Hematocrit 36.0 - 46.0 % 32.9  34.1  35.6   Platelets 150 - 400 K/uL 195  196  169     Lab Results  Component Value Date/Time   VD25OH 24.03 (L) 01/18/2022 05:42 AM    Clinical ASCVD: Yes  The ASCVD Risk score (Arnett DK, et al., 2019) failed to calculate for the following reasons:   The patient has a prior MI or stroke diagnosis       12/13/2020    4:29 PM 10/10/2018    2:07 PM  Depression screen PHQ 2/9  Decreased Interest 0 0  Down, Depressed, Hopeless 0 0  PHQ - 2 Score 0 0  Altered sleeping 1   Tired, decreased energy 1   Change in appetite 0   Feeling bad or failure about yourself  0   Trouble concentrating 0   Moving slowly or fidgety/restless 0   Suicidal thoughts 0   PHQ-9 Score 2   Difficult doing work/chores Not difficult at all      Social History   Tobacco Use  Smoking Status Never  Smokeless Tobacco Never   BP Readings from Last 3 Encounters:  04/13/22 121/87  02/12/22 (!) 140/52  01/17/22 (!) 150/71   Pulse Readings from Last 3 Encounters:  04/13/22 67  02/12/22 67  01/17/22 87   Wt Readings from Last 3 Encounters:  04/13/22 276 lb (125.2 kg)  02/07/22 276 lb 7.3 oz (125.4 kg)  01/16/22 287 lb 14.7 oz (130.6 kg)   BMI Readings from Last 3 Encounters:  04/13/22 53.90 kg/m  02/07/22 53.99 kg/m  01/16/22 56.23 kg/m    Assessment/Interventions: Review of patient past medical history, allergies, medications, health status, including review of consultants reports, laboratory and other test data, was performed as part of comprehensive evaluation and provision of chronic care management services.   SDOH:  (Social Determinants of Health) assessments and interventions performed: Yes SDOH Interventions    Flowsheet Row Chronic Care Management from 04/06/2022 in [place] Visit from 12/13/2020 in [place] Interventions    Transportation Interventions Intervention Not Indicated --  Depression  Interventions/Treatment  -- PHQ2-9 Score <4 Follow-up Not Indicated  Financial Strain Interventions Intervention Not Indicated --       SDOH Screenings   Transportation Needs: No Transportation Needs (04/17/2022)  Alcohol Screen: Low Risk  (12/13/2020)  Depression (PHQ2-9): Low Risk  (12/13/2020)  Financial Resource Strain: Low Risk  (04/17/2022)  Tobacco Use: Low Risk  (04/13/2022)    [place]  Allergies  Allergen Reactions   Penicillins Rash    Medications Reviewed Today     Reviewed by Germaine Pomfret, [facility] (Pharmacist) on 08/13/22 at 1043  Med List Status: <None>   Medication Order Taking? Sig Documenting Provider Last Dose Status Informant  Accu-Chek Softclix Lancets lancets [id number]  Use to test blood sugars 4 times daily. Cathlyn Parsons, PA-C  Active   acetaminophen (TYLENOL) 325 MG tablet [id number]  Take 2 tablets (650 mg total) by mouth every 4 (four) hours as needed for mild pain (or temp > 37.5 C (99.5 F)). Cathlyn Parsons, PA-C  Active   amLODipine (NORVASC) 10 MG tablet [id number] Yes Take by mouth. [provider] Taking Active   atorvastatin (LIPITOR) 80 MG tablet [id number] Yes Take 1 tablet (80 mg total) by mouth daily at 6 PM. [name], PA-C Taking Active   Baclofen 5 MG TABS [id number] Yes Take 5 mg by mouth at bedtime. [provider] Taking Active   Blood Pressure Monitoring DEVI [id number]  Check bp in AM and PM Drubel, Delman C[place]  Active   cyanocobalamin 1000 MCG tablet [id number] Yes Take 1,000 mcg by mouth daily. Gummy [provider] Taking Active   ELIQUIS 5 MG TABS tablet [id number] Yes Take 1 tablet by mouth twice daily Drubel, Ria Comment, PA-C Taking Active   escitalopram (LEXAPRO) 10 MG tablet [id number] Yes Take 1 tablet by mouth daily. [provider] Taking Active   glucose blood (ACCU-CHEK GUIDE) test strip [id number]  Use to test blood sugars 4 times daily. Cathlyn Parsons, PA-C  Active   Insulin  Glargine [facility] KWIKPEN) 100 UNIT/ML [id number] Yes Inject 34 Units into the skin daily. [name], PA-C Taking Active   Insulin Pen Needle 32G X 4 MM MISC [id number]  Use to inject insulin up to 4 times daily. [name], PA-C  Active   loperamide (IMODIUM A-D) 2 MG tablet [id number]  Take by mouth. [provider]  Active   melatonin 3 MG TABS tablet [id number] Yes Take 1 tablet (3 mg total) by mouth at bedtime as needed. Cathlyn Parsons, PA-C Taking Active   metoprolol tartrate (LOPRESSOR) 50 MG tablet [id number] Yes Take 50 mg by mouth 2 (two) times daily. [provider] Taking Active   Discontinued 08/13/22 1043 (Completed Course)             Patient Active Problem List   Diagnosis Date Noted   Sinus tachycardia    Labile blood pressure    Uncontrolled type 2 diabetes mellitus with hyperglycemia (HCC)    CVA (cerebral vascular accident) ([place]) 01/13/2022   AKI (acute kidney injury) ([place]) 01/13/2022   Hyponatremia 01/13/2022   Elevated troponin 01/13/2022   Cerebrovascular accident (CVA) ([place]) 12/15/2020   Noncompliance with diabetes treatment 12/15/2020   Hyperlipidemia associated with type 2 diabetes mellitus ([place]) 08/11/2020   Type 2 diabetes mellitus with other specified complication ([place]) 59/74/1638   Hypertension 10/10/2018   Morbid obesity ([place]) 10/10/2018    Immunization History  Administered Date(s) Administered   PFIZER(Purple Top)SARS-COV-2 Vaccination 02/18/2020    Conditions to be addressed/monitored:  Hypertension, Hyperlipidemia, Diabetes, and History of CVA, History of DVT   Care Plan : General Pharmacy (Adult)  Updates made by Germaine Pomfret, RPH since 08/13/2022 12:00 AM     Problem: Hypertension, Hyperlipidemia, Diabetes, and History of CVA, History of DVT   Priority: High     Long-Range Goal: Patient-Specific Goal   Start Date: 04/17/2022  Expected End Date: 04/18/2023  This Visit's Progress: On track  Recent  Progress: On track  Priority: High  Note:   Current Barriers:   Unable to self-manage medications  as prescribed.   Pharmacist Clinical Goal(s):  Patient will achieve control of stroke as evidenced by control of HTN, HLD and T2DM through collaboration with PharmD and provider.   Interventions: 1:1 collaboration with [name], PA-C regarding development and update of comprehensive plan of care as evidenced by provider attestation and co-signature Inter-disciplinary care team collaboration (see longitudinal plan of care) Comprehensive medication review performed; medication list updated in electronic medical record  Hypertension (BP goal <130/80) -Controlled -Current treatment: Amlodipine 10 mg daily  Metoprolol tartrate 50 mg twice daily  -Medications previously tried: NA  -Patient only taking metoprolol once daily, advised on importance of twice daily. Patient crushes all tablets so would not be able to switch to metoprolol succinate.  -Current home readings: Not monitoring regularly -Denies hypotensive/hypertensive symptoms -Recommended to continue current medication  Hyperlipidemia: (LDL goal < 70) -Uncontrolled -Current treatment: Atorvastatin 80 mg daily  -Current treatment: Eliquis 5 mg twice daily  -Medications previously tried: NA  -Weakness in legs, arms. Occupational therapy x1 weekly. PT 2x weekly.  -Recommended to continue current medication  Diabetes (A1c goal <8%) -Uncontrolled -Diagnosed: 6 months prior  -Current medications: Basaglar 16 units daily  -Medications previously tried: Glimepiride, Farxiga, Januvia  -Current home glucose readings Before supper: 157, typically 140-150s  -Denies hypoglycemic/hyperglycemic symptoms -Recommended to continue current medication  Depression/Anxiety (Goal: Achieve symptom remission) -Controlled -Current treatment: Escitalopram 10 mg daily  Melatonin 3 mg nightly  -Medications previously tried/failed:  NA -PHQ9: 2 -Husband concerned with patient sleeping too much, patient endorses good quality sleep but agrees she sleeps a lot.  -STOP Melatonin.  DVT (Goal: Prevent clots) -Controlled -Current treatment  Eliquis 5 mg twice daily  -Medications previously tried: NA  -Patient started on Eliquis due to DVT, notes state to continue until 04/30/2021. Does not appear to be dressed since then.  -Recommend stopping Eliquis at this point due to lack of evidence with continued use, starting aspirin 81 mg daily. Will discuss with PCP after labwork/ physical.   Patient Goals/Self-Care Activities Patient will:  - check glucose daily before breakfast, document, and provide at future appointments check blood pressure 2-3 times weekly, document, and provide at future appointments  Follow Up Plan: Telephone follow up appointment with care management team member scheduled for:  07/31/2022 at 3:30 PM      Medication Assistance: None required.  Patient affirms current coverage meets needs.  Compliance/Adherence/Medication fill history: Care Gaps: Urine Microalbumin Tetanus/TDAP Colonoscopy Mammogram Zoster Vaccines COVID-19 Vaccine Booster 2 PAP Smear Diabetic Eye Exam PNA Vaccine Dexa Scan A1C > 9  Star-Rating Drugs: Atorvastatin 80 mg last filled on 03/01/2022 for a 30-Day supply with [facility]  Patient's preferred pharmacy is:  [facility] - [place], [place] - [address] [place] [address] Phone: [phone] Fax: [phone]  Uses pill box? Yes Pt endorses 100% compliance  We discussed: Verbal consent obtained for [facility] enhanced pharmacy services (medication synchronization, adherence packaging, delivery coordination). A medication sync plan was created to allow patient to get all medications delivered once every 30 to 90 days per patient preference. Patient understands they have freedom to choose  pharmacy and clinical pharmacist will coordinate care between all prescribers and [facility].  Patient decided to: Utilize [facility] for medication synchronization, packaging and delivery  Care Plan and Follow Up Patient Decision:  Patient agrees to Care Plan and Follow-up.  Plan: Telephone follow up appointment with care management team member scheduled for:  07/31/2022 at 3:30 PM  Junius Argyle, PharmD, Para [place], [place] Pharmacist Practitioner  [facility] [phone]

## 2022-08-16 ENCOUNTER — Telehealth: Payer: Self-pay

## 2022-08-16 MED ORDER — ASPIRIN 81 MG PO CHEW: 81.0000 mg | CHEWABLE_TABLET | Freq: Every day | ORAL | Status: DC

## 2022-08-16 MED ORDER — ASPIRIN 81 MG PO CHEW: 81.0000 mg | CHEWABLE_TABLET | Freq: Every day | ORAL | 3 refills | Status: AC

## 2022-08-16 NOTE — Addendum Note (Signed)
Addended by: Daron Offer A on: 08/16/2022 09:35 AM   Modules accepted: Orders

## 2022-08-16 NOTE — Addendum Note (Signed)
Addended by: Daron Offer A on: 08/16/2022 03:39 PM   Modules accepted: Orders

## 2022-08-16 NOTE — Progress Notes (Signed)
Chronic Care Management Pharmacy Assistant   Name: Samantha [name]  MRN: [id number] DOB: 09/19/1956  Reason for Encounter: Lab Results   I received a task from Junius Argyle, CPP requesting that I contact the patient regarding her recent lab results.  Per Cristie Hem he would like the patient to be informed of the following:  Her labwork came back and showed her blood sugars were under excellent control! Her A1c improved to 6.1%, which is below our goal of 7%.  For now I would like her to continue using her insulin daily but at her next phone visit with me I would like to discuss other options that might reduce her risk of stroke.    Her cholesterol improved slightly from March, but her bad cholesterol is still elevated. Goal is LDL less than 70. Please focus on taking atorvastatin daily and minimizing fried foods and saturated fats.   Her b12 level was at goal and is likely not the cause for her fatigue. She can continue using the B12 supplement if she would like.    At this point she no longer needs to take Eliquis to prevent clots. I would like her to stop Eliquis and instead start taking Aspirin 81 mg daily to prevent strokes. If she would like I can send in a prescription to [facility] so they can deliver the new medication for her.    Finally, please ask her to schedule a physical with Ria Comment in the next 2-3 weeks.  I contacted the patient's spouse whom is on her HIPAA, and advised him of all the above word for word. Patient spouse verbalized understanding to each instruction. Per the spouse he would like for CPP to send a script for patient's Aspirin 81 mg to [facility] to have them deliver it to her home.  Patient's spouse instructed to give me a call back with any questions or concerns he may have regarding the results and instructions.  CPP has been notified and will send the prescription.  Medications: Outpatient Encounter Medications as of 08/16/2022  Medication Sig    Accu-Chek Softclix Lancets lancets Use to test blood sugars 4 times daily.   acetaminophen (TYLENOL) 325 MG tablet Take 2 tablets (650 mg total) by mouth every 4 (four) hours as needed for mild pain (or temp > 37.5 C (99.5 F)).   amLODipine (NORVASC) 10 MG tablet Take 1 tablet (10 mg total) by mouth daily.   aspirin (ASPIRIN 81) 81 MG chewable tablet Chew 1 tablet (81 mg total) by mouth daily.   atorvastatin (LIPITOR) 80 MG tablet Take 1 tablet (80 mg total) by mouth daily at 6 PM.   Baclofen 5 MG TABS Take 5 mg by mouth at bedtime.   Blood Pressure Monitoring DEVI Check bp in AM and PM   cyanocobalamin 1000 MCG tablet Take 1,000 mcg by mouth daily. Gummy   escitalopram (LEXAPRO) 10 MG tablet Take 1 tablet (10 mg total) by mouth daily.   glucose blood (ACCU-CHEK GUIDE) test strip Use to test blood sugars 4 times daily.   Insulin Glargine (BASAGLAR KWIKPEN) 100 UNIT/ML Inject 16 Units into the skin daily.   Insulin Pen Needle 32G X 4 MM MISC Use to inject insulin up to 4 times daily.   loperamide (IMODIUM A-D) 2 MG tablet Take by mouth.   metoprolol tartrate (LOPRESSOR) 50 MG tablet Take 1 tablet (50 mg total) by mouth 2 (two) times daily.   No facility-administered encounter medications on file as of  08/16/2022.    Adelene Idler, CPA/CMA Clinical Pharmacist Assistant Phone: [phone]

## 2022-08-20 LAB — COMPREHENSIVE METABOLIC PANEL
ALT: 30 IU/L (ref 0–32)
AST: 35 IU/L (ref 0–40)
Albumin/Globulin Ratio: 0.8 — ABNORMAL LOW (ref 1.2–2.2)
Albumin: 3 g/dL — ABNORMAL LOW (ref 3.9–4.9)
Alkaline Phosphatase: 135 IU/L — ABNORMAL HIGH (ref 44–121)
BUN/Creatinine Ratio: 11 — ABNORMAL LOW (ref 12–28)
BUN: 8 mg/dL (ref 8–27)
Bilirubin Total: 0.4 mg/dL (ref 0.0–1.2)
CO2: 22 mmol/L (ref 20–29)
Calcium: 9 mg/dL (ref 8.7–10.3)
Chloride: 104 mmol/L (ref 96–106)
Creatinine, Ser: 0.74 mg/dL (ref 0.57–1.00)
Globulin, Total: 3.9 g/dL (ref 1.5–4.5)
Glucose: 187 mg/dL — ABNORMAL HIGH (ref 70–99)
Potassium: 3.8 mmol/L (ref 3.5–5.2)
Sodium: 145 mmol/L — ABNORMAL HIGH (ref 134–144)
Total Protein: 6.9 g/dL (ref 6.0–8.5)
eGFR: 90 mL/min/{1.73_m2} (ref >59)

## 2022-08-20 LAB — CBC WITH DIFFERENTIAL/PLATELET
Basophils Absolute: 0 10*3/uL (ref 0.0–0.2)
Basos: 1 %
EOS (ABSOLUTE): 0.2 10*3/uL (ref 0.0–0.4)
Eos: 2 %
Hematocrit: 38.8 % (ref 34.0–46.6)
Hemoglobin: 13 g/dL (ref 11.1–15.9)
Immature Grans (Abs): 0 10*3/uL (ref 0.0–0.1)
Immature Granulocytes: 0 %
Lymphocytes Absolute: 1.8 10*3/uL (ref 0.7–3.1)
Lymphs: 22 %
MCH: 33.9 pg — ABNORMAL HIGH (ref 26.6–33.0)
MCHC: 33.5 g/dL (ref 31.5–35.7)
MCV: 101 fL — ABNORMAL HIGH (ref 79–97)
Monocytes Absolute: 0.5 10*3/uL (ref 0.1–0.9)
Monocytes: 6 %
Neutrophils Absolute: 5.6 10*3/uL (ref 1.4–7.0)
Neutrophils: 69 %
Platelets: 211 10*3/uL (ref 150–450)
RBC: 3.84 x10E6/uL (ref 3.77–5.28)
RDW: 13 % (ref 11.7–15.4)
WBC: 8.2 10*3/uL (ref 3.4–10.8)

## 2022-08-20 LAB — LIPID PANEL
Chol/HDL Ratio: 3 ratio (ref 0.0–4.4)
Cholesterol, Total: 194 mg/dL (ref 100–199)
HDL: 64 mg/dL (ref >39)
LDL Chol Calc (NIH): 110 mg/dL — ABNORMAL HIGH (ref 0–99)
Triglycerides: 113 mg/dL (ref 0–149)
VLDL Cholesterol Cal: 20 mg/dL (ref 5–40)

## 2022-08-20 LAB — HEMOGLOBIN A1C
Est. average glucose Bld gHb Est-mCnc: 128 mg/dL
Hgb A1c MFr Bld: 6.1 % — ABNORMAL HIGH (ref 4.8–5.6)

## 2022-08-20 LAB — MICROALBUMIN / CREATININE URINE RATIO

## 2022-08-20 LAB — VITAMIN B12: Vitamin B-12: 1060 pg/mL (ref 232–1245)

## 2022-08-21 ENCOUNTER — Telehealth: Payer: Self-pay

## 2022-08-21 ENCOUNTER — Ambulatory Visit: Payer: Self-pay | Admitting: *Deleted

## 2022-08-21 NOTE — Telephone Encounter (Signed)
Summary: upset stomach   Pt has been complaining about upset stomach for the last two days with vomiting / no nausea /this call came in from [place] the Production designer, theatre/television/film / the pts husband called her / Philis Nettle asked for a nurse to give the pt a call / please advise        Chief Complaint: pharmacy tech called to report patient with abdominal pain and vomiting.called patient's husband on DPR. Patient with hx stroke and resting at this time. Symptoms: abdominal pain comes and goes. Vomiting reported 1 time every other day. Husband reports not severe. Is having regular BMs.  Husband Does report patient tries to go to bathroom a lot but doesn't go every time.  Eating and drinking well.  Frequency: 2 days Pertinent Negatives: Patient denies fever, no diarrhea Disposition: [] ED /[] Urgent Care (no appt availability in office) / [] Appointment(In office/virtual)/ []  [facility]/ [x] Home Care/ [] Refused Recommended Disposition /[] [facility]/ []  Follow-up with PCP Additional Notes:   Recommended to eat small meals. Sit up 30 minutes after eating. Call back if vomiting continues or abdominal pain worsens. Husband requesting to know from [facility], since changing medication refills to [facility] deliveries will there be a charge for deliveries? So far patient has been charged for delivery of medications and was told there should not be a charge . Please assist and clarify.        Reason for Disposition  [1] MILD-MODERATE pain AND [2] comes and goes (cramps)  Answer Assessment - Initial Assessment Questions 1. LOCATION: "Where does it hurt?"      Husband DPR, reports patient does not specifically say where pain is located. Hx stroke  2. RADIATION: "Does the pain shoot anywhere else?" (e.g., chest, back)     na 3. ONSET: "When did the pain begin?" (e.g., minutes, hours or days ago)      2 days ago  4. SUDDEN: "Gradual or sudden onset?"     na 5. PATTERN "Does the pain come and go,  or is it constant?"    - If it comes and goes: "How long does it last?" "Do you have pain now?"     (Note: Comes and goes means the pain is intermittent. It goes away completely between bouts.)    - If constant: "Is it getting better, staying the same, or getting worse?"      (Note: Constant means the pain never goes away completely; most serious pain is constant and gets worse.)      Comes and goes  6. SEVERITY: "How bad is the pain?"  (e.g., Scale 1-10; mild, moderate, or severe)    - MILD (1-3): Doesn't interfere with normal activities, abdomen soft and not tender to touch.     - MODERATE (4-7): Interferes with normal activities or awakens from sleep, abdomen tender to touch.     - SEVERE (8-10): Excruciating pain, doubled over, unable to do any normal activities.       na 7. RECURRENT SYMPTOM: "Have you ever had this type of stomach pain before?" If Yes, ask: "When was the last time?" and "What happened that time?"      na 8. CAUSE: "What do you think is causing the stomach pain?"     Not sure  9. RELIEVING/AGGRAVATING FACTORS: "What makes it better or worse?" (e.g., antacids, bending or twisting motion, bowel movement)     na 10. OTHER SYMPTOMS: "Do you have any other symptoms?" (e.g., back pain, diarrhea, fever, urination  pain, vomiting)       Vomiting approx. 1 time every day , no reason noted for vomiting  11. PREGNANCY: "Is there any chance you are pregnant?" "When was your last menstrual period?"       na  Protocols used: Abdominal Pain - [facility]

## 2022-08-21 NOTE — Telephone Encounter (Signed)
I called and spoke with patients husband, advising him of Lindsay's message below. Patients husband agreed to schedule an appointment soon, but stated "I will call back later to schedule an appointment".

## 2022-08-21 NOTE — Progress Notes (Signed)
Chronic Care Management Pharmacy Assistant   Name: Teneil Shiller  MRN: [id number] DOB: Sep 03, 1956   Reason for Encounter: Complaints of Upset Stomach/Question Regarding [facility]   I spoke with the patient's spouse and he reports that he received the patient's medication delivery from [facility] on yesterday. He advises that the cost was 11.00, but he was under the understanding that the patient would not pay anything.    The patient recently was instructed by CPP to start ASA 81 mg daily and since that is an OTC medication there would be some expense for that but the other medications he feels like there should have been no cost.   I sent a message to [facility] and advised the patient's spouse once I have more information I would contact him back.  The patient's spouse also stated that the patient is c/o having an upset stomach about a hour after she eats X 2 days. Per spouse the last 2 days either the patient has vomited a little after eating or she has had a feeling of needing to have a bowel movement with no results. Per spouse there are no complaints of nausea. The patient isn't eating any fried foods mostly fruits and vegetables.   I spoke with CPP and he reports that it's most likely not patient's medications that is causing this issue. He advises to have patient be seen by PCP.  PCP 's office contacted, and I spoke with Chaz. Per Melynda Keller she is going to have a nurse contact her and Barbaraann Boys the patient. If patient needs to be seen they will schedule an appointment for her.  [id number]  Lauren Hysmith with [facility] replied back to my message and this is what she advised:    Yes I spoke to the patient's daughter and confirmed before delivery, but there was no number provided for her spouse. The Aspirin was $1, Baclofen came up $5 and Escitalopram $4.85  I contacted the patient's spouse and advised of the above information, and he is agreeable to continuing to have  [facility] delivery the patient's medication and okay with the price. Patient's spouse would like to know in advance the cost prior to delivery so he can make sure to have the money on hand when the delivery is made. I informed him that I will try to provide this information on my monthly call to him regarding patient's medication coordination.   Per spouse he was contacted by the nurse and is agreeable to watch the patient a little closer to gain more insight on her symptoms. Per spouse he was just concerned that the patient states she has to use the restroom and sometimes after a long sit she isn't able to. Per spouse he does give her Miralax sometimes to assist with bowel movements. He is agreeable that he should probably start giving her the Miralax daily for now vs prn. He is also concerned that she vomits just a little after eating. I educated that he should allow her to sit up about 30 minutes to an hour after a meal to see if that helps resolve the regurgitation issue. Per patient he is going to try this and make sure she chews her food well.   At this time patient's spouse has no other concerns or issues. He is encouraged to give me a call if he has any additional questions or needs anything.    Medications: Outpatient Encounter Medications as of 08/21/2022  Medication Sig   Accu-Chek Softclix  Lancets lancets Use to test blood sugars 4 times daily.   acetaminophen (TYLENOL) 325 MG tablet Take 2 tablets (650 mg total) by mouth every 4 (four) hours as needed for mild pain (or temp > 37.5 C (99.5 F)).   amLODipine (NORVASC) 10 MG tablet Take 1 tablet (10 mg total) by mouth daily.   aspirin (ASPIRIN 81) 81 MG chewable tablet Chew 1 tablet (81 mg total) by mouth daily.   atorvastatin (LIPITOR) 80 MG tablet Take 1 tablet (80 mg total) by mouth daily at 6 PM.   Baclofen 5 MG TABS Take 5 mg by mouth at bedtime.   Blood Pressure Monitoring DEVI Check bp in AM and PM   cyanocobalamin 1000 MCG tablet  Take 1,000 mcg by mouth daily. Gummy   escitalopram (LEXAPRO) 10 MG tablet Take 1 tablet (10 mg total) by mouth daily.   glucose blood (ACCU-CHEK GUIDE) test strip Use to test blood sugars 4 times daily.   Insulin Glargine (BASAGLAR KWIKPEN) 100 UNIT/ML Inject 16 Units into the skin daily.   Insulin Pen Needle 32G X 4 MM MISC Use to inject insulin up to 4 times daily.   loperamide (IMODIUM A-D) 2 MG tablet Take by mouth.   metoprolol tartrate (LOPRESSOR) 50 MG tablet Take 1 tablet (50 mg total) by mouth 2 (two) times daily.   No facility-administered encounter medications on file as of 08/21/2022.    Lynann Bologna, CPA/CMA Clinical Pharmacist Assistant Phone: [phone]

## 2022-09-05 ENCOUNTER — Telehealth: Payer: Self-pay

## 2022-09-05 NOTE — Progress Notes (Signed)
    Chronic Care Management Pharmacy Assistant   Name: Samantha [name]  MRN: [id number] DOB: November 01, 1955  Reason for Encounter: Medication Review/Medication Coordination for [facility]   Recent office visits:  None ID  Recent consult visits:  None ID  Hospital visits:  None in previous 6 months  Medications: Outpatient Encounter Medications as of 09/05/2022  Medication Sig   Accu-Chek Softclix Lancets lancets Use to test blood sugars 4 times daily.   acetaminophen (TYLENOL) 325 MG tablet Take 2 tablets (650 mg total) by mouth every 4 (four) hours as needed for mild pain (or temp > 37.5 C (99.5 F)).   amLODipine (NORVASC) 10 MG tablet Take 1 tablet (10 mg total) by mouth daily.   aspirin (ASPIRIN 81) 81 MG chewable tablet Chew 1 tablet (81 mg total) by mouth daily.   atorvastatin (LIPITOR) 80 MG tablet Take 1 tablet (80 mg total) by mouth daily at 6 PM.   Baclofen 5 MG TABS Take 5 mg by mouth at bedtime.   Blood Pressure Monitoring DEVI Check bp in AM and PM   cyanocobalamin 1000 MCG tablet Take 1,000 mcg by mouth daily. Gummy   escitalopram (LEXAPRO) 10 MG tablet Take 1 tablet (10 mg total) by mouth daily.   glucose blood (ACCU-CHEK GUIDE) test strip Use to test blood sugars 4 times daily.   Insulin Glargine (BASAGLAR KWIKPEN) 100 UNIT/ML Inject 16 Units into the skin daily.   Insulin Pen Needle 32G X 4 MM MISC Use to inject insulin up to 4 times daily.   loperamide (IMODIUM A-D) 2 MG tablet Take by mouth.   metoprolol tartrate (LOPRESSOR) 50 MG tablet Take 1 tablet (50 mg total) by mouth 2 (two) times daily.   No facility-administered encounter medications on file as of 09/05/2022.   Care Gaps: Tetanus/TDAP Colonoscopy Mammogram Zoster Vaccines PAP-Smear Diabetic Eye Exam PNA Vaccine Dexa Scan Influenza Vaccine Diabetic Foot Exam  Star Rating Drugs: Atorvastatin 80 mg last filled on 08/30/2022 for an 18-Day supply with [facility]  BP Readings from Last 3  Encounters:  04/13/22 121/87  02/12/22 (!) 140/52  01/17/22 (!) 150/71    Lab Results  Component Value Date   HGBA1C 6.1 (H) 08/13/2022    Patient obtains medications through Adherence Packaging  30 Days   Last adherence delivery included:  1st time completing adherence delivery for this patient  Patient declined medications for the month of October: 1st adherence delivery so unsure of any medications being declined last month  Patient is due for next adherence delivery on: 09/17/2022 [facility] Route. Called patient and reviewed medications and coordinated delivery.  This delivery to include: Amlodipine 10 mg 1 tablet daily (Evening Meal) Atorvastatin 80 mg 1 tablet daily (Evening Meal) Metoprolol 50 mg twice daily (Breakfast, Evening Meal) Aspirin Chew 81 mg 1 tablet daily (Evening Meal) Baclofen 5 mg 1 tablet daily (Evening Meal) Escitalopram 10 mg 1 tablet daily (Evening Meal) Basaglar 100-U Inject 16 U daily Needles 32G X 5/32" Use 4 times daily   Patient declined the following medications for the month of November:  Patient needs refills for for the month of November: No refills needed for this delivery  Confirmed delivery date of 09/17/2022 [facility], advised patient that pharmacy will contact them the morning of delivery.   Adelene Idler, CPA/CMA Clinical Pharmacist Assistant Phone: [phone]

## 2022-09-15 DIAGNOSIS — I639 Cerebral infarction, unspecified: Secondary | ICD-10-CM | POA: Diagnosis not present

## 2022-09-15 DIAGNOSIS — E119 Type 2 diabetes mellitus without complications: Secondary | ICD-10-CM | POA: Diagnosis not present

## 2022-09-24 ENCOUNTER — Ambulatory Visit: Payer: 59

## 2022-09-24 DIAGNOSIS — E1169 Type 2 diabetes mellitus with other specified complication: Secondary | ICD-10-CM

## 2022-09-24 DIAGNOSIS — I10 Essential (primary) hypertension: Secondary | ICD-10-CM

## 2022-09-24 NOTE — Progress Notes (Signed)
Chronic Care Management Pharmacy Note  09/27/2022 Name:  Samantha [name] MRN:  [id number] DOB:  [birth year]  Summary: Patient presents for CCM follow-up. Today's visit was conducted 100% with her husband, Samantha [name] who helps her manage her medications.  Medication changes deferred in order to get patient re-established with PCP. Stressed the importance of routine follow-up in order to maintain health and routine delivery of medications.  Recommendations/Changes made from today's visit: Continue current medications  Plan: CPP follow-up after PCP follow-up.   Subjective: Samantha [name] is an 66 y.o. year old female who is a primary patient of Thedore Mins, Ria Comment, [place].  The CCM team was consulted for assistance with disease management and care coordination needs.    Engaged with patient by telephone for follow up visit in response to provider referral for pharmacy case management and/or care coordination services.   Consent to Services:  The patient was given information about Chronic Care Management services, agreed to services, and gave verbal consent prior to initiation of services.  Please see initial visit note for detailed documentation.   Patient Care Team: [name], PA-C as PCP - General (Physician Assistant) Germaine Pomfret, [facility] (Pharmacist)  Recent office visits: 03/28/2022 [name], PA-C (PCP Office Visit for Upper Respiratory Tract Infection-Started: Benzonatate 100 mg 3 times daily, No orders placed no follow-up noted   03/01/2022 [name], PA-C (PCP Video Visit) for HTN- No Medication Changes noted, Neurology Referral placed, Cardiology Referral placed, DME order placed for a 4 wheeled rolling walker, Return in 4 weeks  Recent consult visits: 03/28/2022 Hemang Leeanne Deed, MD (Neurology) for CVA- No medication changes noted, No orders placed, No follow-up noted  Hospital visits: Admitted to the hospital on 01/17/2022 due to CVA. Discharge date was  02/13/2022. Discharged from [facility].    Admitted to the hospital on 01/13/2022 due to CVA. Discharge date was 01/17/2022. Discharged from [facility].    Objective:  Lab Results  Component Value Date   CREATININE 0.74 08/13/2022   BUN 8 08/13/2022   EGFR 90 08/13/2022   GFRNONAA 48 (L) 02/09/2022   GFRAA 84 11/18/2019   NA 145 (H) 08/13/2022   K 3.8 08/13/2022   CALCIUM 9.0 08/13/2022   CO2 22 08/13/2022   GLUCOSE 187 (H) 08/13/2022    Lab Results  Component Value Date/Time   HGBA1C 6.1 (H) 08/13/2022 11:16 AM   HGBA1C 13.1 (H) 01/14/2022 02:16 AM    Last diabetic Eye exam:  Lab Results  Component Value Date/Time   HMDIABEYEEXA Retinopathy (A) 12/26/2020 12:00 AM    Last diabetic Foot exam: No results found for: "HMDIABFOOTEX"   Lab Results  Component Value Date   CHOL 194 08/13/2022   HDL 64 08/13/2022   LDLCALC 110 (H) 08/13/2022   TRIG 113 08/13/2022   CHOLHDL 3.0 08/13/2022       Latest Ref Rng & Units 08/13/2022   11:16 AM 01/18/2022    5:42 AM 01/14/2022    2:16 AM  Hepatic Function  Total Protein 6.0 - 8.5 g/dL 6.9  5.9  6.3   Albumin 3.9 - 4.9 g/dL 3.0  2.7  2.8   AST 0 - 40 IU/L 35  24  16   ALT 0 - 32 IU/L _0 Alk Phosphatase 44 - 121 IU/L 135  68  75   Total Bilirubin 0.0 - 1.2 mg/dL 0.4  1.0  0.5     Lab Results  Component  Value Date/Time   TSH 1.590 11/18/2019 04:07 PM       Latest Ref Rng & Units 08/13/2022   11:16 AM 01/30/2022    5:38 AM 01/23/2022    6:04 AM  CBC  WBC 3.4 - 10.8 x10E3/uL 8.2  8.8  7.8   Hemoglobin 11.1 - 15.9 g/dL 13.0  10.3  11.0   Hematocrit 34.0 - 46.6 % 38.8  32.9  34.1   Platelets 150 - 450 x10E3/uL 211  195  196     Lab Results  Component Value Date/Time   VD25OH 24.03 (L) 01/18/2022 05:42 AM    Clinical ASCVD: Yes  The ASCVD Risk score (Arnett DK, et al., 2019) failed to calculate for the following reasons:   The patient has a prior MI or stroke diagnosis        12/13/2020    4:29 PM 10/10/2018    2:07 PM  Depression screen PHQ 2/9  Decreased Interest 0 0  Down, Depressed, Hopeless 0 0  PHQ - 2 Score 0 0  Altered sleeping 1   Tired, decreased energy 1   Change in appetite 0   Feeling bad or failure about yourself  0   Trouble concentrating 0   Moving slowly or fidgety/restless 0   Suicidal thoughts 0   PHQ-9 Score 2   Difficult doing work/chores Not difficult at all      Social History   Tobacco Use  Smoking Status Never  Smokeless Tobacco Never   BP Readings from Last 3 Encounters:  04/13/22 121/87  02/12/22 (!) 140/52  01/17/22 (!) 150/71   Pulse Readings from Last 3 Encounters:  04/13/22 67  02/12/22 67  01/17/22 87   Wt Readings from Last 3 Encounters:  04/13/22 276 lb (125.2 kg)  02/07/22 276 lb 7.3 oz (125.4 kg)  01/16/22 287 lb 14.7 oz (130.6 kg)   BMI Readings from Last 3 Encounters:  04/13/22 53.90 kg/m  02/07/22 53.99 kg/m  01/16/22 56.23 kg/m    Assessment/Interventions: Review of patient past medical history, allergies, medications, health status, including review of consultants reports, laboratory and other test data, was performed as part of comprehensive evaluation and provision of chronic care management services.   SDOH:  (Social Determinants of Health) assessments and interventions performed: Yes SDOH Interventions    Flowsheet Row Chronic Care Management from 04/06/2022 in [place] Visit from 12/13/2020 in [place] Interventions    Transportation Interventions Intervention Not Indicated --  Depression Interventions/Treatment  -- PHQ2-9 Score <4 Follow-up Not Indicated  Financial Strain Interventions Intervention Not Indicated --       SDOH Screenings   Transportation Needs: No Transportation Needs (04/17/2022)  Alcohol Screen: Low Risk  (12/13/2020)  Depression (PHQ2-9): Low Risk  (12/13/2020)  Financial Resource Strain: Low Risk   (04/17/2022)  Tobacco Use: Low Risk  (04/13/2022)    [place]  Allergies  Allergen Reactions   Penicillins Rash    Medications Reviewed Today     Reviewed by Germaine Pomfret, [facility] (Pharmacist) on 08/13/22 at 15  Med List Status: <None>   Medication Order Taking? Sig Documenting Provider Last Dose Status Informant  Accu-Chek Softclix Lancets lancets [id number]  Use to test blood sugars 4 times daily. Cathlyn Parsons, PA-C  Active   acetaminophen (TYLENOL) 325 MG tablet [id number]  Take 2 tablets (650 mg total) by mouth every 4 (four) hours as needed for mild pain (or temp > 37.5 C (99.5  F)). Cathlyn Parsons, PA-C  Active   amLODipine (NORVASC) 10 MG tablet [id number] Yes Take by mouth. [provider] Taking Active   atorvastatin (LIPITOR) 80 MG tablet [id number] Yes Take 1 tablet (80 mg total) by mouth daily at 6 PM. [name], PA-C Taking Active   Baclofen 5 MG TABS [id number] Yes Take 5 mg by mouth at bedtime. [provider] Taking Active   Blood Pressure Monitoring DEVI [id number]  Check bp in AM and PM Drubel, Delman [place]  Active   cyanocobalamin 1000 MCG tablet [id number] Yes Take 1,000 mcg by mouth daily. Gummy [provider] Taking Active   ELIQUIS 5 MG TABS tablet [id number] Yes Take 1 tablet by mouth twice daily Drubel, Ria Comment, PA-C Taking Active   escitalopram (LEXAPRO) 10 MG tablet [id number] Yes Take 1 tablet by mouth daily. [provider] Taking Active   glucose blood (ACCU-CHEK GUIDE) test strip [id number]  Use to test blood sugars 4 times daily. Cathlyn Parsons, PA-C  Active   Insulin Glargine [facility] KWIKPEN) 100 UNIT/ML [id number] Yes Inject 34 Units into the skin daily. [name], PA-C Taking Active   Insulin Pen Needle 32G X 4 MM MISC [id number]  Use to inject insulin up to 4 times daily. [name], PA-C  Active   loperamide (IMODIUM A-D) 2 MG tablet [id number]  Take by mouth. [provider]   Active   melatonin 3 MG TABS tablet [id number] Yes Take 1 tablet (3 mg total) by mouth at bedtime as needed. Cathlyn Parsons, PA-C Taking Active   metoprolol tartrate (LOPRESSOR) 50 MG tablet [id number] Yes Take 50 mg by mouth 2 (two) times daily. [provider] Taking Active   Discontinued 08/13/22 1043 (Completed Course)             Patient Active Problem List   Diagnosis Date Noted   Sinus tachycardia    Labile blood pressure    Uncontrolled type 2 diabetes mellitus with hyperglycemia (HCC)    CVA (cerebral vascular accident) ([place]) 01/13/2022   AKI (acute kidney injury) ([place]) 01/13/2022   Hyponatremia 01/13/2022   Elevated troponin 01/13/2022   Cerebrovascular accident (CVA) ([place]) 12/15/2020   Noncompliance with diabetes treatment 12/15/2020   Hyperlipidemia associated with type 2 diabetes mellitus ([place]) 08/11/2020   Type 2 diabetes mellitus with other specified complication ([place]) 33/54/5625   Hypertension 10/10/2018   Morbid obesity ([place]) 10/10/2018    Immunization History  Administered Date(s) Administered   PFIZER(Purple Top)SARS-COV-2 Vaccination 02/18/2020    Conditions to be addressed/monitored:  Hypertension, Hyperlipidemia, Diabetes, and History of CVA, History of DVT   Care Plan : General Pharmacy (Adult)  Updates made by Germaine Pomfret, RPH since 09/27/2022 12:00 AM     Problem: Hypertension, Hyperlipidemia, Diabetes, and History of CVA, History of DVT   Priority: High     Long-Range Goal: Patient-Specific Goal   Start Date: 04/17/2022  Expected End Date: 04/18/2023  This Visit's Progress: On track  Recent Progress: On track  Priority: High  Note:   Current Barriers:   Unable to self-manage medications as prescribed.   Pharmacist Clinical Goal(s):  Patient will achieve control of stroke as evidenced by control of HTN, HLD and T2DM through collaboration with PharmD and provider.   Interventions: 1:1 collaboration with [name], PA-C regarding development and update of comprehensive plan of care as evidenced by provider attestation and co-signature Inter-disciplinary care team collaboration (see longitudinal plan of care) Comprehensive medication review performed; medication list  updated in electronic medical record  Hypertension (BP goal <130/80) -Controlled -Current treatment: Amlodipine 10 mg daily  Metoprolol tartrate 50 mg twice daily  -Medications previously tried: NA  -Patient only taking metoprolol once daily, advised on importance of twice daily. Patient crushes all tablets so would not be able to switch to metoprolol succinate.  -Current home readings: Not monitoring regularly -Denies hypotensive/hypertensive symptoms -Recommended to continue current medication  Hyperlipidemia: (LDL goal < 70) -Uncontrolled -Current treatment: Atorvastatin 80 mg daily  -Current treatment: Eliquis 5 mg twice daily  -Medications previously tried: NA  -Weakness in legs, arms. Occupational therapy x1 weekly. PT 2x weekly.  -Recommended to continue current medication  Diabetes (A1c goal <8%) -Uncontrolled -Diagnosed: 6 months prior  -Current medications: Basaglar 16 units daily  -Medications previously tried: Glimepiride, Farxiga, Januvia  -Current home glucose readings Before supper: 157, typically 140-150s  -Denies hypoglycemic/hyperglycemic symptoms -Recommended to continue current medication  Depression/Anxiety (Goal: Achieve symptom remission) -Controlled -Current treatment: Escitalopram 10 mg daily  Melatonin 3 mg nightly  -Medications previously tried/failed: NA -PHQ9: 2 -Husband concerned with patient sleeping too much, patient endorses good quality sleep but agrees she sleeps a lot.  -STOP Melatonin.  DVT (Goal: Prevent clots) -Controlled -Current treatment  Eliquis 5 mg twice daily  -Medications previously tried: NA  -Patient started on Eliquis due to DVT, notes state to continue  until 04/30/2021. Does not appear to be dressed since then.  -Recommend stopping Eliquis at this point due to lack of evidence with continued use, starting aspirin 81 mg daily. Will discuss with PCP after labwork/ physical.   Patient Goals/Self-Care Activities Patient will:  - check glucose daily before breakfast, document, and provide at future appointments check blood pressure 2-3 times weekly, document, and provide at future appointments  Follow Up Plan: Telephone follow up appointment with care management team member scheduled for:  09/24/2022 at 3:00 PM       Medication Assistance: None required.  Patient affirms current coverage meets needs.  Compliance/Adherence/Medication fill history: Care Gaps: Urine Microalbumin Tetanus/TDAP Colonoscopy Mammogram Zoster Vaccines COVID-19 Vaccine Booster 2 PAP Smear Diabetic Eye Exam PNA Vaccine Dexa Scan A1C > 9  Star-Rating Drugs: Atorvastatin 80 mg last filled on 03/01/2022 for a 30-Day supply with [facility]  Patient's preferred pharmacy is:  [facility] - [place], [place] - [address] [place] [address] Phone: [phone] Fax: [phone]  Uses pill box? Yes Pt endorses 100% compliance  Patient decided to: Utilize [facility] for medication synchronization, packaging and delivery  Care Plan and Follow Up Patient Decision:  Patient agrees to Care Plan and Follow-up.  Plan: Telephone follow up appointment with care management team member scheduled for:  09/24/2022 at 3:00 PM  Junius Argyle, PharmD, Para [place], [place] Pharmacist Practitioner  [facility] [phone]

## 2022-09-26 ENCOUNTER — Encounter: Payer: Self-pay | Admitting: Physician Assistant

## 2022-09-27 NOTE — Patient Instructions (Signed)
Visit Information It was great speaking with you today!  Please let me know if you have any questions about our visit.   Goals Addressed             This Visit's Progress    Monitor and Manage My Blood Sugar-Diabetes Type 2   On track    Timeframe:  Long-Range Goal Priority:  High Start Date: 04/17/22                            Expected End Date: 04/18/23                      Follow Up within 30 days    - check blood sugar at prescribed times - check blood sugar before and after exercise - check blood sugar if I feel it is too high or too low - take the blood sugar log to all doctor visits    Why is this important?   Checking your blood sugar at home helps to keep it from getting very high or very low.  Writing the results in a diary or log helps the doctor know how to care for you.  Your blood sugar log should have the time, date and the results.  Also, write down the amount of insulin or other medicine that you take.  Other information, like what you ate, exercise done and how you were feeling, will also be helpful.     Notes:         Patient Care Plan: General Pharmacy (Adult)     Problem Identified: Hypertension, Hyperlipidemia, Diabetes, and History of CVA, History of DVT   Priority: High     Long-Range Goal: Patient-Specific Goal   Start Date: 04/17/2022  Expected End Date: 04/18/2023  This Visit's Progress: On track  Recent Progress: On track  Priority: High  Note:   Current Barriers:   Unable to self-manage medications as prescribed.   Pharmacist Clinical Goal(s):  Patient will achieve control of stroke as evidenced by control of HTN, HLD and T2DM through collaboration with PharmD and provider.   Interventions: 1:1 collaboration with [name], PA-C regarding development and update of comprehensive plan of care as evidenced by provider attestation and co-signature Inter-disciplinary care team collaboration (see longitudinal plan of  care) Comprehensive medication review performed; medication list updated in electronic medical record  Hypertension (BP goal <130/80) -Controlled -Current treatment: Amlodipine 10 mg daily  Metoprolol tartrate 50 mg twice daily  -Medications previously tried: NA  -Patient only taking metoprolol once daily, advised on importance of twice daily. Patient crushes all tablets so would not be able to switch to metoprolol succinate.  -Current home readings: Not monitoring regularly -Denies hypotensive/hypertensive symptoms -Recommended to continue current medication  Hyperlipidemia: (LDL goal < 70) -Uncontrolled -Current treatment: Atorvastatin 80 mg daily  -Current treatment: Eliquis 5 mg twice daily  -Medications previously tried: NA  -Weakness in legs, arms. Occupational therapy x1 weekly. PT 2x weekly.  -Recommended to continue current medication  Diabetes (A1c goal <8%) -Uncontrolled -Diagnosed: 6 months prior  -Current medications: Basaglar 16 units daily  -Medications previously tried: Glimepiride, Farxiga, Januvia  -Current home glucose readings Before supper: 157, typically 140-150s  -Denies hypoglycemic/hyperglycemic symptoms -Recommended to continue current medication  Depression/Anxiety (Goal: Achieve symptom remission) -Controlled -Current treatment: Escitalopram 10 mg daily  Melatonin 3 mg nightly  -Medications previously tried/failed: NA -PHQ9: 2 -Husband concerned with patient sleeping too much,  patient endorses good quality sleep but agrees she sleeps a lot.  -STOP Melatonin.  DVT (Goal: Prevent clots) -Controlled -Current treatment  Eliquis 5 mg twice daily  -Medications previously tried: NA  -Patient started on Eliquis due to DVT, notes state to continue until 04/30/2021. Does not appear to be dressed since then.  -Recommend stopping Eliquis at this point due to lack of evidence with continued use, starting aspirin 81 mg daily. Will discuss with PCP after  labwork/ physical.   Patient Goals/Self-Care Activities Patient will:  - check glucose daily before breakfast, document, and provide at future appointments check blood pressure 2-3 times weekly, document, and provide at future appointments  Follow Up Plan: Telephone follow up appointment with care management team member scheduled for:  09/24/2022 at 3:00 PM    Patient agreed to services and verbal consent obtained.   Patient verbalizes understanding of instructions and care plan provided today and agrees to view in MyChart. Active MyChart status and patient understanding of how to access instructions and care plan via MyChart confirmed with patient.     Angelena Sole, PharmD, Patsy [place], CPP  Clinical Pharmacist Practitioner  [facility] [phone]

## 2022-10-02 ENCOUNTER — Telehealth: Payer: Self-pay | Admitting: Physician Assistant

## 2022-10-02 NOTE — Telephone Encounter (Signed)
Copied from CRM [id number]. Topic: General - Other >> Oct 02, 2022 10:37 AM [place] B wrote: Reason for CRM: Patient's husband called in wanting to speak with Trinna Post about extra medical care for patient, wouldn't give any more info

## 2022-10-02 NOTE — Telephone Encounter (Signed)
Pts spouse would like Lillia Abed to give him a call / he needs to speak with her / please advise

## 2022-10-15 ENCOUNTER — Encounter: Payer: Self-pay | Admitting: Physician Assistant

## 2022-10-15 ENCOUNTER — Ambulatory Visit (INDEPENDENT_AMBULATORY_CARE_PROVIDER_SITE_OTHER): Payer: Medicare HMO | Admitting: Physician Assistant

## 2022-10-15 VITALS — BP 107/50 | HR 78 | Temp 97.9°F

## 2022-10-15 DIAGNOSIS — R112 Nausea with vomiting, unspecified: Secondary | ICD-10-CM

## 2022-10-15 DIAGNOSIS — I152 Hypertension secondary to endocrine disorders: Secondary | ICD-10-CM

## 2022-10-15 DIAGNOSIS — I639 Cerebral infarction, unspecified: Secondary | ICD-10-CM | POA: Diagnosis not present

## 2022-10-15 DIAGNOSIS — I6930 Unspecified sequelae of cerebral infarction: Secondary | ICD-10-CM | POA: Diagnosis not present

## 2022-10-15 DIAGNOSIS — R21 Rash and other nonspecific skin eruption: Secondary | ICD-10-CM | POA: Diagnosis not present

## 2022-10-15 DIAGNOSIS — Z794 Long term (current) use of insulin: Secondary | ICD-10-CM | POA: Diagnosis not present

## 2022-10-15 DIAGNOSIS — E1169 Type 2 diabetes mellitus with other specified complication: Secondary | ICD-10-CM

## 2022-10-15 DIAGNOSIS — E119 Type 2 diabetes mellitus without complications: Secondary | ICD-10-CM | POA: Diagnosis not present

## 2022-10-15 DIAGNOSIS — E1159 Type 2 diabetes mellitus with other circulatory complications: Secondary | ICD-10-CM | POA: Diagnosis not present

## 2022-10-15 MED ORDER — TRIAMCINOLONE ACETONIDE 0.1 % EX CREA: 1.0000 | TOPICAL_CREAM | Freq: Two times a day (BID) | CUTANEOUS | 0 refills | Status: AC

## 2022-10-15 MED ORDER — ONDANSETRON HCL 4 MG PO TABS: 4.0000 mg | ORAL_TABLET | Freq: Three times a day (TID) | ORAL | 0 refills | Status: DC | PRN

## 2022-10-15 MED ORDER — AMLODIPINE BESYLATE 5 MG PO TABS: 5.0000 mg | ORAL_TABLET | Freq: Every day | ORAL | 1 refills | Status: AC

## 2022-10-15 MED ORDER — NYSTATIN 100000 UNIT/GM EX CREA: 1.0000 | TOPICAL_CREAM | Freq: Two times a day (BID) | CUTANEOUS | 0 refills | Status: AC

## 2022-10-15 NOTE — Progress Notes (Unsigned)
Established patient visit   Patient: Samantha [name]   DOB: 1955-11-06   66 y.o. Female  MRN: [id number] Visit Date: 10/15/2022  Today's healthcare provider: [name], PA-C   No chief complaint on file.  Subjective    HPI  Diabetes Mellitus Type II, Follow-up  Lab Results  Component Value Date   HGBA1C 6.1 (H) 08/13/2022   HGBA1C 13.1 (H) 01/14/2022   HGBA1C 11.5 (A) 06/23/2021   Wt Readings from Last 3 Encounters:  04/13/22 276 lb (125.2 kg)  02/07/22 276 lb 7.3 oz (125.4 kg)  01/16/22 287 lb 14.7 oz (130.6 kg)   Last seen for diabetes 1 years ago.  Management since then includes continuing medications. She reports excellent compliance with treatment. She is not having side effects.  Symptoms: No fatigue No foot ulcerations  Yes appetite changes No nausea  No paresthesia of the feet  No polydipsia  No polyuria No visual disturbances   No vomiting     Home blood sugar records: postprandial range: 120-154  Episodes of hypoglycemia? No    Current insulin regiment: 16 units  Most Recent Eye Exam: n/a Current exercise: none Current diet habits: not asked  Pertinent Labs: Lab Results  Component Value Date   CHOL 194 08/13/2022   HDL 64 08/13/2022   LDLCALC 110 (H) 08/13/2022   TRIG 113 08/13/2022   CHOLHDL 3.0 08/13/2022   Lab Results  Component Value Date   NA 145 (H) 08/13/2022   K 3.8 08/13/2022   CREATININE 0.74 08/13/2022   EGFR 90 08/13/2022   LABMICR CANCELED 08/13/2022     --------------------------------------------------------------------------------------------------- Hypertension, follow-up  BP Readings from Last 3 Encounters:  04/13/22 121/87  02/12/22 (!) 140/52  01/17/22 (!) 150/71   Wt Readings from Last 3 Encounters:  04/13/22 276 lb (125.2 kg)  02/07/22 276 lb 7.3 oz (125.4 kg)  01/16/22 287 lb 14.7 oz (130.6 kg)     She was last seen for hypertension 1 years ago.  BP at that visit was 208/108. Management since that  visit includes continuing medication.  She reports excellent compliance with treatment. She is not having side effects. {document side effects if present:1} She is following a Regular diet. She is not exercising. She does not smoke.  Use of agents associated with hypertension: none.   Outside blood pressures are . Symptoms: No chest pain No chest pressure  No palpitations No syncope  No dyspnea No orthopnea  No paroxysmal nocturnal dyspnea No lower extremity edema   Pertinent labs Lab Results  Component Value Date   CHOL 194 08/13/2022   HDL 64 08/13/2022   LDLCALC 110 (H) 08/13/2022   TRIG 113 08/13/2022   CHOLHDL 3.0 08/13/2022   Lab Results  Component Value Date   NA 145 (H) 08/13/2022   K 3.8 08/13/2022   CREATININE 0.74 08/13/2022   EGFR 90 08/13/2022   GLUCOSE 187 (H) 08/13/2022   TSH 1.590 11/18/2019     The ASCVD Risk score (Arnett DK, et al., 2019) failed to calculate for the following reasons:   The patient has a prior MI or stroke diagnosis  ---------------------------------------------------------------------------------------------------  Lipid/Cholesterol, Follow-up  Last lipid panel Other pertinent labs  Lab Results  Component Value Date   CHOL 194 08/13/2022   HDL 64 08/13/2022   LDLCALC 110 (H) 08/13/2022   TRIG 113 08/13/2022   CHOLHDL 3.0 08/13/2022   Lab Results  Component Value Date   ALT 30 08/13/2022   AST 35  08/13/2022   PLT 211 08/13/2022   TSH 1.590 11/18/2019     She was last seen for this 1 years ago.  Management since that visit includes continuing medication.  She reports good compliance with treatment. She is not having side effects. {document side effects if present:1}  Symptoms: No chest pain No chest pressure/discomfort  No dyspnea No lower extremity edema  No numbness or tingling of extremity No orthopnea  No palpitations No paroxysmal nocturnal dyspnea  No speech difficulty No syncope   Current diet: not  asked Current exercise: none  The ASCVD Risk score (Arnett DK, et al., 2019) failed to calculate for the following reasons:   The patient has a prior MI or stroke diagnosis  ---------------------------------------------------------------------------------------------------  Medications: Outpatient Medications Prior to Visit  Medication Sig   Accu-Chek Softclix Lancets lancets Use to test blood sugars 4 times daily.   acetaminophen (TYLENOL) 325 MG tablet Take 2 tablets (650 mg total) by mouth every 4 (four) hours as needed for mild pain (or temp > 37.5 C (99.5 F)).   amLODipine (NORVASC) 10 MG tablet Take 1 tablet (10 mg total) by mouth daily.   aspirin (ASPIRIN 81) 81 MG chewable tablet Chew 1 tablet (81 mg total) by mouth daily.   atorvastatin (LIPITOR) 80 MG tablet Take 1 tablet (80 mg total) by mouth daily at 6 PM.   Baclofen 5 MG TABS Take 5 mg by mouth at bedtime.   Blood Pressure Monitoring DEVI Check bp in AM and PM   cyanocobalamin 1000 MCG tablet Take 1,000 mcg by mouth daily. Gummy   escitalopram (LEXAPRO) 10 MG tablet Take 1 tablet (10 mg total) by mouth daily.   glucose blood (ACCU-CHEK GUIDE) test strip Use to test blood sugars 4 times daily.   Insulin Glargine (BASAGLAR KWIKPEN) 100 UNIT/ML Inject 16 Units into the skin daily.   Insulin Pen Needle 32G X 4 MM MISC Use to inject insulin up to 4 times daily.   loperamide (IMODIUM A-D) 2 MG tablet Take by mouth.   metoprolol tartrate (LOPRESSOR) 50 MG tablet Take 1 tablet (50 mg total) by mouth 2 (two) times daily.   No facility-administered medications prior to visit.    Review of Systems  {Labs  Heme  Chem  Endocrine  Serology  Results Review (optional):23779}   Objective    There were no vitals taken for this visit. {Show previous vital signs (optional):23777}  Physical Exam  ***  No results found for any visits on 10/15/22.  Assessment & Plan     ***  No follow-ups on file.      {provider  attestation***:1}   [name], PA-C  [facility] [phone] (phone) [phone] (fax)  [place]

## 2022-10-16 ENCOUNTER — Encounter: Payer: Self-pay | Admitting: Physician Assistant

## 2022-10-16 ENCOUNTER — Telehealth: Payer: Self-pay

## 2022-10-16 DIAGNOSIS — R112 Nausea with vomiting, unspecified: Secondary | ICD-10-CM | POA: Insufficient documentation

## 2022-10-16 DIAGNOSIS — R21 Rash and other nonspecific skin eruption: Secondary | ICD-10-CM | POA: Insufficient documentation

## 2022-10-16 DIAGNOSIS — I6930 Unspecified sequelae of cerebral infarction: Secondary | ICD-10-CM | POA: Insufficient documentation

## 2022-10-16 LAB — GLUCOSE, POCT (MANUAL RESULT ENTRY): POC Glucose: 142 mg/dl — AB (ref 70–99)

## 2022-10-16 NOTE — Assessment & Plan Note (Addendum)
Pt became acutely nauseous during appt, poc glucose was 142. Unclear why she is nauseous, does not appear to be related to hypoglycemic episodes;  Discussion on improvement of eating habits, rx zofran for nausea.  Would really prefer to refer to GI, may need repeat swallowing eval, but they decline for now. Pt and husband deny Juli having issues swallowing or choking

## 2022-10-16 NOTE — Assessment & Plan Note (Signed)
Very difficult to fully visualize d/t pt's positioning and lack of movement.  Likely intertrigo, rx nystatin and triamcinolone

## 2022-10-16 NOTE — Assessment & Plan Note (Signed)
Hypotensive on exam. Advised decreasing amlodipine from 10 mg to 5 mg. Advised to check pressure at home

## 2022-10-16 NOTE — Telephone Encounter (Signed)
Spoke with patient's husband, Renae Fickle regarding [employer]ia Pouch saying she her policy was terminated on 08/29/22 and also tried to schedule a 6 week f/u appt. With [name]. He said he would have to get back with [place] to get the appt. Rescheduled.   He is going to call Monia Pouch to find out about the insurance being terminated.

## 2022-10-16 NOTE — Telephone Encounter (Signed)
-----   Message from [name], PA-C sent at 10/16/2022 10:59 AM EST ----- Can we schedule her for an appt in 6 weeks please

## 2022-10-16 NOTE — Assessment & Plan Note (Addendum)
Reviewed labs. Fasting values seem appropriate keep current insulin regimen.  Foot exam deferred today d/t pt positioning and complexity of appt. Uacr deferred as pt is unable to ambulate to restroom  Declines vaccines today

## 2022-10-16 NOTE — Telephone Encounter (Signed)
Patient's husband asked for office to call [employer] to verify patient's insurance.[phone]

## 2022-10-16 NOTE — Assessment & Plan Note (Addendum)
Significant stiffness, weakness, nearly immobile right side. Pt has some movement of her right arm, no movement of R lower extremity. Likely contributing to edema, advised compression socks. Extensive conversation on keeping appts with specialists, f/u with cardiology and neurology.  Continue ASA 81 mg daily.  Ordered home pt, ot. Pt declines speech therapy.  Unclear if her mood is altered d/t deficits from stroke or depression. Husband denies pt is depressed.  Will try to coordinate social work as well.

## 2022-10-24 ENCOUNTER — Ambulatory Visit: Payer: Self-pay | Admitting: *Deleted

## 2022-10-24 NOTE — Telephone Encounter (Signed)
Please advise 

## 2022-10-24 NOTE — Telephone Encounter (Signed)
Summary: cough and congestion   Patient's husband called in asking can something else be sent in , because the Benzotate she was prescriped doesn't seem to be completely helping with her cough and she is congested. Patient had an appt already on Dec 18         Chief Complaint: Cough Symptoms: Husband calling, pt with dry cough "Just coughs a little throughout day. Seen 10/15/22 States cough a little better with Benzonatate but still coughing, dry. Reports some chest congestion. Denies any other symptoms. Frequency: 10/15/22, some improvement. Pertinent Negatives: Patient denies SOB fever Disposition: [] ED /[] Urgent Care (no appt availability in office) / [] Appointment(In office/virtual)/ []  [facility]/ [] Home Care/ [] Refused Recommended Disposition /[] [facility]/ [x]  Follow-up with PCP Additional Notes: Offered appt, stated hard to get pt in to OV, offered virtual, declined. "Just wondered if they would call something else in." Care advise provided, verbalizes understanding.  Please advise.  Reason for Disposition  [1] Continuous (nonstop) coughing interferes with work or school AND [2] no improvement using cough treatment per Care Advice  Answer Assessment - Initial Assessment Questions 1. ONSET: "When did the cough begin?"      Seen on 10/15/22 2. SEVERITY: "How bad is the cough today?"      Just coughs a lot 3. SPUTUM: "Describe the color of your sputum" (none, dry cough; clear, white, yellow, green)     unsure 4. HEMOPTYSIS: "Are you coughing up any blood?" If so ask: "How much?" (flecks, streaks, tablespoons, etc.)     unsure 5. DIFFICULTY BREATHING: "Are you having difficulty breathing?" If Yes, ask: "How bad is it?" (e.g., mild, moderate, severe)    - MILD: No SOB at rest, mild SOB with walking, speaks normally in sentences, can lie down, no retractions, pulse < 100.    - MODERATE: SOB at rest, SOB with minimal exertion and prefers to sit, cannot lie down  flat, speaks in phrases, mild retractions, audible wheezing, pulse 100-120.    - SEVERE: Very SOB at rest, speaks in single words, struggling to breathe, sitting hunched forward, retractions, pulse > 120      varies 6. FEVER: "Do you have a fever?" If Yes, ask: "What is your temperature, how was it measured, and when did it start?"     No 7. CARDIAC HISTORY: "Do you have any history of heart disease?" (e.g., heart attack, congestive heart failure)      *No Answer* 8. LUNG HISTORY: "Do you have any history of lung disease?"  (e.g., pulmonary embolus, asthma, emphysema)     *No Answer* 9. PE RISK FACTORS: "Do you have a history of blood clots?" (or: recent major surgery, recent prolonged travel, bedridden)     *No Answer* 10. OTHER SYMPTOMS: "Do you have any other symptoms?" (e.g., runny nose, wheezing, chest pain)     Congestion  Protocols used: Cough - Acute Non-Productive-A-AH

## 2022-10-26 MED ORDER — DEXTROMETHORPHAN HBR 15 MG/5ML PO SYRP: 10.0000 mL | ORAL_SOLUTION | Freq: Four times a day (QID) | ORAL | 0 refills | Status: AC | PRN

## 2022-10-26 NOTE — Addendum Note (Signed)
Addended by: Bing Neighbors on: 10/26/2022 08:25 AM   Modules accepted: Orders

## 2022-10-26 NOTE — Telephone Encounter (Signed)
RX for cough syrup sent to pharmacy    Ronnald Ramp, MD  [facility]

## 2022-11-05 ENCOUNTER — Telehealth: Payer: Self-pay

## 2022-11-05 NOTE — Progress Notes (Cosign Needed)
Care Management & Coordination Services Pharmacy Team  Reason for Encounter: Medication coordination and delivery  Contacted patient on 11/05/2022 to discuss medications   Recent office visits:  10/24/2022 Dr. Neita Garnet MD (PCP Office) Start dextromethorphan 15 MG/5ML syrup PRN 10/15/2022 [name] PA-C (PCP) Decrease Amlodipine to 5 mg daily, Start nystatin cream 100000 Unit/GM 2 times daily, start triamcinolone cream 0.1% 2 times daily, start Zofran 4 mg PRN,  Ambulatory referral to Home Health ,Ambulatory referral to Cardiology, Ambulatory referral to Neurology, Return in about 6 weeks    Recent consult visits:  None ID  Hospital visits:  None in previous 6 months  Medications: Outpatient Encounter Medications as of 11/05/2022  Medication Sig   Accu-Chek Softclix Lancets lancets Use to test blood sugars 4 times daily.   acetaminophen (TYLENOL) 325 MG tablet Take 2 tablets (650 mg total) by mouth every 4 (four) hours as needed for mild pain (or temp > 37.5 C (99.5 F)).   amLODipine (NORVASC) 5 MG tablet Take 1 tablet (5 mg total) by mouth daily.   aspirin (ASPIRIN 81) 81 MG chewable tablet Chew 1 tablet (81 mg total) by mouth daily.   atorvastatin (LIPITOR) 80 MG tablet Take 1 tablet (80 mg total) by mouth daily at 6 PM.   Baclofen 5 MG TABS Take 5 mg by mouth at bedtime.   Blood Pressure Monitoring DEVI Check bp in AM and PM   cyanocobalamin 1000 MCG tablet Take 1,000 mcg by mouth daily. Gummy   dextromethorphan 15 MG/5ML syrup Take 10 mLs (30 mg total) by mouth 4 (four) times daily as needed for cough.   escitalopram (LEXAPRO) 10 MG tablet Take 1 tablet (10 mg total) by mouth daily.   glucose blood (ACCU-CHEK GUIDE) test strip Use to test blood sugars 4 times daily.   Insulin Glargine (BASAGLAR KWIKPEN) 100 UNIT/ML Inject 16 Units into the skin daily.   Insulin Pen Needle 32G X 4 MM MISC Use to inject insulin up to 4 times daily.   loperamide (IMODIUM A-D) 2 MG tablet  Take by mouth.   metoprolol tartrate (LOPRESSOR) 50 MG tablet Take 1 tablet (50 mg total) by mouth 2 (two) times daily.   nystatin cream (MYCOSTATIN) Apply 1 Application topically 2 (two) times daily. Use on area of concern. Can use with steroid cream   ondansetron (ZOFRAN) 4 MG tablet Take 1 tablet (4 mg total) by mouth every 8 (eight) hours as needed for nausea or vomiting.   triamcinolone cream (KENALOG) 0.1 % Apply 1 Application topically 2 (two) times daily. Use on area of concern. Can use with nystatin. Do not use for more than 10 days in a row   No facility-administered encounter medications on file as of 11/05/2022.   BP Readings from Last 3 Encounters:  10/15/22 (!) 107/50  04/13/22 121/87  02/12/22 (!) 140/52    Pulse Readings from Last 3 Encounters:  10/15/22 78  04/13/22 67  02/12/22 67    Lab Results  Component Value Date/Time   HGBA1C 6.1 (H) 08/13/2022 11:16 AM   HGBA1C 13.1 (H) 01/14/2022 02:16 AM   Lab Results  Component Value Date   CREATININE 0.74 08/13/2022   BUN 8 08/13/2022   GFRNONAA 48 (L) 02/09/2022   GFRAA 84 11/18/2019   NA 145 (H) 08/13/2022   K 3.8 08/13/2022   CALCIUM 9.0 08/13/2022   CO2 22 08/13/2022   Care Gaps: Tetanus/TDAP Diabetic Eye Exam Influenza Vaccine Diabetic Foot Exam COVID-19 Vaccine AWV  Star Rating Drugs: Atorvastatin 80 mg last filled on 08/30/2022 for an 18-Day supply with [facility]   Last adherence delivery date was on :10/16/2022  Patient is due for next adherence delivery on: 11/15/2022  Spoke with patient on 11/05/2022 reviewed medications and coordinated delivery.  This delivery to include: Adherence Packaging  30 Days   Amlodipine 5 mg 1 tablet daily (Evening Meal) Atorvastatin 80 mg 1 tablet daily (Evening Meal) Metoprolol 50 mg twice daily (Breakfast, Evening Meal) Aspirin Chew 81 mg 1 tablet daily (Evening Meal) Baclofen 5 mg 1 tablet daily (Evening Meal) Escitalopram 10 mg 1 tablet daily  (Evening Meal) Basaglar 100-U Inject 16 U daily Needles 32G X 5/32" Use 4 times daily  Patient declined the following medications this month: nystatin cream 100000 Unit/GM 2 times daily-Adequate supply triamcinolone cream 0.1% 2 times daily-Adequate supply Zofran 4 mg PRN  No refill request needed.  Confirmed delivery date of 11/15/2022 (Second route, after 3:00 pm), advised patient that pharmacy will contact them the morning of delivery.   Any concerns about your medications? No  How often do you forget or accidentally miss a dose? Never  Do you use a pillbox? No  Is patient in packaging Yes    Cycle dispensing form sent to Daron Offer for review.  [place] Pharmacist Assistant [phone]

## 2022-11-12 ENCOUNTER — Other Ambulatory Visit: Payer: Self-pay | Admitting: Physician Assistant

## 2022-11-12 ENCOUNTER — Encounter: Payer: Self-pay | Admitting: Physician Assistant

## 2022-11-12 DIAGNOSIS — R112 Nausea with vomiting, unspecified: Secondary | ICD-10-CM

## 2022-11-12 MED ORDER — ONDANSETRON 4 MG PO TBDP: 4.0000 mg | ORAL_TABLET | Freq: Three times a day (TID) | ORAL | 0 refills | Status: AC | PRN

## 2022-11-14 DIAGNOSIS — L24A2 Irritant contact dermatitis due to fecal, urinary or dual incontinence: Secondary | ICD-10-CM | POA: Diagnosis not present

## 2022-11-14 DIAGNOSIS — I69351 Hemiplegia and hemiparesis following cerebral infarction affecting right dominant side: Secondary | ICD-10-CM | POA: Diagnosis not present

## 2022-11-14 DIAGNOSIS — L24A9 Irritant contact dermatitis due friction or contact with other specified body fluids: Secondary | ICD-10-CM | POA: Diagnosis not present

## 2022-11-14 DIAGNOSIS — I152 Hypertension secondary to endocrine disorders: Secondary | ICD-10-CM | POA: Diagnosis not present

## 2022-11-14 DIAGNOSIS — L89312 Pressure ulcer of right buttock, stage 2: Secondary | ICD-10-CM | POA: Diagnosis not present

## 2022-11-14 DIAGNOSIS — L304 Erythema intertrigo: Secondary | ICD-10-CM | POA: Diagnosis not present

## 2022-11-14 DIAGNOSIS — E1159 Type 2 diabetes mellitus with other circulatory complications: Secondary | ICD-10-CM | POA: Diagnosis not present

## 2022-11-14 DIAGNOSIS — R112 Nausea with vomiting, unspecified: Secondary | ICD-10-CM | POA: Diagnosis not present

## 2022-11-14 DIAGNOSIS — R600 Localized edema: Secondary | ICD-10-CM | POA: Diagnosis not present

## 2022-11-15 DIAGNOSIS — E119 Type 2 diabetes mellitus without complications: Secondary | ICD-10-CM | POA: Diagnosis not present

## 2022-11-15 DIAGNOSIS — I639 Cerebral infarction, unspecified: Secondary | ICD-10-CM | POA: Diagnosis not present

## 2022-11-16 ENCOUNTER — Telehealth: Payer: Self-pay | Admitting: Physician Assistant

## 2022-11-16 NOTE — Telephone Encounter (Signed)
Home Health Verbal Orders - Caller/Agency: Donalynn Furlong with [facility] Number: [phone] Requesting OT/PT/Skilled Nursing/Social Work/Speech Therapy:  Frequency:   PT  1w8  Skilled nursing for skin assessment, patient has two open areas on her skin   Hospital Bed

## 2022-11-16 NOTE — Telephone Encounter (Signed)
Please Advise

## 2022-11-18 ENCOUNTER — Emergency Department (HOSPITAL_COMMUNITY): Payer: Medicare HMO

## 2022-11-18 ENCOUNTER — Inpatient Hospital Stay (HOSPITAL_COMMUNITY)
Admission: EM | Admit: 2022-11-18 | Discharge: 2022-12-28 | DRG: 853 | Disposition: E | Payer: Medicare HMO | Attending: Internal Medicine | Admitting: Internal Medicine

## 2022-11-18 ENCOUNTER — Other Ambulatory Visit: Payer: Self-pay

## 2022-11-18 DIAGNOSIS — L304 Erythema intertrigo: Secondary | ICD-10-CM | POA: Diagnosis present

## 2022-11-18 DIAGNOSIS — R5383 Other fatigue: Secondary | ICD-10-CM | POA: Diagnosis present

## 2022-11-18 DIAGNOSIS — I6930 Unspecified sequelae of cerebral infarction: Secondary | ICD-10-CM

## 2022-11-18 DIAGNOSIS — N390 Urinary tract infection, site not specified: Secondary | ICD-10-CM | POA: Diagnosis present

## 2022-11-18 DIAGNOSIS — N764 Abscess of vulva: Secondary | ICD-10-CM | POA: Diagnosis present

## 2022-11-18 DIAGNOSIS — L89326 Pressure-induced deep tissue damage of left buttock: Secondary | ICD-10-CM | POA: Diagnosis present

## 2022-11-18 DIAGNOSIS — Z6841 Body Mass Index (BMI) 40.0 and over, adult: Secondary | ICD-10-CM

## 2022-11-18 DIAGNOSIS — I82411 Acute embolism and thrombosis of right femoral vein: Secondary | ICD-10-CM | POA: Diagnosis not present

## 2022-11-18 DIAGNOSIS — Z1624 Resistance to multiple antibiotics: Secondary | ICD-10-CM | POA: Diagnosis present

## 2022-11-18 DIAGNOSIS — I152 Hypertension secondary to endocrine disorders: Secondary | ICD-10-CM | POA: Diagnosis present

## 2022-11-18 DIAGNOSIS — R6521 Severe sepsis with septic shock: Secondary | ICD-10-CM | POA: Diagnosis present

## 2022-11-18 DIAGNOSIS — E872 Acidosis, unspecified: Secondary | ICD-10-CM | POA: Diagnosis present

## 2022-11-18 DIAGNOSIS — I471 Supraventricular tachycardia, unspecified: Secondary | ICD-10-CM | POA: Diagnosis not present

## 2022-11-18 DIAGNOSIS — E1165 Type 2 diabetes mellitus with hyperglycemia: Secondary | ICD-10-CM | POA: Diagnosis present

## 2022-11-18 DIAGNOSIS — E43 Unspecified severe protein-calorie malnutrition: Secondary | ICD-10-CM | POA: Diagnosis present

## 2022-11-18 DIAGNOSIS — L89312 Pressure ulcer of right buttock, stage 2: Secondary | ICD-10-CM | POA: Diagnosis present

## 2022-11-18 DIAGNOSIS — Z8249 Family history of ischemic heart disease and other diseases of the circulatory system: Secondary | ICD-10-CM

## 2022-11-18 DIAGNOSIS — E875 Hyperkalemia: Secondary | ICD-10-CM | POA: Diagnosis present

## 2022-11-18 DIAGNOSIS — Z515 Encounter for palliative care: Secondary | ICD-10-CM

## 2022-11-18 DIAGNOSIS — L02215 Cutaneous abscess of perineum: Secondary | ICD-10-CM | POA: Diagnosis present

## 2022-11-18 DIAGNOSIS — R404 Transient alteration of awareness: Secondary | ICD-10-CM | POA: Diagnosis not present

## 2022-11-18 DIAGNOSIS — Z79899 Other long term (current) drug therapy: Secondary | ICD-10-CM

## 2022-11-18 DIAGNOSIS — E876 Hypokalemia: Secondary | ICD-10-CM | POA: Diagnosis not present

## 2022-11-18 DIAGNOSIS — E785 Hyperlipidemia, unspecified: Secondary | ICD-10-CM | POA: Diagnosis present

## 2022-11-18 DIAGNOSIS — E860 Dehydration: Secondary | ICD-10-CM | POA: Diagnosis present

## 2022-11-18 DIAGNOSIS — I493 Ventricular premature depolarization: Secondary | ICD-10-CM | POA: Diagnosis not present

## 2022-11-18 DIAGNOSIS — R627 Adult failure to thrive: Secondary | ICD-10-CM | POA: Diagnosis present

## 2022-11-18 DIAGNOSIS — I129 Hypertensive chronic kidney disease with stage 1 through stage 4 chronic kidney disease, or unspecified chronic kidney disease: Secondary | ICD-10-CM | POA: Diagnosis present

## 2022-11-18 DIAGNOSIS — I69391 Dysphagia following cerebral infarction: Secondary | ICD-10-CM

## 2022-11-18 DIAGNOSIS — G9340 Encephalopathy, unspecified: Secondary | ICD-10-CM

## 2022-11-18 DIAGNOSIS — D631 Anemia in chronic kidney disease: Secondary | ICD-10-CM | POA: Diagnosis present

## 2022-11-18 DIAGNOSIS — Z7982 Long term (current) use of aspirin: Secondary | ICD-10-CM

## 2022-11-18 DIAGNOSIS — R9431 Abnormal electrocardiogram [ECG] [EKG]: Secondary | ICD-10-CM | POA: Diagnosis not present

## 2022-11-18 DIAGNOSIS — E1122 Type 2 diabetes mellitus with diabetic chronic kidney disease: Secondary | ICD-10-CM | POA: Diagnosis present

## 2022-11-18 DIAGNOSIS — Z86718 Personal history of other venous thrombosis and embolism: Secondary | ICD-10-CM

## 2022-11-18 DIAGNOSIS — R4182 Altered mental status, unspecified: Secondary | ICD-10-CM

## 2022-11-18 DIAGNOSIS — R000 Tachycardia, unspecified: Secondary | ICD-10-CM | POA: Diagnosis not present

## 2022-11-18 DIAGNOSIS — N261 Atrophy of kidney (terminal): Secondary | ICD-10-CM | POA: Diagnosis not present

## 2022-11-18 DIAGNOSIS — R34 Anuria and oliguria: Secondary | ICD-10-CM | POA: Diagnosis not present

## 2022-11-18 DIAGNOSIS — A419 Sepsis, unspecified organism: Secondary | ICD-10-CM

## 2022-11-18 DIAGNOSIS — Z66 Do not resuscitate: Secondary | ICD-10-CM | POA: Diagnosis not present

## 2022-11-18 DIAGNOSIS — N170 Acute kidney failure with tubular necrosis: Secondary | ICD-10-CM | POA: Diagnosis present

## 2022-11-18 DIAGNOSIS — Z743 Need for continuous supervision: Secondary | ICD-10-CM | POA: Diagnosis not present

## 2022-11-18 DIAGNOSIS — E1169 Type 2 diabetes mellitus with other specified complication: Secondary | ICD-10-CM | POA: Diagnosis present

## 2022-11-18 DIAGNOSIS — G9341 Metabolic encephalopathy: Secondary | ICD-10-CM | POA: Diagnosis present

## 2022-11-18 DIAGNOSIS — I700 Atherosclerosis of aorta: Secondary | ICD-10-CM | POA: Diagnosis not present

## 2022-11-18 DIAGNOSIS — R6520 Severe sepsis without septic shock: Secondary | ICD-10-CM

## 2022-11-18 DIAGNOSIS — I82729 Chronic embolism and thrombosis of deep veins of unspecified upper extremity: Secondary | ICD-10-CM

## 2022-11-18 DIAGNOSIS — I82409 Acute embolism and thrombosis of unspecified deep veins of unspecified lower extremity: Secondary | ICD-10-CM

## 2022-11-18 DIAGNOSIS — Z794 Long term (current) use of insulin: Secondary | ICD-10-CM

## 2022-11-18 DIAGNOSIS — N179 Acute kidney failure, unspecified: Secondary | ICD-10-CM | POA: Diagnosis present

## 2022-11-18 DIAGNOSIS — I7789 Other specified disorders of arteries and arterioles: Secondary | ICD-10-CM | POA: Diagnosis not present

## 2022-11-18 DIAGNOSIS — N200 Calculus of kidney: Secondary | ICD-10-CM | POA: Diagnosis not present

## 2022-11-18 DIAGNOSIS — L24A2 Irritant contact dermatitis due to fecal, urinary or dual incontinence: Secondary | ICD-10-CM | POA: Diagnosis present

## 2022-11-18 DIAGNOSIS — I4891 Unspecified atrial fibrillation: Secondary | ICD-10-CM | POA: Diagnosis not present

## 2022-11-18 DIAGNOSIS — E8809 Other disorders of plasma-protein metabolism, not elsewhere classified: Secondary | ICD-10-CM | POA: Diagnosis present

## 2022-11-18 DIAGNOSIS — I2510 Atherosclerotic heart disease of native coronary artery without angina pectoris: Secondary | ICD-10-CM | POA: Diagnosis not present

## 2022-11-18 DIAGNOSIS — Z880 Allergy status to penicillin: Secondary | ICD-10-CM

## 2022-11-18 DIAGNOSIS — Z1152 Encounter for screening for COVID-19: Secondary | ICD-10-CM

## 2022-11-18 DIAGNOSIS — D6959 Other secondary thrombocytopenia: Secondary | ICD-10-CM | POA: Diagnosis present

## 2022-11-18 DIAGNOSIS — D696 Thrombocytopenia, unspecified: Secondary | ICD-10-CM

## 2022-11-18 DIAGNOSIS — R0681 Apnea, not elsewhere classified: Secondary | ICD-10-CM | POA: Diagnosis not present

## 2022-11-18 DIAGNOSIS — A4151 Sepsis due to Escherichia coli [E. coli]: Principal | ICD-10-CM | POA: Diagnosis present

## 2022-11-18 DIAGNOSIS — L8990 Pressure ulcer of unspecified site, unspecified stage: Secondary | ICD-10-CM | POA: Insufficient documentation

## 2022-11-18 DIAGNOSIS — I69351 Hemiplegia and hemiparesis following cerebral infarction affecting right dominant side: Secondary | ICD-10-CM

## 2022-11-18 DIAGNOSIS — E8770 Fluid overload, unspecified: Secondary | ICD-10-CM | POA: Diagnosis not present

## 2022-11-18 DIAGNOSIS — F32A Depression, unspecified: Secondary | ICD-10-CM | POA: Diagnosis present

## 2022-11-18 DIAGNOSIS — D62 Acute posthemorrhagic anemia: Secondary | ICD-10-CM | POA: Diagnosis present

## 2022-11-18 DIAGNOSIS — I69320 Aphasia following cerebral infarction: Secondary | ICD-10-CM

## 2022-11-18 DIAGNOSIS — N1832 Chronic kidney disease, stage 3b: Secondary | ICD-10-CM | POA: Diagnosis present

## 2022-11-18 DIAGNOSIS — R799 Abnormal finding of blood chemistry, unspecified: Secondary | ICD-10-CM

## 2022-11-18 HISTORY — DX: Hyperlipidemia, unspecified: E78.5

## 2022-11-18 HISTORY — DX: Cerebral infarction, unspecified: I63.9

## 2022-11-18 HISTORY — DX: Essential (primary) hypertension: I10

## 2022-11-18 HISTORY — DX: Type 2 diabetes mellitus without complications: E11.9

## 2022-11-18 HISTORY — DX: Acute embolism and thrombosis of unspecified deep veins of unspecified lower extremity: I82.409

## 2022-11-18 LAB — COMPREHENSIVE METABOLIC PANEL
ALT: 43 U/L (ref 0–44)
AST: 63 U/L — ABNORMAL HIGH (ref 15–41)
Albumin: <1.5 g/dL — ABNORMAL LOW (ref 3.5–5.0)
Alkaline Phosphatase: 170 U/L — ABNORMAL HIGH (ref 38–126)
Anion gap: 15 (ref 5–15)
BUN: 48 mg/dL — ABNORMAL HIGH (ref 8–23)
CO2: 21 mmol/L — ABNORMAL LOW (ref 22–32)
Calcium: 8.3 mg/dL — ABNORMAL LOW (ref 8.9–10.3)
Chloride: 102 mmol/L (ref 98–111)
Creatinine, Ser: 4.12 mg/dL — ABNORMAL HIGH (ref 0.44–1.00)
GFR, Estimated: 11 mL/min — ABNORMAL LOW (ref >60)
Glucose, Bld: 226 mg/dL — ABNORMAL HIGH (ref 70–99)
Potassium: 5.4 mmol/L — ABNORMAL HIGH (ref 3.5–5.1)
Sodium: 138 mmol/L (ref 135–145)
Total Bilirubin: 1.9 mg/dL — ABNORMAL HIGH (ref 0.3–1.2)
Total Protein: 5.7 g/dL — ABNORMAL LOW (ref 6.5–8.1)

## 2022-11-18 LAB — CBC WITH DIFFERENTIAL/PLATELET
Abs Immature Granulocytes: 0.38 10*3/uL — ABNORMAL HIGH (ref 0.00–0.07)
Basophils Absolute: 0.1 10*3/uL (ref 0.0–0.1)
Basophils Relative: 0 %
Eosinophils Absolute: 0 10*3/uL (ref 0.0–0.5)
Eosinophils Relative: 0 %
HCT: 35.2 % — ABNORMAL LOW (ref 36.0–46.0)
Hemoglobin: 11.5 g/dL — ABNORMAL LOW (ref 12.0–15.0)
Immature Granulocytes: 2 %
Lymphocytes Relative: 3 %
Lymphs Abs: 0.7 10*3/uL (ref 0.7–4.0)
MCH: 35.9 pg — ABNORMAL HIGH (ref 26.0–34.0)
MCHC: 32.7 g/dL (ref 30.0–36.0)
MCV: 110 fL — ABNORMAL HIGH (ref 80.0–100.0)
Monocytes Absolute: 0.5 10*3/uL (ref 0.1–1.0)
Monocytes Relative: 2 %
Neutro Abs: 23.5 10*3/uL — ABNORMAL HIGH (ref 1.7–7.7)
Neutrophils Relative %: 93 %
Platelets: 94 10*3/uL — ABNORMAL LOW (ref 150–400)
RBC: 3.2 MIL/uL — ABNORMAL LOW (ref 3.87–5.11)
RDW: 16.9 % — ABNORMAL HIGH (ref 11.5–15.5)
WBC: 25.2 10*3/uL — ABNORMAL HIGH (ref 4.0–10.5)
nRBC: 0.1 % (ref 0.0–0.2)

## 2022-11-18 LAB — RESP PANEL BY RT-PCR (RSV, FLU A&B, COVID)  RVPGX2
Influenza A by PCR: NEGATIVE
Influenza B by PCR: NEGATIVE
Resp Syncytial Virus by PCR: NEGATIVE
SARS Coronavirus 2 by RT PCR: NEGATIVE

## 2022-11-18 LAB — URINALYSIS, ROUTINE W REFLEX MICROSCOPIC
Glucose, UA: NEGATIVE mg/dL
Ketones, ur: NEGATIVE mg/dL
Nitrite: NEGATIVE
Protein, ur: 100 mg/dL — AB
Specific Gravity, Urine: 1.025 (ref 1.005–1.030)
pH: 5.5 (ref 5.0–8.0)

## 2022-11-18 LAB — PROTIME-INR
INR: 1.5 — ABNORMAL HIGH (ref 0.8–1.2)
Prothrombin Time: 18 seconds — ABNORMAL HIGH (ref 11.4–15.2)

## 2022-11-18 LAB — TROPONIN I (HIGH SENSITIVITY)
Troponin I (High Sensitivity): 213 ng/L (ref <18)
Troponin I (High Sensitivity): 33 ng/L — ABNORMAL HIGH (ref <18)

## 2022-11-18 LAB — URINALYSIS, MICROSCOPIC (REFLEX): WBC, UA: >50 WBC/hpf (ref 0–5)

## 2022-11-18 LAB — LACTIC ACID, PLASMA
Lactic Acid, Venous: 5.3 mmol/L (ref 0.5–1.9)
Lactic Acid, Venous: 6.3 mmol/L (ref 0.5–1.9)

## 2022-11-18 LAB — CBG MONITORING, ED: Glucose-Capillary: 172 mg/dL — ABNORMAL HIGH (ref 70–99)

## 2022-11-18 LAB — APTT: aPTT: 30 seconds (ref 24–36)

## 2022-11-18 MED ORDER — SODIUM CHLORIDE 0.9 % IV SOLN
2.0000 g | INTRAVENOUS | Status: DC
  Administered 2022-11-19 – 2022-11-25 (×7): 2 g via INTRAVENOUS
  Filled 2022-11-18 (×7): qty 12.5

## 2022-11-18 MED ORDER — VANCOMYCIN VARIABLE DOSE PER UNSTABLE RENAL FUNCTION (PHARMACIST DOSING): Status: DC

## 2022-11-18 MED ORDER — ONDANSETRON HCL 4 MG/2ML IJ SOLN
4.0000 mg | Freq: Once | INTRAMUSCULAR | Status: AC
  Administered 2022-11-18: 4 mg via INTRAVENOUS
  Filled 2022-11-18: qty 2

## 2022-11-18 MED ORDER — VANCOMYCIN HCL IN DEXTROSE 1-5 GM/200ML-% IV SOLN
1000.0000 mg | Freq: Once | INTRAVENOUS | Status: AC
  Administered 2022-11-18: 1000 mg via INTRAVENOUS
  Filled 2022-11-18: qty 200

## 2022-11-18 MED ORDER — METRONIDAZOLE 500 MG/100ML IV SOLN
500.0000 mg | Freq: Once | INTRAVENOUS | Status: AC
  Administered 2022-11-18: 500 mg via INTRAVENOUS
  Filled 2022-11-18: qty 100

## 2022-11-18 MED ORDER — SODIUM CHLORIDE 0.9 % IV SOLN: 2.0000 g | Freq: Once | INTRAVENOUS | Status: DC

## 2022-11-18 MED ORDER — SODIUM CHLORIDE 0.9 % IV SOLN
2.0000 g | Freq: Once | INTRAVENOUS | Status: AC
  Administered 2022-11-18: 2 g via INTRAVENOUS
  Filled 2022-11-18: qty 12.5

## 2022-11-18 MED ORDER — LACTATED RINGERS IV SOLN: INTRAVENOUS | Status: AC

## 2022-11-18 MED ORDER — LACTATED RINGERS IV BOLUS
1000.0000 mL | Freq: Once | INTRAVENOUS | Status: AC
  Administered 2022-11-19: 1000 mL via INTRAVENOUS

## 2022-11-18 MED ORDER — LACTATED RINGERS IV BOLUS (SEPSIS)
1000.0000 mL | Freq: Once | INTRAVENOUS | Status: AC
  Administered 2022-11-18: 1000 mL via INTRAVENOUS

## 2022-11-18 NOTE — ED Provider Notes (Signed)
Pt signed out by Dr. Armandina Gemma pending labs with bun 48 and cr 4.12.  This is new.  Pt had a urine cath done and it was frank pus.  UA still pending.  CT scans pending.   I attempted to admit pt to Dr. Posey Pronto.  However, he requests that we get the CT scans first.  Pt signed out to Dr. Roxanne Mins at shift change.        Isla Pence, MD 11/05/2022 2315

## 2022-11-18 NOTE — Progress Notes (Signed)
Pharmacy Antibiotic Note  Samantha [name] is a 67 y.o. female for which pharmacy has been consulted for cefepime and vancomycin dosing for sepsis. Patient presenting with AMS.  SCr 4.12 - (0.74 in October 23) WBC 25.2; LA 5.3; T 98.2; HR 115; RR 13 COVID neg / flu neg  Plan: Metronidazole per MD Cefepime 2g q24hr Vancomycin 1000 mg once, subsequent dosing as indicated per random vancomycin level until renal function stable and/or improved, at which time scheduled dosing can be considered Trend WBC, Fever, Renal function F/u cultures, clinical course, WBC De-escalate when able     Temp (24hrs), Avg:98.2 F (36.8 C), Min:98.2 F (36.8 C), Max:98.2 F (36.8 C)  No results for input(s): "WBC", "CREATININE", "LATICACIDVEN", "VANCOTROUGH", "VANCOPEAK", "VANCORANDOM", "GENTTROUGH", "GENTPEAK", "GENTRANDOM", "TOBRATROUGH", "TOBRAPEAK", "TOBRARND", "AMIKACINPEAK", "AMIKACINTROU", "AMIKACIN" in the last 168 hours.  CrCl cannot be calculated (Patient's most recent lab result is older than the maximum 21 days allowed.).    Allergies  Allergen Reactions   Penicillins Rash    Antimicrobials this admission: Cefepime 2022-11-26 >>  flagyl Nov 26, 2022 >>  vancomycin 26-Nov-2022 >>   Microbiology results: Pending  Thank you for allowing pharmacy to be a part of this patient's care.  Lorelei Pont, PharmD, BCPS 11-26-22 7:44 PM ED Clinical Pharmacist -  [phone]

## 2022-11-18 NOTE — ED Triage Notes (Signed)
Pt BIB [facility] from home. Yesterday pt became nonverbal, seen by home health nurse today who told family pt was dehydrated. Has baseline R sided weakness from stroke. A/ox4 at baseline. Given 359ml LR with EMS. EMS VS- HR 114 ST, 122/75, 98% RA, RR 34, CBG 207, Co2 24

## 2022-11-18 NOTE — ED Provider Notes (Signed)
Care assumed from Dr. Gilford Raid, patient with urosepsis, pending CT of abdomen and pelvis.  She will need to be admitted.  CT scan shows no evidence of small bowel obstruction, but there is noted subcutaneous gas seen in the left perineal soft tissues and in the posterior aspect of the left labia majora and within the perianal subcutaneous fat without discrete loculated fluid collection.  This extends to the left levator ani musculature but does not extend into the deep pelvis.  I have independently viewed the images, and agree with radiologist's interpretation.  I have gone back to the patient to evaluate this area, and I see no external signs on the skin of the perineum or labia and can detect no crepitus.  She does have 2 small grade 1 sacral decubiti.  The antibiotics which had been ordered would be appropriate for a soft tissue infection.  I discussed the case with Dr. Posey Pronto who stated that he would not admit the patient until general surgery decided whether or not this would be a surgically managed infection.  I discussed case with Dr. Barry Dienes who states she will review the CT scan and follow along with medicine but recommends that patient be admitted to the hospitalist service.  By this time, Dr. Posey Pronto had left, I discussed the case with Dr. Marlowe Sax.  In the meantime, lactic acid increased to greater than 9 and I have ordered additional IV fluids.  She has remained hemodynamically stable.  Dr. Marlowe Sax stated that she did feel the patient was appropriate for stepdown unit and requested intensivist to evaluate the patient for admission to ICU, would admit to stepdown unit if PCCM consult stated that it would be appropriate.  I discussed case with Dr. Rudi Rummage of pulmonary critical care service who has agreed to come and evaluate the patient.  CRITICAL CARE Performed by: Delora Fuel Total critical care time: 140 minutes Critical care time was exclusive of separately billable procedures and treating other  patients. Critical care was necessary to treat or prevent imminent or life-threatening deterioration. Critical care was time spent personally by me on the following activities: development of treatment plan with patient and/or surrogate as well as nursing, discussions with consultants, evaluation of patient's response to treatment, examination of patient, obtaining history from patient or surrogate, ordering and performing treatments and interventions, ordering and review of laboratory studies, ordering and review of radiographic studies, pulse oximetry and re-evaluation of patient's condition.  Results for orders placed or performed during the hospital encounter of 13-Dec-2022  Resp panel by RT-PCR (RSV, Flu A&B, Covid) Anterior Nasal Swab   Specimen: Anterior Nasal Swab  Result Value Ref Range   SARS Coronavirus 2 by RT PCR NEGATIVE NEGATIVE   Influenza A by PCR NEGATIVE NEGATIVE   Influenza B by PCR NEGATIVE NEGATIVE   Resp Syncytial Virus by PCR NEGATIVE NEGATIVE  Blood Culture (routine x 2)   Specimen: BLOOD LEFT FOREARM  Result Value Ref Range   Specimen Description BLOOD LEFT FOREARM    Special Requests      BOTTLES DRAWN AEROBIC AND ANAEROBIC Blood Culture results may not be optimal due to an inadequate volume of blood received in culture bottles Performed at [facility] [address] [place], [place] [address]    Culture PENDING    Report Status PENDING   Lactic acid, plasma  Result Value Ref Range   Lactic Acid, Venous 5.3 (HH) 0.5 - 1.9 mmol/L  Lactic acid, plasma  Result Value Ref Range  Lactic Acid, Venous 6.3 (HH) 0.5 - 1.9 mmol/L  CBC with Differential  Result Value Ref Range   WBC 25.2 (H) 4.0 - 10.5 K/uL   RBC 3.20 (L) 3.87 - 5.11 MIL/uL   Hemoglobin 11.5 (L) 12.0 - 15.0 g/dL   HCT 35.2 (L) 36.0 - 46.0 %   MCV 110.0 (H) 80.0 - 100.0 fL   MCH 35.9 (H) 26.0 - 34.0 pg   MCHC 32.7 30.0 - 36.0 g/dL   RDW 16.9 (H) 11.5 - 15.5 %   Platelets 94 (L) 150 - 400  K/uL   nRBC 0.1 0.0 - 0.2 %   Neutrophils Relative % 93 %   Neutro Abs 23.5 (H) 1.7 - 7.7 K/uL   Lymphocytes Relative 3 %   Lymphs Abs 0.7 0.7 - 4.0 K/uL   Monocytes Relative 2 %   Monocytes Absolute 0.5 0.1 - 1.0 K/uL   Eosinophils Relative 0 %   Eosinophils Absolute 0.0 0.0 - 0.5 K/uL   Basophils Relative 0 %   Basophils Absolute 0.1 0.0 - 0.1 K/uL   Immature Granulocytes 2 %   Abs Immature Granulocytes 0.38 (H) 0.00 - 0.07 K/uL  Protime-INR  Result Value Ref Range   Prothrombin Time 18.0 (H) 11.4 - 15.2 seconds   INR 1.5 (H) 0.8 - 1.2  APTT  Result Value Ref Range   aPTT 30 24 - 36 seconds  Urinalysis, Routine w reflex microscopic Urine, In & Out Cath  Result Value Ref Range   Color, Urine YELLOW YELLOW   APPearance TURBID (A) CLEAR   Specific Gravity, Urine 1.025 1.005 - 1.030   pH 5.5 5.0 - 8.0   Glucose, UA NEGATIVE NEGATIVE mg/dL   Hgb urine dipstick LARGE (A) NEGATIVE   Bilirubin Urine MODERATE (A) NEGATIVE   Ketones, ur NEGATIVE NEGATIVE mg/dL   Protein, ur 100 (A) NEGATIVE mg/dL   Nitrite NEGATIVE NEGATIVE   Leukocytes,Ua MODERATE (A) NEGATIVE  Comprehensive metabolic panel  Result Value Ref Range   Sodium 138 135 - 145 mmol/L   Potassium 5.4 (H) 3.5 - 5.1 mmol/L   Chloride 102 98 - 111 mmol/L   CO2 21 (L) 22 - 32 mmol/L   Glucose, Bld 226 (H) 70 - 99 mg/dL   BUN 48 (H) 8 - 23 mg/dL   Creatinine, Ser 4.12 (H) 0.44 - 1.00 mg/dL   Calcium 8.3 (L) 8.9 - 10.3 mg/dL   Total Protein 5.7 (L) 6.5 - 8.1 g/dL   Albumin <1.5 (L) 3.5 - 5.0 g/dL   AST 63 (H) 15 - 41 U/L   ALT 43 0 - 44 U/L   Alkaline Phosphatase 170 (H) 38 - 126 U/L   Total Bilirubin 1.9 (H) 0.3 - 1.2 mg/dL   GFR, Estimated 11 (L) >60 mL/min   Anion gap 15 5 - 15  Lactic acid, plasma  Result Value Ref Range   Lactic Acid, Venous >9.0 (HH) 0.5 - 1.9 mmol/L  Urinalysis, Microscopic (reflex)  Result Value Ref Range   RBC / HPF 11-20 0 - 5 RBC/hpf   WBC, UA >50 0 - 5 WBC/hpf   Bacteria, UA MANY (A)  NONE SEEN   Squamous Epithelial / HPF 6-10 0 - 5 /HPF  CBG monitoring, ED  Result Value Ref Range   Glucose-Capillary 172 (H) 70 - 99 mg/dL  Troponin I (High Sensitivity)  Result Value Ref Range   Troponin I (High Sensitivity) 213 (HH) <18 ng/L  Troponin I (High Sensitivity)  Result Value  Ref Range   Troponin I (High Sensitivity) 33 (H) <18 ng/L   CT CHEST ABDOMEN PELVIS WO CONTRAST  Result Date: 11/19/2022 CLINICAL DATA:  Sepsis EXAM: CT CHEST, ABDOMEN AND PELVIS WITHOUT CONTRAST TECHNIQUE: Multidetector CT imaging of the chest, abdomen and pelvis was performed following the standard protocol without IV contrast. RADIATION DOSE REDUCTION: This exam was performed according to the departmental dose-optimization program which includes automated exposure control, adjustment of the mA and/or kV according to patient size and/or use of iterative reconstruction technique. COMPARISON:  None Available. FINDINGS: CT CHEST FINDINGS Cardiovascular: Moderate multi-vessel coronary artery calcification. Global cardiac size within normal limits. No pericardial effusion. Central pulmonary arteries are enlarged in keeping with changes of pulmonary arterial hypertension. Mild atherosclerotic calcification within the thoracic aorta. No aortic aneurysm. Mediastinum/Nodes: No enlarged mediastinal, hilar, or axillary lymph nodes. Thyroid gland, trachea, and esophagus demonstrate no significant findings. Lungs/Pleura: Lungs are clear. No pleural effusion or pneumothorax. Musculoskeletal: No acute bone abnormality. No lytic or blastic bone lesion. CT ABDOMEN PELVIS FINDINGS Hepatobiliary: No focal liver abnormality is seen. No gallstones, gallbladder wall thickening, or biliary dilatation. Pancreas: Unremarkable. No pancreatic ductal dilatation or surrounding inflammatory changes. Spleen: Unremarkable Adrenals/Urinary Tract: The adrenal glands are unremarkable. The kidneys are normal in position. Marked left renal cortical  scarring and atrophy. The right kidney is normal in size. Nonobstructing calculi are noted within the lower pole of the kidneys bilaterally measuring up to 7 mm on the left and 4 mm on the right. Additional vascular calcifications noted within the renal hila. No hydronephrosis. No ureteral calculi. The bladder is unremarkable. Stomach/Bowel: The stomach, small bowel, and large bowel are unremarkable. Appendix normal. No free intraperitoneal gas or fluid. Vascular/Lymphatic: Mild aortoiliac atherosclerotic calcification. No aortic aneurysm. No pathologic adenopathy within the abdomen and pelvis. Reproductive: Uterus and bilateral adnexa are unremarkable. Other: There is extensive subcutaneous gas seen within the left peroneal soft tissues along the posterior aspect of the left labial majora and within the perianal subcutaneous fat. There is superimposed debris within this region best appreciated on image # 125/3 without discrete loculated fluid collection identified. The subcutaneous gas, suggestive of an aggressive soft tissue infection, extends to the left levator ani musculature, but does not appear to extend into the deep pelvis. Additionally there is extension of subcutaneous gas and inflammatory changes superficial to the sacrum to the level of the iliac spines bilaterally. There is asymmetric extensive subcutaneous edema within the a dependent subcutaneous fat of the proximal right thigh which may relate to dependent positioning or a localized inflammatory process. No loculated fluid collection or subcutaneous gas noted in this region. Musculoskeletal: No acute bone abnormality. No lytic or blastic bone lesion. IMPRESSION: 1. Extensive subcutaneous gas and debris within the left perineal soft tissues along the posterior aspect of the left labia majora and within the perianal subcutaneous in keeping with an aggressive soft tissue infection. Inflammatory change extends to the left levator ani musculature, but  does not appear to extend into the deep pelvis, and extends superficial to the sacrum to the level of the iliac spines. No loculated subcutaneous fluid collection identified. 2. Asymmetric extensive subcutaneous edema within the dependent subcutaneous fat of the proximal right thigh which may relate to dependent positioning or a localized inflammatory process. No loculated fluid collection or subcutaneous gas noted in this region. 3. Moderate multi-vessel coronary artery calcification. 4. Morphologic changes in keeping with pulmonary arterial hypertension. 5. Marked left renal cortical scarring and atrophy. Mild bilateral nonobstructing nephrolithiasis.  6.  Aortic Atherosclerosis (ICD10-I70.0). Electronically Signed   By: Helyn Numbers M.D.   On: 11/19/2022 00:29   CT HEAD WO CONTRAST ( )  Result Date: 11/19/2022 CLINICAL DATA:  Altered mental status EXAM: CT HEAD WITHOUT CONTRAST TECHNIQUE: Contiguous axial images were obtained from the base of the skull through the vertex without intravenous contrast. RADIATION DOSE REDUCTION: This exam was performed according to the departmental dose-optimization program which includes automated exposure control, adjustment of the mA and/or kV according to patient size and/or use of iterative reconstruction technique. COMPARISON:  None Available. FINDINGS: Brain: There is no mass, hemorrhage or extra-axial collection. There is generalized atrophy without lobar predilection. Hypodensity of the white matter is most commonly associated with chronic microvascular disease. 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 Vascular: No abnormal hyperdensity of the major intracranial arteries or dural venous sinuses. No intracranial atherosclerosis. Skull: The visualized skull base, calvarium and extracranial soft tissues are normal. Sinuses/Orbits: No fluid levels or advanced mucosal thickening of the visualized paranasal sinuses. No mastoid or middle  ear effusion. The orbits are normal. IMPRESSION: 1. No acute intracranial abnormality. 2. 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 3. Chronic microvascular ischemia. Electronically Signed   By: Deatra Robinson M.D.   On: 11/19/2022 00:13   DG Chest Port 1 View  Result Date: 11/13/2022 CLINICAL DATA:  Sepsis, became nonverbal, dehydrated EXAM: PORTABLE CHEST 1 VIEW COMPARISON:  Portable exam 1951 hours compared to 04/13/2022 FINDINGS: Normal heart size, mediastinal contours, and pulmonary vascularity. Atherosclerotic calcification aorta. Lungs clear. No infiltrate, pleural effusion, or pneumothorax. No acute osseous findings. IMPRESSION: No acute abnormalities. Aortic Atherosclerosis (ICD10-I70.0). Electronically Signed   By: Ulyses Southward M.D.   On: 11/16/2022 20:15      Dione Booze, MD 11/19/22 [phone]

## 2022-11-18 NOTE — ED Notes (Signed)
CBG 172

## 2022-11-18 NOTE — Progress Notes (Addendum)
Pt being followed by [facility] for Sepsis protocol. 

## 2022-11-18 NOTE — ED Provider Notes (Addendum)
[place] EMERGENCY DEPARTMENT AT [facility] Provider Note   CSN: [id number] Arrival date & time: 11/17/2022  1910     History  Chief Complaint  Patient presents with   Altered Mental Status    Samantha [name] is a 67 y.o. female.   Altered Mental Status    67 year old female with past medical history significant for HTN, diabetes, CVA with residual right hemibody deficits, morbid obesity, HLD, presenting to the emergency department with altered mental status.  Home health care head evaluated the patient and thought that she was possibly dehydrated.  The main history was provided by the patient's husband as the patient was unable to provide a history of present illness due to her being nonverbal.  The patient's husband states that 2 days ago she became acutely confused.  She is normally communicative.  Her baseline is normally alert and oriented x 4, able to hold a conversation.  Over the past 2 days she has become progressively more altered and confused.  The patient's husband also states that she has had episodes of nausea and vomiting over the past few days as well, no blood in her emesis.  Unclear if she is passing gas.  Home Medications Prior to Admission medications   Medication Sig Start Date End Date Taking? Authorizing Provider  Accu-Chek Softclix Lancets lancets Use to test blood sugars 4 times daily. 02/13/22   Angiulli, Mcarthur Rossetti, PA-C  acetaminophen (TYLENOL) 325 MG tablet Take 2 tablets (650 mg total) by mouth every 4 (four) hours as needed for mild pain (or temp > 37.5 C (99.5 F)). 02/12/22   Angiulli, Mcarthur Rossetti, PA-C  amLODipine (NORVASC) 5 MG tablet Take 1 tablet (5 mg total) by mouth daily. 10/15/22   [name], PA-C  aspirin (ASPIRIN 81) 81 MG chewable tablet Chew 1 tablet (81 mg total) by mouth daily. 08/16/22   [name], PA-C  atorvastatin (LIPITOR) 80 MG tablet Take 1 tablet (80 mg total) by mouth daily at 6 PM. 08/13/22   Drubel, Lillia Abed, PA-C   Baclofen 5 MG TABS Take 5 mg by mouth at bedtime. 08/13/22   [name], PA-C  Blood Pressure Monitoring DEVI Check bp in AM and PM 03/01/22   Drubel, Lillia Abed, PA-C  cyanocobalamin 1000 MCG tablet Take 1,000 mcg by mouth daily. Gummy    [provider]  dextromethorphan 15 MG/5ML syrup Take 10 mLs (30 mg total) by mouth 4 (four) times daily as needed for cough. 10/26/22   Simmons-Robinson, Makiera, MD  escitalopram (LEXAPRO) 10 MG tablet Take 1 tablet (10 mg total) by mouth daily. 08/13/22   [name], PA-C  glucose blood (ACCU-CHEK GUIDE) test strip Use to test blood sugars 4 times daily. 02/13/22   Angiulli, Mcarthur Rossetti, PA-C  Insulin Glargine (BASAGLAR KWIKPEN) 100 UNIT/ML Inject 16 Units into the skin daily. 08/13/22   [name], PA-C  Insulin Pen Needle 32G X 4 MM MISC Use to inject insulin up to 4 times daily. 08/13/22   [name], PA-C  loperamide (IMODIUM A-D) 2 MG tablet Take by mouth.    [provider]  metoprolol tartrate (LOPRESSOR) 50 MG tablet Take 1 tablet (50 mg total) by mouth 2 (two) times daily. 08/13/22   [name], PA-C  nystatin cream (MYCOSTATIN) Apply 1 Application topically 2 (two) times daily. Use on area of concern. Can use with steroid cream 10/15/22   Drubel, Lillia Abed, PA-C  ondansetron (ZOFRAN-ODT) 4 MG disintegrating tablet Take 1 tablet (4 mg total)  by mouth every 8 (eight) hours as needed for nausea or vomiting. 11/12/22   [name], PA-C  triamcinolone cream (KENALOG) 0.1 % Apply 1 Application topically 2 (two) times daily. Use on area of concern. Can use with nystatin. Do not use for more than 10 days in a row 10/15/22   Drubel, Lillia Abed, PA-C      Allergies    Penicillins    Review of Systems   Review of Systems  Unable to perform ROS: Mental status change    Physical Exam Updated Vital Signs BP 114/72   Pulse (!) 116   Temp 98.2 F (36.8 C) (Rectal)   Resp (!) 29   SpO2 100%  Physical Exam Vitals and  nursing note reviewed.  Constitutional:      Appearance: She is obese. She is ill-appearing.     Comments: GCS 12 (4-2-6)  HENT:     Head: Normocephalic and atraumatic.  Eyes:     Conjunctiva/sclera: Conjunctivae normal.     Pupils: Pupils are equal, round, and reactive to light.  Cardiovascular:     Rate and Rhythm: Regular rhythm. Tachycardia present.     Pulses: Normal pulses.  Pulmonary:     Effort: Pulmonary effort is normal. No respiratory distress.  Abdominal:     General: There is no distension.     Tenderness: There is abdominal tenderness. There is no guarding.  Musculoskeletal:        General: No deformity or signs of injury.     Cervical back: Neck supple.     Right lower leg: No edema.     Left lower leg: No edema.     Comments: Multiple stage II pressure ulcers present on the patient's right gluteus, skin margins well-appearing, no surrounding erythema  Skin:    Findings: No lesion or rash.  Neurological:     Mental Status: She is alert.     Comments: Nonverbal, GCS 12, Right hemibody deficits at baseline per husband     ED Results / Procedures / Treatments   Labs (all labs ordered are listed, but only abnormal results are displayed) Labs Reviewed  LACTIC ACID, PLASMA - Abnormal; Notable for the following components:      Result Value   Lactic Acid, Venous 5.3 (*)    All other components within normal limits  LACTIC ACID, PLASMA - Abnormal; Notable for the following components:   Lactic Acid, Venous 6.3 (*)    All other components within normal limits  CBC WITH DIFFERENTIAL/PLATELET - Abnormal; Notable for the following components:   WBC 25.2 (*)    RBC 3.20 (*)    Hemoglobin 11.5 (*)    HCT 35.2 (*)    MCV 110.0 (*)    MCH 35.9 (*)    RDW 16.9 (*)    Platelets 94 (*)    Neutro Abs 23.5 (*)    Abs Immature Granulocytes 0.38 (*)    All other components within normal limits  PROTIME-INR - Abnormal; Notable for the following components:   Prothrombin  Time 18.0 (*)    INR 1.5 (*)    All other components within normal limits  CBG MONITORING, ED - Abnormal; Notable for the following components:   Glucose-Capillary 172 (*)    All other components within normal limits  TROPONIN I (HIGH SENSITIVITY) - Abnormal; Notable for the following components:   Troponin I (High Sensitivity) 213 (*)    All other components within normal limits  RESP PANEL BY RT-PCR (RSV,  FLU A&B, COVID)  RVPGX2  CULTURE, BLOOD (ROUTINE X 2)  CULTURE, BLOOD (ROUTINE X 2)  URINE CULTURE  APTT  URINALYSIS, ROUTINE W REFLEX MICROSCOPIC  COMPREHENSIVE METABOLIC PANEL  LACTIC ACID, PLASMA  LACTIC ACID, PLASMA  TROPONIN I (HIGH SENSITIVITY)    EKG EKG Interpretation  Date/Time:  Sunday November 18 2022 19:18:31 EST Ventricular Rate:  115 PR Interval:  104 QRS Duration: 86 QT Interval:  313 QTC Calculation: 433 R Axis:   62 Text Interpretation: Sinus tachycardia Abnormal T, consider ischemia, diffuse leads Confirmed by Regan Lemming ([id number]) on 11/06/2022 7:40:07 PM  Radiology DG Chest Port 1 View  Result Date: 11/15/2022 CLINICAL DATA:  Sepsis, became nonverbal, dehydrated EXAM: PORTABLE CHEST 1 VIEW COMPARISON:  Portable exam 1951 hours compared to 04/13/2022 FINDINGS: Normal heart size, mediastinal contours, and pulmonary vascularity. Atherosclerotic calcification aorta. Lungs clear. No infiltrate, pleural effusion, or pneumothorax. No acute osseous findings. IMPRESSION: No acute abnormalities. Aortic Atherosclerosis (ICD10-I70.0). Electronically Signed   By: Lavonia Dana M.D.   On: 11/21/2022 20:15    Procedures .Critical Care  Performed by: Regan Lemming, MD Authorized by: Regan Lemming, MD   Critical care provider statement:    Critical care time (minutes):  75   Critical care was necessary to treat or prevent imminent or life-threatening deterioration of the following conditions:  Sepsis   Critical care was time spent personally by me on the following  activities:  Development of treatment plan with patient or surrogate, discussions with consultants, evaluation of patient's response to treatment, examination of patient, ordering and review of laboratory studies, ordering and review of radiographic studies, ordering and performing treatments and interventions, pulse oximetry, re-evaluation of patient's condition and review of old charts   Care discussed with: admitting provider   Ultrasound ED Peripheral IV (Provider)  Date/Time: 11/10/2022 9:08 PM  Performed by: Regan Lemming, MD Authorized by: Regan Lemming, MD   Procedure details:    Indications: poor IV access     Location:  Left AC   Angiocath:  18 G   Bedside Ultrasound Guided: Yes     Images: not archived     Patient tolerated procedure without complications: Yes     Dressing applied: Yes       Medications Ordered in ED Medications  lactated ringers infusion ( Intravenous New Bag/Given 11/15/2022 1946)  lactated ringers bolus 1,000 mL (0 mLs Intravenous Stopped 11/21/2022 2139)  metroNIDAZOLE (FLAGYL) IVPB 500 mg (0 mg Intravenous Stopped 11/09/2022 2115)  vancomycin (VANCOCIN) IVPB 1000 mg/200 mL premix (0 mg Intravenous Stopped 11/16/2022 2116)  ondansetron (ZOFRAN) injection 4 mg (4 mg Intravenous Given 11/28/2022 2002)  ceFEPIme (MAXIPIME) 2 g in sodium chloride 0.9 % 100 mL IVPB (0 g Intravenous Stopped 11/20/2022 2047)    ED Course/ Medical Decision Making/ A&P Clinical Course as of 11/12/2022 2208  Sun Nov 18, 2022  2115 Troponin I (High Sensitivity)(!!): 213 [JL]  2115 Lactic Acid, Venous(!!): 5.3 [JL]  2207 Lactic Acid, Venous(!!): 6.3 [JL]  2207 WBC(!): 25.2 [JL]  2207 Glucose-Capillary(!): 172 [JL]    Clinical Course User Index [JL] Regan Lemming, MD                             Medical Decision Making Amount and/or Complexity of Data Reviewed Labs: ordered. Decision-making details documented in ED Course. Radiology: ordered. ECG/medicine tests:  ordered.  Risk Prescription drug management.    67 year old female with past medical history significant  for HTN, diabetes, CVA with residual right hemibody deficits, morbid obesity, HLD, presenting to the emergency department with altered mental status.  Home health care head evaluated the patient and thought that she was possibly dehydrated.  The main history was provided by the patient's husband as the patient was unable to provide a history of present illness due to her being nonverbal.  The patient's husband states that 2 days ago she became acutely confused.  She is normally communicative.  Her baseline is normally alert and oriented x 4, able to hold a conversation.  Over the past 2 days she has become progressively more altered and confused.  The patient's husband also states that she has had episodes of nausea and vomiting over the past few days as well, no blood in her emesis.  Unclear if she is passing gas.  On arrival, patient met SIRS criteria, was tachycardic with heart rates in the 115's, sinus tachycardia noted on cardiac telemetry, afebrile rectal temperature but tachypneic.  She had borderline soft blood pressures with a blood pressure of 96/68.  My immediate concern was for life-threatening infection and possible sepsis.  Code sepsis initiated with potential source being intra-abdominal, respiratory or genitourinary.  Distantly have concern for possible small bowel obstruction given the patient's nausea and vomiting and change in mental status.  Patient moving her neck without clear rigidity, low concern for meningitis.  Considered pulmonary embolism and ACS.  Considered recurrent CVA or stroke recrudescence.  IV access was obtained and the patient was administered an additional IV fluid bolus, broad-spectrum antibiotics to include IV vancomycin, IV Flagyl, IV cefepime.  She was administered Zofran for nausea and vomiting. Considered mesenteric ischemia.  Labs: Initial lactic acid  elevated at 5.3, repeat uptrending to 6.3, CBC with leukocytosis to 25.2, hemoglobin of 11.5, initial troponin elevated to 213, COVID-19, influenza, RSV PCR testing was collected and resulted negative, urine culture and urinalysis obtained by catheterization, blood cultures x 2 collected prior to antibiotic administration.  Initial CBG 172.  CMP pending.  Imaging: Pending  The ECG revealed: Sinus tachycardia, abnormal T waves diffusely, ventricular rate 115, no STEMI.   Further evaluation needed to include CT imaging of the head, CTA of the chest to evaluate for PE and pneumonia, CT of the abdomen pelvis to evaluate for intra-abdominal abnormality.  CT imaging pending at time of signout, repeat troponin, further measurements of lactic acid pending.  On repeat evaluation, the patient's blood pressure had improved following initial fluid resuscitation.  My concern is for severe sepsis, source suspected to be genitourinary versus intra-abdominal.  Plan to admit the patient to a stepdown status.  I did have a discussion with the patient's husband regarding the patient's CODE STATUS and she remains FULL CODE at this time.  Given the patient's mental status change, considered acute encephalopathy in the setting of the patient's acute illness.  On repeat assessment, she remained a GCS of 10.  Signout given to Dr. Gilford Raid at 2200 pending CT imaging likely admission to stepdown status to the hospitalist service.  If the patient develops shock requiring pressors or emergent need for intubation escalation to the ICU would be required.  Final Clinical Impression(s) / ED Diagnoses Final diagnoses:  Altered mental status, unspecified altered mental status type  Severe sepsis [facility])    Rx / DC Orders ED Discharge Orders     None           Regan Lemming, MD 11/26/2022 2208    Regan Lemming, MD  11/08/2022 2208  

## 2022-11-19 ENCOUNTER — Encounter (HOSPITAL_COMMUNITY): Payer: Self-pay | Admitting: Pulmonary Disease

## 2022-11-19 ENCOUNTER — Inpatient Hospital Stay (HOSPITAL_COMMUNITY): Payer: Medicare HMO

## 2022-11-19 DIAGNOSIS — R4701 Aphasia: Secondary | ICD-10-CM | POA: Diagnosis not present

## 2022-11-19 DIAGNOSIS — N170 Acute kidney failure with tubular necrosis: Secondary | ICD-10-CM | POA: Diagnosis not present

## 2022-11-19 DIAGNOSIS — E43 Unspecified severe protein-calorie malnutrition: Secondary | ICD-10-CM | POA: Diagnosis not present

## 2022-11-19 DIAGNOSIS — D631 Anemia in chronic kidney disease: Secondary | ICD-10-CM | POA: Diagnosis not present

## 2022-11-19 DIAGNOSIS — Z4682 Encounter for fitting and adjustment of non-vascular catheter: Secondary | ICD-10-CM | POA: Diagnosis not present

## 2022-11-19 DIAGNOSIS — M7989 Other specified soft tissue disorders: Secondary | ICD-10-CM | POA: Diagnosis not present

## 2022-11-19 DIAGNOSIS — I480 Paroxysmal atrial fibrillation: Secondary | ICD-10-CM | POA: Diagnosis not present

## 2022-11-19 DIAGNOSIS — I129 Hypertensive chronic kidney disease with stage 1 through stage 4 chronic kidney disease, or unspecified chronic kidney disease: Secondary | ICD-10-CM | POA: Diagnosis not present

## 2022-11-19 DIAGNOSIS — D62 Acute posthemorrhagic anemia: Secondary | ICD-10-CM | POA: Diagnosis not present

## 2022-11-19 DIAGNOSIS — R4182 Altered mental status, unspecified: Secondary | ICD-10-CM

## 2022-11-19 DIAGNOSIS — Z1152 Encounter for screening for COVID-19: Secondary | ICD-10-CM | POA: Diagnosis not present

## 2022-11-19 DIAGNOSIS — G319 Degenerative disease of nervous system, unspecified: Secondary | ICD-10-CM | POA: Diagnosis not present

## 2022-11-19 DIAGNOSIS — I484 Atypical atrial flutter: Secondary | ICD-10-CM | POA: Diagnosis not present

## 2022-11-19 DIAGNOSIS — I6930 Unspecified sequelae of cerebral infarction: Secondary | ICD-10-CM | POA: Diagnosis not present

## 2022-11-19 DIAGNOSIS — N1832 Chronic kidney disease, stage 3b: Secondary | ICD-10-CM | POA: Diagnosis not present

## 2022-11-19 DIAGNOSIS — R6520 Severe sepsis without septic shock: Secondary | ICD-10-CM

## 2022-11-19 DIAGNOSIS — I7789 Other specified disorders of arteries and arterioles: Secondary | ICD-10-CM | POA: Diagnosis not present

## 2022-11-19 DIAGNOSIS — E872 Acidosis, unspecified: Secondary | ICD-10-CM | POA: Diagnosis not present

## 2022-11-19 DIAGNOSIS — E119 Type 2 diabetes mellitus without complications: Secondary | ICD-10-CM | POA: Diagnosis not present

## 2022-11-19 DIAGNOSIS — L02215 Cutaneous abscess of perineum: Secondary | ICD-10-CM | POA: Diagnosis not present

## 2022-11-19 DIAGNOSIS — R0602 Shortness of breath: Secondary | ICD-10-CM | POA: Diagnosis not present

## 2022-11-19 DIAGNOSIS — I2510 Atherosclerotic heart disease of native coronary artery without angina pectoris: Secondary | ICD-10-CM | POA: Diagnosis not present

## 2022-11-19 DIAGNOSIS — D6959 Other secondary thrombocytopenia: Secondary | ICD-10-CM | POA: Diagnosis not present

## 2022-11-19 DIAGNOSIS — G9341 Metabolic encephalopathy: Secondary | ICD-10-CM | POA: Diagnosis not present

## 2022-11-19 DIAGNOSIS — R6521 Severe sepsis with septic shock: Secondary | ICD-10-CM | POA: Diagnosis present

## 2022-11-19 DIAGNOSIS — R609 Edema, unspecified: Secondary | ICD-10-CM | POA: Diagnosis not present

## 2022-11-19 DIAGNOSIS — R627 Adult failure to thrive: Secondary | ICD-10-CM | POA: Diagnosis not present

## 2022-11-19 DIAGNOSIS — N2889 Other specified disorders of kidney and ureter: Secondary | ICD-10-CM | POA: Diagnosis not present

## 2022-11-19 DIAGNOSIS — Z6841 Body Mass Index (BMI) 40.0 and over, adult: Secondary | ICD-10-CM | POA: Diagnosis not present

## 2022-11-19 DIAGNOSIS — N200 Calculus of kidney: Secondary | ICD-10-CM | POA: Diagnosis not present

## 2022-11-19 DIAGNOSIS — N3000 Acute cystitis without hematuria: Secondary | ICD-10-CM | POA: Diagnosis not present

## 2022-11-19 DIAGNOSIS — A419 Sepsis, unspecified organism: Secondary | ICD-10-CM | POA: Diagnosis not present

## 2022-11-19 DIAGNOSIS — N390 Urinary tract infection, site not specified: Secondary | ICD-10-CM | POA: Diagnosis not present

## 2022-11-19 DIAGNOSIS — I69351 Hemiplegia and hemiparesis following cerebral infarction affecting right dominant side: Secondary | ICD-10-CM | POA: Diagnosis not present

## 2022-11-19 DIAGNOSIS — I10 Essential (primary) hypertension: Secondary | ICD-10-CM | POA: Diagnosis not present

## 2022-11-19 DIAGNOSIS — K6819 Other retroperitoneal abscess: Secondary | ICD-10-CM | POA: Diagnosis not present

## 2022-11-19 DIAGNOSIS — Z7189 Other specified counseling: Secondary | ICD-10-CM | POA: Diagnosis not present

## 2022-11-19 DIAGNOSIS — I700 Atherosclerosis of aorta: Secondary | ICD-10-CM | POA: Diagnosis not present

## 2022-11-19 DIAGNOSIS — N261 Atrophy of kidney (terminal): Secondary | ICD-10-CM | POA: Diagnosis not present

## 2022-11-19 DIAGNOSIS — R0681 Apnea, not elsewhere classified: Secondary | ICD-10-CM | POA: Diagnosis not present

## 2022-11-19 DIAGNOSIS — N179 Acute kidney failure, unspecified: Secondary | ICD-10-CM

## 2022-11-19 DIAGNOSIS — Z794 Long term (current) use of insulin: Secondary | ICD-10-CM | POA: Diagnosis not present

## 2022-11-19 DIAGNOSIS — Z66 Do not resuscitate: Secondary | ICD-10-CM | POA: Diagnosis not present

## 2022-11-19 DIAGNOSIS — R739 Hyperglycemia, unspecified: Secondary | ICD-10-CM | POA: Diagnosis not present

## 2022-11-19 DIAGNOSIS — A4151 Sepsis due to Escherichia coli [E. coli]: Secondary | ICD-10-CM | POA: Diagnosis not present

## 2022-11-19 DIAGNOSIS — N764 Abscess of vulva: Secondary | ICD-10-CM | POA: Diagnosis not present

## 2022-11-19 DIAGNOSIS — E1122 Type 2 diabetes mellitus with diabetic chronic kidney disease: Secondary | ICD-10-CM | POA: Diagnosis not present

## 2022-11-19 DIAGNOSIS — D696 Thrombocytopenia, unspecified: Secondary | ICD-10-CM | POA: Diagnosis not present

## 2022-11-19 DIAGNOSIS — I82411 Acute embolism and thrombosis of right femoral vein: Secondary | ICD-10-CM | POA: Diagnosis not present

## 2022-11-19 DIAGNOSIS — F32A Depression, unspecified: Secondary | ICD-10-CM | POA: Diagnosis not present

## 2022-11-19 DIAGNOSIS — Z515 Encounter for palliative care: Secondary | ICD-10-CM | POA: Diagnosis not present

## 2022-11-19 DIAGNOSIS — G9340 Encephalopathy, unspecified: Secondary | ICD-10-CM | POA: Diagnosis not present

## 2022-11-19 DIAGNOSIS — R29810 Facial weakness: Secondary | ICD-10-CM | POA: Diagnosis not present

## 2022-11-19 LAB — BLOOD CULTURE ID PANEL (REFLEXED) - BCID2

## 2022-11-19 LAB — COMPREHENSIVE METABOLIC PANEL
ALT: 40 U/L (ref 0–44)
AST: 64 U/L — ABNORMAL HIGH (ref 15–41)
Albumin: 1.6 g/dL — ABNORMAL LOW (ref 3.5–5.0)
Alkaline Phosphatase: 192 U/L — ABNORMAL HIGH (ref 38–126)
Anion gap: 20 — ABNORMAL HIGH (ref 5–15)
BUN: 46 mg/dL — ABNORMAL HIGH (ref 8–23)
CO2: 17 mmol/L — ABNORMAL LOW (ref 22–32)
Calcium: 8.5 mg/dL — ABNORMAL LOW (ref 8.9–10.3)
Chloride: 102 mmol/L (ref 98–111)
Creatinine, Ser: 3.82 mg/dL — ABNORMAL HIGH (ref 0.44–1.00)
GFR, Estimated: 13 mL/min — ABNORMAL LOW (ref >60)
Glucose, Bld: 193 mg/dL — ABNORMAL HIGH (ref 70–99)
Potassium: 5.2 mmol/L — ABNORMAL HIGH (ref 3.5–5.1)
Sodium: 139 mmol/L (ref 135–145)
Total Bilirubin: 1.9 mg/dL — ABNORMAL HIGH (ref 0.3–1.2)
Total Protein: 6.1 g/dL — ABNORMAL LOW (ref 6.5–8.1)

## 2022-11-19 LAB — BASIC METABOLIC PANEL
Anion gap: 19 — ABNORMAL HIGH (ref 5–15)
Anion gap: 12 (ref 5–15)
Anion gap: 11 (ref 5–15)
BUN: 47 mg/dL — ABNORMAL HIGH (ref 8–23)
BUN: 43 mg/dL — ABNORMAL HIGH (ref 8–23)
BUN: 44 mg/dL — ABNORMAL HIGH (ref 8–23)
CO2: 16 mmol/L — ABNORMAL LOW (ref 22–32)
CO2: 22 mmol/L (ref 22–32)
CO2: 21 mmol/L — ABNORMAL LOW (ref 22–32)
Calcium: 8.3 mg/dL — ABNORMAL LOW (ref 8.9–10.3)
Calcium: 8.5 mg/dL — ABNORMAL LOW (ref 8.9–10.3)
Calcium: 8.3 mg/dL — ABNORMAL LOW (ref 8.9–10.3)
Chloride: 104 mmol/L (ref 98–111)
Chloride: 103 mmol/L (ref 98–111)
Chloride: 102 mmol/L (ref 98–111)
Creatinine, Ser: 3.88 mg/dL — ABNORMAL HIGH (ref 0.44–1.00)
Creatinine, Ser: 3.5 mg/dL — ABNORMAL HIGH (ref 0.44–1.00)
Creatinine, Ser: 3.4 mg/dL — ABNORMAL HIGH (ref 0.44–1.00)
GFR, Estimated: 12 mL/min — ABNORMAL LOW (ref >60)
GFR, Estimated: 14 mL/min — ABNORMAL LOW (ref >60)
GFR, Estimated: 14 mL/min — ABNORMAL LOW (ref >60)
Glucose, Bld: 155 mg/dL — ABNORMAL HIGH (ref 70–99)
Glucose, Bld: 214 mg/dL — ABNORMAL HIGH (ref 70–99)
Glucose, Bld: 231 mg/dL — ABNORMAL HIGH (ref 70–99)
Potassium: 5.8 mmol/L — ABNORMAL HIGH (ref 3.5–5.1)
Potassium: 4.5 mmol/L (ref 3.5–5.1)
Potassium: 4.5 mmol/L (ref 3.5–5.1)
Sodium: 139 mmol/L (ref 135–145)
Sodium: 137 mmol/L (ref 135–145)
Sodium: 134 mmol/L — ABNORMAL LOW (ref 135–145)

## 2022-11-19 LAB — CBC
HCT: 32.6 % — ABNORMAL LOW (ref 36.0–46.0)
Hemoglobin: 10.6 g/dL — ABNORMAL LOW (ref 12.0–15.0)
MCH: 35.3 pg — ABNORMAL HIGH (ref 26.0–34.0)
MCHC: 32.5 g/dL (ref 30.0–36.0)
MCV: 108.7 fL — ABNORMAL HIGH (ref 80.0–100.0)
Platelets: 63 10*3/uL — ABNORMAL LOW (ref 150–400)
RBC: 3 MIL/uL — ABNORMAL LOW (ref 3.87–5.11)
RDW: 16.6 % — ABNORMAL HIGH (ref 11.5–15.5)
WBC: 26.3 10*3/uL — ABNORMAL HIGH (ref 4.0–10.5)
nRBC: 0 % (ref 0.0–0.2)

## 2022-11-19 LAB — GLUCOSE, CAPILLARY
Glucose-Capillary: 187 mg/dL — ABNORMAL HIGH (ref 70–99)
Glucose-Capillary: 201 mg/dL — ABNORMAL HIGH (ref 70–99)
Glucose-Capillary: 193 mg/dL — ABNORMAL HIGH (ref 70–99)
Glucose-Capillary: 207 mg/dL — ABNORMAL HIGH (ref 70–99)

## 2022-11-19 LAB — LACTIC ACID, PLASMA
Lactic Acid, Venous: 8.1 mmol/L (ref 0.5–1.9)
Lactic Acid, Venous: >9 mmol/L (ref 0.5–1.9)
Lactic Acid, Venous: 7.5 mmol/L (ref 0.5–1.9)

## 2022-11-19 LAB — PHOSPHORUS
Phosphorus: 4.3 mg/dL (ref 2.5–4.6)
Phosphorus: 4.2 mg/dL (ref 2.5–4.6)

## 2022-11-19 LAB — MAGNESIUM
Magnesium: 1.9 mg/dL (ref 1.7–2.4)
Magnesium: 1.9 mg/dL (ref 1.7–2.4)

## 2022-11-19 LAB — MRSA NEXT GEN BY PCR, NASAL: MRSA by PCR Next Gen: NOT DETECTED

## 2022-11-19 LAB — AMMONIA: Ammonia: 13 umol/L (ref 9–35)

## 2022-11-19 MED ORDER — CHLORHEXIDINE GLUCONATE CLOTH 2 % EX PADS
6.0000 | MEDICATED_PAD | Freq: Every day | CUTANEOUS | Status: DC
  Administered 2022-11-19 – 2022-11-24 (×5): 6 via TOPICAL

## 2022-11-19 MED ORDER — ESCITALOPRAM OXALATE 10 MG PO TABS
10.0000 mg | ORAL_TABLET | Freq: Every day | ORAL | Status: DC
  Administered 2022-11-20 – 2022-12-01 (×11): 10 mg
  Filled 2022-11-19 (×11): qty 1

## 2022-11-19 MED ORDER — INSULIN ASPART 100 UNIT/ML IJ SOLN
0.0000 [IU] | INTRAMUSCULAR | Status: DC
  Administered 2022-11-19 (×2): 2 [IU] via SUBCUTANEOUS
  Administered 2022-11-20: 1 [IU] via SUBCUTANEOUS
  Administered 2022-11-20: 2 [IU] via SUBCUTANEOUS

## 2022-11-19 MED ORDER — LINEZOLID 600 MG/300ML IV SOLN
600.0000 mg | Freq: Two times a day (BID) | INTRAVENOUS | Status: DC
  Administered 2022-11-19 – 2022-11-23 (×9): 600 mg via INTRAVENOUS
  Filled 2022-11-19 (×11): qty 300

## 2022-11-19 MED ORDER — LACTATED RINGERS IV BOLUS
1000.0000 mL | Freq: Once | INTRAVENOUS | Status: AC
  Administered 2022-11-19: 1000 mL via INTRAVENOUS

## 2022-11-19 MED ORDER — DEXTROSE 50 % IV SOLN: 25.0000 mL | Freq: Once | INTRAVENOUS | Status: DC

## 2022-11-19 MED ORDER — CALCIUM GLUCONATE-NACL 1-0.675 GM/50ML-% IV SOLN
1.0000 g | Freq: Once | INTRAVENOUS | Status: AC
  Administered 2022-11-19: 1000 mg via INTRAVENOUS
  Filled 2022-11-19: qty 50

## 2022-11-19 MED ORDER — CLINDAMYCIN PHOSPHATE 600 MG/50ML IV SOLN
600.0000 mg | Freq: Four times a day (QID) | INTRAVENOUS | Status: DC
  Administered 2022-11-19 (×2): 600 mg via INTRAVENOUS
  Filled 2022-11-19 (×3): qty 50

## 2022-11-19 MED ORDER — ATORVASTATIN CALCIUM 80 MG PO TABS
80.0000 mg | ORAL_TABLET | Freq: Every day | ORAL | Status: DC
  Administered 2022-11-19 – 2022-11-30 (×12): 80 mg
  Filled 2022-11-19 (×2): qty 1
  Filled 2022-11-19 (×3): qty 2
  Filled 2022-11-19: qty 1
  Filled 2022-11-19 (×2): qty 2
  Filled 2022-11-19 (×2): qty 1
  Filled 2022-11-19: qty 2
  Filled 2022-11-19: qty 1

## 2022-11-19 MED ORDER — POLYETHYLENE GLYCOL 3350 17 G PO PACK: 17.0000 g | PACK | Freq: Every day | ORAL | Status: DC | PRN

## 2022-11-19 MED ORDER — HYDROCERIN EX CREA
TOPICAL_CREAM | Freq: Every day | CUTANEOUS | Status: DC
  Filled 2022-11-19: qty 113

## 2022-11-19 MED ORDER — HEPARIN SODIUM (PORCINE) 5000 UNIT/ML IJ SOLN
5000.0000 [IU] | Freq: Three times a day (TID) | INTRAMUSCULAR | Status: DC
  Administered 2022-11-19 – 2022-11-21 (×6): 5000 [IU] via SUBCUTANEOUS
  Filled 2022-11-19 (×6): qty 1

## 2022-11-19 MED ORDER — VITAL 1.5 CAL PO LIQD
1000.0000 mL | ORAL | Status: DC
  Administered 2022-11-19 – 2022-11-29 (×10): 1000 mL
  Filled 2022-11-19 (×7): qty 1000

## 2022-11-19 MED ORDER — ONDANSETRON HCL 4 MG/2ML IJ SOLN
4.0000 mg | Freq: Four times a day (QID) | INTRAMUSCULAR | Status: DC | PRN
  Filled 2022-11-19: qty 2

## 2022-11-19 MED ORDER — ORAL CARE MOUTH RINSE
15.0000 mL | OROMUCOSAL | Status: DC
  Administered 2022-11-19 – 2022-11-20 (×5): 15 mL via OROMUCOSAL

## 2022-11-19 MED ORDER — ORAL CARE MOUTH RINSE: 15.0000 mL | OROMUCOSAL | Status: DC | PRN

## 2022-11-19 MED ORDER — DOCUSATE SODIUM 100 MG PO CAPS: 100.0000 mg | ORAL_CAPSULE | Freq: Two times a day (BID) | ORAL | Status: DC | PRN

## 2022-11-19 MED ORDER — LACTATED RINGERS IV SOLN: INTRAVENOUS | Status: DC

## 2022-11-19 MED ORDER — ASPIRIN 81 MG PO CHEW
81.0000 mg | CHEWABLE_TABLET | Freq: Every day | ORAL | Status: DC
  Administered 2022-11-20 – 2022-11-22 (×3): 81 mg
  Filled 2022-11-19 (×3): qty 1

## 2022-11-19 MED ORDER — PROSOURCE TF20 ENFIT COMPATIBL EN LIQD
60.0000 mL | Freq: Every day | ENTERAL | Status: DC
  Administered 2022-11-19 – 2022-11-21 (×3): 60 mL
  Filled 2022-11-19 (×3): qty 60

## 2022-11-19 MED ORDER — ALBUMIN HUMAN 5 % IV SOLN
25.0000 g | Freq: Once | INTRAVENOUS | Status: AC
  Administered 2022-11-19: 25 g via INTRAVENOUS
  Filled 2022-11-19 (×2): qty 500

## 2022-11-19 MED ORDER — INSULIN ASPART 100 UNIT/ML IV SOLN: 10.0000 [IU] | Freq: Once | INTRAVENOUS | Status: DC

## 2022-11-19 NOTE — Procedures (Addendum)
Cortrak  Person Inserting Tube:  Alroy Dust, Macai Sisneros L, RD Tube Type:  Cortrak - 43 inches Tube Size:  10 Tube Location:  Left nare Secured by: Bridle Technique Used to Measure Tube Placement:  Marking at nare/corner of mouth Cortrak Secured At:  61 cm   Cortrak Tube Team Note:  Consult received to place a Cortrak feeding tube.   Addendum: Cortrak looping upwards with tip pointing towards GE junction. Cortrak pulled back 2 cm, left at 61 cm. Repeat x-ray ordered.   X-ray is required, abdominal x-ray has been ordered by the Cortrak team. Please confirm tube placement before using the Cortrak tube.   If the tube becomes dislodged please keep the tube and contact the Cortrak team at [email or web address] for replacement.  If after hours and replacement cannot be delayed, place a NG tube and confirm placement with an abdominal x-ray.    Hermina Barters RD, LDN Clinical Dietitian See Shea Evans for contact information.

## 2022-11-19 NOTE — Consult Note (Addendum)
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 Also used for abrasion of the hand, chafing of the skin, dermatitis due to sweating and friction, friction dermatitis, friction eczema, and genital/thigh intertrigo.   Wound type:Moisture, pressure, trauma Pressure Injury POA: Yes Measurement: Right Buttock, medial, Stage 2:  2cm x 1cm x 0.2cm with red, moist wound bed, scant exudate Sacrum: 8cm x 9cm area of erythema with 5-6 small areas of partial thickness skin loss, the largest of which measures 1cm x 0.6cm x 0.1cm, pink, moist Left buttock: 8cm x 7cm area of maroon/purple skin discoloration consistent with DTPI, no drainage, no wound bed Right anterior thigh (Partial thickness): 3cm x 2.2cm x 0.1cm with pink wound bed and scant serous exudate. Small area where epidermis is pushed to side of wound. Right heel with callus and DTPI:  5cm x 5cm purple discoloration under callus.No drainage, no wound bed. Subpannicular skin fold: erythema intertrigo with partial thickness skin loss at mons pubis. Patient with powder in this area. Wound bed:As described above Drainage (amount, consistency, odor) scant serous from partial thickness wounds Periwound:intact, erythematous Dressing procedure/placement/frequency: Patient is on a mattress replacement with low air loss feature as she is in the ICU. This is to be continues when transferred to floor. She is incontinent of stool and has an indwelling urinary catheter. I have provided guidance for topical care of the above mentioned lesions using a NS cleanse with a topical dressing consisting of xeroform  gauze and securing with silicone foam dressings. Heels are to be placed into [employer]. The area of erythema intertrigo at the sub pannicular area will be cleansed daily and our house antimicrobial moisture wicking textile Estrella Deeds # [phone]) applied according to the instructions below:  Measure and cut length of InterDry to fit in skin folds that have skin breakdown Tuck InterDry fabric into skin folds in a single layer, allow for 2 inches of overhang from skin edges to allow for wicking to occur May remove to bathe; dry area thoroughly and then tuck into affected areas again Do not apply any creams or ointments when using InterDry DO NOT THROW AWAY FOR 5 DAYS unless soiled with stool DO NOT [facility] product, this will inactivate the silver in the material  New sheet of Interdry should be applied after 5 days of use if patient continues to have skin breakdown    [place] nursing team will not follow, but will remain available to this patient, the nursing and medical teams.  Please re-consult if needed.  Thank you for inviting [place] to participate in this patient's Plan of Care.  Samantha Flakes, MSN, RN, CNS, [place], Serita Grammes, [employer], [employer] phone:  [phone]

## 2022-11-19 NOTE — Progress Notes (Signed)
NAMEJahari [name], MRN:  [id number], DOB:  September 17, 1956, LOS: 0 ADMISSION DATE:  11/05/2022 CONSULTATION DATE:  11/19/2022 REFERRING MD:  Roxanne Mins - EDP CHIEF COMPLAINT:  AMS, elevated lactic acid  History of Present Illness:  67 year old woman who presented to [facility] ED 1/21 via EMS for AMS. PMHx significant for HTN, HLD, T2DM, CVA with R-sided deficits, LLE DVT (12/2021, previously on Eliquis), depression.  Per chart review, patient was seen by her home health nurse on date of admission who was concerned that patient was dehydrated. Additionally, husband reported onset of acute confusion noted 2 days PTA (progressively worsening) as well as nausea/vomiting.   On ED arrival, normothermic, tachycardic to 110s, normotensive (intermittently soft BP with SBP no lower than 90s), tachypneic to 20s with SpO2 100%. GCS 12 with baseline R-sided deficits. Labs were notable for WBC 25.2, Hgb 11.5 (baseline), Plt 94 (baseline ~200), INR 1.5. Na 138, K 5.4, CO2 21, Cr 4.12 (baseline 1.2), BUN 48; albumin undetectably low at < 1.5. AST 63, ALT 43, AP 170, Tbili 1.9. Trop 213 > 33. UA with large Hgb, mod bili, protein, mod leuks, many bacteria. LA uptrending 5.3 > 6.3 > now  greater than 9.0. CT Head NAICA. CT Chest/A/P with extensive subcutaneous gas/debris within L perineal soft tissue, tracking along posterior L labia majora/perianal subcutaneous tissue with surrounding inflammation, no loculated fluid collection. Broad-spectrum cefepime/vanc/flagyl was initiated as well as aggressive fluid resuscitation. CCS consulted.  PCCM consulted for ICU admission in the setting of developing sepsis, persistent lactic elevation.  Pertinent Medical History:   Past Medical History:  Diagnosis Date   CVA (cerebral vascular accident) ([place])    R-sided deficits   Deep vein thrombosis (DVT) ([place])    Previously on Eliquis   HTN (hypertension)    Hyperlipidemia    T2DM (type 2 diabetes mellitus) ([place])    Significant Hospital  Events: Including procedures, antibiotic start and stop dates in addition to other pertinent events   1/21 - Presented to [facility] for AMS x 2 days. Elevated WBC, soft BP, dirty UA with frank pus on I&O cath. CT Head NAICA, CT A/P as above with concern for perianal ST infection. CCS consulted. Lactate uptrending > 9.0. PCCM consulted for ICU admission.  Interim History / Subjective:  BP stable Altered and moaning; not following commands; chronic right sided deficits  Objective:  Blood pressure (!) 90/55, pulse 85, temperature (!) 96.4 F (35.8 C), temperature source Axillary, resp. rate 14, height 5' (1.524 m), weight 86.6 kg, SpO2 94 %.        Intake/Output Summary (Last 24 hours) at 11/19/2022 1003 Last data filed at 11/19/2022 1000 Gross per 24 hour  Intake 3205.43 ml  Output 8 ml  Net 3197.43 ml    Filed Weights   11/27/2022 2236 11/19/22 0544  Weight: 125.2 kg 86.6 kg   Physical Examination: General: ill appearing female in nad HEENT: MM pink/moist Neuro: altered and moaning; moves left side spontaneously not following commands; chronic r sided deficits CV: s1s2, tachys 110s, no m/r/g PULM:  dim clear BS bilaterally; on room air GI: soft, bsx4 active  Extremities: warm/dry, rle edema  Skin: no rashes or lesions    Resolved Hospital Problem List:    Assessment & Plan:  Sepsis, suspect urologic source versus progressive SSTI Lactic acidosis Plan: -continue IV fluids -map goal >65 -trend LA -broad spectrum abx; follow cultures -trend wbc/fever curve  SSTI of perineal soft tissues CT A/P demonstrating extensive subcutaneous gas/debris within L  perineal soft tissue, tracking along posterior L labia majora/perianal subcutaneous tissue with surrounding inflammation, no loculated fluid collection. Plan: - CCS consulted; no plan for surgery - continue broad spectrum abx; follow cultures  Altered mental status, likely in the setting of UTI -CT head NAICA Plan: -check  ammonia -treat sepsis as above -limit sedating meds  Acute renal failure versus severe AKI,  Oliguria ?prerenal with dehydration from GI losses, ?postrenal due to UTI Mild hyperkalemia No hydronephrosis or significant urinary system abnormalities noted on CT A/P 11/29/22. Plan: -IV fluids -renal ultrasound -give calcium -Trend BMP / urinary output -Replace electrolytes as indicated -Avoid nephrotoxic agents, ensure adequate renal perfusion  History of CVA with R-sided deficits Hypertension Hyperlipidemia Plan: -resume asa and statin -hold anti-hypertensives -PT/OT/SLP  History of LLE DVT US LE Dopplers 12/2021 with acute DVT of L PT veins/L peroneal veins; RLE negative. - Low threshold for repeat LE dopplers, given asymmetric RLE swelling (though this may be just from limited use due to residual deficits post-CVA) - No longer on [facility] Plan: -telemetry monitoring -heparin subcu -resume asa  R gluteal pressure wound, POA Plan: - WOC consult  T2DM Plan: -ssi and cbg monitoring  Depression Plan: -resume lexapro   Anemia Thrombocytopenia Plan: -trend cbc  At risk for malnutrition - Albumin < 1.5 on admission Plan: -will place cortrack -dietician consult   Best Practice: (right click and "Reselect all SmartList Selections" daily)   Diet/type: NPO DVT prophylaxis: SCDs, SQH GI prophylaxis: N/A Lines: N/A Foley:  Yes, and it is still needed Code Status:  full code Last date of multidisciplinary goals of care discussion [Pending]  Labs:  CBC: Recent Labs  Lab 29-Nov-2022 1925 11/19/22 0623  WBC 25.2* 26.3*  NEUTROABS 23.5*  --   HGB 11.5* 10.6*  HCT 35.2* 32.6*  MCV 110.0* 108.7*  PLT 94* 63*    Basic Metabolic Panel: Recent Labs  Lab Nov 29, 2022 2107 11/19/22 0623  NA 138 139  K 5.4* 5.2*  CL 102 102  CO2 21* 17*  GLUCOSE 226* 193*  BUN 48* 46*  CREATININE 4.12* 3.82*  CALCIUM 8.3* 8.5*  MG  --  1.9  PHOS  --  4.3    GFR: Estimated  Creatinine Clearance: 14.3 mL/min (A) (by C-G formula based on SCr of 3.82 mg/dL (H)). Recent Labs  Lab 2022/11/29 1925 2022-11-29 2107 11/19/22 0045 11/19/22 0623  WBC 25.2*  --   --  26.3*  LATICACIDVEN 5.3* 6.3* >9.0* 8.1*    Liver Function Tests: Recent Labs  Lab 2022/11/29 2107 11/19/22 0623  AST 63* 64*  ALT 43 40  ALKPHOS 170* 192*  BILITOT 1.9* 1.9*  PROT 5.7* 6.1*  ALBUMIN <1.5* 1.6*    No results for input(s): "LIPASE", "AMYLASE" in the last 168 hours. No results for input(s): "AMMONIA" in the last 168 hours.  ABG: No results found for: "PHART", "PCO2ART", "PO2ART", "HCO3", "TCO2", "ACIDBASEDEF", "O2SAT"   Coagulation Profile: Recent Labs  Lab 2022/11/29 1925  INR 1.5*    Cardiac Enzymes: No results for input(s): "CKTOTAL", "CKMB", "CKMBINDEX", "TROPONINI" in the last 168 hours.  HbA1C: Hgb A1c MFr Bld  Date/Time Value Ref Range Status  08/13/2022 11:16 AM 6.1 (H) 4.8 - 5.6 % Final    Comment:             Prediabetes: 5.7 - 6.4          Diabetes: >6.4          Glycemic control for adults with diabetes: <7.0  01/14/2022 02:16 AM 13.1 (H) 4.8 - 5.6 % Final    Comment:    (NOTE) Pre diabetes:          5.7%-6.4%  Diabetes:              >6.4%  Glycemic control for   <7.0% adults with diabetes    CBG: Recent Labs  Lab 11/28/2022 1924 11/19/22 0600  GLUCAP 172* 187*    Review of Systems:   Patient is encephalopathic and/or intubated. Therefore history has been obtained from chart review.   Past Medical History:  She,  has a past medical history of CVA (cerebral vascular accident) ([place]), Deep vein thrombosis (DVT) ([place]), HTN (hypertension), Hyperlipidemia, and T2DM (type 2 diabetes mellitus) ([place]).   Surgical History:   Past Surgical History:  Procedure Laterality Date   TUBAL LIGATION     23 yrs ago    Social History:   reports that she has never smoked. She has never used smokeless tobacco. She reports that she does not drink alcohol and  does not use drugs.   Family History:  Her family history includes Hypertension in her mother.   Allergies: Allergies  Allergen Reactions   Penicillins Rash    Home Medications: Prior to Admission medications   Medication Sig Start Date End Date Taking? Authorizing Provider  Accu-Chek Softclix Lancets lancets Use to test blood sugars 4 times daily. 02/13/22   Angiulli, Lavon Paganini, PA-C  acetaminophen (TYLENOL) 325 MG tablet Take 2 tablets (650 mg total) by mouth every 4 (four) hours as needed for mild pain (or temp > 37.5 C (99.5 F)). 02/12/22   Angiulli, Lavon Paganini, PA-C  amLODipine (NORVASC) 5 MG tablet Take 1 tablet (5 mg total) by mouth daily. 10/15/22   [name], PA-C  aspirin (ASPIRIN 81) 81 MG chewable tablet Chew 1 tablet (81 mg total) by mouth daily. 08/16/22   [name], PA-C  atorvastatin (LIPITOR) 80 MG tablet Take 1 tablet (80 mg total) by mouth daily at 6 PM. 08/13/22   Drubel, Ria Comment, PA-C  Baclofen 5 MG TABS Take 5 mg by mouth at bedtime. 08/13/22   [name], PA-C  Blood Pressure Monitoring DEVI Check bp in AM and PM 03/01/22   Drubel, Ria Comment, PA-C  cyanocobalamin 1000 MCG tablet Take 1,000 mcg by mouth daily. Gummy    [provider]  dextromethorphan 15 MG/5ML syrup Take 10 mLs (30 mg total) by mouth 4 (four) times daily as needed for cough. 10/26/22   Simmons-Robinson, Makiera, MD  escitalopram (LEXAPRO) 10 MG tablet Take 1 tablet (10 mg total) by mouth daily. 08/13/22   [name], PA-C  glucose blood (ACCU-CHEK GUIDE) test strip Use to test blood sugars 4 times daily. 02/13/22   Angiulli, Lavon Paganini, PA-C  Insulin Glargine (BASAGLAR KWIKPEN) 100 UNIT/ML Inject 16 Units into the skin daily. 08/13/22   [name], PA-C  Insulin Pen Needle 32G X 4 MM MISC Use to inject insulin up to 4 times daily. 08/13/22   [name], PA-C  loperamide (IMODIUM A-D) 2 MG tablet Take by mouth.    [provider]  metoprolol tartrate (LOPRESSOR)  50 MG tablet Take 1 tablet (50 mg total) by mouth 2 (two) times daily. 08/13/22   [name], PA-C  nystatin cream (MYCOSTATIN) Apply 1 Application topically 2 (two) times daily. Use on area of concern. Can use with steroid cream 10/15/22   Drubel, Ria Comment, PA-C  ondansetron (ZOFRAN-ODT) 4 MG disintegrating tablet Take 1 tablet (  4 mg total) by mouth every 8 (eight) hours as needed for nausea or vomiting. 11/12/22   [name], PA-C  triamcinolone cream (KENALOG) 0.1 % Apply 1 Application topically 2 (two) times daily. Use on area of concern. Can use with nystatin. Do not use for more than 10 days in a row 10/15/22   [name], PA-C    Critical care time: 35 minutes    Lidia Collum, PA-C [facility] 11/19/22 10:03 AM  Please see [email or web address] for pager details.  From 7A-7P if no response, please call [phone] After hours, please call ELink [phone]

## 2022-11-19 NOTE — ED Notes (Signed)
ED TO INPATIENT HANDOFF REPORT  ED Nurse Name and Phone #:   Morrie Sheldon EMT-P [phone]    S Name/Age/Gender Samantha [name] 67 y.o. female Room/Bed: 035C/035C  Code Status   Code Status: Full Code  Home/SNF/Other Home Is this baseline? No   Triage Complete: Triage complete  Chief Complaint Sepsis [facility]) [A41.9]  Triage Note Pt BIB [facility] from home. Yesterday pt became nonverbal, seen by home health nurse today who told family pt was dehydrated. Has baseline R sided weakness from stroke. A/ox4 at baseline. Given 349ml LR with EMS. EMS VS- HR 114 ST, 122/75, 98% RA, RR 34, CBG 207, Co2 24   Allergies Allergies  Allergen Reactions   Penicillins Rash    Level of Care/Admitting Diagnosis ED Disposition     ED Disposition  Admit   Condition  --   Comment  Hospital Area: [facility] [[id number]]  Level of Care: ICU [6]  May admit patient to [facility] or [facility] if equivalent level of care is available:: No  Covid Evaluation: Asymptomatic - no recent exposure (last 10 days) testing not required  Diagnosis: Sepsis [facility]) [[id number]]  Admitting Physician: Collier Bullock [[id number]]  Attending Physician: Collier Bullock [[id number]]  Certification:: I certify this patient will need inpatient services for at least 2 midnights  Estimated Length of Stay: 5          B Medical/Surgery History Past Medical History:  Diagnosis Date   CVA (cerebral vascular accident) ([place])    R-sided deficits   Deep vein thrombosis (DVT) ([place])    Previously on Eliquis   HTN (hypertension)    Hyperlipidemia    T2DM (type 2 diabetes mellitus) ([place])    Past Surgical History:  Procedure Laterality Date   TUBAL LIGATION     23 yrs ago     A IV Location/Drains/Wounds Patient Lines/Drains/Airways Status     Active Line/Drains/Airways     Name Placement date Placement time Site Days   Peripheral IV 11/22/2022 22 G Anterior;Right Forearm 2022-11-22  1930  Forearm  1    Peripheral IV 11/19/22 22 G Posterior;Right Hand 11/19/22  0255  Hand  less than 1   External Urinary Catheter 11/22/2022  2016  --  1            Intake/Output Last 24 hours  Intake/Output Summary (Last 24 hours) at 11/19/2022 0444 Last data filed at 11/19/2022 0203 Gross per 24 hour  Intake 1000 ml  Output --  Net 1000 ml    Labs/Imaging Results for orders placed or performed during the hospital encounter of 2022/11/22 (from the past 48 hour(s))  CBG monitoring, ED     Status: Abnormal   Collection Time: 11-22-22  7:24 PM  Result Value Ref Range   Glucose-Capillary 172 (H) 70 - 99 mg/dL    Comment: Glucose reference range applies only to samples taken after fasting for at least 8 hours.  Lactic acid, plasma     Status: Abnormal   Collection Time: 2022/11/22  7:25 PM  Result Value Ref Range   Lactic Acid, Venous 5.3 (HH) 0.5 - 1.9 mmol/L    Comment: CRITICAL RESULT CALLED TO, READ BACK BY AND VERIFIED WITH Jeannie Done COGAN RN 11-22-22 2047 Wiliam Ke Performed at [facility], [place] [place], [place] [address]   CBC with Differential     Status: Abnormal   Collection Time: 2022-11-22  7:25 PM  Result Value Ref Range   WBC 25.2 (H)  4.0 - 10.5 K/uL   RBC 3.20 (L) 3.87 - 5.11 MIL/uL   Hemoglobin 11.5 (L) 12.0 - 15.0 g/dL   HCT 83.3 (L) 82.5 - 05.3 %   MCV 110.0 (H) 80.0 - 100.0 fL   MCH 35.9 (H) 26.0 - 34.0 pg   MCHC 32.7 30.0 - 36.0 g/dL   RDW 97.6 (H) 73.4 - 19.3 %   Platelets 94 (L) 150 - 400 K/uL    Comment: REPEATED TO VERIFY   nRBC 0.1 0.0 - 0.2 %   Neutrophils Relative % 93 %   Neutro Abs 23.5 (H) 1.7 - 7.7 K/uL   Lymphocytes Relative 3 %   Lymphs Abs 0.7 0.7 - 4.0 K/uL   Monocytes Relative 2 %   Monocytes Absolute 0.5 0.1 - 1.0 K/uL   Eosinophils Relative 0 %   Eosinophils Absolute 0.0 0.0 - 0.5 K/uL   Basophils Relative 0 %   Basophils Absolute 0.1 0.0 - 0.1 K/uL   Immature Granulocytes 2 %   Abs Immature Granulocytes 0.38 (H) 0.00 - 0.07 K/uL     Comment: Performed at [facility], [address], [place], [place] [address]  Protime-INR     Status: Abnormal   Collection Time: 2022-12-01  7:25 PM  Result Value Ref Range   Prothrombin Time 18.0 (H) 11.4 - 15.2 seconds   INR 1.5 (H) 0.8 - 1.2    Comment: (NOTE) INR goal varies based on device and disease states. Performed at [facility], [address], [place], [place] [address]   APTT     Status: None   Collection Time: 2022-12-01  7:25 PM  Result Value Ref Range   aPTT 30 24 - 36 seconds    Comment: Performed at [facility], [address] [place], [place] [address]  Blood Culture (routine x 2)     Status: None (Preliminary result)   Collection Time: 12/01/2022  7:25 PM   Specimen: BLOOD LEFT FOREARM  Result Value Ref Range   Specimen Description BLOOD LEFT FOREARM    Special Requests      BOTTLES DRAWN AEROBIC AND ANAEROBIC Blood Culture results may not be optimal due to an inadequate volume of blood received in culture bottles Performed at [facility], [address], [place], [place] [address]    Culture PENDING    Report Status PENDING   Troponin I (High Sensitivity)     Status: Abnormal   Collection Time: 12-01-2022  7:25 PM  Result Value Ref Range   Troponin I (High Sensitivity) 213 (HH) <18 ng/L    Comment: CRITICAL RESULT CALLED TO, READ BACK BY AND VERIFIED WITH CAMRYN COGAN RN 12-01-2022 2110 M KOROLESKI (NOTE) Elevated high sensitivity troponin I (hsTnI) values and significant  changes across serial measurements may suggest ACS but many other  chronic and acute conditions are known to elevate hsTnI results.  Refer to the "Links" section for chest pain algorithms and additional  guidance. Performed at [facility], [address], [place], [place] [address]   Resp panel by RT-PCR (RSV, Flu A&B, Covid) Anterior Nasal Swab     Status: None   Collection Time: 12/01/22  7:38 PM   Specimen: Anterior Nasal Swab  Result Value Ref Range    SARS Coronavirus 2 by RT PCR NEGATIVE NEGATIVE    Comment: (NOTE) SARS-CoV-2 target nucleic acids are NOT DETECTED.  The SARS-CoV-2 RNA is generally detectable in upper respiratory specimens during  the acute phase of infection. The lowest concentration of SARS-CoV-2 viral copies this assay can detect is 138 copies/mL. A negative result does not preclude SARS-Cov-2 infection and should not be used as the sole basis for treatment or other patient management decisions. A negative result may occur with  improper specimen collection/handling, submission of specimen other than nasopharyngeal swab, presence of viral mutation(s) within the areas targeted by this assay, and inadequate number of viral copies(<138 copies/mL). A negative result must be combined with clinical observations, patient history, and epidemiological information. The expected result is Negative.  Fact Sheet for Patients:  BloggerCourse.com  Fact Sheet for Healthcare Providers:  SeriousBroker.it  This test is no t yet approved or cleared by the [place] FDA and  has been authorized for detection and/or diagnosis of SARS-CoV-2 by FDA under an Emergency Use Authorization (EUA). This EUA will remain  in effect (meaning this test can be used) for the duration of the COVID-19 declaration under Section 564(b)(1) of the Act, 21 U.S.C.section 360bbb-3(b)(1), unless the authorization is terminated  or revoked sooner.       Influenza A by PCR NEGATIVE NEGATIVE   Influenza B by PCR NEGATIVE NEGATIVE    Comment: (NOTE) The Xpert Xpress SARS-CoV-2/FLU/RSV plus assay is intended as an aid in the diagnosis of influenza from Nasopharyngeal swab specimens and should not be used as a sole basis for treatment. Nasal washings and aspirates are unacceptable for Xpert Xpress SARS-CoV-2/FLU/RSV testing.  Fact Sheet for Patients: BloggerCourse.com  Fact  Sheet for Healthcare Providers: SeriousBroker.it  This test is not yet approved or cleared by the [place] FDA and has been authorized for detection and/or diagnosis of SARS-CoV-2 by FDA under an Emergency Use Authorization (EUA). This EUA will remain in effect (meaning this test can be used) for the duration of the COVID-19 declaration under Section 564(b)(1) of the Act, 21 U.S.C. section 360bbb-3(b)(1), unless the authorization is terminated or revoked.     Resp Syncytial Virus by PCR NEGATIVE NEGATIVE    Comment: (NOTE) Fact Sheet for Patients: BloggerCourse.com  Fact Sheet for Healthcare Providers: SeriousBroker.it  This test is not yet approved or cleared by the [place] FDA and has been authorized for detection and/or diagnosis of SARS-CoV-2 by FDA under an Emergency Use Authorization (EUA). This EUA will remain in effect (meaning this test can be used) for the duration of the COVID-19 declaration under Section 564(b)(1) of the Act, 21 U.S.C. section 360bbb-3(b)(1), unless the authorization is terminated or revoked.  Performed at [facility], [address], [place], [place] [address]   Lactic acid, plasma     Status: Abnormal   Collection Time: 11/22/2022  9:07 PM  Result Value Ref Range   Lactic Acid, Venous 6.3 (HH) 0.5 - 1.9 mmol/L    Comment: CRITICAL VALUE NOTED. VALUE IS CONSISTENT WITH PREVIOUSLY REPORTED/CALLED VALUE Performed at [facility], [address] [place], [place] [address]   Comprehensive metabolic panel     Status: Abnormal   Collection Time: 10/30/2022  9:07 PM  Result Value Ref Range   Sodium 138 135 - 145 mmol/L   Potassium 5.4 (H) 3.5 - 5.1 mmol/L   Chloride 102 98 - 111 mmol/L   CO2 21 (L) 22 - 32 mmol/L   Glucose, Bld 226 (H) 70 - 99 mg/dL    Comment: Glucose reference range applies only to samples taken after fasting for at least 8 hours.    BUN 48 (H) 8 -  23 mg/dL   Creatinine, Ser 1.95 (H) 0.44 - 1.00 mg/dL   Calcium 8.3 (L) 8.9 - 10.3 mg/dL   Total Protein 5.7 (L) 6.5 - 8.1 g/dL   Albumin <0.9 (L) 3.5 - 5.0 g/dL   AST 63 (H) 15 - 41 U/L   ALT 43 0 - 44 U/L    Comment: RESULT CONFIRMED BY MANUAL DILUTION   Alkaline Phosphatase 170 (H) 38 - 126 U/L   Total Bilirubin 1.9 (H) 0.3 - 1.2 mg/dL   GFR, Estimated 11 (L) >60 mL/min    Comment: (NOTE) Calculated using the CKD-EPI Creatinine Equation (2021)    Anion gap 15 5 - 15    Comment: Performed at [facility], [address] [place], [place] [address]  Troponin I (High Sensitivity)     Status: Abnormal   Collection Time: 11/09/2022  9:07 PM  Result Value Ref Range   Troponin I (High Sensitivity) 33 (H) <18 ng/L    Comment: (NOTE) Elevated high sensitivity troponin I (hsTnI) values and significant  changes across serial measurements may suggest ACS but many other  chronic and acute conditions are known to elevate hsTnI results.  Refer to the "Links" section for chest pain algorithms and additional  guidance. Performed at [facility], [address], [place], [place] [address]   Urinalysis, Routine w reflex microscopic Urine, In & Out Cath     Status: Abnormal   Collection Time: 11/15/2022 10:00 PM  Result Value Ref Range   Color, Urine YELLOW YELLOW   APPearance TURBID (A) CLEAR   Specific Gravity, Urine 1.025 1.005 - 1.030   pH 5.5 5.0 - 8.0   Glucose, UA NEGATIVE NEGATIVE mg/dL   Hgb urine dipstick LARGE (A) NEGATIVE   Bilirubin Urine MODERATE (A) NEGATIVE   Ketones, ur NEGATIVE NEGATIVE mg/dL   Protein, ur 998 (A) NEGATIVE mg/dL   Nitrite NEGATIVE NEGATIVE   Leukocytes,Ua MODERATE (A) NEGATIVE    Comment: Performed at [facility], [address] [place], [place] [address]  Urinalysis, Microscopic (reflex)     Status: Abnormal   Collection Time: 11/01/2022 10:00 PM  Result Value Ref Range   RBC / HPF 11-20 0 - 5 RBC/hpf   WBC, UA >50 0 - 5  WBC/hpf   Bacteria, UA MANY (A) NONE SEEN   Squamous Epithelial / HPF 6-10 0 - 5 /HPF    Comment: Performed at [facility], [address], [place], [place] [address]  Lactic acid, plasma     Status: Abnormal   Collection Time: 11/19/22 12:45 AM  Result Value Ref Range   Lactic Acid, Venous >9.0 (HH) 0.5 - 1.9 mmol/L    Comment: RESULT CONFIRMED BY MANUAL DILUTION Performed at [facility], [address], [place], [place] [address]    CT CHEST ABDOMEN PELVIS WO CONTRAST  Result Date: 11/19/2022 CLINICAL DATA:  Sepsis EXAM: CT CHEST, ABDOMEN AND PELVIS WITHOUT CONTRAST TECHNIQUE: Multidetector CT imaging of the chest, abdomen and pelvis was performed following the standard protocol without IV contrast. RADIATION DOSE REDUCTION: This exam was performed according to the departmental dose-optimization program which includes automated exposure control, adjustment of the mA and/or kV according to patient size and/or use of iterative reconstruction technique. COMPARISON:  None Available. FINDINGS: CT CHEST FINDINGS Cardiovascular: Moderate multi-vessel coronary artery calcification. Global cardiac size within normal limits. No pericardial effusion. Central pulmonary arteries are enlarged in keeping with changes of pulmonary arterial hypertension. Mild  atherosclerotic calcification within the thoracic aorta. No aortic aneurysm. Mediastinum/Nodes: No enlarged mediastinal, hilar, or axillary lymph nodes. Thyroid gland, trachea, and esophagus demonstrate no significant findings. Lungs/Pleura: Lungs are clear. No pleural effusion or pneumothorax. Musculoskeletal: No acute bone abnormality. No lytic or blastic bone lesion. CT ABDOMEN PELVIS FINDINGS Hepatobiliary: No focal liver abnormality is seen. No gallstones, gallbladder wall thickening, or biliary dilatation. Pancreas: Unremarkable. No pancreatic ductal dilatation or surrounding inflammatory changes. Spleen: Unremarkable Adrenals/Urinary Tract:  The adrenal glands are unremarkable. The kidneys are normal in position. Marked left renal cortical scarring and atrophy. The right kidney is normal in size. Nonobstructing calculi are noted within the lower pole of the kidneys bilaterally measuring up to 7 mm on the left and 4 mm on the right. Additional vascular calcifications noted within the renal hila. No hydronephrosis. No ureteral calculi. The bladder is unremarkable. Stomach/Bowel: The stomach, small bowel, and large bowel are unremarkable. Appendix normal. No free intraperitoneal gas or fluid. Vascular/Lymphatic: Mild aortoiliac atherosclerotic calcification. No aortic aneurysm. No pathologic adenopathy within the abdomen and pelvis. Reproductive: Uterus and bilateral adnexa are unremarkable. Other: There is extensive subcutaneous gas seen within the left peroneal soft tissues along the posterior aspect of the left labial majora and within the perianal subcutaneous fat. There is superimposed debris within this region best appreciated on image # 125/3 without discrete loculated fluid collection identified. The subcutaneous gas, suggestive of an aggressive soft tissue infection, extends to the left levator ani musculature, but does not appear to extend into the deep pelvis. Additionally there is extension of subcutaneous gas and inflammatory changes superficial to the sacrum to the level of the iliac spines bilaterally. There is asymmetric extensive subcutaneous edema within the a dependent subcutaneous fat of the proximal right thigh which may relate to dependent positioning or a localized inflammatory process. No loculated fluid collection or subcutaneous gas noted in this region. Musculoskeletal: No acute bone abnormality. No lytic or blastic bone lesion. IMPRESSION: 1. Extensive subcutaneous gas and debris within the left perineal soft tissues along the posterior aspect of the left labia majora and within the perianal subcutaneous in keeping with an  aggressive soft tissue infection. Inflammatory change extends to the left levator ani musculature, but does not appear to extend into the deep pelvis, and extends superficial to the sacrum to the level of the iliac spines. No loculated subcutaneous fluid collection identified. 2. Asymmetric extensive subcutaneous edema within the dependent subcutaneous fat of the proximal right thigh which may relate to dependent positioning or a localized inflammatory process. No loculated fluid collection or subcutaneous gas noted in this region. 3. Moderate multi-vessel coronary artery calcification. 4. Morphologic changes in keeping with pulmonary arterial hypertension. 5. Marked left renal cortical scarring and atrophy. Mild bilateral nonobstructing nephrolithiasis. 6.  Aortic Atherosclerosis (ICD10-I70.0). Electronically Signed   By: Helyn Numbers M.D.   On: 11/19/2022 00:29   CT HEAD WO CONTRAST ( )  Result Date: 11/19/2022 CLINICAL DATA:  Altered mental status EXAM: CT HEAD WITHOUT CONTRAST TECHNIQUE: Contiguous axial images were obtained from the base of the skull through the vertex without intravenous contrast. RADIATION DOSE REDUCTION: This exam was performed according to the departmental dose-optimization program which includes automated exposure control, adjustment of the mA and/or kV according to patient size and/or use of iterative reconstruction technique. COMPARISON:  None Available. FINDINGS: Brain: There is no mass, hemorrhage or extra-axial collection. There is generalized atrophy without lobar predilection. Hypodensity of the white matter is most commonly associated with chronic microvascular  disease. 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 Vascular: No abnormal hyperdensity of the major intracranial arteries or dural venous sinuses. No intracranial atherosclerosis. Skull: The visualized skull base, calvarium and extracranial soft tissues are normal. Sinuses/Orbits: No  fluid levels or advanced mucosal thickening of the visualized paranasal sinuses. No mastoid or middle ear effusion. The orbits are normal. IMPRESSION: 1. No acute intracranial abnormality. 2. 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 3. Chronic microvascular ischemia. Electronically Signed   By: Ulyses Jarred M.D.   On: 11/19/2022 00:13   DG Chest Port 1 View  Result Date: 11/02/2022 CLINICAL DATA:  Sepsis, became nonverbal, dehydrated EXAM: PORTABLE CHEST 1 VIEW COMPARISON:  Portable exam 1951 hours compared to 04/13/2022 FINDINGS: Normal heart size, mediastinal contours, and pulmonary vascularity. Atherosclerotic calcification aorta. Lungs clear. No infiltrate, pleural effusion, or pneumothorax. No acute osseous findings. IMPRESSION: No acute abnormalities. Aortic Atherosclerosis (ICD10-I70.0). Electronically Signed   By: Lavonia Dana M.D.   On: 11/11/2022 20:15    Pending Labs Unresulted Labs (From admission, onward)     Start     Ordered   11/20/22 0500  CBC  Tomorrow morning,   R        11/19/22 0427   11/20/22 0500  Comprehensive metabolic panel  Tomorrow morning,   R        11/19/22 0427   11/20/22 0500  Magnesium  Tomorrow morning,   R        11/19/22 0427   11/20/22 0500  Phosphorus  Tomorrow morning,   R        11/19/22 0427   11/19/22 9629  Basic metabolic panel  Now then every 6 hours,   R (with TIMED occurrences)      11/19/22 0427   11/19/22 0428  Lactic acid, plasma  STAT Now then every 3 hours,   R (with STAT occurrences)      11/19/22 0428   11/19/22 0349  Comprehensive metabolic panel  ONCE - STAT,   STAT        11/19/22 0348   11/19/22 0349  CBC  ONCE - STAT,   STAT        11/19/22 0348   11/19/22 0349  Magnesium  ONCE - STAT,   STAT        11/19/22 0348   11/19/22 0349  Phosphorus  ONCE - STAT,   STAT        11/19/22 0348   11/05/2022 1938  Blood Culture (routine x 2)  (Septic presentation on arrival (screening labs, nursing and treatment orders for  obvious sepsis))  BLOOD CULTURE X 2,   STAT      11/19/2022 1938   11/17/2022 1938  Urine Culture  (Septic presentation on arrival (screening labs, nursing and treatment orders for obvious sepsis))  ONCE - URGENT,   URGENT       Question:  Indication  Answer:  Altered mental status (if no other cause identified)   11/06/2022 1938            Vitals/Pain Today's Vitals   11/19/22 0400 11/19/22 0415 11/19/22 0426 11/19/22 0430  BP: 119/73 112/76  (!) 115/97  Pulse: 100 (!) 108  (!) 113  Resp: 20 15  (!) 23  Temp:   98.6 F (37 C)   TempSrc:   Axillary   SpO2: 100% 100%  99%  Weight:      Height:  Isolation Precautions No active isolations  Medications Medications  lactated ringers infusion ( Intravenous New Bag/Given 11/19/22 0306)  ceFEPIme (MAXIPIME) 2 g in sodium chloride 0.9 % 100 mL IVPB (has no administration in time range)  vancomycin variable dose per unstable renal function (pharmacist dosing) (has no administration in time range)  albumin human 5 % solution 25 g (has no administration in time range)  docusate sodium (COLACE) capsule 100 mg (has no administration in time range)  polyethylene glycol (MIRALAX / GLYCOLAX) packet 17 g (has no administration in time range)  heparin injection 5,000 Units (has no administration in time range)  ondansetron (ZOFRAN) injection 4 mg (has no administration in time range)  lactated ringers bolus 1,000 mL (0 mLs Intravenous Stopped 04-Dec-2022 2139)  metroNIDAZOLE (FLAGYL) IVPB 500 mg (0 mg Intravenous Stopped December 04, 2022 2115)  vancomycin (VANCOCIN) IVPB 1000 mg/200 mL premix (0 mg Intravenous Stopped 12/04/2022 2116)  ondansetron (ZOFRAN) injection 4 mg (4 mg Intravenous Given 12/04/22 2002)  ceFEPIme (MAXIPIME) 2 g in sodium chloride 0.9 % 100 mL IVPB (0 g Intravenous Stopped December 04, 2022 2047)  lactated ringers bolus 1,000 mL (0 mLs Intravenous Stopped 11/19/22 0203)  lactated ringers bolus 1,000 mL (0 mLs Intravenous Stopped 11/19/22 0332)     Mobility    Unknown at this time    R Recommendations: See Admitting Provider Note  Report given to:   Additional Notes:

## 2022-11-19 NOTE — Consult Note (Signed)
Reason for Consult:sepsis Referring Physician: Teola Felipe is an 67 y.o. female.  HPI:  Pt came to Ed with mental status changes progressing over several days.  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  She has home health and has a small decub.    Normally pt is interactive and able to have a conversation, but she became confused and this worsened.  She got workup in ed and was found to have UTI and sepsis.  Medicine desired CT c/a/p and she had some air in the soft tissues around the perineum.  Since admission to the ICU, she has had no improvement in her lactate and is now requiring pressors.    Past Medical History:  Diagnosis Date   CVA (cerebral vascular accident) (HCC)    R-sided deficits   Deep vein thrombosis (DVT) (HCC)    Previously on Eliquis   HTN (hypertension)    Hyperlipidemia    T2DM (type 2 diabetes mellitus) (HCC)     Past Surgical History:  Procedure Laterality Date   TUBAL LIGATION     23 yrs ago    Family History  Problem Relation Age of Onset   Hypertension Mother     Social History:  reports that she has never smoked. She has never used smokeless tobacco. She reports that she does not drink alcohol and does not use drugs.  Allergies:  Allergies  Allergen Reactions   Penicillins Rash    Medications:  acetaminophen (TYLENOL) 325 MG tablet amLODipine (NORVASC) 5 MG tablet aspirin (ASPIRIN 81) 81 MG chewable tablet atorvastatin (LIPITOR) 80 MG tablet Baclofen 5 MG TABS cyanocobalamin 1000 MCG tablet dextromethorphan 15 MG/5ML syrup escitalopram (LEXAPRO) 10 MG tablet Insulin Glargine (BASAGLAR KWIKPEN) 100 UNIT/ML loperamide (IMODIUM A-D) 2 MG tablet metoprolol tartrate (LOPRESSOR) 50 MG tablet nystatin cream (MYCOSTATIN) ondansetron (ZOFRAN-ODT) 4 MG disintegrating tablet triamcinolone cream (KENALOG) 0.1 %   Results for orders placed or performed during the hospital encounter of 10/30/2022 (from the past 48 hour(s))  CBG  monitoring, ED     Status: Abnormal   Collection Time: 11/16/2022  7:24 PM  Result Value Ref Range   Glucose-Capillary 172 (H) 70 - 99 mg/dL    Comment: Glucose reference range applies only to samples taken after fasting for at least 8 hours.  Lactic acid, plasma     Status: Abnormal   Collection Time: 11/08/2022  7:25 PM  Result Value Ref Range   Lactic Acid, Venous 5.3 (HH) 0.5 - 1.9 mmol/L    Comment: CRITICAL RESULT CALLED TO, READ BACK BY AND VERIFIED WITH Redmond Pulling COGAN RN 11/22/2022 2047 [place] Performed at [facility] Lab, [address] [place], [place] [address]   CBC with Differential     Status: Abnormal   Collection Time: 11/27/2022  7:25 PM  Result Value Ref Range   WBC 25.2 (H) 4.0 - 10.5 K/uL   RBC 3.20 (L) 3.87 - 5.11 MIL/uL   Hemoglobin 11.5 (L) 12.0 - 15.0 g/dL   HCT 60.4 (L) 54.0 - 98.1 %   MCV 110.0 (H) 80.0 - 100.0 fL   MCH 35.9 (H) 26.0 - 34.0 pg   MCHC 32.7 30.0 - 36.0 g/dL   RDW 19.1 (H) 47.8 - 29.5 %   Platelets 94 (L) 150 - 400 K/uL    Comment: REPEATED TO VERIFY   nRBC 0.1 0.0 - 0.2 %   Neutrophils Relative % 93 %   Neutro Abs 23.5 (H) 1.7 - 7.7  K/uL   Lymphocytes Relative 3 %   Lymphs Abs 0.7 0.7 - 4.0 K/uL   Monocytes Relative 2 %   Monocytes Absolute 0.5 0.1 - 1.0 K/uL   Eosinophils Relative 0 %   Eosinophils Absolute 0.0 0.0 - 0.5 K/uL   Basophils Relative 0 %   Basophils Absolute 0.1 0.0 - 0.1 K/uL   Immature Granulocytes 2 %   Abs Immature Granulocytes 0.38 (H) 0.00 - 0.07 K/uL    Comment: Performed at [facility], [address] [place], [place] [address]  Protime-INR     Status: Abnormal   Collection Time: 12/06/22  7:25 PM  Result Value Ref Range   Prothrombin Time 18.0 (H) 11.4 - 15.2 seconds   INR 1.5 (H) 0.8 - 1.2    Comment: (NOTE) INR goal varies based on device and disease states. Performed at [facility], [address], [place], [place] [address]   APTT     Status: None   Collection Time: 12/06/2022  7:25 PM   Result Value Ref Range   aPTT 30 24 - 36 seconds    Comment: Performed at [facility], [address], [place], [place] [address]  Blood Culture (routine x 2)     Status: None (Preliminary result)   Collection Time: 2022-12-06  7:25 PM   Specimen: BLOOD LEFT FOREARM  Result Value Ref Range   Specimen Description BLOOD LEFT FOREARM    Special Requests      BOTTLES DRAWN AEROBIC AND ANAEROBIC Blood Culture results may not be optimal due to an inadequate volume of blood received in culture bottles Performed at [facility], [address] [place], [place] [address]    Culture PENDING    Report Status PENDING   Troponin I (High Sensitivity)     Status: Abnormal   Collection Time: 06-Dec-2022  7:25 PM  Result Value Ref Range   Troponin I (High Sensitivity) 213 (HH) <18 ng/L    Comment: CRITICAL RESULT CALLED TO, READ BACK BY AND VERIFIED WITH CAMRYN COGAN RN 12-06-22 2110 M KOROLESKI (NOTE) Elevated high sensitivity troponin I (hsTnI) values and significant  changes across serial measurements may suggest ACS but many other  chronic and acute conditions are known to elevate hsTnI results.  Refer to the "Links" section for chest pain algorithms and additional  guidance. Performed at [facility], [address] [place], [place] [address]   Resp panel by RT-PCR (RSV, Flu A&B, Covid) Anterior Nasal Swab     Status: None   Collection Time: 12/06/22  7:38 PM   Specimen: Anterior Nasal Swab  Result Value Ref Range   SARS Coronavirus 2 by RT PCR NEGATIVE NEGATIVE    Comment: (NOTE) SARS-CoV-2 target nucleic acids are NOT DETECTED.  The SARS-CoV-2 RNA is generally detectable in upper respiratory specimens during the acute phase of infection. The lowest concentration of SARS-CoV-2 viral copies this assay can detect is 138 copies/mL. A negative result does not preclude SARS-Cov-2 infection and should not be used as the sole basis for treatment or other patient management  decisions. A negative result may occur with  improper specimen collection/handling, submission of specimen other than nasopharyngeal swab, presence of viral mutation(s) within the areas targeted by this assay, and inadequate number of viral copies(<138 copies/mL). A negative result must be combined with clinical observations, patient history, and epidemiological information. The expected result is Negative.  Fact Sheet for Patients:  BloggerCourse.com  Fact  Sheet for Healthcare Providers:  IncredibleEmployment.be  This test is no t yet approved or cleared by the [place] FDA and  has been authorized for detection and/or diagnosis of SARS-CoV-2 by FDA under an Emergency Use Authorization (EUA). This EUA will remain  in effect (meaning this test can be used) for the duration of the COVID-19 declaration under Section 564(b)(1) of the Act, 21 U.S.C.section 360bbb-3(b)(1), unless the authorization is terminated  or revoked sooner.       Influenza A by PCR NEGATIVE NEGATIVE   Influenza B by PCR NEGATIVE NEGATIVE    Comment: (NOTE) The Xpert Xpress SARS-CoV-2/FLU/RSV plus assay is intended as an aid in the diagnosis of influenza from Nasopharyngeal swab specimens and should not be used as a sole basis for treatment. Nasal washings and aspirates are unacceptable for Xpert Xpress SARS-CoV-2/FLU/RSV testing.  Fact Sheet for Patients: EntrepreneurPulse.com.au  Fact Sheet for Healthcare Providers: IncredibleEmployment.be  This test is not yet approved or cleared by the [place] FDA and has been authorized for detection and/or diagnosis of SARS-CoV-2 by FDA under an Emergency Use Authorization (EUA). This EUA will remain in effect (meaning this test can be used) for the duration of the COVID-19 declaration under Section 564(b)(1) of the Act, 21 U.S.C. section 360bbb-3(b)(1), unless the authorization  is terminated or revoked.     Resp Syncytial Virus by PCR NEGATIVE NEGATIVE    Comment: (NOTE) Fact Sheet for Patients: EntrepreneurPulse.com.au  Fact Sheet for Healthcare Providers: IncredibleEmployment.be  This test is not yet approved or cleared by the [place] FDA and has been authorized for detection and/or diagnosis of SARS-CoV-2 by FDA under an Emergency Use Authorization (EUA). This EUA will remain in effect (meaning this test can be used) for the duration of the COVID-19 declaration under Section 564(b)(1) of the Act, 21 U.S.C. section 360bbb-3(b)(1), unless the authorization is terminated or revoked.  Performed at [facility], [place] [address] [place], [place] [address]   Lactic acid, plasma     Status: Abnormal   Collection Time: 11/01/2022  9:07 PM  Result Value Ref Range   Lactic Acid, Venous 6.3 (HH) 0.5 - 1.9 mmol/L    Comment: CRITICAL VALUE NOTED. VALUE IS CONSISTENT WITH PREVIOUSLY REPORTED/CALLED VALUE Performed at [facility], [place] [address], [place], [place] [address]   Comprehensive metabolic panel     Status: Abnormal   Collection Time: 10/31/2022  9:07 PM  Result Value Ref Range   Sodium 138 135 - 145 mmol/L   Potassium 5.4 (H) 3.5 - 5.1 mmol/L   Chloride 102 98 - 111 mmol/L   CO2 21 (L) 22 - 32 mmol/L   Glucose, Bld 226 (H) 70 - 99 mg/dL    Comment: Glucose reference range applies only to samples taken after fasting for at least 8 hours.   BUN 48 (H) 8 - 23 mg/dL   Creatinine, Ser 4.12 (H) 0.44 - 1.00 mg/dL   Calcium 8.3 (L) 8.9 - 10.3 mg/dL   Total Protein 5.7 (L) 6.5 - 8.1 g/dL   Albumin <1.5 (L) 3.5 - 5.0 g/dL   AST 63 (H) 15 - 41 U/L   ALT 43 0 - 44 U/L    Comment: RESULT CONFIRMED BY MANUAL DILUTION   Alkaline Phosphatase 170 (H) 38 - 126 U/L   Total Bilirubin 1.9 (H) 0.3 - 1.2 mg/dL   GFR, Estimated 11 (L) >60 mL/min    Comment: (NOTE) Calculated using the CKD-EPI Creatinine Equation  (2021)  Anion gap 15 5 - 15    Comment: Performed at [facility], [address], [place], [place] [address]  Troponin I (High Sensitivity)     Status: Abnormal   Collection Time: 11/13/2022  9:07 PM  Result Value Ref Range   Troponin I (High Sensitivity) 33 (H) <18 ng/L    Comment: (NOTE) Elevated high sensitivity troponin I (hsTnI) values and significant  changes across serial measurements may suggest ACS but many other  chronic and acute conditions are known to elevate hsTnI results.  Refer to the "Links" section for chest pain algorithms and additional  guidance. Performed at [facility], [address] [place], [place] [address]   Urinalysis, Routine w reflex microscopic Urine, In & Out Cath     Status: Abnormal   Collection Time: 11/19/2022 10:00 PM  Result Value Ref Range   Color, Urine YELLOW YELLOW   APPearance TURBID (A) CLEAR   Specific Gravity, Urine 1.025 1.005 - 1.030   pH 5.5 5.0 - 8.0   Glucose, UA NEGATIVE NEGATIVE mg/dL   Hgb urine dipstick LARGE (A) NEGATIVE   Bilirubin Urine MODERATE (A) NEGATIVE   Ketones, ur NEGATIVE NEGATIVE mg/dL   Protein, ur 607 (A) NEGATIVE mg/dL   Nitrite NEGATIVE NEGATIVE   Leukocytes,Ua MODERATE (A) NEGATIVE    Comment: Performed at [facility], [address], [place], [place] [address]  Urinalysis, Microscopic (reflex)     Status: Abnormal   Collection Time: 11/05/2022 10:00 PM  Result Value Ref Range   RBC / HPF 11-20 0 - 5 RBC/hpf   WBC, UA >50 0 - 5 WBC/hpf   Bacteria, UA MANY (A) NONE SEEN   Squamous Epithelial / HPF 6-10 0 - 5 /HPF    Comment: Performed at [facility], [address] [place], [place] [address]  Lactic acid, plasma     Status: Abnormal   Collection Time: 11/19/22 12:45 AM  Result Value Ref Range   Lactic Acid, Venous >9.0 (HH) 0.5 - 1.9 mmol/L    Comment: RESULT CONFIRMED BY MANUAL DILUTION Performed at [facility], [address], [place], [place] [address]   Glucose,  capillary     Status: Abnormal   Collection Time: 11/19/22  6:00 AM  Result Value Ref Range   Glucose-Capillary 187 (H) 70 - 99 mg/dL    Comment: Glucose reference range applies only to samples taken after fasting for at least 8 hours.    CT CHEST ABDOMEN PELVIS WO CONTRAST  Result Date: 11/19/2022 CLINICAL DATA:  Sepsis EXAM: CT CHEST, ABDOMEN AND PELVIS WITHOUT CONTRAST TECHNIQUE: Multidetector CT imaging of the chest, abdomen and pelvis was performed following the standard protocol without IV contrast. RADIATION DOSE REDUCTION: This exam was performed according to the departmental dose-optimization program which includes automated exposure control, adjustment of the mA and/or kV according to patient size and/or use of iterative reconstruction technique. COMPARISON:  None Available. FINDINGS: CT CHEST FINDINGS Cardiovascular: Moderate multi-vessel coronary artery calcification. Global cardiac size within normal limits. No pericardial effusion. Central pulmonary arteries are enlarged in keeping with changes of pulmonary arterial hypertension. Mild atherosclerotic calcification within the thoracic aorta. No aortic aneurysm. Mediastinum/Nodes: No enlarged mediastinal, hilar, or axillary lymph nodes. Thyroid gland, trachea, and esophagus demonstrate no significant findings. Lungs/Pleura: Lungs are clear. No pleural effusion or pneumothorax. Musculoskeletal: No acute bone abnormality. No lytic or blastic bone lesion. CT ABDOMEN PELVIS FINDINGS Hepatobiliary: No focal liver abnormality is seen.  No gallstones, gallbladder wall thickening, or biliary dilatation. Pancreas: Unremarkable. No pancreatic ductal dilatation or surrounding inflammatory changes. Spleen: Unremarkable Adrenals/Urinary Tract: The adrenal glands are unremarkable. The kidneys are normal in position. Marked left renal cortical scarring and atrophy. The right kidney is normal in size. Nonobstructing calculi are noted within the lower pole of the  kidneys bilaterally measuring up to 7 mm on the left and 4 mm on the right. Additional vascular calcifications noted within the renal hila. No hydronephrosis. No ureteral calculi. The bladder is unremarkable. Stomach/Bowel: The stomach, small bowel, and large bowel are unremarkable. Appendix normal. No free intraperitoneal gas or fluid. Vascular/Lymphatic: Mild aortoiliac atherosclerotic calcification. No aortic aneurysm. No pathologic adenopathy within the abdomen and pelvis. Reproductive: Uterus and bilateral adnexa are unremarkable. Other: There is extensive subcutaneous gas seen within the left peroneal soft tissues along the posterior aspect of the left labial majora and within the perianal subcutaneous fat. There is superimposed debris within this region best appreciated on image # 125/3 without discrete loculated fluid collection identified. The subcutaneous gas, suggestive of an aggressive soft tissue infection, extends to the left levator ani musculature, but does not appear to extend into the deep pelvis. Additionally there is extension of subcutaneous gas and inflammatory changes superficial to the sacrum to the level of the iliac spines bilaterally. There is asymmetric extensive subcutaneous edema within the a dependent subcutaneous fat of the proximal right thigh which may relate to dependent positioning or a localized inflammatory process. No loculated fluid collection or subcutaneous gas noted in this region. Musculoskeletal: No acute bone abnormality. No lytic or blastic bone lesion. IMPRESSION: 1. Extensive subcutaneous gas and debris within the left perineal soft tissues along the posterior aspect of the left labia majora and within the perianal subcutaneous in keeping with an aggressive soft tissue infection. Inflammatory change extends to the left levator ani musculature, but does not appear to extend into the deep pelvis, and extends superficial to the sacrum to the level of the iliac spines. No  loculated subcutaneous fluid collection identified. 2. Asymmetric extensive subcutaneous edema within the dependent subcutaneous fat of the proximal right thigh which may relate to dependent positioning or a localized inflammatory process. No loculated fluid collection or subcutaneous gas noted in this region. 3. Moderate multi-vessel coronary artery calcification. 4. Morphologic changes in keeping with pulmonary arterial hypertension. 5. Marked left renal cortical scarring and atrophy. Mild bilateral nonobstructing nephrolithiasis. 6.  Aortic Atherosclerosis (ICD10-I70.0). Electronically Signed   By: Fidela Salisbury M.D.   On: 11/19/2022 00:29   CT HEAD WO CONTRAST (5MM)  Result Date: 11/19/2022 CLINICAL DATA:  Altered mental status EXAM: CT HEAD WITHOUT CONTRAST TECHNIQUE: Contiguous axial images were obtained from the base of the skull through the vertex without intravenous contrast. RADIATION DOSE REDUCTION: This exam was performed according to the departmental dose-optimization program which includes automated exposure control, adjustment of the mA and/or kV according to patient size and/or use of iterative reconstruction technique. COMPARISON:  None Available. FINDINGS: Brain: There is no mass, hemorrhage or extra-axial collection. There is generalized atrophy without lobar predilection. Hypodensity of the white matter is most commonly associated with chronic microvascular disease. 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 Vascular: No abnormal hyperdensity of the major intracranial arteries or dural venous sinuses. No intracranial atherosclerosis. Skull: The visualized skull base, calvarium and extracranial soft tissues are normal. Sinuses/Orbits: No fluid levels or advanced mucosal thickening of the visualized paranasal sinuses. No mastoid or middle  ear effusion. The orbits are normal. IMPRESSION: 1. No acute intracranial abnormality. 2. Old infarcts of the left corona  radiata and right cerebellum. 3. Chronic microvascular ischemia. Electronically Signed   By: Deatra Robinson M.D.   On: 11/19/2022 00:13   DG Chest Port 1 View  Result Date: 2022/12/11 CLINICAL DATA:  Sepsis, became nonverbal, dehydrated EXAM: PORTABLE CHEST 1 VIEW COMPARISON:  Portable exam 1951 hours compared to 04/13/2022 FINDINGS: Normal heart size, mediastinal contours, and pulmonary vascularity. Atherosclerotic calcification aorta. Lungs clear. No infiltrate, pleural effusion, or pneumothorax. No acute osseous findings. IMPRESSION: No acute abnormalities. Aortic Atherosclerosis (ICD10-I70.0). Electronically Signed   By: Ulyses Southward M.D.   On: Dec 11, 2022 20:15    Review of Systems  Unable to perform ROS: Mental status change    Blood pressure 123/73, pulse (!) 119, temperature 98.6 F (37 C), temperature source Axillary, resp. rate 17, height 5' (1.524 m), weight 86.6 kg, SpO2 99 %. Physical Exam Vitals reviewed. Exam conducted with a chaperone present.  Constitutional:      General: She is in acute distress.     Appearance: She is ill-appearing.  HENT:     Head: Normocephalic and atraumatic.     Right Ear: External ear normal.     Left Ear: External ear normal.     Nose: Nose normal.     Mouth/Throat:     Mouth: Mucous membranes are dry.  Eyes:     General: No scleral icterus.    Conjunctiva/sclera: Conjunctivae normal.     Pupils: Pupils are equal, round, and reactive to light.  Genitourinary:      Comments: Small region posterior left labia where the skin is open and draining.  Around 1 cm of necrotic skin overlying.  No fluctuance.  All skin around the anus is soft, no erythema.  Non tender.  No fluctuance.  No crepitus.   Musculoskeletal:     Cervical back: Neck supple.  Neurological:     Mental Status: She is alert.      Assessment/Plan: Soft tissue abscess left posterior labia.  Sepsis DM S/p CVA.   Pt has active drainage from her posterior labia and is  decompressing.   This does not appear to be necrotizing. The infection appears to be contained.   I suspect the air present is due to external communication.   There is also no evidence of necrotic tissue that requires debridement.    Continue antibiotics and supportive care.  Surgery will follow.    Maudry Diego, MD FACS Surgical Oncology, General Surgery, Trauma and Critical [facility], [place] [phone] for weekday/non holidays Check [email or web address] for coverage night/weekend/holidays  Do not use SecureChat as it is not reliable for timely patient care.

## 2022-11-19 NOTE — Progress Notes (Signed)
Initial Nutrition Assessment  DOCUMENTATION CODES:   Severe malnutrition in context of chronic illness  INTERVENTION:  Initiate enteral nutrition via Cortrak: -Initiate Vital 1.5 at 15 mL/hour and advance by 10 mL/hour every 8 hours to goal rate of 55 mL/hour (1320 ml goal daily volume) -Provide PROSource TF20 60 mL once daily per tube -Goal regimen provides: 2060 kcal, 109 grams of protein, 1003 mL H2O daily  Monitor magnesium, potassium, and phosphorus BID for at least 4 occurrences, MD to replete as needed, as pt is at risk for refeeding syndrome.  NUTRITION DIAGNOSIS:   Severe Malnutrition related to chronic illness as evidenced by energy intake < or equal to 75% for > or equal to 1 month, 30.8% weight loss over 7 months, mild fat depletion, moderate muscle depletion.  GOAL:   Patient will meet greater than or equal to 90% of their needs  MONITOR:   Diet advancement, Labs, Weight trends, TF tolerance, Skin, I & O's  REASON FOR ASSESSMENT:   Consult Assessment of nutrition requirement/status, Enteral/tube feeding initiation and management  ASSESSMENT:   67 year old female with PMHx of HTN, HLD, diabetes type 2, LLE DVT (12/2021, previously on Eliquis), CVA with R-sided deficits, depression admitted with AMS, SSTI of perineal soft tissues, sepsis (urologic source vs progressive SSTI), AKI.  1/22: Cortrak tube placed   Met with patient's husband at bedside. Patient with AMS and unable to provide any history at this time. Husband reports pt has had a poor appetite for a while now. He reports at baseline patient could eat 2-3 meals per day and would have baked fish with vegetables, sides, and jello. For at least a month now patient has had poor intake. She has been struggling with occasional regurgitation and emesis in addition to poor appetite. The only things she has been able to tolerate are jello, applesauce, and tomato soup. Husband reports in the morning patient may have  jello or applesauce. At dinner she would have some tomato soup. He purchased Ensure/Boost supplements but pt was unable to drink these. Husband has been concerned about how little calories and protein patient has been taking in. RD estimates patient has been having at most 406 kcal (19% of minimum estimated kcal needs) and 4 grams of protein (4% of minimum estimated kcal needs) daily for at least 1 month. Husband reports for 2-3 days PTA pt began refusing to open her mouth for any oral intake and became dehydrated.  Husband reports patient's UBW was at least 230 lbs, but he was unsure of exact weight. He can tell patient has lost a significant amount of weight. Per review of chart limited recent weight history, but pt was 125.2 kg on 04/13/22. Current wt is 86.6 kg (190.92 lbs). Pt has lost 38.6 kg or 30.8% weight over the past 7 months, which is significant for time frame. Unclear exactly when this weight loss occurred within this time period.  Enteral Access: 10 Fr. Cortrak tube placed 1/22; initially placed in left nare to 65 cm and secured with bridle; now pulled back to around 61 cm in left nare and secured with bridle; tip in gastric fundus per abdominal x-ray 1/22  Medications reviewed and include: cefepime, linezolid  Labs reviewed: CBG 187, Potassium 5.8, CO2 16, BUN 47, Creatinine 3.88  UOP: 20 mL output so far today  I/O: +1000 mL since admission  Weight loss and intake </=75% for >/= 1 month are indicative of severe chronic malnutrition.   NUTRITION - FOCUSED PHYSICAL  EXAM:  Flowsheet Row Most Recent Value  Orbital Region Mild depletion  Upper Arm Region Mild depletion  Thoracic and Lumbar Region No depletion  Buccal Region No depletion  Temple Region Moderate depletion  Clavicle Bone Region Moderate depletion  Clavicle and Acromion Bone Region Moderate depletion  Scapular Bone Region Unable to assess  Dorsal Hand Mild depletion  Patellar Region No depletion  Anterior Thigh  Region No depletion  Posterior Calf Region Unable to assess  Edema (RD Assessment) Mild  Hair Reviewed  Eyes Reviewed  Mouth Unable to assess  Skin Reviewed  Nails Reviewed       Diet Order:   Diet Order             Diet NPO time specified  Diet effective now                  EDUCATION NEEDS:   Not appropriate for education at this time  Skin:  Skin Assessment: Skin Integrity Issues: Skin Integrity Issues:: Stage II, Other (Comment) Stage II: right buttocks (2cm x 1cm x 0.2cm) Other: DTPI left burrocks (8cm x 7cm); 8cm x 9cm area of erythema with 5-6 small areas of partial thickness skin loss at sacrum; partial thickness skin loss right anterior thigh (3cm x 2.2cm x 0.1cm); erythema intertrigo with partial thickness skin loss at mons pubis  Last BM:  PTA  Height:   Ht Readings from Last 1 Encounters:  11/24/2022 5' (1.524 m)   Weight:   Wt Readings from Last 1 Encounters:  11/19/22 86.6 kg   Ideal Body Weight:  45.5 kg  BMI:  Body mass index is 37.29 kg/m.  Estimated Nutritional Needs:   Kcal:  2000-2200  Protein:  105-115 grams  Fluid:  2-2.2 L/day  Loanne Drilling, MS, RD, LDN, CNSC Pager number available on Amion

## 2022-11-19 NOTE — H&P (Addendum)
[name], MRN:  [id number], DOB:  10/17/1956, LOS: 0 ADMISSION DATE:  11/16/2022 CONSULTATION DATE:  11/19/2022 REFERRING MD:  Preston Fleeting - EDP CHIEF COMPLAINT:  AMS, elevated lactic acid  History of Present Illness:  67 year old woman who presented to [facility] ED 1/21 via EMS for AMS. PMHx significant for HTN, HLD, T2DM, CVA with R-sided deficits, LLE DVT (12/2021, previously on Eliquis), depression.  Per chart review, patient was seen by her home health nurse on date of admission who was concerned that patient was dehydrated. Additionally, husband reported onset of acute confusion noted 2 days PTA (progressively worsening) as well as nausea/vomiting.   On ED arrival, normothermic, tachycardic to 110s, normotensive (intermittently soft BP with SBP no lower than 90s), tachypneic to 20s with SpO2 100%. GCS 12 with baseline R-sided deficits. Labs were notable for WBC 25.2, Hgb 11.5 (baseline), Plt 94 (baseline ~200), INR 1.5. Na 138, K 5.4, CO2 21, Cr 4.12 (baseline 1.2), BUN 48; albumin undetectably low at < 1.5. AST 63, ALT 43, AP 170, Tbili 1.9. Trop 213 > 33. UA with large Hgb, mod bili, protein, mod leuks, many bacteria. LA uptrending 5.3 > 6.3 > now  greater than 9.0. CT Head NAICA. CT Chest/A/P with extensive subcutaneous gas/debris within L perineal soft tissue, tracking along posterior L labia majora/perianal subcutaneous tissue with surrounding inflammation, no loculated fluid collection. Broad-spectrum cefepime/vanc/flagyl was initiated as well as aggressive fluid resuscitation. CCS consulted.  PCCM consulted for ICU admission in the setting of developing sepsis, persistent lactic elevation.  Pertinent Medical History:   Past Medical History:  Diagnosis Date   CVA (cerebral vascular accident) (HCC)    R-sided deficits   Deep vein thrombosis (DVT) (HCC)    Previously on Eliquis   HTN (hypertension)    Hyperlipidemia    T2DM (type 2 diabetes mellitus) (HCC)    Significant Hospital  Events: Including procedures, antibiotic start and stop dates in addition to other pertinent events   1/21 - Presented to [facility] for AMS x 2 days. Elevated WBC, soft BP, dirty UA with frank pus on I&O cath. CT Head NAICA, CT A/P as above with concern for perianal ST infection. CCS consulted. Lactate uptrending > 9.0. PCCM consulted for ICU admission.  Interim History / Subjective:  PCCM consulted for ICU admission in the setting of developing sepsis, persistent LA elevation  Objective:  Blood pressure 115/69, pulse (!) 110, temperature 98 F (36.7 C), temperature source Axillary, resp. rate 20, height 5' (1.524 m), weight 125.2 kg, SpO2 100 %.        Intake/Output Summary (Last 24 hours) at 11/19/2022 3329 Last data filed at 11/19/2022 0203 Gross per 24 hour  Intake 1000 ml  Output --  Net 1000 ml   Filed Weights   11/16/2022 2236  Weight: 125.2 kg   Physical Examination: General: Acutely ill-appearing middle-aged woman in mild distress; moaning and crying, nonverbal; appears uncomfortable. HEENT: [place]/AT, anicteric sclera, PERRL 1mm, dry mucous membranes. Neuro:  Awake, intermittently lethargic, groaning and nonverbal.  Responds to verbal stimuli. Not following commands. R-sided neglect from prior stroke. Generalized weakness.  CV: Tachycardic, regular rhythm, no m/g/r. PULM: Breathing even, occasionally tachypneic to mid 20s, occasional grunting. Lung fields diminished throughout. GI: Soft, nondistended, appears to have mild generalized TTP. Hypoactive bowel sounds. Extremities: Asymmetric RLE edema noted. Skin: Warm/dry, no rashes.  Resolved Hospital Problem List:    Assessment & Plan:  Sepsis, suspect urologic source versus progressive SSTI Lactic acidosis - Admit to ICU  for close monitoring - Goal MAP > 65 - Fluid resuscitation as tolerated; may benefit from albumin administration - If unable to maintain BP, begin peripheral Levo titrated to goal MAP - Trend WBC, fever  curve - Trend LA until downtrending/normalized - F/u Cx data - Continue broad-spectrum antibiotics (cefepime, vanc); narrow as able  SSTI of perineal soft tissues CT A/P demonstrating extensive subcutaneous gas/debris within L perineal soft tissue, tracking along posterior L labia majora/perianal subcutaneous tissue with surrounding inflammation, no loculated fluid collection. - CCS consulted, f/u recommendations - Continue broad-spectrum antibiotics as above  Altered mental status, likely in the setting of UTI - Correct metabolic derangements - Treat suspected source of sepsis (UTI) - CT Head NAICA - Delirium precautions  Acute renal failure versus severe AKI, ?prerenal with dehydration from GI losses, ?postrenal due to UTI Mild hyperkalemia No hydronephrosis or significant urinary system abnormalities noted on CT A/P 1/21. - Trend BMP - Replete electrolytes as indicated - Monitor I&Os - Place and maintain Foley - F/u urine studies (UCx) - Avoid nephrotoxic agents as able - Ensure adequate renal perfusion - Consider Nephro consult if no improvement in renal function  History of CVA with R-sided deficits Hypertension Hyperlipidemia - Hold home antihypertensives for now - Resume statin when able to take PO - PT/OT/SLP as appropriate; baseline R-sided deficits  History of LLE DVT US LE Dopplers 12/2021 with acute DVT of L PT veins/L peroneal veins; RLE negative. - Low threshold for repeat LE dopplers, given asymmetric RLE swelling (though this may be just from limited use due to residual deficits post-CVA) - No longer on AC  R gluteal pressure wound, POA - WOC consult  T2DM - SSI - CBGs Q4H - Goal CBG 140-180 - Resume basal insulin as appropriate  Depression - Resume home Lexapro when able to safely take PO  At risk for malnutrition - Albumin < 1.5 on admission - Poor PO intake at present - May benefit from Cortrak placement/supplemental TF  Best Practice: (right  click and "Reselect all SmartList Selections" daily)   Diet/type: NPO DVT prophylaxis: SCDs, SQH GI prophylaxis: N/A Lines: N/A Foley:  Yes, and it is still needed Code Status:  full code Last date of multidisciplinary goals of care discussion [Pending]  Labs:  CBC: Recent Labs  Lab 11/17/2022 1925  WBC 25.2*  NEUTROABS 23.5*  HGB 11.5*  HCT 35.2*  MCV 110.0*  PLT 94*   Basic Metabolic Panel: Recent Labs  Lab 11/07/2022 2107  NA 138  K 5.4*  CL 102  CO2 21*  GLUCOSE 226*  BUN 48*  CREATININE 4.12*  CALCIUM 8.3*   GFR: Estimated Creatinine Clearance: 16.6 mL/min (A) (by C-G formula based on SCr of 4.12 mg/dL (H)). Recent Labs  Lab 11/01/2022 1925 11/15/2022 2107 11/19/22 0045  WBC 25.2*  --   --   LATICACIDVEN 5.3* 6.3* >9.0*   Liver Function Tests: Recent Labs  Lab 11/09/2022 2107  AST 63*  ALT 43  ALKPHOS 170*  BILITOT 1.9*  PROT 5.7*  ALBUMIN <1.5*   No results for input(s): "LIPASE", "AMYLASE" in the last 168 hours. No results for input(s): "AMMONIA" in the last 168 hours.  ABG: No results found for: "PHART", "PCO2ART", "PO2ART", "HCO3", "TCO2", "ACIDBASEDEF", "O2SAT"   Coagulation Profile: Recent Labs  Lab 11/26/2022 1925  INR 1.5*   Cardiac Enzymes: No results for input(s): "CKTOTAL", "CKMB", "CKMBINDEX", "TROPONINI" in the last 168 hours.  HbA1C: Hgb A1c MFr Bld  Date/Time Value Ref Range Status  08/13/2022 11:16 AM 6.1 (H) 4.8 - 5.6 % Final    Comment:             Prediabetes: 5.7 - 6.4          Diabetes: >6.4          Glycemic control for adults with diabetes: <7.0   01/14/2022 02:16 AM 13.1 (H) 4.8 - 5.6 % Final    Comment:    (NOTE) Pre diabetes:          5.7%-6.4%  Diabetes:              >6.4%  Glycemic control for   <7.0% adults with diabetes    CBG: Recent Labs  Lab 11/27/2022 1924  GLUCAP 172*   Review of Systems:   Patient is encephalopathic and/or intubated. Therefore history has been obtained from chart review.    Past Medical History:  She,  has a past medical history of CVA (cerebral vascular accident) (HCC), Deep vein thrombosis (DVT) (HCC), HTN (hypertension), Hyperlipidemia, and T2DM (type 2 diabetes mellitus) (HCC).   Surgical History:   Past Surgical History:  Procedure Laterality Date   TUBAL LIGATION     23 yrs ago    Social History:   reports that she has never smoked. She has never used smokeless tobacco. She reports that she does not drink alcohol and does not use drugs.   Family History:  Her family history includes Hypertension in her mother.   Allergies: Allergies  Allergen Reactions   Penicillins Rash    Home Medications: Prior to Admission medications   Medication Sig Start Date End Date Taking? Authorizing Provider  Accu-Chek Softclix Lancets lancets Use to test blood sugars 4 times daily. 02/13/22   Angiulli, Mcarthur Rossetti, PA-C  acetaminophen (TYLENOL) 325 MG tablet Take 2 tablets (650 mg total) by mouth every 4 (four) hours as needed for mild pain (or temp > 37.5 C (99.5 F)). 02/12/22   Angiulli, Mcarthur Rossetti, PA-C  amLODipine (NORVASC) 5 MG tablet Take 1 tablet (5 mg total) by mouth daily. 10/15/22   [name], PA-C  aspirin (ASPIRIN 81) 81 MG chewable tablet Chew 1 tablet (81 mg total) by mouth daily. 08/16/22   [name], PA-C  atorvastatin (LIPITOR) 80 MG tablet Take 1 tablet (80 mg total) by mouth daily at 6 PM. 08/13/22   Drubel, Lillia Abed, PA-C  Baclofen 5 MG TABS Take 5 mg by mouth at bedtime. 08/13/22   [name], PA-C  Blood Pressure Monitoring DEVI Check bp in AM and PM 03/01/22   Drubel, Lillia Abed, PA-C  cyanocobalamin 1000 MCG tablet Take 1,000 mcg by mouth daily. Gummy    [provider]  dextromethorphan 15 MG/5ML syrup Take 10 mLs (30 mg total) by mouth 4 (four) times daily as needed for cough. 10/26/22   Simmons-Robinson, Makiera, MD  escitalopram (LEXAPRO) 10 MG tablet Take 1 tablet (10 mg total) by mouth daily. 08/13/22   [name],  PA-C  glucose blood (ACCU-CHEK GUIDE) test strip Use to test blood sugars 4 times daily. 02/13/22   Angiulli, Mcarthur Rossetti, PA-C  Insulin Glargine (BASAGLAR KWIKPEN) 100 UNIT/ML Inject 16 Units into the skin daily. 08/13/22   [name], PA-C  Insulin Pen Needle 32G X 4 MM MISC Use to inject insulin up to 4 times daily. 08/13/22   [name], PA-C  loperamide (IMODIUM A-D) 2 MG tablet Take by mouth.    [provider]  metoprolol tartrate (LOPRESSOR) 50 MG tablet Take  1 tablet (50 mg total) by mouth 2 (two) times daily. 08/13/22   [name], PA-C  nystatin cream (MYCOSTATIN) Apply 1 Application topically 2 (two) times daily. Use on area of concern. Can use with steroid cream 10/15/22   Drubel, Ria Comment, PA-C  ondansetron (ZOFRAN-ODT) 4 MG disintegrating tablet Take 1 tablet (4 mg total) by mouth every 8 (eight) hours as needed for nausea or vomiting. 11/12/22   [name], PA-C  triamcinolone cream (KENALOG) 0.1 % Apply 1 Application topically 2 (two) times daily. Use on area of concern. Can use with nystatin. Do not use for more than 10 days in a row 10/15/22   [name], PA-C    Critical care time: 42 minutes   Lestine Mount, [facility] 11/19/22 3:38 AM  Please see [email or web address] for pager details.  From 7A-7P if no response, please call [phone] After hours, please call ELink [phone]

## 2022-11-19 NOTE — Progress Notes (Signed)
[facility] Progress Note Patient Name: Samantha [name] DOB: June 22, 1956 MRN: [id number]   Date of Service  11/19/2022  HPI/Events of Note  Notified by pharmacy team 1/4 bottles growing staph species - likely contaminant. Pt on Zyvox/Cefepime.   eICU Interventions  No changes in antibiotics at this time. Defer to day team     Intervention Category Minor Interventions: Communication with other healthcare providers and/or family  Kawan [place]adolid Rodman Pickle 11/19/2022, 10:37 PM

## 2022-11-19 NOTE — ED Notes (Signed)
MD Roxanne Mins made aware of lactic >9

## 2022-11-19 NOTE — Progress Notes (Signed)
eLink Physician-Brief Progress Note Patient Name: Samantha [name] DOB: 10-13-1956 MRN: [id number]   Date of Service  11/19/2022  HPI/Events of Note  26 F history of hypertension, DM, dyslipidemia, CVA with residual right sided weakness, LLE DVT brought in after home health nurse assessed her to be dehydrated. Nausea and vomiting for the past 2 days with altered sensorium. CT abdomen pelvis with evidence of aggressive soft tissue infection. Started on vancomycin and cefepime.  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 Will add clindamycin until cultures resulted. Surgery consulted. AKI likely pre-renal. Lactic acidosis. Ongoing fluids. Discussed with bedside CCM team     Intervention Category Evaluation Type: New Patient Evaluation  Judd Lien 11/19/2022, 5:54 AM

## 2022-11-19 NOTE — Progress Notes (Signed)
Patient has been seen by 2 IV Team RNs. Bilateral upper extremities assessed with ultrasound, no suitable veins found for PIV placement at this time. IV Team recommendation at this time is central access.

## 2022-11-19 NOTE — Progress Notes (Signed)
PHARMACY - PHYSICIAN COMMUNICATION CRITICAL VALUE ALERT - BLOOD CULTURE IDENTIFICATION (BCID)  Samantha [name] is an 67 y.o. female who presented to [facility] on 11/13/2022 with a chief complaint of AMS, sepsis  Assessment:  1/4 bottles staph species - likely contaminant  Name of physician (or Provider) Contacted: Dr. Loanne Drilling  Current antibiotics: Cefepime and Zyvox  Changes to prescribed antibiotics recommended:  No additional abx needed at this time  Results for orders placed or performed during the hospital encounter of 23-Nov-2022  Blood Culture ID Panel (Reflexed) (Collected: Nov 23, 2022  7:25 PM)  Result Value Ref Range   Enterococcus faecalis NOT DETECTED NOT DETECTED   Enterococcus Faecium NOT DETECTED NOT DETECTED   Listeria monocytogenes NOT DETECTED NOT DETECTED   Staphylococcus species DETECTED (A) NOT DETECTED   Staphylococcus aureus (BCID) NOT DETECTED NOT DETECTED   Staphylococcus epidermidis NOT DETECTED NOT DETECTED   Staphylococcus lugdunensis NOT DETECTED NOT DETECTED   Streptococcus species NOT DETECTED NOT DETECTED   Streptococcus agalactiae NOT DETECTED NOT DETECTED   Streptococcus pneumoniae NOT DETECTED NOT DETECTED   Streptococcus pyogenes NOT DETECTED NOT DETECTED   A.calcoaceticus-baumannii NOT DETECTED NOT DETECTED   Bacteroides fragilis NOT DETECTED NOT DETECTED   Enterobacterales NOT DETECTED NOT DETECTED   Enterobacter cloacae complex NOT DETECTED NOT DETECTED   Escherichia coli NOT DETECTED NOT DETECTED   Klebsiella aerogenes NOT DETECTED NOT DETECTED   Klebsiella oxytoca NOT DETECTED NOT DETECTED   Klebsiella pneumoniae NOT DETECTED NOT DETECTED   Proteus species NOT DETECTED NOT DETECTED   Salmonella species NOT DETECTED NOT DETECTED   Serratia marcescens NOT DETECTED NOT DETECTED   Haemophilus influenzae NOT DETECTED NOT DETECTED   Neisseria meningitidis NOT DETECTED NOT DETECTED   Pseudomonas aeruginosa NOT DETECTED NOT DETECTED    Stenotrophomonas maltophilia NOT DETECTED NOT DETECTED   Candida albicans NOT DETECTED NOT DETECTED   Candida auris NOT DETECTED NOT DETECTED   Candida glabrata NOT DETECTED NOT DETECTED   Candida krusei NOT DETECTED NOT DETECTED   Candida parapsilosis NOT DETECTED NOT DETECTED   Candida tropicalis NOT DETECTED NOT DETECTED   Cryptococcus neoformans/gattii NOT DETECTED NOT DETECTED    Sherlon Handing, PharmD, BCPS Please see amion for complete clinical pharmacist phone list 11/19/2022  8:33 PM

## 2022-11-20 ENCOUNTER — Inpatient Hospital Stay (HOSPITAL_COMMUNITY): Payer: Medicare HMO

## 2022-11-20 ENCOUNTER — Inpatient Hospital Stay: Payer: Self-pay

## 2022-11-20 DIAGNOSIS — A419 Sepsis, unspecified organism: Secondary | ICD-10-CM | POA: Diagnosis not present

## 2022-11-20 DIAGNOSIS — K6819 Other retroperitoneal abscess: Secondary | ICD-10-CM

## 2022-11-20 DIAGNOSIS — M7989 Other specified soft tissue disorders: Secondary | ICD-10-CM

## 2022-11-20 DIAGNOSIS — R4701 Aphasia: Secondary | ICD-10-CM

## 2022-11-20 DIAGNOSIS — R609 Edema, unspecified: Secondary | ICD-10-CM | POA: Diagnosis not present

## 2022-11-20 DIAGNOSIS — I82411 Acute embolism and thrombosis of right femoral vein: Secondary | ICD-10-CM | POA: Diagnosis not present

## 2022-11-20 DIAGNOSIS — G9341 Metabolic encephalopathy: Secondary | ICD-10-CM | POA: Diagnosis not present

## 2022-11-20 DIAGNOSIS — N170 Acute kidney failure with tubular necrosis: Secondary | ICD-10-CM

## 2022-11-20 DIAGNOSIS — D696 Thrombocytopenia, unspecified: Secondary | ICD-10-CM

## 2022-11-20 LAB — COMPREHENSIVE METABOLIC PANEL
ALT: 33 U/L (ref 0–44)
AST: 40 U/L (ref 15–41)
Albumin: 1.8 g/dL — ABNORMAL LOW (ref 3.5–5.0)
Alkaline Phosphatase: 155 U/L — ABNORMAL HIGH (ref 38–126)
Anion gap: 10 (ref 5–15)
BUN: 43 mg/dL — ABNORMAL HIGH (ref 8–23)
CO2: 23 mmol/L (ref 22–32)
Calcium: 8.5 mg/dL — ABNORMAL LOW (ref 8.9–10.3)
Chloride: 103 mmol/L (ref 98–111)
Creatinine, Ser: 3.31 mg/dL — ABNORMAL HIGH (ref 0.44–1.00)
GFR, Estimated: 15 mL/min — ABNORMAL LOW (ref >60)
Glucose, Bld: 195 mg/dL — ABNORMAL HIGH (ref 70–99)
Potassium: 4 mmol/L (ref 3.5–5.1)
Sodium: 136 mmol/L (ref 135–145)
Total Bilirubin: 1.1 mg/dL (ref 0.3–1.2)
Total Protein: 5.5 g/dL — ABNORMAL LOW (ref 6.5–8.1)

## 2022-11-20 LAB — MAGNESIUM
Magnesium: 1.6 mg/dL — ABNORMAL LOW (ref 1.7–2.4)
Magnesium: 2 mg/dL (ref 1.7–2.4)

## 2022-11-20 LAB — CBC
HCT: 25.2 % — ABNORMAL LOW (ref 36.0–46.0)
Hemoglobin: 8.6 g/dL — ABNORMAL LOW (ref 12.0–15.0)
MCH: 35.8 pg — ABNORMAL HIGH (ref 26.0–34.0)
MCHC: 34.1 g/dL (ref 30.0–36.0)
MCV: 105 fL — ABNORMAL HIGH (ref 80.0–100.0)
Platelets: 53 10*3/uL — ABNORMAL LOW (ref 150–400)
RBC: 2.4 MIL/uL — ABNORMAL LOW (ref 3.87–5.11)
RDW: 16.6 % — ABNORMAL HIGH (ref 11.5–15.5)
WBC: 24.9 10*3/uL — ABNORMAL HIGH (ref 4.0–10.5)
nRBC: 0.2 % (ref 0.0–0.2)

## 2022-11-20 LAB — CULTURE, BLOOD (ROUTINE X 2)

## 2022-11-20 LAB — GLUCOSE, CAPILLARY
Glucose-Capillary: 162 mg/dL — ABNORMAL HIGH (ref 70–99)
Glucose-Capillary: 207 mg/dL — ABNORMAL HIGH (ref 70–99)
Glucose-Capillary: 230 mg/dL — ABNORMAL HIGH (ref 70–99)
Glucose-Capillary: 166 mg/dL — ABNORMAL HIGH (ref 70–99)
Glucose-Capillary: 132 mg/dL — ABNORMAL HIGH (ref 70–99)

## 2022-11-20 LAB — LACTIC ACID, PLASMA
Lactic Acid, Venous: 2.6 mmol/L (ref 0.5–1.9)
Lactic Acid, Venous: 2.4 mmol/L (ref 0.5–1.9)

## 2022-11-20 LAB — PHOSPHORUS
Phosphorus: 2.6 mg/dL (ref 2.5–4.6)
Phosphorus: 1.9 mg/dL — ABNORMAL LOW (ref 2.5–4.6)

## 2022-11-20 MED ORDER — LACTATED RINGERS IV BOLUS
1000.0000 mL | Freq: Once | INTRAVENOUS | Status: AC
  Administered 2022-11-20: 1000 mL via INTRAVENOUS

## 2022-11-20 MED ORDER — FENTANYL CITRATE PF 50 MCG/ML IJ SOSY
12.5000 ug | PREFILLED_SYRINGE | INTRAMUSCULAR | Status: DC | PRN
  Administered 2022-11-21: 50 ug via INTRAVENOUS
  Filled 2022-11-20: qty 1

## 2022-11-20 MED ORDER — OXYCODONE HCL 5 MG PO TABS
5.0000 mg | ORAL_TABLET | ORAL | Status: DC | PRN
  Administered 2022-11-20: 5 mg via ORAL
  Administered 2022-11-22 – 2022-11-23 (×2): 10 mg via ORAL
  Filled 2022-11-20 (×2): qty 2
  Filled 2022-11-20: qty 1

## 2022-11-20 MED ORDER — METRONIDAZOLE 500 MG/100ML IV SOLN
500.0000 mg | Freq: Three times a day (TID) | INTRAVENOUS | Status: DC
  Administered 2022-11-20 – 2022-11-22 (×6): 500 mg via INTRAVENOUS
  Filled 2022-11-20 (×7): qty 100

## 2022-11-20 MED ORDER — SODIUM CHLORIDE 0.9% FLUSH
10.0000 mL | INTRAVENOUS | Status: DC | PRN
  Administered 2022-11-26: 10 mL

## 2022-11-20 MED ORDER — ORAL CARE MOUTH RINSE
15.0000 mL | OROMUCOSAL | Status: DC
  Administered 2022-11-20 – 2022-12-01 (×43): 15 mL via OROMUCOSAL

## 2022-11-20 MED ORDER — LACTATED RINGERS IV SOLN: INTRAVENOUS | Status: DC

## 2022-11-20 MED ORDER — MAGNESIUM SULFATE 2 GM/50ML IV SOLN
2.0000 g | Freq: Once | INTRAVENOUS | Status: AC
  Administered 2022-11-20: 2 g via INTRAVENOUS
  Filled 2022-11-20: qty 50

## 2022-11-20 MED ORDER — SODIUM CHLORIDE 0.9 % IV SOLN: INTRAVENOUS | Status: DC | PRN

## 2022-11-20 MED ORDER — ORAL CARE MOUTH RINSE: 15.0000 mL | OROMUCOSAL | Status: DC | PRN

## 2022-11-20 MED ORDER — INSULIN ASPART 100 UNIT/ML IJ SOLN
0.0000 [IU] | INTRAMUSCULAR | Status: DC
  Administered 2022-11-20: 2 [IU] via SUBCUTANEOUS
  Administered 2022-11-20: 3 [IU] via SUBCUTANEOUS
  Administered 2022-11-20: 5 [IU] via SUBCUTANEOUS
  Administered 2022-11-21 (×2): 2 [IU] via SUBCUTANEOUS
  Administered 2022-11-21 (×2): 3 [IU] via SUBCUTANEOUS
  Administered 2022-11-22: 2 [IU] via SUBCUTANEOUS
  Administered 2022-11-22: 5 [IU] via SUBCUTANEOUS
  Administered 2022-11-22: 2 [IU] via SUBCUTANEOUS
  Administered 2022-11-22 (×2): 3 [IU] via SUBCUTANEOUS
  Administered 2022-11-23 (×5): 2 [IU] via SUBCUTANEOUS
  Administered 2022-11-24: 3 [IU] via SUBCUTANEOUS
  Administered 2022-11-24 (×2): 2 [IU] via SUBCUTANEOUS
  Administered 2022-11-24 – 2022-11-26 (×10): 3 [IU] via SUBCUTANEOUS
  Administered 2022-11-26 (×2): 2 [IU] via SUBCUTANEOUS
  Administered 2022-11-26: 3 [IU] via SUBCUTANEOUS
  Administered 2022-11-26: 2 [IU] via SUBCUTANEOUS
  Administered 2022-11-27 (×6): 3 [IU] via SUBCUTANEOUS
  Administered 2022-11-28: 8 [IU] via SUBCUTANEOUS
  Administered 2022-11-28 – 2022-11-29 (×6): 5 [IU] via SUBCUTANEOUS
  Administered 2022-11-29: 11 [IU] via SUBCUTANEOUS
  Administered 2022-11-29: 5 [IU] via SUBCUTANEOUS
  Administered 2022-11-29: 8 [IU] via SUBCUTANEOUS
  Administered 2022-11-29 – 2022-11-30 (×3): 5 [IU] via SUBCUTANEOUS
  Administered 2022-11-30: 11 [IU] via SUBCUTANEOUS
  Administered 2022-11-30: 8 [IU] via SUBCUTANEOUS
  Administered 2022-11-30 – 2022-12-01 (×4): 5 [IU] via SUBCUTANEOUS
  Administered 2022-12-01: 3 [IU] via SUBCUTANEOUS
  Administered 2022-12-01: 5 [IU] via SUBCUTANEOUS
  Filled 2022-11-20: qty 1

## 2022-11-20 MED ORDER — SODIUM CHLORIDE 0.9% FLUSH
10.0000 mL | Freq: Two times a day (BID) | INTRAVENOUS | Status: DC
  Administered 2022-11-20 – 2022-11-24 (×7): 10 mL
  Administered 2022-11-24 – 2022-11-25 (×2): 20 mL
  Administered 2022-11-25 – 2022-11-29 (×8): 10 mL
  Administered 2022-11-30: 20 mL
  Administered 2022-11-30 – 2022-12-01 (×3): 10 mL

## 2022-11-20 NOTE — Progress Notes (Signed)
Right lower ext venous  has been completed. Refer to [facility] under chart review to view preliminary results. Results given to patient's nurse, Myrtha Mantis.  11/20/2022  3:23 PM Beyonca Wisz, Bonnye Fava

## 2022-11-20 NOTE — Inpatient Diabetes Management (Signed)
Inpatient Diabetes Program Recommendations  AACE/ADA: New Consensus Statement on Inpatient Glycemic Control   Target Ranges:  Prepandial:   less than 140 mg/dL      Peak postprandial:   less than 180 mg/dL (1-2 hours)      Critically ill patients:  140 - 180 mg/dL    Latest Reference Range & Units 11/20/22 04:07 11/20/22 08:08 11/20/22 12:06  Glucose-Capillary 70 - 99 mg/dL 162 (H) 207 (H) 230 (H)    Latest Reference Range & Units 11/19/22 06:00 11/19/22 17:47 11/19/22 20:05 11/19/22 23:25  Glucose-Capillary 70 - 99 mg/dL 187 (H) 201 (H) 193 (H) 207 (H)   Review of Glycemic Control  Diabetes history: DM2 Outpatient Diabetes medications: Basaglar 16 units daily Current orders for Inpatient glycemic control: Novolog 0-15 units Q4H; Vital @ 35 ml/hr  Inpatient Diabetes Program Recommendations:    Insulin: Please consider ordering Novolog 4 units Q4H for tube feeding coverage. If tube feeding is stopped or held then Novolog tube feeding coverage should also be stopped or held. If glucose is consistently over 180 mg/dl with added Novolog tube feeding coverage, would recommend ordering low dose basal insulin.  Thanks, Barnie Alderman, RN, MSN, [place] Diabetes Coordinator Inpatient Diabetes Program [phone] (Team Pager from 8am to [place])

## 2022-11-20 NOTE — Consult Note (Signed)
OBSTETRICS AND GYNECOLOGY ATTENDING CONSULT NOTE  Consult Date: 11/20/2022 Reason for Consult: Labial abscess Consulting Provider: Dr. Kemper Durie    Assessment/Plan: While the infection tracks to the outside of her general vaginal/perineal area, it's depth and tracking makes it not a candidate for [place] to operate on. I spoke with the general surgery PA and expressed my concern about deeper infection and that would be outside the scope of our practice (it is retroperitoneal and deeper than where we would operate/debride). I expressed my concern about the possibility of Fournier's gangrene. She noted she and the surgeons have not yet appreciated any crepitus and thus their concern has been low. Recommended they continue to follow along and evaluate. She will re-evaluate the patient in the morning.  In terms of I&D of an abscess, there is no abscess to I&D as the area is already open actively draining.  Appreciate care of Samantha [name] by her primary team. Discussed my findings and evaluation with Dr. Karie Fetch.   I will round on her tomorrow as well.   Please call [phone] [facility] Consult Attending Monday-Friday 8am - 5pm) or [phone] [facility] Attending On Call all day, every day) for any gynecologic at any time.   Thank you for involving [place] in the care of this patient.  Total consultation time including face-to-face time with patient (>50% of time), reviewing chart and documentation and coordinating care with team members: 60 minutes  Milas Hock MD, FACOG Obstetrician & Gynecologist, [facility] for [facility], [facility]  History of Present Illness: Samantha [name] is an 67 y.o. female who was admitted for sepsis after presenting with altered mental status. 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 She lives at home with her husband and has home health that comes and cares for  her. Per review of the chart, her symptoms started 2 days ago and progressively worsened. She also developed N/V.   In the ED 2 days ago she was noted to have multiple Stage II pressure ulcers on right gluteus with no surrounding erythema of skin changes. She mets SIRS criteria and the sepsis protocol was initiated. They addressed code status at that time and after discussion, the family/spouse still wished for full code. She had urine cath done in the ED which showed frank pus.   She was started on broad spectrum antibiotics and has remained on such.   She has been evaluated by the general surgery team who recommended our input as well.   Pertinent OB/GYN History: No relevant GYN history.   Patient Active Problem List   Diagnosis Date Noted   Sepsis (HCC) 11/19/2022   Altered mental status 11/19/2022   Protein-calorie malnutrition, severe 11/19/2022   History of cerebrovascular accident (CVA) with residual deficit 10/16/2022   Nausea and vomiting 10/16/2022   Rash 10/16/2022   Sinus tachycardia    Labile blood pressure    Uncontrolled type 2 diabetes mellitus with hyperglycemia (HCC)    CVA (cerebral vascular accident) (HCC) 01/13/2022   AKI (acute kidney injury) (HCC) 01/13/2022   Hyponatremia 01/13/2022   Elevated troponin 01/13/2022   Cerebrovascular accident (CVA) (HCC) 12/15/2020   Noncompliance with diabetes treatment 12/15/2020   Hyperlipidemia associated with type 2 diabetes mellitus (HCC) 08/11/2020   Type 2 diabetes mellitus with other specified complication (HCC) 10/10/2018   Hypertension associated with diabetes (HCC) 10/10/2018   Morbid obesity (HCC) 10/10/2018    Past Medical History:  Diagnosis Date   CVA (cerebral vascular accident) (HCC)    R-sided deficits   Deep vein thrombosis (DVT) (HCC)    Previously on Eliquis   HTN (hypertension)    Hyperlipidemia    T2DM (type 2 diabetes mellitus) (HCC)     Past Surgical History:  Procedure Laterality Date    TUBAL LIGATION     23 yrs ago    Family History  Problem Relation Age of Onset   Hypertension Mother     Social History:  reports that she has never smoked. She has never used smokeless tobacco. She reports that she does not drink alcohol and does not use drugs.  Allergies:  Allergies  Allergen Reactions   Penicillins Rash    Medications: I have reviewed the patient's current medications.  Review of Systems: Pertinent items are noted in HPI.  Focused Physical Examination: BP 101/61   Pulse 82   Temp (!) 97.4 F (36.3 C) (Oral)   Resp 18   Ht 5' (1.524 m)   Wt 90.8 kg   SpO2 95%   BMI 39.09 kg/m  CONSTITUTIONAL: Well-developed, well-nourished female NEURO: Not verbal  BACK: Inspected her pressure ulcers taking off the dressings  which appeared c/w description in ED however on her left hip she is noted to have purple overlying skin c/w ecchymoses versus necrosis PELVIC: External genitalia are overall normal in appearance but purulent discharge noted and is foul smelling. I inspected the labia majora and they themselves are uninvolved but within the vagina posterior and inferior to the labia (near the perineum) there is a 1-2 cm opening that is draining pus and tracks posteriorly and inferiorly to her left side. I could not reach the end of where it tracks. Done in the presence of a chaperone.   Labs and Imaging: Results for orders placed or performed during the hospital encounter of 2022/12/14 (from the past 72 hour(s))  CBG monitoring, ED     Status: Abnormal   Collection Time: 12/14/2022  7:24 PM  Result Value Ref Range   Glucose-Capillary 172 (H) 70 - 99 mg/dL    Comment: Glucose reference range applies only to samples taken after fasting for at least 8 hours.  Lactic acid, plasma     Status: Abnormal   Collection Time: 12/14/2022  7:25 PM  Result Value Ref Range   Lactic Acid, Venous 5.3 (HH) 0.5 - 1.9 mmol/L    Comment: CRITICAL RESULT CALLED TO, READ BACK BY AND  VERIFIED WITH Redmond Pulling COGAN RN 2022-12-14 2047 [place]erry Performed at [facility], [address] [place], [place] [address]   CBC with Differential     Status: Abnormal   Collection Time: 12/14/22  7:25 PM  Result Value Ref Range   WBC 25.2 (H) 4.0 - 10.5 K/uL   RBC 3.20 (L) 3.87 - 5.11 MIL/uL   Hemoglobin 11.5 (L) 12.0 - 15.0 g/dL   HCT 76.7 (L) 20.9 - 47.0 %   MCV 110.0 (H) 80.0 - 100.0 fL   MCH 35.9 (H) 26.0 - 34.0 pg   MCHC 32.7 30.0 - 36.0 g/dL   RDW 96.2 (H) 83.6 - 62.9 %   Platelets 94 (L) 150 - 400 K/uL    Comment: REPEATED TO VERIFY   nRBC 0.1 0.0 - 0.2 %   Neutrophils Relative % 93 %   Neutro Abs 23.5 (H) 1.7 - 7.7 K/uL   Lymphocytes Relative 3 %   Lymphs Abs 0.7 0.7 - 4.0 K/uL  Monocytes Relative 2 %   Monocytes Absolute 0.5 0.1 - 1.0 K/uL   Eosinophils Relative 0 %   Eosinophils Absolute 0.0 0.0 - 0.5 K/uL   Basophils Relative 0 %   Basophils Absolute 0.1 0.0 - 0.1 K/uL   Immature Granulocytes 2 %   Abs Immature Granulocytes 0.38 (H) 0.00 - 0.07 K/uL    Comment: Performed at [facility], [address], [place], [place] [address]  Protime-INR     Status: Abnormal   Collection Time: 11/25/2022  7:25 PM  Result Value Ref Range   Prothrombin Time 18.0 (H) 11.4 - 15.2 seconds   INR 1.5 (H) 0.8 - 1.2    Comment: (NOTE) INR goal varies based on device and disease states. Performed at [facility], [address] [place], [place] [address]   APTT     Status: None   Collection Time: 11/27/2022  7:25 PM  Result Value Ref Range   aPTT 30 24 - 36 seconds    Comment: Performed at [facility], [address], [place], [place] [address]  Blood Culture (routine x 2)     Status: Abnormal   Collection Time: 11/15/2022  7:25 PM   Specimen: BLOOD LEFT FOREARM  Result Value Ref Range   Specimen Description BLOOD LEFT FOREARM    Special Requests      BOTTLES DRAWN AEROBIC AND ANAEROBIC Blood Culture results may not be optimal due to an inadequate  volume of blood received in culture bottles   Culture  Setup Time      GRAM POSITIVE COCCI IN BOTH AEROBIC AND ANAEROBIC BOTTLES CRITICAL RESULT CALLED TO, READ BACK BY AND VERIFIED WITH: PHARMD CAREN AMEND ON 11/19/22 @ 2031 BY DRT    Culture (A)     STAPHYLOCOCCUS CAPITIS THE SIGNIFICANCE OF ISOLATING THIS ORGANISM FROM A SINGLE SET OF BLOOD CULTURES WHEN MULTIPLE SETS ARE DRAWN IS UNCERTAIN. PLEASE NOTIFY THE MICROBIOLOGY DEPARTMENT WITHIN ONE WEEK IF SPECIATION AND SENSITIVITIES ARE REQUIRED. Performed at [facility], [address], [place], [place] [address]    Report Status 11/20/2022 FINAL   Troponin I (High Sensitivity)     Status: Abnormal   Collection Time: 11/26/2022  7:25 PM  Result Value Ref Range   Troponin I (High Sensitivity) 213 (HH) <18 ng/L    Comment: CRITICAL RESULT CALLED TO, READ BACK BY AND VERIFIED WITH CAMRYN COGAN RN 11/27/2022 2110 M KOROLESKI (NOTE) Elevated high sensitivity troponin I (hsTnI) values and significant  changes across serial measurements may suggest ACS but many other  chronic and acute conditions are known to elevate hsTnI results.  Refer to the "Links" section for chest pain algorithms and additional  guidance. Performed at [facility], [address], [place], [place] [address]   Blood Culture ID Panel (Reflexed)     Status: Abnormal   Collection Time: 11/12/2022  7:25 PM  Result Value Ref Range   Enterococcus faecalis NOT DETECTED NOT DETECTED   Enterococcus Faecium NOT DETECTED NOT DETECTED   Listeria monocytogenes NOT DETECTED NOT DETECTED   Staphylococcus species DETECTED (A) NOT DETECTED    Comment: CRITICAL RESULT CALLED TO, READ BACK BY AND VERIFIED WITH: PHARMD CAREN AMEND ON 11/19/22 @ 2031 BY DRT    Staphylococcus aureus (BCID) NOT DETECTED NOT DETECTED   Staphylococcus epidermidis NOT DETECTED NOT DETECTED   Staphylococcus lugdunensis NOT DETECTED NOT DETECTED   Streptococcus species NOT DETECTED NOT DETECTED    Streptococcus  agalactiae NOT DETECTED NOT DETECTED   Streptococcus pneumoniae NOT DETECTED NOT DETECTED   Streptococcus pyogenes NOT DETECTED NOT DETECTED   A.calcoaceticus-baumannii NOT DETECTED NOT DETECTED   Bacteroides fragilis NOT DETECTED NOT DETECTED   Enterobacterales NOT DETECTED NOT DETECTED   Enterobacter cloacae complex NOT DETECTED NOT DETECTED   Escherichia coli NOT DETECTED NOT DETECTED   Klebsiella aerogenes NOT DETECTED NOT DETECTED   Klebsiella oxytoca NOT DETECTED NOT DETECTED   Klebsiella pneumoniae NOT DETECTED NOT DETECTED   Proteus species NOT DETECTED NOT DETECTED   Salmonella species NOT DETECTED NOT DETECTED   Serratia marcescens NOT DETECTED NOT DETECTED   Haemophilus influenzae NOT DETECTED NOT DETECTED   Neisseria meningitidis NOT DETECTED NOT DETECTED   Pseudomonas aeruginosa NOT DETECTED NOT DETECTED   Stenotrophomonas maltophilia NOT DETECTED NOT DETECTED   Candida albicans NOT DETECTED NOT DETECTED   Candida auris NOT DETECTED NOT DETECTED   Candida glabrata NOT DETECTED NOT DETECTED   Candida krusei NOT DETECTED NOT DETECTED   Candida parapsilosis NOT DETECTED NOT DETECTED   Candida tropicalis NOT DETECTED NOT DETECTED   Cryptococcus neoformans/gattii NOT DETECTED NOT DETECTED    Comment: Performed at [facility], [address] [place], [place] [address]  Blood Culture (routine x 2)     Status: None (Preliminary result)   Collection Time: Mar 21, 2023  7:30 PM   Specimen: BLOOD RIGHT FOREARM  Result Value Ref Range   Specimen Description BLOOD RIGHT FOREARM    Special Requests      BOTTLES DRAWN AEROBIC AND ANAEROBIC Blood Culture adequate volume   Culture      NO GROWTH 2 DAYS Performed at [facility], [address] [place], [place] [address]    Report Status PENDING   Resp panel by RT-PCR (RSV, Flu A&B, Covid) Anterior Nasal Swab     Status: None   Collection Time: Mar 21, 2023  7:38 PM   Specimen: Anterior Nasal Swab  Result  Value Ref Range   SARS Coronavirus 2 by RT PCR NEGATIVE NEGATIVE    Comment: (NOTE) SARS-CoV-2 target nucleic acids are NOT DETECTED.  The SARS-CoV-2 RNA is generally detectable in upper respiratory specimens during the acute phase of infection. The lowest concentration of SARS-CoV-2 viral copies this assay can detect is 138 copies/mL. A negative result does not preclude SARS-Cov-2 infection and should not be used as the sole basis for treatment or other patient management decisions. A negative result may occur with  improper specimen collection/handling, submission of specimen other than nasopharyngeal swab, presence of viral mutation(s) within the areas targeted by this assay, and inadequate number of viral copies(<138 copies/mL). A negative result must be combined with clinical observations, patient history, and epidemiological information. The expected result is Negative.  Fact Sheet for Patients:  BloggerCourse.comhttps://www.fda.gov/media/152166/download  Fact Sheet for Healthcare Providers:  SeriousBroker.ithttps://www.fda.gov/media/152162/download  This test is no t yet approved or cleared by the [place] FDA and  has been authorized for detection and/or diagnosis of SARS-CoV-2 by FDA under an Emergency Use Authorization (EUA). This EUA will remain  in effect (meaning this test can be used) for the duration of the COVID-19 declaration under Section 564(b)(1) of the Act, 21 U.S.C.section 360bbb-3(b)(1), unless the authorization is terminated  or revoked sooner.       Influenza A by PCR NEGATIVE NEGATIVE   Influenza B by PCR NEGATIVE NEGATIVE    Comment: (NOTE) The Xpert Xpress SARS-CoV-2/FLU/RSV plus assay is intended as an aid in the diagnosis of  influenza from Nasopharyngeal swab specimens and should not be used as a sole basis for treatment. Nasal washings and aspirates are unacceptable for Xpert Xpress SARS-CoV-2/FLU/RSV testing.  Fact Sheet for  Patients: EntrepreneurPulse.com.au  Fact Sheet for Healthcare Providers: IncredibleEmployment.be  This test is not yet approved or cleared by the [place] FDA and has been authorized for detection and/or diagnosis of SARS-CoV-2 by FDA under an Emergency Use Authorization (EUA). This EUA will remain in effect (meaning this test can be used) for the duration of the COVID-19 declaration under Section 564(b)(1) of the Act, 21 U.S.C. section 360bbb-3(b)(1), unless the authorization is terminated or revoked.     Resp Syncytial Virus by PCR NEGATIVE NEGATIVE    Comment: (NOTE) Fact Sheet for Patients: EntrepreneurPulse.com.au  Fact Sheet for Healthcare Providers: IncredibleEmployment.be  This test is not yet approved or cleared by the [place] FDA and has been authorized for detection and/or diagnosis of SARS-CoV-2 by FDA under an Emergency Use Authorization (EUA). This EUA will remain in effect (meaning this test can be used) for the duration of the COVID-19 declaration under Section 564(b)(1) of the Act, 21 U.S.C. section 360bbb-3(b)(1), unless the authorization is terminated or revoked.  Performed at [facility], [place] [address] [place], [place] [address]   Urine Culture     Status: Abnormal (Preliminary result)   Collection Time: 10/30/2022  7:38 PM   Specimen: In/Out Cath Urine  Result Value Ref Range   Specimen Description IN/OUT CATH URINE    Special Requests NONE    Culture (A)     >=100,000 COLONIES/mL ESCHERICHIA COLI SUSCEPTIBILITIES TO FOLLOW Performed at [facility], [place] [address] [place], [place] [address]    Report Status PENDING   Lactic acid, plasma     Status: Abnormal   Collection Time: 11/05/2022  9:07 PM  Result Value Ref Range   Lactic Acid, Venous 6.3 (HH) 0.5 - 1.9 mmol/L    Comment: CRITICAL VALUE NOTED. VALUE IS CONSISTENT WITH PREVIOUSLY REPORTED/CALLED  VALUE Performed at [facility], [place] [address], [place], [place] [address]   Comprehensive metabolic panel     Status: Abnormal   Collection Time: 11/02/2022  9:07 PM  Result Value Ref Range   Sodium 138 135 - 145 mmol/L   Potassium 5.4 (H) 3.5 - 5.1 mmol/L   Chloride 102 98 - 111 mmol/L   CO2 21 (L) 22 - 32 mmol/L   Glucose, Bld 226 (H) 70 - 99 mg/dL    Comment: Glucose reference range applies only to samples taken after fasting for at least 8 hours.   BUN 48 (H) 8 - 23 mg/dL   Creatinine, Ser 4.12 (H) 0.44 - 1.00 mg/dL   Calcium 8.3 (L) 8.9 - 10.3 mg/dL   Total Protein 5.7 (L) 6.5 - 8.1 g/dL   Albumin <1.5 (L) 3.5 - 5.0 g/dL   AST 63 (H) 15 - 41 U/L   ALT 43 0 - 44 U/L    Comment: RESULT CONFIRMED BY MANUAL DILUTION   Alkaline Phosphatase 170 (H) 38 - 126 U/L   Total Bilirubin 1.9 (H) 0.3 - 1.2 mg/dL   GFR, Estimated 11 (L) >60 mL/min    Comment: (NOTE) Calculated using the CKD-EPI Creatinine Equation (2021)    Anion gap 15 5 - 15    Comment: Performed at [place] [address] [place], [place] [address]  Troponin I (High Sensitivity)     Status: Abnormal  Collection Time: 10/30/2022  9:07 PM  Result Value Ref Range   Troponin I (High Sensitivity) 33 (H) <18 ng/L    Comment: (NOTE) Elevated high sensitivity troponin I (hsTnI) values and significant  changes across serial measurements may suggest ACS but many other  chronic and acute conditions are known to elevate hsTnI results.  Refer to the "Links" section for chest pain algorithms and additional  guidance. Performed at [facility], [place] [address], [place], [place] [address]   Urinalysis, Routine w reflex microscopic Urine, In & Out Cath     Status: Abnormal   Collection Time: 11/16/2022 10:00 PM  Result Value Ref Range   Color, Urine YELLOW YELLOW   APPearance TURBID (A) CLEAR   Specific Gravity, Urine 1.025 1.005 - 1.030   pH 5.5 5.0 - 8.0   Glucose, UA NEGATIVE NEGATIVE mg/dL   Hgb urine  dipstick LARGE (A) NEGATIVE   Bilirubin Urine MODERATE (A) NEGATIVE   Ketones, ur NEGATIVE NEGATIVE mg/dL   Protein, ur 100 (A) NEGATIVE mg/dL   Nitrite NEGATIVE NEGATIVE   Leukocytes,Ua MODERATE (A) NEGATIVE    Comment: Performed at [facility] [address], [place], [place] [address]  Urinalysis, Microscopic (reflex)     Status: Abnormal   Collection Time: 11/15/2022 10:00 PM  Result Value Ref Range   RBC / HPF 11-20 0 - 5 RBC/hpf   WBC, UA >50 0 - 5 WBC/hpf   Bacteria, UA MANY (A) NONE SEEN   Squamous Epithelial / HPF 6-10 0 - 5 /HPF    Comment: Performed at [facility], [place] [address] [place], [place] [address]  Lactic acid, plasma     Status: Abnormal   Collection Time: 11/19/22 12:45 AM  Result Value Ref Range   Lactic Acid, Venous >9.0 (HH) 0.5 - 1.9 mmol/L    Comment: CRITICAL VALUE NOTED.  VALUE IS CONSISTENT WITH PREVIOUSLY REPORTED AND CALLED VALUE. Performed at [facility], [place] [address], [place], [place] [address] CORRECTED ON 01/22 AT 0839: PREVIOUSLY REPORTED AS >9.0 RESULT CONFIRMED BY MANUAL DILUTION   MRSA Next Gen by PCR, Nasal     Status: None   Collection Time: 11/19/22  5:34 AM   Specimen: Nasal Mucosa; Nasal Swab  Result Value Ref Range   MRSA by PCR Next Gen NOT DETECTED NOT DETECTED    Comment: (NOTE) The GeneXpert MRSA Assay (FDA approved for NASAL specimens only), is one component of a comprehensive MRSA colonization surveillance program. It is not intended to diagnose MRSA infection nor to guide or monitor treatment for MRSA infections. Test performance is not FDA approved in patients less than 62 years old. Performed at [facility], [place] [address], [place], [place] [address]   Glucose, capillary     Status: Abnormal   Collection Time: 11/19/22  6:00 AM  Result Value Ref Range   Glucose-Capillary 187 (H) 70 - 99 mg/dL    Comment: Glucose reference range applies only to samples taken after fasting for at least 8 hours.   Comprehensive metabolic panel     Status: Abnormal   Collection Time: 11/19/22  6:23 AM  Result Value Ref Range   Sodium 139 135 - 145 mmol/L   Potassium 5.2 (H) 3.5 - 5.1 mmol/L   Chloride 102 98 - 111 mmol/L   CO2 17 (L) 22 - 32 mmol/L   Glucose, Bld 193 (H) 70 - 99 mg/dL    Comment: Glucose reference  range applies only to samples taken after fasting for at least 8 hours.   BUN 46 (H) 8 - 23 mg/dL   Creatinine, Ser 1.613.82 (H) 0.44 - 1.00 mg/dL   Calcium 8.5 (L) 8.9 - 10.3 mg/dL   Total Protein 6.1 (L) 6.5 - 8.1 g/dL   Albumin 1.6 (L) 3.5 - 5.0 g/dL   AST 64 (H) 15 - 41 U/L   ALT 40 0 - 44 U/L   Alkaline Phosphatase 192 (H) 38 - 126 U/L   Total Bilirubin 1.9 (H) 0.3 - 1.2 mg/dL   GFR, Estimated 13 (L) >60 mL/min    Comment: (NOTE) Calculated using the CKD-EPI Creatinine Equation (2021)    Anion gap 20 (H) 5 - 15    Comment: Performed at [facility], [address] [place], [place] [address]  CBC     Status: Abnormal   Collection Time: 11/19/22  6:23 AM  Result Value Ref Range   WBC 26.3 (H) 4.0 - 10.5 K/uL   RBC 3.00 (L) 3.87 - 5.11 MIL/uL   Hemoglobin 10.6 (L) 12.0 - 15.0 g/dL   HCT 54.032.6 (L) 98.136.0 - 19.146.0 %   MCV 108.7 (H) 80.0 - 100.0 fL   MCH 35.3 (H) 26.0 - 34.0 pg   MCHC 32.5 30.0 - 36.0 g/dL   RDW 47.816.6 (H) 29.511.5 - 62.115.5 %   Platelets 63 (L) 150 - 400 K/uL    Comment: Immature Platelet Fraction may be clinically indicated, consider ordering this additional test [id number] REPEATED TO VERIFY    nRBC 0.0 0.0 - 0.2 %    Comment: Performed at [facility], [address] [place], [place] [address]  Magnesium     Status: None   Collection Time: 11/19/22  6:23 AM  Result Value Ref Range   Magnesium 1.9 1.7 - 2.4 mg/dL    Comment: Performed at [facility], [address] [place], [place] [address]  Phosphorus     Status: None   Collection Time: 11/19/22  6:23 AM  Result Value Ref Range   Phosphorus 4.3 2.5 - 4.6 mg/dL    Comment: Performed at [facility], [address] [place], [place] [address]  Lactic acid, plasma     Status: Abnormal   Collection Time: 11/19/22  6:23 AM  Result Value Ref Range   Lactic Acid, Venous 8.1 (HH) 0.5 - 1.9 mmol/L    Comment: CRITICAL VALUE NOTED. VALUE IS CONSISTENT WITH PREVIOUSLY REPORTED/CALLED VALUE Performed at [facility], [address] [place], [place] [address]   Basic metabolic panel     Status: Abnormal   Collection Time: 11/19/22  9:09 AM  Result Value Ref Range   Sodium 139 135 - 145 mmol/L   Potassium 5.8 (H) 3.5 - 5.1 mmol/L   Chloride 104 98 - 111 mmol/L   CO2 16 (L) 22 - 32 mmol/L   Glucose, Bld 155 (H) 70 - 99 mg/dL    Comment: Glucose reference range applies only to samples taken after fasting for at least 8 hours.   BUN 47 (H) 8 - 23 mg/dL   Creatinine, Ser 5.363.88 (H) 0.44 - 1.00 mg/dL   Calcium 8.3 (L) 8.9 - 10.3 mg/dL   GFR, Estimated 12 (L) >60 mL/min    Comment: (NOTE) Calculated using the CKD-EPI Creatinine Equation (2021)    Anion gap 19 (H) 5 - 15    Comment: Performed at [facility],  [address], [place], [place] [address]  Lactic acid, plasma     Status: Abnormal   Collection Time: 11/19/22  9:09 AM  Result Value Ref Range   Lactic Acid, Venous 7.5 (HH) 0.5 - 1.9 mmol/L    Comment: CRITICAL VALUE NOTED. VALUE IS CONSISTENT WITH PREVIOUSLY REPORTED/CALLED VALUE Performed at [facility], [address] [place], [place] [address]   Magnesium     Status: None   Collection Time: 11/19/22  9:09 AM  Result Value Ref Range   Magnesium 1.9 1.7 - 2.4 mg/dL    Comment: Performed at [facility], [address] [place], [place] [address]  Phosphorus     Status: None   Collection Time: 11/19/22  9:09 AM  Result Value Ref Range   Phosphorus 4.2 2.5 - 4.6 mg/dL    Comment: Performed at [facility], [address], [place], [place] [address]  Basic metabolic panel     Status: Abnormal   Collection Time: 11/19/22  4:03 PM   Result Value Ref Range   Sodium 137 135 - 145 mmol/L   Potassium 4.5 3.5 - 5.1 mmol/L   Chloride 103 98 - 111 mmol/L   CO2 22 22 - 32 mmol/L   Glucose, Bld 214 (H) 70 - 99 mg/dL    Comment: Glucose reference range applies only to samples taken after fasting for at least 8 hours.   BUN 43 (H) 8 - 23 mg/dL   Creatinine, Ser 8.67 (H) 0.44 - 1.00 mg/dL   Calcium 8.5 (L) 8.9 - 10.3 mg/dL   GFR, Estimated 14 (L) >60 mL/min    Comment: (NOTE) Calculated using the CKD-EPI Creatinine Equation (2021)    Anion gap 12 5 - 15    Comment: Performed at [facility], [address], [place], [place] [address]  Ammonia     Status: None   Collection Time: 11/19/22  4:03 PM  Result Value Ref Range   Ammonia 13 9 - 35 umol/L    Comment: Performed at [facility], [address] [place], [place] [address]  Glucose, capillary     Status: Abnormal   Collection Time: 11/19/22  5:47 PM  Result Value Ref Range   Glucose-Capillary 201 (H) 70 - 99 mg/dL    Comment: Glucose reference range applies only to samples taken after fasting for at least 8 hours.  Glucose, capillary     Status: Abnormal   Collection Time: 11/19/22  8:05 PM  Result Value Ref Range   Glucose-Capillary 193 (H) 70 - 99 mg/dL    Comment: Glucose reference range applies only to samples taken after fasting for at least 8 hours.   Comment 1 Document in Chart   Basic metabolic panel     Status: Abnormal   Collection Time: 11/19/22 10:37 PM  Result Value Ref Range   Sodium 134 (L) 135 - 145 mmol/L   Potassium 4.5 3.5 - 5.1 mmol/L   Chloride 102 98 - 111 mmol/L   CO2 21 (L) 22 - 32 mmol/L   Glucose, Bld 231 (H) 70 - 99 mg/dL    Comment: Glucose reference range applies only to samples taken after fasting for at least 8 hours.   BUN 44 (H) 8 - 23 mg/dL   Creatinine, Ser 1.24 (H) 0.44 - 1.00 mg/dL   Calcium 8.3 (L) 8.9 - 10.3 mg/dL   GFR, Estimated 14 (L) >60 mL/min    Comment: (NOTE)  Calculated using the CKD-EPI Creatinine  Equation (2021)    Anion gap 11 5 - 15    Comment: Performed at [facility], [address] [place], [place] [address]  Glucose, capillary     Status: Abnormal   Collection Time: 11/19/22 11:25 PM  Result Value Ref Range   Glucose-Capillary 207 (H) 70 - 99 mg/dL    Comment: Glucose reference range applies only to samples taken after fasting for at least 8 hours.   Comment 1 Document in Chart   Glucose, capillary     Status: Abnormal   Collection Time: 11/20/22  4:07 AM  Result Value Ref Range   Glucose-Capillary 162 (H) 70 - 99 mg/dL    Comment: Glucose reference range applies only to samples taken after fasting for at least 8 hours.  Magnesium     Status: Abnormal   Collection Time: 11/20/22  7:21 AM  Result Value Ref Range   Magnesium 1.6 (L) 1.7 - 2.4 mg/dL    Comment: Performed at [facility], [address] [place], [place] [address]  Phosphorus     Status: None   Collection Time: 11/20/22  7:21 AM  Result Value Ref Range   Phosphorus 2.6 2.5 - 4.6 mg/dL    Comment: Performed at [facility], [address] [place], [place] [address]  CBC     Status: Abnormal   Collection Time: 11/20/22  7:21 AM  Result Value Ref Range   WBC 24.9 (H) 4.0 - 10.5 K/uL   RBC 2.40 (L) 3.87 - 5.11 MIL/uL   Hemoglobin 8.6 (L) 12.0 - 15.0 g/dL   HCT 91.425.2 (L) 78.236.0 - 95.646.0 %   MCV 105.0 (H) 80.0 - 100.0 fL   MCH 35.8 (H) 26.0 - 34.0 pg   MCHC 34.1 30.0 - 36.0 g/dL   RDW 21.316.6 (H) 08.611.5 - 57.815.5 %   Platelets 53 (L) 150 - 400 K/uL    Comment: Immature Platelet Fraction may be clinically indicated, consider ordering this additional test [id number] REPEATED TO VERIFY    nRBC 0.2 0.0 - 0.2 %    Comment: Performed at [facility], [address] [place], [place] [address]  Comprehensive metabolic panel     Status: Abnormal   Collection Time: 11/20/22  7:21 AM  Result Value Ref Range   Sodium 136 135 - 145 mmol/L   Potassium 4.0 3.5 - 5.1 mmol/L   Chloride 103 98 - 111  mmol/L   CO2 23 22 - 32 mmol/L   Glucose, Bld 195 (H) 70 - 99 mg/dL    Comment: Glucose reference range applies only to samples taken after fasting for at least 8 hours.   BUN 43 (H) 8 - 23 mg/dL   Creatinine, Ser 4.403.31 (H) 0.44 - 1.00 mg/dL   Calcium 8.5 (L) 8.9 - 10.3 mg/dL   Total Protein 5.5 (L) 6.5 - 8.1 g/dL   Albumin 1.8 (L) 3.5 - 5.0 g/dL   AST 40 15 - 41 U/L   ALT 33 0 - 44 U/L   Alkaline Phosphatase 155 (H) 38 - 126 U/L   Total Bilirubin 1.1 0.3 - 1.2 mg/dL   GFR, Estimated 15 (L) >60 mL/min    Comment: (NOTE) Calculated using the CKD-EPI Creatinine Equation (2021)    Anion gap 10 5 - 15    Comment: Performed at [facility], [address] [place], [place] [address]  Glucose, capillary  Status: Abnormal   Collection Time: 11/20/22  8:08 AM  Result Value Ref Range   Glucose-Capillary 207 (H) 70 - 99 mg/dL    Comment: Glucose reference range applies only to samples taken after fasting for at least 8 hours.  Lactic acid, plasma     Status: Abnormal   Collection Time: 11/20/22 11:34 AM  Result Value Ref Range   Lactic Acid, Venous 2.6 (HH) 0.5 - 1.9 mmol/L    Comment: CRITICAL VALUE NOTED. VALUE IS CONSISTENT WITH PREVIOUSLY REPORTED/CALLED VALUE Performed at [facility], [address] [place], [place] [address]   Glucose, capillary     Status: Abnormal   Collection Time: 11/20/22 12:06 PM  Result Value Ref Range   Glucose-Capillary 230 (H) 70 - 99 mg/dL    Comment: Glucose reference range applies only to samples taken after fasting for at least 8 hours.  Magnesium     Status: None   Collection Time: 11/20/22  3:28 PM  Result Value Ref Range   Magnesium 2.0 1.7 - 2.4 mg/dL    Comment: Performed at [facility], [address] [place], [place] [address]  Phosphorus     Status: Abnormal   Collection Time: 11/20/22  3:28 PM  Result Value Ref Range   Phosphorus 1.9 (L) 2.5 - 4.6 mg/dL    Comment: Performed at [facility], [address] [place], [place] [address]  Lactic acid, plasma     Status: Abnormal   Collection Time: 11/20/22  3:28 PM  Result Value Ref Range   Lactic Acid, Venous 2.4 (HH) 0.5 - 1.9 mmol/L    Comment: CRITICAL VALUE NOTED.  VALUE IS CONSISTENT WITH PREVIOUSLY REPORTED AND CALLED VALUE. Performed at [facility], [address] [place], [place] [address]   Glucose, capillary     Status: Abnormal   Collection Time: 11/20/22  3:38 PM  Result Value Ref Range   Glucose-Capillary 166 (H) 70 - 99 mg/dL    Comment: Glucose reference range applies only to samples taken after fasting for at least 8 hours.    VAS US LOWER EXTREMITY VENOUS (DVT)  Result Date: 11/20/2022  Lower Venous DVT Study Patient Name:  Samantha PinkMABLE Blasdel  Date of Exam:   11/20/2022 Medical Rec #: [id number]      Accession #:    [id number] Date of Birth: Apr 11, 1956      Patient Gender: F Patient Age:   [age] years [facility] Procedure:      VAS US LOWER EXTREMITY VENOUS (DVT) Referring Phys: Karie FetchLAURA CLARK --------------------------------------------------------------------------------  Indications: Swelling, and Edema. Other Indications: History of small bowel obstruction. Comparison Study: 01/22/22 Left lower ext venous positive for acute DVT in                   PTV/PERO veins. Performing Technologist: Marilynne Halstedita Sturdivant RDMS, RVT  Examination Guidelines: A complete evaluation includes B-mode imaging, spectral Doppler, color Doppler, and power Doppler as needed of all accessible portions of each vessel. Bilateral testing is considered an integral part of a complete examination. Limited examinations for reoccurring indications may be performed as noted. The reflux portion of the exam is performed with the patient in reverse Trendelenburg.  +---------+---------------+---------+-----------+----------+-------------------+ RIGHT    CompressibilityPhasicitySpontaneityPropertiesThrombus Aging       +---------+---------------+---------+-----------+----------+-------------------+ CFV      None           No       No  Acute               +---------+---------------+---------+-----------+----------+-------------------+ SFJ      Partial                                                          +---------+---------------+---------+-----------+----------+-------------------+ FV Prox  None           No       No                   Age Indeterminate   +---------+---------------+---------+-----------+----------+-------------------+ FV Mid   None           No       No                   Age Indeterminate   +---------+---------------+---------+-----------+----------+-------------------+ FV DistalNone           No       No                   Age Indeterminate   +---------+---------------+---------+-----------+----------+-------------------+ PFV                                                   Not well visualized +---------+---------------+---------+-----------+----------+-------------------+ POP      None           No       No                   Age Indeterminate   +---------+---------------+---------+-----------+----------+-------------------+ PTV      Partial                                                          +---------+---------------+---------+-----------+----------+-------------------+ PERO     Partial                                      poorly visualized   +---------+---------------+---------+-----------+----------+-------------------+ REIV     None                                         Age Indeterminate   +---------+---------------+---------+-----------+----------+-------------------+ Right iliac veins were poorly visualized due to patient's body habitus.  +----+---------------+---------+-----------+----------+--------------+ LEFTCompressibilityPhasicitySpontaneityPropertiesThrombus Aging  +----+---------------+---------+-----------+----------+--------------+ CFV Full           Yes      Yes                                 +----+---------------+---------+-----------+----------+--------------+ SFJ Full                                                        +----+---------------+---------+-----------+----------+--------------+  Summary: RIGHT: - Findings consistent with acute deep vein thrombosis involving the right common femoral vein. - Findings consistent with age indeterminate deep vein thrombosis involving the right femoral vein, right popliteal vein, right posterior tibial veins, right peroneal veins, and external iliac vein.  LEFT: - No evidence of common femoral vein obstruction.  *See table(s) above for measurements and observations. Electronically signed by Waverly Ferrari MD on 11/20/2022 at 4:24:23 PM.    Final    [place] EKG SITE RITE  Result Date: 11/20/2022 If Site Rite image not attached, placement could not be confirmed due to current cardiac rhythm.  DG Chest Port 1 View  Result Date: 11/20/2022 CLINICAL DATA:  Shortness of breath EXAM: PORTABLE CHEST 1 VIEW COMPARISON:  Chest x-ray dated November 22, 2022 FINDINGS: Feeding tube tip is in the stomach. The heart size and mediastinal contours are within normal limits. Both lungs are clear. The visualized skeletal structures are unremarkable. IMPRESSION: No active disease. Feeding tube tip is in the stomach. Electronically Signed   By: Allegra Lai M.D.   On: 11/20/2022 08:21   DG Abd Portable 1V  Result Date: 11/19/2022 CLINICAL DATA:  Encounter for feeding tube placement EXAM: PORTABLE ABDOMEN - 1 VIEW COMPARISON:  None Available. FINDINGS: Feeding tube with tip in the gastric fundus.  No bowel obstruction. IMPRESSION: Feeding tube in stomach as above Electronically Signed   By: Genevive Bi M.D.   On: 11/19/2022 16:22   DG Abd Portable 1V  Result Date: 11/19/2022 CLINICAL DATA:  Feeding tube  placement EXAM: PORTABLE ABDOMEN - 1 VIEW COMPARISON:  Abdominal radiograph dated January 27, 2022 FINDINGS: Feeding tube tip is in the stomach. Visualized bowel gas pattern is normal. No radio-opaque calculi or other significant radiographic abnormality are seen. IMPRESSION: Feeding tube tip is in the stomach. Electronically Signed   By: Allegra Lai M.D.   On: 11/19/2022 15:30   US RENAL  Result Date: 11/19/2022 CLINICAL DATA:  Oliguria EXAM: RENAL / URINARY TRACT ULTRASOUND COMPLETE COMPARISON:  01/14/2022 renal ultrasound FINDINGS: Right Kidney: Renal measurements: 12.1 x 5.4 x 5.4 cm = volume: 182 mL, previously 230 mL. Echogenicity within normal limits. No mass or hydronephrosis visualized. Left Kidney: Renal measurements: 8.3 x 4.3 x 3.2 cm = volume: 60 mL, previously 100 mL. The kidney is small and increased in echogenicity, with a contour suggestive of scarring. No mass or hydronephrosis visualized. Bladder: The bladder contains a Foley and is decompressed. Other: None. IMPRESSION: 1. Left renal scarring and atrophy, which has progressed from the prior exam 2. No acute abnormality in the right kidney. Electronically Signed   By: Wiliam Ke M.D.   On: 11/19/2022 11:38   CT CHEST ABDOMEN PELVIS WO CONTRAST  Result Date: 11/19/2022 CLINICAL DATA:  Sepsis EXAM: CT CHEST, ABDOMEN AND PELVIS WITHOUT CONTRAST TECHNIQUE: Multidetector CT imaging of the chest, abdomen and pelvis was performed following the standard protocol without IV contrast. RADIATION DOSE REDUCTION: This exam was performed according to the departmental dose-optimization program which includes automated exposure control, adjustment of the mA and/or kV according to patient size and/or use of iterative reconstruction technique. COMPARISON:  None Available. FINDINGS: CT CHEST FINDINGS Cardiovascular: Moderate multi-vessel coronary artery calcification. Global cardiac size within normal limits. No pericardial effusion. Central pulmonary  arteries are enlarged in keeping with changes of pulmonary arterial hypertension. Mild atherosclerotic calcification within the thoracic aorta. No aortic aneurysm. Mediastinum/Nodes: No enlarged mediastinal, hilar, or axillary lymph nodes. Thyroid gland, trachea, and esophagus demonstrate no significant  findings. Lungs/Pleura: Lungs are clear. No pleural effusion or pneumothorax. Musculoskeletal: No acute bone abnormality. No lytic or blastic bone lesion. CT ABDOMEN PELVIS FINDINGS Hepatobiliary: No focal liver abnormality is seen. No gallstones, gallbladder wall thickening, or biliary dilatation. Pancreas: Unremarkable. No pancreatic ductal dilatation or surrounding inflammatory changes. Spleen: Unremarkable Adrenals/Urinary Tract: The adrenal glands are unremarkable. The kidneys are normal in position. Marked left renal cortical scarring and atrophy. The right kidney is normal in size. Nonobstructing calculi are noted within the lower pole of the kidneys bilaterally measuring up to 7 mm on the left and 4 mm on the right. Additional vascular calcifications noted within the renal hila. No hydronephrosis. No ureteral calculi. The bladder is unremarkable. Stomach/Bowel: The stomach, small bowel, and large bowel are unremarkable. Appendix normal. No free intraperitoneal gas or fluid. Vascular/Lymphatic: Mild aortoiliac atherosclerotic calcification. No aortic aneurysm. No pathologic adenopathy within the abdomen and pelvis. Reproductive: Uterus and bilateral adnexa are unremarkable. Other: There is extensive subcutaneous gas seen within the left peroneal soft tissues along the posterior aspect of the left labial majora and within the perianal subcutaneous fat. There is superimposed debris within this region best appreciated on image # 125/3 without discrete loculated fluid collection identified. The subcutaneous gas, suggestive of an aggressive soft tissue infection, extends to the left levator ani musculature, but  does not appear to extend into the deep pelvis. Additionally there is extension of subcutaneous gas and inflammatory changes superficial to the sacrum to the level of the iliac spines bilaterally. There is asymmetric extensive subcutaneous edema within the a dependent subcutaneous fat of the proximal right thigh which may relate to dependent positioning or a localized inflammatory process. No loculated fluid collection or subcutaneous gas noted in this region. Musculoskeletal: No acute bone abnormality. No lytic or blastic bone lesion. IMPRESSION: 1. Extensive subcutaneous gas and debris within the left perineal soft tissues along the posterior aspect of the left labia majora and within the perianal subcutaneous in keeping with an aggressive soft tissue infection. Inflammatory change extends to the left levator ani musculature, but does not appear to extend into the deep pelvis, and extends superficial to the sacrum to the level of the iliac spines. No loculated subcutaneous fluid collection identified. 2. Asymmetric extensive subcutaneous edema within the dependent subcutaneous fat of the proximal right thigh which may relate to dependent positioning or a localized inflammatory process. No loculated fluid collection or subcutaneous gas noted in this region. 3. Moderate multi-vessel coronary artery calcification. 4. Morphologic changes in keeping with pulmonary arterial hypertension. 5. Marked left renal cortical scarring and atrophy. Mild bilateral nonobstructing nephrolithiasis. 6.  Aortic Atherosclerosis (ICD10-I70.0). Electronically Signed   By: Helyn Numbers M.D.   On: 11/19/2022 00:29   CT HEAD WO CONTRAST ( )  Result Date: 11/19/2022 CLINICAL DATA:  Altered mental status EXAM: CT HEAD WITHOUT CONTRAST TECHNIQUE: Contiguous axial images were obtained from the base of the skull through the vertex without intravenous contrast. RADIATION DOSE REDUCTION: This exam was performed according to the departmental  dose-optimization program which includes automated exposure control, adjustment of the mA and/or kV according to patient size and/or use of iterative reconstruction technique. COMPARISON:  None Available. FINDINGS: Brain: There is no mass, hemorrhage or extra-axial collection. There is generalized atrophy without lobar predilection. Hypodensity of the white matter is most commonly associated with chronic microvascular disease. 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 Vascular: No abnormal hyperdensity of the major intracranial arteries or dural venous sinuses.  No intracranial atherosclerosis. Skull: The visualized skull base, calvarium and extracranial soft tissues are normal. Sinuses/Orbits: No fluid levels or advanced mucosal thickening of the visualized paranasal sinuses. No mastoid or middle ear effusion. The orbits are normal. IMPRESSION: 1. No acute intracranial abnormality. 2. 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 3. Chronic microvascular ischemia. Electronically Signed   By: Deatra Robinson M.D.   On: 11/19/2022 00:13   DG Chest Port 1 View  Result Date: 11/20/2022 CLINICAL DATA:  Sepsis, became nonverbal, dehydrated EXAM: PORTABLE CHEST 1 VIEW COMPARISON:  Portable exam 1951 hours compared to 04/13/2022 FINDINGS: Normal heart size, mediastinal contours, and pulmonary vascularity. Atherosclerotic calcification aorta. Lungs clear. No infiltrate, pleural effusion, or pneumothorax. No acute osseous findings. IMPRESSION: No acute abnormalities. Aortic Atherosclerosis (ICD10-I70.0). Electronically Signed   By: Ulyses Southward M.D.   On: 11/11/2022 20:15

## 2022-11-20 NOTE — Evaluation (Signed)
Clinical/Bedside Swallow Evaluation Patient Details  Name: Samantha [name] MRN: [id number] Date of Birth: Jun 05, 1956  Today's Date: 11/20/2022 Time: SLP Start Time (ACUTE ONLY): 1011 SLP Stop Time (ACUTE ONLY): 1022 SLP Time Calculation (min) (ACUTE ONLY): 11 min  Past Medical History:  Past Medical History:  Diagnosis Date   CVA (cerebral vascular accident) ([place])    R-sided deficits   Deep vein thrombosis (DVT) ([facility])    Previously on Eliquis   HTN (hypertension)    Hyperlipidemia    T2DM (type 2 diabetes mellitus) ([place])    Past Surgical History:  Past Surgical History:  Procedure Laterality Date   TUBAL LIGATION     23 yrs ago   HPI:  67 year old woman who presented to [facility] ED 1/21 via EMS for AMS.  Pt found to be septic due to soft tissue infection around perineum. Head CT 1/21 and CXR 1/23 with no acute findings. PMHx significant for HTN, HLD, T2DM, CVA with R-sided deficits, LLE DVT (12/2021, previously on Eliquis), depression. No hx dysphagia from prior CVA.    Assessment / Plan / Recommendation  Clinical Impression  Pt seen for swallowing assessment in setting of AMS 2/2 sepsis.  Pt with prior CVA but no hx dysphagia per chart review and husband's report.  SLP provided oral care prior to administration of PO trials.  Pt with bright red secretions in oral cavity, but was defensive to oral care and oral cavity could not be visualized to determing if active bleeding is occuring.  Pt withdrew from bolus presentation; turned head away from thermal stimulation to lips with ice.  With small amount of water by spoon which was poured into oral cavity there was bilateral anterior loss of majority-totality of bolus. There was no pharyngeal swallow response on first trial and suction was used to assist in clearance of oral cavity.  Pt exhibited pharyngeal swallow on 2nd trial, but given amount of anterior loss it is unclear if this was in response to bolus presentation or if it was a spontaneous  swallow for secretion management.  Pt is not appropriate for PO diet at this time. SLP to follow for additional PO trials with hopeful improvement in AMS with treatment.    Recommend pt remain NPO with alternate means of nutrition, hydration, and medication.  SLP Visit Diagnosis: Dysphagia, unspecified (R13.10)    Aspiration Risk  Risk for inadequate nutrition/hydration    Diet Recommendation NPO        Other  Recommendations Oral Care Recommendations: Oral care QID    Recommendations for follow up therapy are one component of a multi-disciplinary discharge planning process, led by the attending physician.  Recommendations may be updated based on patient status, additional functional criteria and insurance authorization.  Follow up Recommendations  (TBD)      Assistance Recommended at Discharge    Functional Status Assessment Patient has had a recent decline in their functional status and demonstrates the ability to make significant improvements in function in a reasonable and predictable amount of time.  Frequency and Duration min 2x/week  2 weeks       Prognosis Prognosis for Safe Diet Advancement: Good      Swallow Study   General Date of Onset: 11/13/2022 HPI: 67 year old woman who presented to [facility] ED 1/21 via EMS for AMS.  Pt found to be septic due to soft tissue infection around perineum. Head CT 1/21 and CXR 1/23 with no acute findings. PMHx significant for HTN, HLD, T2DM, CVA with R-sided deficits,  LLE DVT (12/2021, previously on Eliquis), depression. No hx dysphagia from prior CVA.    Oral/Motor/Sensory Function Overall Oral Motor/Sensory Function:  (Unable to assess)   Ice Chips Ice chips: Impaired Oral Phase Impairments:  (Poor bolus acceptance)   Thin Liquid Thin Liquid: Impaired Oral Phase Impairments: Poor awareness of bolus (Poor bolus acceptance) Oral Phase Functional Implications: Right anterior spillage;Left anterior spillage    Nectar Thick Nectar Thick  Liquid: Not tested   Honey Thick Honey Thick Liquid: Not tested   Puree Puree: Not tested   Solid     Solid: Not tested      Celedonio Savage, [place], [place] Office: [phone] 11/20/2022,10:47 AM

## 2022-11-20 NOTE — Progress Notes (Signed)
CSW acknowledges PT/OT recommendation. CSW following to discuss PT/OT recommendation closer to patient being medically ready for dc.Patient currently has cortrak. CSW will continue to follow and assist with patients dc planning needs.

## 2022-11-20 NOTE — Evaluation (Addendum)
Physical Therapy Evaluation Patient Details Name: Samantha [name] MRN: [id number] DOB: 1956-04-13 Today's Date: 11/20/2022  History of Present Illness  Pt is a 67 y.o. F who presents 11/19/2022 in the setting of developing sepsis and persistent lactic elevation. CT Chest/A/P with extensive subcutaneous gas/debris within L perineal soft tissue, tracking along posterior L labia majora/perianal subcutaneous tissue with surrounding inflammation, no loculated fluid collection.Significant PMH: HTN, HLD, T2DM, CVA with R-sided deficits, LLE DVT (12/2021, previously on Eliquis), depression.  Clinical Impression  PTA, pt lives with her spouse, requires assist for stand pivot transfers to wheelchair and toilet, and for ADL's. Pt spouse reports pt increasingly bed bound with decreased PO intake in few days prior to admission. Pt presents with right hemiparesis, impaired communication, decreased ROM, decreased skin integrity, cognitive deficits, and poor balance. Pt requiring two person maximal assist for bed mobility. RN present for skin assessment and to place dressings and Prevalon boots. Due to deficits and medical complexity, recommend SNF at d/c. Will continue to follow acutely.     Recommendations for follow up therapy are one component of a multi-disciplinary discharge planning process, led by the attending physician.  Recommendations may be updated based on patient status, additional functional criteria and insurance authorization.  Follow Up Recommendations Skilled nursing-short term rehab (<3 hours/day) Can patient physically be transported by private vehicle: No    Assistance Recommended at Discharge Frequent or constant Supervision/Assistance  Patient can return home with the following  Two people to help with walking and/or transfers;Two people to help with bathing/dressing/bathroom;Assistance with cooking/housework;Direct supervision/assist for medications management;Direct supervision/assist for  financial management;Assist for transportation;Help with stairs or ramp for entrance;Assistance with feeding    Equipment Recommendations Hospital bed;Other (comment) (hospital bed with air mattress overlay, hoyer lift)  Recommendations for Other Services       Functional Status Assessment Patient has had a recent decline in their functional status and/or demonstrates limited ability to make significant improvements in function in a reasonable and predictable amount of time     Precautions / Restrictions Precautions Precautions: Fall;Other (comment) Precaution Comments: NGT, chronic R hemiparesis Restrictions Weight Bearing Restrictions: No      Mobility  Bed Mobility Overal bed mobility: Needs Assistance Bed Mobility: Rolling Rolling: Max assist, +2 for physical assistance         General bed mobility comments: MaxA + 2 for rolling to R/L, decreased initiation by pt    Transfers                   General transfer comment: deferred    Ambulation/Gait                  Stairs            Wheelchair Mobility    Modified Rankin (Stroke Patients Only)       Balance                                             Pertinent Vitals/Pain Pain Assessment Pain Assessment: PAINAD Breathing: normal Negative Vocalization: none Facial Expression: facial grimacing Body Language: rigid, fists clenched, knees up, pushing/pulling away, strikes out Consolability: distracted or reassured by voice/touch PAINAD Score: 5 Pain Location: R side, generalized with movement Pain Descriptors / Indicators: Grimacing, Other (Comment) (tense) Pain Intervention(s): Limited activity within patient's tolerance, Monitored during session, Repositioned    Home Living Family/patient  expects to be discharged to:: Private residence Living Arrangements: Spouse/significant other Available Help at Discharge: Family;Available 24 hours/day Type of Home:  House Home Access: Ramped entrance       Home Layout: One level Home Equipment: Conservation officer, nature (2 wheels);BSC/3in1;Wheelchair - manual      Prior Function Prior Level of Function : Needs assist             Mobility Comments: pt requiring assist for stand pivot transfers to toilet/wheelchair/car. increasingly bed bound in several days leading up to hospital admission ADLs Comments: requiring assist for ADL's, feeds self intermittently, decreased PO intake in days leading up to admission     Hand Dominance   Dominant Hand: Right    Extremity/Trunk Assessment   Upper Extremity Assessment Upper Extremity Assessment: Defer to OT evaluation    Lower Extremity Assessment Lower Extremity Assessment: RLE deficits/detail;LLE deficits/detail RLE Deficits / Details: No active movement noted, ankle dorsiflexion to neutral. DPTI on heel LLE Deficits / Details: Ankle dorsiflexion to neutral, increased quad tightness. Wiggling toes sponteously       Communication   Communication: Receptive difficulties;Expressive difficulties  Cognition Arousal/Alertness: Awake/alert Behavior During Therapy: Flat affect Overall Cognitive Status: History of cognitive impairments - at baseline                                 General Comments: Pt husband reporting pt verbal and following commands at home, however, pt following ~5% of 1 step commands and no attempts to verbalize this session. Likely internally distracted by pain and discomfort as well        General Comments General comments (skin integrity, edema, etc.): Multitude of wounds and DPTI along buttocks/hips/R heel/lateral to R scap    Exercises     Assessment/Plan    PT Assessment Patient needs continued PT services  PT Problem List Decreased strength;Decreased activity tolerance;Decreased balance;Decreased mobility;Decreased cognition;Decreased range of motion;Decreased skin integrity       PT Treatment  Interventions Functional mobility training;Therapeutic activities;Therapeutic exercise;Balance training;DME instruction;Patient/family education;Wheelchair mobility training    PT Goals (Current goals can be found in the Care Plan section)  Acute Rehab PT Goals Patient Stated Goal: pt could not state PT Goal Formulation: With patient Time For Goal Achievement: 12/04/22 Potential to Achieve Goals: Fair    Frequency Min 2X/week     Co-evaluation PT/OT/SLP Co-Evaluation/Treatment: Yes Reason for Co-Treatment: For patient/therapist safety;To address functional/ADL transfers;Complexity of the patient's impairments (multi-system involvement) PT goals addressed during session: Mobility/safety with mobility         AM-PAC PT "6 Clicks" Mobility  Outcome Measure Help needed turning from your back to your side while in a flat bed without using bedrails?: Total Help needed moving from lying on your back to sitting on the side of a flat bed without using bedrails?: Total Help needed moving to and from a bed to a chair (including a wheelchair)?: Total Help needed standing up from a chair using your arms (e.g., wheelchair or bedside chair)?: Total Help needed to walk in hospital room?: Total Help needed climbing 3-5 steps with a railing? : Total 6 Click Score: 6    End of Session   Activity Tolerance: Patient limited by pain Patient left: in bed;with call bell/phone within reach;with family/visitor present Nurse Communication: Mobility status PT Visit Diagnosis: Other abnormalities of gait and mobility (R26.89);Hemiplegia and hemiparesis Hemiplegia - Right/Left: Right Hemiplegia - caused by: Cerebral infarction  Time: 2774-1287 PT Time Calculation (min) (ACUTE ONLY): 37 min   Charges:   PT Evaluation $PT Eval Moderate Complexity: 1 Mod          Lillia Pauls, PT, DPT Acute Rehabilitation Services Office [phone]   Norval Morton 11/20/2022, 10:16 AM

## 2022-11-20 NOTE — Progress Notes (Signed)
[facility] Progress Note Patient Name: Samantha [name] DOB: [birth year] MRN: [id number]   Date of Service  11/20/2022  HPI/Events of Note  Irregular rhythm on tele. Suspect new onset AF with intermittent HR 150s per RN. Spontaneously resolves but will occur with stimulation  Hemodynamically stable and HR currently <110s   eICU Interventions  Advised RN to obtain EKG to document arrhythmia     Intervention Category Intermediate Interventions: Arrhythmia - evaluation and management Evaluation Type: New Patient Evaluation  Sayda Grable Rodman Pickle 11/20/2022, 4:25 AM

## 2022-11-20 NOTE — Progress Notes (Signed)
Peripherally Inserted Central Catheter Placement  The IV Nurse has discussed with the patient and/or persons authorized to consent for the patient, the purpose of this procedure and the potential benefits and risks involved with this procedure.  The benefits include less needle sticks, lab draws from the catheter, and the patient may be discharged home with the catheter. Risks include, but not limited to, infection, bleeding, blood clot (thrombus formation), and puncture of an artery; nerve damage and irregular heartbeat and possibility to perform a PICC exchange if needed/ordered by physician.  Alternatives to this procedure were also discussed.  Bard Power PICC patient education guide, fact sheet on infection prevention and patient information card has been provided to patient /or left at bedside.    PICC Placement Documentation  PICC Triple Lumen 11/20/22 Left Brachial 43 cm 0 cm (Active)  Indication for Insertion or Continuance of Line Limited venous access - need for IV therapy >5 days (PICC only) 11/20/22 1312  Exposed Catheter (cm) 1 cm 11/20/22 1312  Site Assessment Clean, Dry, Intact 11/20/22 1312  Lumen #1 Status Flushed;Saline locked;Blood return noted 11/20/22 1312  Lumen #2 Status Flushed;Saline locked;Blood return noted 11/20/22 1312  Lumen #3 Status Flushed;Saline locked;Blood return noted 11/20/22 1312  Dressing Type Transparent;Securing device 11/20/22 1312  Dressing Status Antimicrobial disc in place 11/20/22 1312  Dressing Intervention New dressing;Other (Comment) 11/20/22 1312  Dressing Change Due 11/27/22 11/20/22 1312   Husband signed consent at bedside    Synthia Innocent 11/20/2022, 1:14 PM

## 2022-11-20 NOTE — Telephone Encounter (Signed)
Spoke to [facility]--ok verbal order per [place], [place].

## 2022-11-20 NOTE — Progress Notes (Signed)
[name], MRN:  [id number], DOB:  10/21/1956, LOS: 1 ADMISSION DATE:  12-07-22 CONSULTATION DATE:  11/19/2022 REFERRING MD:  Samantha [name] - EDP CHIEF COMPLAINT:  AMS, elevated lactic acid  History of Present Illness:  67 year old woman who presented to [facility] ED 2022-12-07 via EMS for AMS. PMHx significant for HTN, HLD, T2DM, CVA with R-sided deficits, LLE DVT (12/2021, previously on Eliquis), depression.  Per chart review, patient was seen by her home health nurse on date of admission who was concerned that patient was dehydrated. Additionally, husband reported onset of acute confusion noted 2 days PTA (progressively worsening) as well as nausea/vomiting.   On ED arrival, normothermic, tachycardic to 110s, normotensive (intermittently soft BP with SBP no lower than 90s), tachypneic to 20s with SpO2 100%. GCS 12 with baseline R-sided deficits. Labs were notable for WBC 25.2, Hgb 11.5 (baseline), Plt 94 (baseline ~200), INR 1.5. Na 138, K 5.4, CO2 21, Cr 4.12 (baseline 1.2), BUN 48; albumin undetectably low at < 1.5. AST 63, ALT 43, AP 170, Tbili 1.9. Trop 213 > 33. UA with large Hgb, mod bili, protein, mod leuks, many bacteria. LA uptrending 5.3 > 6.3 > now  greater than 9.0. CT Head NAICA. CT Chest/A/P with extensive subcutaneous gas/debris within L perineal soft tissue, tracking along posterior L labia majora/perianal subcutaneous tissue with surrounding inflammation, no loculated fluid collection. Broad-spectrum cefepime/vanc/flagyl was initiated as well as aggressive fluid resuscitation. CCS consulted.  PCCM consulted for ICU admission in the setting of developing sepsis, persistent lactic elevation.  Pertinent Medical History:   Past Medical History:  Diagnosis Date   CVA (cerebral vascular accident) ([place])    R-sided deficits   Deep vein thrombosis (DVT) ([place])    Previously on Eliquis   HTN (hypertension)    Hyperlipidemia    T2DM (type 2 diabetes mellitus) ([place])    Significant [name]  Events: Including procedures, antibiotic start and stop dates in addition to other pertinent events   12-07-2022 - Presented to Samantha [name] for AMS x 2 days. Elevated WBC, soft BP, dirty UA with frank pus on I&O cath. CT Head NAICA, CT A/P as above with concern for perianal ST infection. CCS consulted. Lactate uptrending > 9.0. PCCM consulted for ICU admission.  Interim History / Subjective:  Husband thinks she is uncomfortable, moaning constantly.  Objective:  Blood pressure 120/88, pulse 96, temperature 97.8 F (36.6 C), temperature source Axillary, resp. rate 14, height 5' (1.524 m), weight 90.8 kg, SpO2 97 %.        Intake/Output Summary (Last 24 hours) at 11/20/2022 1045 Last data filed at 11/20/2022 1000 Gross per 24 hour  Intake 4047.2 ml  Output 147 ml  Net 3900.2 ml    Filed Weights   12/07/22 2236 11/19/22 0544 11/20/22 0500  Weight: 125.2 kg 86.6 kg 90.8 kg   Physical Examination: General: chronically ill appearing woman sitting up in bed, moaning constantly. HEENT: [place]/AT, eyes anicteric Neuro: awake, nonverbal but moaning. Moving L side, not R. CV: S1S2, RRR PULM: breathing comfortably on [place], CTAB GI: soft, NT GU perineal wounds not examined, foley in place Extremities: RLE edema worse than left.  Skin: warm, dry, no diffuse rashes. Wounds not examined .  BUN 43 Creatinine 3.31 WBC 24.9 H/H 8.6/25.2 Platelets 53 CXR personally reviewed- no infiltrates  Urine culture-greater than 100,000 colonies GNR Blood cultures 1/4 staph capitis  Resolved [name] Problem List:    Assessment & Plan:  Sepsis, suspect urologic source versus progressive SSTI Lactic acidosis -  additional IVF -recheck LA -PICC line for difficult IV access -antibiotics- linezolid and cefepime -adding anaerobic coverage- flagyl  Skin and soft tissue infection of perineum- extensive subcutaneous gas/debris within L perineal soft tissue, tracking along posterior L labia majora/perianal subcutaneous  tissue with surrounding inflammation, no loculated fluid collection. Wound expanding today. -Appreciate wound care and surgical teams' management> surgery signed off but recommended I&D by Gynecology. - Continue broad-spectrum antibiotics  Altered mental status, likely in the setting of UTI. Baseline is nonverbal since stroke. -antibiotics, supportive care -husband at bedside -pain control-- adding oxycodone and fentanyl PRN   Acute renal failure versus severe AKI, ?prerenal with dehydration from GI losses, ?postrenal due to UTI Mild hyperkalemia- reoslved -strict I/O -renally dose meds, avoid nephrotoxic meds -LR bolus, maintenance fluids due to concern for high losses with wounds -foley  Hypomagnesemia -repleted  Acute thrombocytopenia- likely 2/2 sepsis and antibiotics -monitor, may need to stop DVT prophylaxis if continues to drop  History of CVA with R-sided deficits Hypertension Hyperlipidemia - hold PTA antihypertensives -statin -PT, OT; baseline R-sided deficits  History of LLE DVT -LE [place] on R >>> confirmed DVT is present in CFV. Will need to hold Genesis [name] currently until platelets improve, especially with concern for need for additional wound debridement/ I&D.  R gluteal pressure wound, POA -appreciate wound care team and surgery's management  T2DM with hyperglycemia, uncontrolled -con't SSI- increase scale to moderate -goal BG 140-180  Depression -PTA lexapro  At risk for malnutrition -advancing TF  Best Practice: (right click and "Reselect all SmartList Selections" daily)   Diet/type: NPO DVT prophylaxis: SCDs, SQH GI prophylaxis: N/A Lines: N/A Foley:  Yes, and it is still needed Code Status:  full code Last date of multidisciplinary goals of care discussion [Pending]  Labs:  CBC: Recent Labs  Lab 11/25/2022 1925 11/19/22 0623 11/20/22 0721  WBC 25.2* 26.3* 24.9*  NEUTROABS 23.5*  --   --   HGB 11.5* 10.6* 8.6*  HCT 35.2* 32.6* 25.2*  MCV 110.0*  108.7* 105.0*  PLT 94* 63* 53*    Basic Metabolic Panel: Recent Labs  Lab 11/19/22 0623 11/19/22 0909 11/19/22 1603 11/19/22 2237 11/20/22 0721  NA 139 139 137 134* 136  K 5.2* 5.8* 4.5 4.5 4.0  CL 102 104 103 102 103  CO2 17* 16* 22 21* 23  GLUCOSE 193* 155* 214* 231* 195*  BUN 46* 47* 43* 44* 43*  CREATININE 3.82* 3.88* 3.50* 3.40* 3.31*  CALCIUM 8.5* 8.3* 8.5* 8.3* 8.5*  MG 1.9 1.9  --   --  1.6*  PHOS 4.3 4.2  --   --  2.6      Critical care time:     This patient is critically ill with multiple organ system failure which requires frequent high complexity decision making, assessment, support, evaluation, and titration of therapies. This was completed through the application of advanced monitoring technologies and extensive interpretation of multiple databases. During this encounter critical care time was devoted to patient care services described in this note for 40 minutes.  Julian Hy, DO 11/20/22 10:45 AM [facility]  For contact information, see Amion. If no response to pager, please call PCCM consult pager. After hours, 7PM- 7AM, please call Elink.

## 2022-11-20 NOTE — Evaluation (Signed)
Occupational Therapy Evaluation Patient Details Name: Meganne Rita MRN: [id number] DOB: 04-16-1956 Today's Date: 11/20/2022   History of Present Illness Pt is a 67 y.o. F who presents 11/19/2022 in the setting of developing sepsis and persistent lactic elevation. CT Chest/A/P with extensive subcutaneous gas/debris within L perineal soft tissue, tracking along posterior L labia majora/perianal subcutaneous tissue with surrounding inflammation, no loculated fluid collection.Significant PMH: HTN, HLD, T2DM, CVA with R-sided deficits, LLE DVT (12/2021, previously on Eliquis), depression.   Clinical Impression   Samantha [name] was evaluated s/p the above admission list, pt seen with PT for safety. Pt presented with impaired communication, husband present and provided PLOF and home set up. Pt requires assist for all ADLs and mobility at baseline. Upon evaluation she was limited by communication, pain, baseline R hemiparesis, multiple wounds poor activity tolerance and limited active participation. Overall she is currently dependent in all aspects of her care at bed level with +2 asisst OT to continue to follow acutely. Recommend d/c to SNF for continued therapy and safety.    Recommendations for follow up therapy are one component of a multi-disciplinary discharge planning process, led by the attending physician.  Recommendations may be updated based on patient status, additional functional criteria and insurance authorization.   Follow Up Recommendations  Long-term institutional care without follow-up therapy     Assistance Recommended at Discharge Frequent or constant Supervision/Assistance  Patient can return home with the following A lot of help with walking and/or transfers;A lot of help with bathing/dressing/bathroom;Direct supervision/assist for medications management;Direct supervision/assist for financial management;Help with stairs or ramp for entrance;Assist for transportation    Functional Status  Assessment  Patient has had a recent decline in their functional status and demonstrates the ability to make significant improvements in function in a reasonable and predictable amount of time.  Equipment Recommendations  Other (comment) (defer)       Precautions / Restrictions Precautions Precautions: Fall;Other (comment) Precaution Comments: NGT, chronic R hemiparesis, multiple wounds Restrictions Weight Bearing Restrictions: No      Mobility Bed Mobility Overal bed mobility: Needs Assistance Bed Mobility: Rolling Rolling: Total assist, +2 for physical assistance, +2 for safety/equipment         General bed mobility comments: pt not initiating or assisting in bed mobility    Transfers                   General transfer comment: deferred          ADL either performed or assessed with clinical judgement   ADL Overall ADL's : Needs assistance/impaired Eating/Feeding: NPO Eating/Feeding Details (indicate cue type and reason): cortrak                                   General ADL Comments: dependent for all aspects of care at bed level and +2 assist     Vision Baseline Vision/History: 0 No visual deficits Vision Assessment?: Vision impaired- to be further tested in functional context Additional Comments: L inattention, able to attend to L stimuli with significant cues and no distractions to the R. Would benefit form further assessment     Perception Perception Perception Tested?: No   Praxis Praxis Praxis tested?: Not tested    Pertinent Vitals/Pain Pain Assessment Pain Location: R side, generalized with movement Pain Descriptors / Indicators: Grimacing, Other (Comment) Pain Intervention(s): Limited activity within patient's tolerance, Monitored during session     Hand Dominance  Right   Extremity/Trunk Assessment Upper Extremity Assessment Upper Extremity Assessment: RUE deficits/detail;LUE deficits/detail RUE Deficits / Details:  no active movement, rigid. no response to noxious on digits. very limited digit flexion. pt's husband reports she normally wears a wrist cock up RUE Sensation: decreased light touch;decreased proprioception RUE Coordination: decreased fine motor;decreased gross motor LUE Deficits / Details: limited assessment, tight at digits, elbow and shoulder. no purposeful movement noted. LUE Coordination: decreased fine motor;decreased gross motor   Lower Extremity Assessment Lower Extremity Assessment: Defer to PT evaluation RLE Deficits / Details: No active movement noted, ankle dorsiflexion to neutral. DPTI on heel LLE Deficits / Details: Ankle dorsiflexion to neutral, increased quad tightness. Wiggling toes sponteously   Cervical / Trunk Assessment Cervical / Trunk Assessment: Kyphotic;Other exceptions Cervical / Trunk Exceptions: multiple wounds   Communication Communication Communication: Receptive difficulties;Expressive difficulties   Cognition Arousal/Alertness: Awake/alert Behavior During Therapy: Flat affect Overall Cognitive Status: History of cognitive impairments - at baseline                                 General Comments: Pt husband reporting pt verbal and following commands at home, however, pt following ~5% of 1 step commands and no attempts to verbalize this session. Likely internally distracted by pain and discomfort as well     General Comments  husband present and provided PLOF and home set up. pt with multiple wounds            Home Living Family/patient expects to be discharged to:: Private residence Living Arrangements: Spouse/significant other Available Help at Discharge: Family;Available 24 hours/day Type of Home: House Home Access: Ramped entrance     Home Layout: One level     Bathroom Shower/Tub: Occupational psychologist: Standard Bathroom Accessibility: Yes   Home Equipment: Conservation officer, nature (2 wheels);BSC/3in1;Wheelchair -  manual          Prior Functioning/Environment Prior Level of Function : Needs assist             Mobility Comments: pt requiring assist for stand pivot transfers to toilet/wheelchair/car. increasingly bed bound in several days leading up to hospital admission ADLs Comments: requiring assist for ADL's, feeds self intermittently, decreased PO intake in days leading up to admission        OT Problem List: Decreased strength;Decreased range of motion;Decreased activity tolerance;Impaired balance (sitting and/or standing);Decreased coordination;Decreased cognition;Decreased safety awareness;Decreased knowledge of precautions;Decreased knowledge of use of DME or AE;Impaired tone;Impaired sensation;Impaired UE functional use      OT Treatment/Interventions: Self-care/ADL training;Therapeutic exercise;Neuromuscular education;DME and/or AE instruction;Therapeutic activities;Patient/family education;Balance training    OT Goals(Current goals can be found in the care plan section) Acute Rehab OT Goals Patient Stated Goal: unable to state. per husband for pt to eat OT Goal Formulation: With patient/family Time For Goal Achievement: 12/04/22 Potential to Achieve Goals: Fair ADL Goals Pt Will Perform Grooming: with mod assist;sitting Pt Will Perform Upper Body Dressing: with mod assist;sitting Pt Will Transfer to Toilet: with max assist;stand pivot transfer;bedside commode Additional ADL Goal #1: pt will complete bed mobility with mod A as a precursor to ADLs  OT Frequency: Min 2X/week    Co-evaluation PT/OT/SLP Co-Evaluation/Treatment: Yes Reason for Co-Treatment: Complexity of the patient's impairments (multi-system involvement);For patient/therapist safety;To address functional/ADL transfers PT goals addressed during session: Mobility/safety with mobility OT goals addressed during session: ADL's and self-care      AM-PAC OT "6 Clicks" Daily  Activity     Outcome Measure Help from  another person eating meals?: Total Help from another person taking care of personal grooming?: Total Help from another person toileting, which includes using toliet, bedpan, or urinal?: Total Help from another person bathing (including washing, rinsing, drying)?: Total Help from another person to put on and taking off regular upper body clothing?: Total Help from another person to put on and taking off regular lower body clothing?: Total 6 Click Score: 6   End of Session Nurse Communication: Mobility status  Activity Tolerance: Patient limited by pain Patient left: in bed;with call bell/phone within reach;with bed alarm set  OT Visit Diagnosis: Unsteadiness on feet (R26.81);Other abnormalities of gait and mobility (R26.89);History of falling (Z91.81);Muscle weakness (generalized) (M62.81);Ataxia, unspecified (R27.0);Hemiplegia and hemiparesis Hemiplegia - Right/Left: Right Hemiplegia - caused by: Cerebral infarction                Time: 5409-8119 OT Time Calculation (min): 35 min Charges:  OT General Charges $OT Visit: 1 Visit OT Evaluation $OT Eval Moderate Complexity: 1 Mod   Soundra Lampley D Causey 11/20/2022, 11:01 AM

## 2022-11-20 NOTE — Progress Notes (Signed)
Progress Note     Subjective: Pt non-verbal. Evaluated draining labial abscess with bedside RN.   Objective: Vital signs in last 24 hours: Temp:  [97.5 F (36.4 C)-97.9 F (36.6 C)] 97.8 F (36.6 C) (01/23 0409) Pulse Rate:  [80-115] 95 (01/23 1100) Resp:  [5-23] 20 (01/23 1100) BP: (101-136)/(49-92) 116/69 (01/23 1100) SpO2:  [90 %-99 %] 98 % (01/23 1100) Weight:  [90.8 kg] 90.8 kg (01/23 0500) Last BM Date :  (PTA)  Intake/Output from previous day: 01/22 0701 - 01/23 0700 In: 5731.4 [I.V.:4035.1; NG/GT:347.7; IV Piggyback:1348.6] Out: 155 [Urine:155] Intake/Output this shift: Total I/O In: 962.6 [I.V.:410.6; NG/GT:225; IV Piggyback:327] Out: 0   PE: General:chronically ill appearing female in bed in NAD Heart: regular, rate, and rhythm.   Lungs:  Respiratory effort nonlabored Abd: soft, NT, ND GU: foley present with straw colored urine, left posterior labial abscess actively draining purulent material, surrounding induration but does not seem to track into perineum   Lab Results:  Recent Labs    11/19/22 0623 11/20/22 0721  WBC 26.3* 24.9*  HGB 10.6* 8.6*  HCT 32.6* 25.2*  PLT 63* 53*   BMET Recent Labs    11/19/22 2237 11/20/22 0721  NA 134* 136  K 4.5 4.0  CL 102 103  CO2 21* 23  GLUCOSE 231* 195*  BUN 44* 43*  CREATININE 3.40* 3.31*  CALCIUM 8.3* 8.5*   PT/INR Recent Labs    11/09/2022 1925  LABPROT 18.0*  INR 1.5*   CMP     Component Value Date/Time   NA 136 11/20/2022 0721   NA 145 (H) 08/13/2022 1116   K 4.0 11/20/2022 0721   CL 103 11/20/2022 0721   CO2 23 11/20/2022 0721   GLUCOSE 195 (H) 11/20/2022 0721   BUN 43 (H) 11/20/2022 0721   BUN 8 08/13/2022 1116   CREATININE 3.31 (H) 11/20/2022 0721   CALCIUM 8.5 (L) 11/20/2022 0721   PROT 5.5 (L) 11/20/2022 0721   PROT 6.9 08/13/2022 1116   ALBUMIN 1.8 (L) 11/20/2022 0721   ALBUMIN 3.0 (L) 08/13/2022 1116   AST 40 11/20/2022 0721   ALT 33 11/20/2022 0721   ALKPHOS 155 (H)  11/20/2022 0721   BILITOT 1.1 11/20/2022 0721   BILITOT 0.4 08/13/2022 1116   GFRNONAA 15 (L) 11/20/2022 0721   GFRAA 84 11/18/2019 1607   Lipase  No results found for: "LIPASE"     Studies/Results: DG Chest Port 1 View  Result Date: 11/20/2022 CLINICAL DATA:  Shortness of breath EXAM: PORTABLE CHEST 1 VIEW COMPARISON:  Chest x-ray dated November 22, 2022 FINDINGS: Feeding tube tip is in the stomach. The heart size and mediastinal contours are within normal limits. Both lungs are clear. The visualized skeletal structures are unremarkable. IMPRESSION: No active disease. Feeding tube tip is in the stomach. Electronically Signed   By: Allegra Lai M.D.   On: 11/20/2022 08:21   DG Abd Portable 1V  Result Date: 11/19/2022 CLINICAL DATA:  Encounter for feeding tube placement EXAM: PORTABLE ABDOMEN - 1 VIEW COMPARISON:  None Available. FINDINGS: Feeding tube with tip in the gastric fundus.  No bowel obstruction. IMPRESSION: Feeding tube in stomach as above Electronically Signed   By: Genevive Bi M.D.   On: 11/19/2022 16:22   DG Abd Portable 1V  Result Date: 11/19/2022 CLINICAL DATA:  Feeding tube placement EXAM: PORTABLE ABDOMEN - 1 VIEW COMPARISON:  Abdominal radiograph dated January 27, 2022 FINDINGS: Feeding tube tip is in the stomach. Visualized bowel  gas pattern is normal. No radio-opaque calculi or other significant radiographic abnormality are seen. IMPRESSION: Feeding tube tip is in the stomach. Electronically Signed   By: Yetta Glassman M.D.   On: 11/19/2022 15:30   US RENAL  Result Date: 11/19/2022 CLINICAL DATA:  Oliguria EXAM: RENAL / URINARY TRACT ULTRASOUND COMPLETE COMPARISON:  01/14/2022 renal ultrasound FINDINGS: Right Kidney: Renal measurements: 12.1 x 5.4 x 5.4 cm = volume: 182 mL, previously 230 mL. Echogenicity within normal limits. No mass or hydronephrosis visualized. Left Kidney: Renal measurements: 8.3 x 4.3 x 3.2 cm = volume: 60 mL, previously 100 mL. The kidney  is small and increased in echogenicity, with a contour suggestive of scarring. No mass or hydronephrosis visualized. Bladder: The bladder contains a Foley and is decompressed. Other: None. IMPRESSION: 1. Left renal scarring and atrophy, which has progressed from the prior exam 2. No acute abnormality in the right kidney. Electronically Signed   By: Merilyn Baba M.D.   On: 11/19/2022 11:38   CT CHEST ABDOMEN PELVIS WO CONTRAST  Result Date: 11/19/2022 CLINICAL DATA:  Sepsis EXAM: CT CHEST, ABDOMEN AND PELVIS WITHOUT CONTRAST TECHNIQUE: Multidetector CT imaging of the chest, abdomen and pelvis was performed following the standard protocol without IV contrast. RADIATION DOSE REDUCTION: This exam was performed according to the departmental dose-optimization program which includes automated exposure control, adjustment of the mA and/or kV according to patient size and/or use of iterative reconstruction technique. COMPARISON:  None Available. FINDINGS: CT CHEST FINDINGS Cardiovascular: Moderate multi-vessel coronary artery calcification. Global cardiac size within normal limits. No pericardial effusion. Central pulmonary arteries are enlarged in keeping with changes of pulmonary arterial hypertension. Mild atherosclerotic calcification within the thoracic aorta. No aortic aneurysm. Mediastinum/Nodes: No enlarged mediastinal, hilar, or axillary lymph nodes. Thyroid gland, trachea, and esophagus demonstrate no significant findings. Lungs/Pleura: Lungs are clear. No pleural effusion or pneumothorax. Musculoskeletal: No acute bone abnormality. No lytic or blastic bone lesion. CT ABDOMEN PELVIS FINDINGS Hepatobiliary: No focal liver abnormality is seen. No gallstones, gallbladder wall thickening, or biliary dilatation. Pancreas: Unremarkable. No pancreatic ductal dilatation or surrounding inflammatory changes. Spleen: Unremarkable Adrenals/Urinary Tract: The adrenal glands are unremarkable. The kidneys are normal in  position. Marked left renal cortical scarring and atrophy. The right kidney is normal in size. Nonobstructing calculi are noted within the lower pole of the kidneys bilaterally measuring up to 7 mm on the left and 4 mm on the right. Additional vascular calcifications noted within the renal hila. No hydronephrosis. No ureteral calculi. The bladder is unremarkable. Stomach/Bowel: The stomach, small bowel, and large bowel are unremarkable. Appendix normal. No free intraperitoneal gas or fluid. Vascular/Lymphatic: Mild aortoiliac atherosclerotic calcification. No aortic aneurysm. No pathologic adenopathy within the abdomen and pelvis. Reproductive: Uterus and bilateral adnexa are unremarkable. Other: There is extensive subcutaneous gas seen within the left peroneal soft tissues along the posterior aspect of the left labial majora and within the perianal subcutaneous fat. There is superimposed debris within this region best appreciated on image # 125/3 without discrete loculated fluid collection identified. The subcutaneous gas, suggestive of an aggressive soft tissue infection, extends to the left levator ani musculature, but does not appear to extend into the deep pelvis. Additionally there is extension of subcutaneous gas and inflammatory changes superficial to the sacrum to the level of the iliac spines bilaterally. There is asymmetric extensive subcutaneous edema within the a dependent subcutaneous fat of the proximal right thigh which may relate to dependent positioning or a localized inflammatory process. No  loculated fluid collection or subcutaneous gas noted in this region. Musculoskeletal: No acute bone abnormality. No lytic or blastic bone lesion. IMPRESSION: 1. Extensive subcutaneous gas and debris within the left perineal soft tissues along the posterior aspect of the left labia majora and within the perianal subcutaneous in keeping with an aggressive soft tissue infection. Inflammatory change extends to the  left levator ani musculature, but does not appear to extend into the deep pelvis, and extends superficial to the sacrum to the level of the iliac spines. No loculated subcutaneous fluid collection identified. 2. Asymmetric extensive subcutaneous edema within the dependent subcutaneous fat of the proximal right thigh which may relate to dependent positioning or a localized inflammatory process. No loculated fluid collection or subcutaneous gas noted in this region. 3. Moderate multi-vessel coronary artery calcification. 4. Morphologic changes in keeping with pulmonary arterial hypertension. 5. Marked left renal cortical scarring and atrophy. Mild bilateral nonobstructing nephrolithiasis. 6.  Aortic Atherosclerosis (ICD10-I70.0). Electronically Signed   By: Fidela Salisbury M.D.   On: 11/19/2022 00:29   CT HEAD WO CONTRAST (5MM)  Result Date: 11/19/2022 CLINICAL DATA:  Altered mental status EXAM: CT HEAD WITHOUT CONTRAST TECHNIQUE: Contiguous axial images were obtained from the base of the skull through the vertex without intravenous contrast. RADIATION DOSE REDUCTION: This exam was performed according to the departmental dose-optimization program which includes automated exposure control, adjustment of the mA and/or kV according to patient size and/or use of iterative reconstruction technique. COMPARISON:  None Available. FINDINGS: Brain: There is no mass, hemorrhage or extra-axial collection. There is generalized atrophy without lobar predilection. Hypodensity of the white matter is most commonly associated with chronic microvascular disease. 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 Vascular: No abnormal hyperdensity of the major intracranial arteries or dural venous sinuses. No intracranial atherosclerosis. Skull: The visualized skull base, calvarium and extracranial soft tissues are normal. Sinuses/Orbits: No fluid levels or advanced mucosal thickening of the visualized  paranasal sinuses. No mastoid or middle ear effusion. The orbits are normal. IMPRESSION: 1. No acute intracranial abnormality. 2. 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 3. Chronic microvascular ischemia. Electronically Signed   By: Ulyses Jarred M.D.   On: 11/19/2022 00:13   DG Chest Port 1 View  Result Date: 11/15/2022 CLINICAL DATA:  Sepsis, became nonverbal, dehydrated EXAM: PORTABLE CHEST 1 VIEW COMPARISON:  Portable exam 1951 hours compared to 04/13/2022 FINDINGS: Normal heart size, mediastinal contours, and pulmonary vascularity. Atherosclerotic calcification aorta. Lungs clear. No infiltrate, pleural effusion, or pneumothorax. No acute osseous findings. IMPRESSION: No acute abnormalities. Aortic Atherosclerosis (ICD10-I70.0). Electronically Signed   By: Lavonia Dana M.D.   On: 11/11/2022 20:15    Anti-infectives: Anti-infectives (From admission, onward)    Start     Dose/Rate Route Frequency Ordered Stop   11/19/22 2000  ceFEPIme (MAXIPIME) 2 g in sodium chloride 0.9 % 100 mL IVPB        2 g 200 mL/hr over 30 Minutes Intravenous Every 24 hours 11/11/2022 2215     11/19/22 1400  linezolid (ZYVOX) IVPB 600 mg        600 mg 300 mL/hr over 60 Minutes Intravenous Every 12 hours 11/19/22 1306     11/19/22 0730  clindamycin (CLEOCIN) IVPB 600 mg  Status:  Discontinued       Note to Pharmacy: Dose accordingly for necrotizing fascitis   600 mg 100 mL/hr over 30 Minutes Intravenous Every 6 hours 11/19/22 0636 11/19/22 1306  11/12/2022 2215  vancomycin variable dose per unstable renal function (pharmacist dosing)  Status:  Discontinued         Does not apply See admin instructions 11/15/2022 2215 11/19/22 1306   11/24/2022 1945  aztreonam (AZACTAM) 2 g in sodium chloride 0.9 % 100 mL IVPB  Status:  Discontinued        2 g 200 mL/hr over 30 Minutes Intravenous  Once 11/16/2022 1938 11/15/2022 1944   11/08/2022 1945  metroNIDAZOLE (FLAGYL) IVPB 500 mg        500 mg 100 mL/hr over 60  Minutes Intravenous  Once 11/17/2022 1938 11/27/2022 2115   11/27/2022 1945  vancomycin (VANCOCIN) IVPB 1000 mg/200 mL premix        1,000 mg 200 mL/hr over 60 Minutes Intravenous  Once 11/06/2022 1938 10/31/2022 2116   11/28/2022 1945  ceFEPIme (MAXIPIME) 2 g in sodium chloride 0.9 % 100 mL IVPB        2 g 200 mL/hr over 30 Minutes Intravenous  Once 11/16/2022 1944 11/25/2022 2047        Assessment/Plan  Left posterior labial abscess - continues to have active drainage from left labia, small amount of surrounding induration - no crepitus or concern for NSTI at this time - does not appear to extend to perineum - recommend continued abx - if concern for worsening infection, consider GYN consult for I&D of labial abscess - general surgery will sign off at this time, please call if we can be of further assistance  FEN:TF @ 55 cc/h VTE: SQH ID: cefepime, linezolid  - per CCM - Urosepsis AKI on CKD stage IIIa HTN HLD Hx of CVA with R sided deficits LLE DVT 12/2021 on Eliquis T2DM Severe protein calorie malnutrition Depression   LOS: 1 day   I reviewed last 24 h vitals and pain scores, last 48 h intake and output, last 24 h labs and trends, last 24 h imaging results, and critical care notes .   Juliet Rude, [facility] 11/20/2022, 11:10 AM Please see Amion for pager number during day hours 7:00am-4:30pm

## 2022-11-21 ENCOUNTER — Encounter (HOSPITAL_COMMUNITY): Payer: Self-pay | Admitting: Pulmonary Disease

## 2022-11-21 DIAGNOSIS — R739 Hyperglycemia, unspecified: Secondary | ICD-10-CM | POA: Diagnosis not present

## 2022-11-21 DIAGNOSIS — G9340 Encephalopathy, unspecified: Secondary | ICD-10-CM

## 2022-11-21 DIAGNOSIS — R4701 Aphasia: Secondary | ICD-10-CM | POA: Diagnosis not present

## 2022-11-21 DIAGNOSIS — R6520 Severe sepsis without septic shock: Secondary | ICD-10-CM | POA: Diagnosis not present

## 2022-11-21 DIAGNOSIS — A4151 Sepsis due to Escherichia coli [E. coli]: Secondary | ICD-10-CM

## 2022-11-21 LAB — COMPREHENSIVE METABOLIC PANEL
ALT: 27 U/L (ref 0–44)
AST: 30 U/L (ref 15–41)
Albumin: <1.5 g/dL — ABNORMAL LOW (ref 3.5–5.0)
Alkaline Phosphatase: 120 U/L (ref 38–126)
Anion gap: 7 (ref 5–15)
BUN: 40 mg/dL — ABNORMAL HIGH (ref 8–23)
CO2: 23 mmol/L (ref 22–32)
Calcium: 8.1 mg/dL — ABNORMAL LOW (ref 8.9–10.3)
Chloride: 106 mmol/L (ref 98–111)
Creatinine, Ser: 2.7 mg/dL — ABNORMAL HIGH (ref 0.44–1.00)
GFR, Estimated: 19 mL/min — ABNORMAL LOW (ref >60)
Glucose, Bld: 140 mg/dL — ABNORMAL HIGH (ref 70–99)
Potassium: 3.3 mmol/L — ABNORMAL LOW (ref 3.5–5.1)
Sodium: 136 mmol/L (ref 135–145)
Total Bilirubin: 0.9 mg/dL (ref 0.3–1.2)
Total Protein: 4.4 g/dL — ABNORMAL LOW (ref 6.5–8.1)

## 2022-11-21 LAB — CBC
HCT: 22.4 % — ABNORMAL LOW (ref 36.0–46.0)
Hemoglobin: 7.6 g/dL — ABNORMAL LOW (ref 12.0–15.0)
MCH: 35.8 pg — ABNORMAL HIGH (ref 26.0–34.0)
MCHC: 33.9 g/dL (ref 30.0–36.0)
MCV: 105.7 fL — ABNORMAL HIGH (ref 80.0–100.0)
Platelets: 35 10*3/uL — ABNORMAL LOW (ref 150–400)
RBC: 2.12 MIL/uL — ABNORMAL LOW (ref 3.87–5.11)
RDW: 16.5 % — ABNORMAL HIGH (ref 11.5–15.5)
WBC: 22.7 10*3/uL — ABNORMAL HIGH (ref 4.0–10.5)
nRBC: 0.1 % (ref 0.0–0.2)

## 2022-11-21 LAB — GLUCOSE, CAPILLARY
Glucose-Capillary: 113 mg/dL — ABNORMAL HIGH (ref 70–99)
Glucose-Capillary: 139 mg/dL — ABNORMAL HIGH (ref 70–99)
Glucose-Capillary: 144 mg/dL — ABNORMAL HIGH (ref 70–99)
Glucose-Capillary: 145 mg/dL — ABNORMAL HIGH (ref 70–99)
Glucose-Capillary: 188 mg/dL — ABNORMAL HIGH (ref 70–99)
Glucose-Capillary: 194 mg/dL — ABNORMAL HIGH (ref 70–99)

## 2022-11-21 LAB — ABO/RH: ABO/RH(D): A POS

## 2022-11-21 LAB — MAGNESIUM: Magnesium: 1.8 mg/dL (ref 1.7–2.4)

## 2022-11-21 LAB — PHOSPHORUS: Phosphorus: 1.5 mg/dL — ABNORMAL LOW (ref 2.5–4.6)

## 2022-11-21 MED ORDER — PROPOFOL 10 MG/ML IV BOLUS
INTRAVENOUS | Status: AC
  Filled 2022-11-21: qty 20

## 2022-11-21 MED ORDER — POTASSIUM CHLORIDE 20 MEQ PO PACK: 40.0000 meq | PACK | Freq: Once | ORAL | Status: DC

## 2022-11-21 MED ORDER — SODIUM CHLORIDE 0.9% IV SOLUTION: Freq: Once | INTRAVENOUS | Status: AC

## 2022-11-21 MED ORDER — PROSOURCE TF20 ENFIT COMPATIBL EN LIQD
60.0000 mL | Freq: Every day | ENTERAL | Status: DC
  Administered 2022-11-24 – 2022-12-01 (×8): 60 mL
  Filled 2022-11-21 (×8): qty 60

## 2022-11-21 MED ORDER — ORAL CARE MOUTH RINSE: 15.0000 mL | Freq: Once | OROMUCOSAL | Status: DC

## 2022-11-21 MED ORDER — SODIUM CHLORIDE 0.9 % IV SOLN: INTRAVENOUS | Status: DC

## 2022-11-21 MED ORDER — LACTATED RINGERS IV BOLUS
500.0000 mL | Freq: Once | INTRAVENOUS | Status: AC
  Administered 2022-11-21: 500 mL via INTRAVENOUS

## 2022-11-21 MED ORDER — POTASSIUM PHOSPHATES 15 MMOLE/5ML IV SOLN
30.0000 mmol | Freq: Once | INTRAVENOUS | Status: AC
  Administered 2022-11-21: 30 mmol via INTRAVENOUS
  Filled 2022-11-21: qty 10

## 2022-11-21 MED ORDER — LACTATED RINGERS IV SOLN: INTRAVENOUS | Status: DC

## 2022-11-21 MED ORDER — CHLORHEXIDINE GLUCONATE 0.12 % MT SOLN
15.0000 mL | Freq: Once | OROMUCOSAL | Status: DC
  Filled 2022-11-21: qty 15

## 2022-11-21 MED ORDER — INSULIN ASPART 100 UNIT/ML IJ SOLN
0.0000 [IU] | INTRAMUSCULAR | Status: DC | PRN
  Administered 2022-11-21: 2 [IU] via SUBCUTANEOUS

## 2022-11-21 MED ORDER — MAGNESIUM SULFATE 2 GM/50ML IV SOLN
2.0000 g | Freq: Once | INTRAVENOUS | Status: AC
  Administered 2022-11-21: 2 g via INTRAVENOUS
  Filled 2022-11-21: qty 50

## 2022-11-21 MED ORDER — FENTANYL CITRATE (PF) 250 MCG/5ML IJ SOLN
INTRAMUSCULAR | Status: AC
  Filled 2022-11-21: qty 5

## 2022-11-21 NOTE — Anesthesia Preprocedure Evaluation (Addendum)
Anesthesia Evaluation  Patient identified by MRN, date of birth, ID band Patient awake    Reviewed: Allergy & Precautions, H&P , NPO status , Patient's Chart, lab work & pertinent test results, reviewed documented beta blocker date and time   History of Anesthesia Complications Negative for: history of anesthetic complications  Airway Mallampati: III  TM Distance: >3 FB Neck ROM: Full  Mouth opening: Limited Mouth Opening  Dental no notable dental hx. (+) Poor Dentition, Missing, Dental Advisory Given, Edentulous Upper,    Pulmonary neg pulmonary ROS   Pulmonary exam normal breath sounds clear to auscultation       Cardiovascular hypertension, Pt. on medications and Pt. on home beta blockers + DVT  Normal cardiovascular exam Rhythm:Regular Rate:Normal     Neuro/Psych CVA, Residual Symptoms negative neurological ROS  negative psych ROS   GI/Hepatic negative GI ROS, Neg liver ROS,,,  Endo/Other  diabetes, Type 2, Insulin Dependent  Morbid obesity  Renal/GU ARFRenal disease (Cr 2.7, BUN 40, K 3.3)negative Renal ROS  negative genitourinary   Musculoskeletal negative musculoskeletal ROS (+)    Abdominal   Peds negative pediatric ROS (+)  Hematology negative hematology ROS (+) Blood dyscrasia (Hgb 7.6), anemia   Anesthesia Other Findings Perineal Infection  Reproductive/Obstetrics negative OB ROS                             Anesthesia Physical Anesthesia Plan  ASA: 4 and emergent  Anesthesia Plan: General   Post-op Pain Management: Ofirmev IV (intra-op)*   Induction: Intravenous  PONV Risk Score and Plan: 3 and Treatment may vary due to age or medical condition, Midazolam, Dexamethasone and Ondansetron  Airway Management Planned: Oral ETT and LMA  Additional Equipment: None  Intra-op Plan:   Post-operative Plan: Extubation in OR  Informed Consent:   Plan Discussed with:  Anesthesiologist and CRNA  Anesthesia Plan Comments: ( )       Anesthesia Quick Evaluation

## 2022-11-21 NOTE — Progress Notes (Signed)
Subjective: Patient is non-communicative due to stroke history. She is more alert today.   Objective: I have reviewed patient's vital signs, medications, labs, and microbiology.  Vitals:   11/21/22 0800 11/21/22 0830 11/21/22 0900 11/21/22 1000  BP: 101/61  124/63 113/66  Pulse: 89     Resp: (!) 21  17 18   Temp:  97.6 F (36.4 C)    TempSrc:  Oral    SpO2: 92%     Weight:      Height:        General: no distress Resp: Normal appearing effort Cardio: Regular rate GI: soft, non-tender; bowel sounds normal; no masses,  no organomegaly GU: Not examined  Assessment/Plan: Appreciate general surgery's reevaluation. Will defer to their expertise and sign off. Please do not hesistate to re-consult if new issue arises.     LOS: 2 days    Radene Gunning, MD 11/21/2022, 11:20 AM

## 2022-11-21 NOTE — Progress Notes (Signed)
Progress Note     Subjective: Pt non-verbal this AM. Evaluated perineal wounds again after GYN saw yesterday evening and felt wound was tracking more perineally.   Objective: Vital signs in last 24 hours: Temp:  [97.4 F (36.3 C)-97.6 F (36.4 C)] 97.6 F (36.4 C) (01/24 0830) Pulse Rate:  [71-108] 89 (01/24 0800) Resp:  [8-23] 21 (01/24 0800) BP: (87-134)/(50-88) 101/61 (01/24 0800) SpO2:  [92 %-100 %] 92 % (01/24 0800) Weight:  [96.7 kg] 96.7 kg (01/24 0459) Last BM Date :  (PTA)  Intake/Output from previous day: 01/23 0701 - 01/24 0700 In: 5231.1 [I.V.:1939.5; NG/GT:1201.7; IV Piggyback:2090] Out: 184 [Urine:184] Intake/Output this shift: No intake/output data recorded.  PE: General:chronically ill appearing female in bed in NAD Heart: sinus tachycardia in the low 100s Lungs:  Respiratory effort nonlabored Abd: soft, NT, ND GU: foley present, left posterior labial abscess actively draining purulent material, this does appear to track more towards the rectum, normal rectal tone and abscess and rectum to not feel like they are connecting on exam today, surrounding induration without crepitus   Lab Results:  Recent Labs    11/20/22 0721 11/21/22 0252  WBC 24.9* 22.7*  HGB 8.6* 7.6*  HCT 25.2* 22.4*  PLT 53* 35*    BMET Recent Labs    11/20/22 0721 11/21/22 0252  NA 136 136  K 4.0 3.3*  CL 103 106  CO2 23 23  GLUCOSE 195* 140*  BUN 43* 40*  CREATININE 3.31* 2.70*  CALCIUM 8.5* 8.1*    PT/INR Recent Labs    11/05/2022 1925  LABPROT 18.0*  INR 1.5*    CMP     Component Value Date/Time   NA 136 11/21/2022 0252   NA 145 (H) 08/13/2022 1116   K 3.3 (L) 11/21/2022 0252   CL 106 11/21/2022 0252   CO2 23 11/21/2022 0252   GLUCOSE 140 (H) 11/21/2022 0252   BUN 40 (H) 11/21/2022 0252   BUN 8 08/13/2022 1116   CREATININE 2.70 (H) 11/21/2022 0252   CALCIUM 8.1 (L) 11/21/2022 0252   PROT 4.4 (L) 11/21/2022 0252   PROT 6.9 08/13/2022 1116    ALBUMIN <1.5 (L) 11/21/2022 0252   ALBUMIN 3.0 (L) 08/13/2022 1116   AST 30 11/21/2022 0252   ALT 27 11/21/2022 0252   ALKPHOS 120 11/21/2022 0252   BILITOT 0.9 11/21/2022 0252   BILITOT 0.4 08/13/2022 1116   GFRNONAA 19 (L) 11/21/2022 0252   GFRAA 84 11/18/2019 1607   Lipase  No results found for: "LIPASE"     Studies/Results: VAS [place] LOWER EXTREMITY VENOUS (DVT)  Result Date: 11/20/2022  Lower Venous DVT Study Patient Name:  YUI MULVANEY  Date of Exam:   11/20/2022 Medical Rec #: [id number]      Accession #:    [id number] Date of Birth: 05-20-1956      Patient Gender: F Patient Age:   67 years Exam Location:  [facility] Procedure:      VAS [place] LOWER EXTREMITY VENOUS (DVT) Referring Phys: Noemi Chapel --------------------------------------------------------------------------------  Indications: Swelling, and Edema. Other Indications: History of small bowel obstruction. Comparison Study: 01/22/22 Left lower ext venous positive for acute DVT in                   PTV/PERO veins. Performing Technologist: Oda Cogan RDMS, RVT  Examination Guidelines: A complete evaluation includes B-mode imaging, spectral Doppler, color Doppler, and power Doppler as needed of all accessible portions of each vessel. Bilateral testing  is considered an integral part of a complete examination. Limited examinations for reoccurring indications may be performed as noted. The reflux portion of the exam is performed with the patient in reverse Trendelenburg.  +---------+---------------+---------+-----------+----------+-------------------+ RIGHT    CompressibilityPhasicitySpontaneityPropertiesThrombus Aging      +---------+---------------+---------+-----------+----------+-------------------+ CFV      None           No       No                   Acute               +---------+---------------+---------+-----------+----------+-------------------+ SFJ      Partial                                                           +---------+---------------+---------+-----------+----------+-------------------+ FV Prox  None           No       No                   Age Indeterminate   +---------+---------------+---------+-----------+----------+-------------------+ FV Mid   None           No       No                   Age Indeterminate   +---------+---------------+---------+-----------+----------+-------------------+ FV DistalNone           No       No                   Age Indeterminate   +---------+---------------+---------+-----------+----------+-------------------+ PFV                                                   Not well visualized +---------+---------------+---------+-----------+----------+-------------------+ POP      None           No       No                   Age Indeterminate   +---------+---------------+---------+-----------+----------+-------------------+ PTV      Partial                                                          +---------+---------------+---------+-----------+----------+-------------------+ PERO     Partial                                      poorly visualized   +---------+---------------+---------+-----------+----------+-------------------+ REIV     None                                         Age Indeterminate   +---------+---------------+---------+-----------+----------+-------------------+ Right iliac veins were poorly visualized due to patient's body habitus.  +----+---------------+---------+-----------+----------+--------------+ LEFTCompressibilityPhasicitySpontaneityPropertiesThrombus Aging +----+---------------+---------+-----------+----------+--------------+ CFV Full  Yes      Yes                                 +----+---------------+---------+-----------+----------+--------------+ SFJ Full                                                         +----+---------------+---------+-----------+----------+--------------+     Summary: RIGHT: - Findings consistent with acute deep vein thrombosis involving the right common femoral vein. - Findings consistent with age indeterminate deep vein thrombosis involving the right femoral vein, right popliteal vein, right posterior tibial veins, right peroneal veins, and external iliac vein.  LEFT: - No evidence of common femoral vein obstruction.  *See table(s) above for measurements and observations. Electronically signed by Deitra Mayo MD on 11/20/2022 at 4:24:23 PM.    Final    [place] EKG SITE RITE  Result Date: 11/20/2022 If Site Rite image not attached, placement could not be confirmed due to current cardiac rhythm.  DG Chest Port 1 View  Result Date: 11/20/2022 CLINICAL DATA:  Shortness of breath EXAM: PORTABLE CHEST 1 VIEW COMPARISON:  Chest x-ray dated November 22, 2022 FINDINGS: Feeding tube tip is in the stomach. The heart size and mediastinal contours are within normal limits. Both lungs are clear. The visualized skeletal structures are unremarkable. IMPRESSION: No active disease. Feeding tube tip is in the stomach. Electronically Signed   By: Yetta Glassman M.D.   On: 11/20/2022 08:21   DG Abd Portable 1V  Result Date: 11/19/2022 CLINICAL DATA:  Encounter for feeding tube placement EXAM: PORTABLE ABDOMEN - 1 VIEW COMPARISON:  None Available. FINDINGS: Feeding tube with tip in the gastric fundus.  No bowel obstruction. IMPRESSION: Feeding tube in stomach as above Electronically Signed   By: Suzy Bouchard M.D.   On: 11/19/2022 16:22   DG Abd Portable 1V  Result Date: 11/19/2022 CLINICAL DATA:  Feeding tube placement EXAM: PORTABLE ABDOMEN - 1 VIEW COMPARISON:  Abdominal radiograph dated January 27, 2022 FINDINGS: Feeding tube tip is in the stomach. Visualized bowel gas pattern is normal. No radio-opaque calculi or other significant radiographic abnormality are seen. IMPRESSION: Feeding tube tip  is in the stomach. Electronically Signed   By: Yetta Glassman M.D.   On: 11/19/2022 15:30   US RENAL  Result Date: 11/19/2022 CLINICAL DATA:  Oliguria EXAM: RENAL / URINARY TRACT ULTRASOUND COMPLETE COMPARISON:  01/14/2022 renal ultrasound FINDINGS: Right Kidney: Renal measurements: 12.1 x 5.4 x 5.4 cm = volume: 182 mL, previously 230 mL. Echogenicity within normal limits. No mass or hydronephrosis visualized. Left Kidney: Renal measurements: 8.3 x 4.3 x 3.2 cm = volume: 60 mL, previously 100 mL. The kidney is small and increased in echogenicity, with a contour suggestive of scarring. No mass or hydronephrosis visualized. Bladder: The bladder contains a Foley and is decompressed. Other: None. IMPRESSION: 1. Left renal scarring and atrophy, which has progressed from the prior exam 2. No acute abnormality in the right kidney. Electronically Signed   By: Merilyn Baba M.D.   On: 11/19/2022 11:38    Anti-infectives: Anti-infectives (From admission, onward)    Start     Dose/Rate Route Frequency Ordered Stop   11/20/22 1215  metroNIDAZOLE (FLAGYL) IVPB 500 mg  500 mg 100 mL/hr over 60 Minutes Intravenous Every 8 hours 11/20/22 1128     11/19/22 2000  ceFEPIme (MAXIPIME) 2 g in sodium chloride 0.9 % 100 mL IVPB        2 g 200 mL/hr over 30 Minutes Intravenous Every 24 hours 11/24/2022 2215     11/19/22 1400  linezolid (ZYVOX) IVPB 600 mg        600 mg 300 mL/hr over 60 Minutes Intravenous Every 12 hours 11/19/22 1306     11/19/22 0730  clindamycin (CLEOCIN) IVPB 600 mg  Status:  Discontinued       Note to Pharmacy: Dose accordingly for necrotizing fascitis   600 mg 100 mL/hr over 30 Minutes Intravenous Every 6 hours 11/19/22 0636 11/19/22 1306   11/25/2022 2215  vancomycin variable dose per unstable renal function (pharmacist dosing)  Status:  Discontinued         Does not apply See admin instructions 10/30/2022 2215 11/19/22 1306   11/05/2022 1945  aztreonam (AZACTAM) 2 g in sodium chloride 0.9 %  100 mL IVPB  Status:  Discontinued        2 g 200 mL/hr over 30 Minutes Intravenous  Once 11/08/2022 1938 11/28/2022 1944   11/13/2022 1945  metroNIDAZOLE (FLAGYL) IVPB 500 mg        500 mg 100 mL/hr over 60 Minutes Intravenous  Once 11/05/2022 1938 11/14/2022 2115   11/27/2022 1945  vancomycin (VANCOCIN) IVPB 1000 mg/200 mL premix        1,000 mg 200 mL/hr over 60 Minutes Intravenous  Once 11/12/2022 1938 10/29/2022 2116   11/01/2022 1945  ceFEPIme (MAXIPIME) 2 g in sodium chloride 0.9 % 100 mL IVPB        2 g 200 mL/hr over 30 Minutes Intravenous  Once 10/29/2022 1944 11/19/2022 2047        Assessment/Plan  Left posterior labial abscess vs Fournier's gangrene - wound appears to track more posteriorly on examination today, will take to OR for EUA and I&D - does appear to originate from base of left labia majora but extends close to rectum  - plts 35K this AM, will transfuse 1 unit prior to OR - called patient's husband and discussed change in plan and obtained consent for procedure this afternoon   FEN: hold TF VTE: SQH ID: cefepime, linezolid, flagyl  - per CCM - E. Coli UTI AKI on CKD stage IIIa HTN HLD Hx of CVA with R sided deficits LLE DVT 12/2021 on Eliquis - anticoagulation on hold  Acute RLE DVT T2DM Severe protein calorie malnutrition Depression   LOS: 2 days   I reviewed Consultant GYN notes, last 24 h vitals and pain scores, last 48 h intake and output, last 24 h labs and trends, last 24 h imaging results, and critical care notes .   Juliet Rude, [facility] 11/21/2022, 8:40 AM Please see Amion for pager number during day hours 7:00am-4:30pm

## 2022-11-21 NOTE — Progress Notes (Signed)
[name], MRN:  [id number], DOB:  03-27-1956, LOS: 2 ADMISSION DATE:  11/06/2022 CONSULTATION DATE:  11/19/2022 REFERRING MD:  Roxanne Mins - EDP CHIEF COMPLAINT:  AMS, elevated lactic acid  History of Present Illness:  67 year old woman who presented to [facility] ED 1/21 via EMS for AMS. PMHx significant for HTN, HLD, T2DM, CVA with R-sided deficits, LLE DVT (12/2021, previously on Eliquis), depression.  Per chart review, patient was seen by her home health nurse on date of admission who was concerned that patient was dehydrated. Additionally, husband reported onset of acute confusion noted 2 days PTA (progressively worsening) as well as nausea/vomiting.   On ED arrival, normothermic, tachycardic to 110s, normotensive (intermittently soft BP with SBP no lower than 90s), tachypneic to 20s with SpO2 100%. GCS 12 with baseline R-sided deficits. Labs were notable for WBC 25.2, Hgb 11.5 (baseline), Plt 94 (baseline ~200), INR 1.5. Na 138, K 5.4, CO2 21, Cr 4.12 (baseline 1.2), BUN 48; albumin undetectably low at < 1.5. AST 63, ALT 43, AP 170, Tbili 1.9. Trop 213 > 33. UA with large Hgb, mod bili, protein, mod leuks, many bacteria. LA uptrending 5.3 > 6.3 > now  greater than 9.0. CT Head NAICA. CT Chest/A/P with extensive subcutaneous gas/debris within L perineal soft tissue, tracking along posterior L labia majora/perianal subcutaneous tissue with surrounding inflammation, no loculated fluid collection. Broad-spectrum cefepime/vanc/flagyl was initiated as well as aggressive fluid resuscitation. CCS consulted.  PCCM consulted for ICU admission in the setting of developing sepsis, persistent lactic elevation.  Pertinent Medical History:   Past Medical History:  Diagnosis Date   CVA (cerebral vascular accident) ([place])    R-sided deficits   Deep vein thrombosis (DVT) ([place])    Previously on Eliquis   HTN (hypertension)    Hyperlipidemia    T2DM (type 2 diabetes mellitus) ([place])    Significant Hospital  Events: Including procedures, antibiotic start and stop dates in addition to other pertinent events   1/21 - Presented to [facility] for AMS x 2 days. Elevated WBC, soft BP, dirty UA with frank pus on I&O cath. CT Head NAICA, CT A/P as above with concern for perianal ST infection. CCS consulted. Lactate uptrending > 9.0. PCCM consulted for ICU admission. 1/23 OBGYN consulted for concern for possible labia abscess; DVT US showing RLE dvt involving R femoral vein; BC 1/4 staph capitis; Urine culture showing E. coli  Interim History / Subjective:  Still altered but did follows intermittent commands (wiggles left toes, squeezes finger) On room air BP stable  Objective:  Blood pressure 118/61, pulse 96, temperature (!) 97.5 F (36.4 C), temperature source Axillary, resp. rate 16, height 5' (1.524 m), weight 96.7 kg, SpO2 100 %. CVP:  [0 mmHg-10 mmHg] 6 mmHg      Intake/Output Summary (Last 24 hours) at 11/21/2022 1950 Last data filed at 11/21/2022 0700 Gross per 24 hour  Intake 5201.09 ml  Output 184 ml  Net 5017.09 ml    Filed Weights   11/19/22 0544 11/20/22 0500 11/21/22 0459  Weight: 86.6 kg 90.8 kg 96.7 kg   Physical Examination: General:   ill appearing female in NAD HEENT: MM pink/moist; ETT in place Neuro: altered; slightly more alert following intermittent commands (wiggles toes on LLE and squeezes hand on LUE); R sided deficits CV: s1s2, RRR, no m/r/g PULM:  dim clear BS bilaterally; on room air GI: soft, bsx4 active  Extremities: warm/dry, RLE edema  Skin: no rashes or lesions    Resolved Hospital  Problem List:    Assessment & Plan:  Sepsis, suspect urologic source versus progressive SSTI Lactic acidosis -BC 1/4 staph capitis (contaminant); E.coli Urine culture Plan: -continue cefepime, linezolid, flagyl -follow cultures -LA trending down -cont maintenance fluid  Skin and soft tissue infection of perineum- extensive subcutaneous gas/debris within L perineal soft  tissue, tracking along posterior L labia majora/perianal subcutaneous tissue with surrounding inflammation, no loculated fluid collection. Wound expanding today. Plan: -CCS and gyn following -CCS plan to take to OR for possible I&D -cont abx as above  Altered mental status, likely in the setting of UTI. Baseline is nonverbal since stroke. Plan: -cont abx as above -mental status slowly improving -limit sedating meds  Acute renal failure versus severe AKI, ?prerenal with dehydration from GI losses, ?postrenal due to UTI Mild hyperkalemia- reoslved Plan: -cont maintenance fluid -Trend BMP / urinary output -Replace electrolytes as indicated -Avoid nephrotoxic agents, ensure adequate renal perfusion  Hypokalemia Hypophosphatemia Hypomagnesemia Plan: -repleted -trend  Acute thrombocytopenia- likely 2/2 sepsis and antibiotics Plan: -trend cbc -DVT ppx on hold  History of CVA with R-sided deficits Hypertension Hyperlipidemia Plan: -hold anti-hypertensives -cont statin -PT/OT  RLE DVT History of LLE DVT -LE [place] on R >>> confirmed DVT is present in CFV. Will need to hold [facility] currently until platelets improve, especially with concern for need for additional wound debridement/ I&D. Plan: -monitor platelets; hold dvt ppx -consider IVC filter  R gluteal pressure wound, POA Plan: -WOC following  T2DM with hyperglycemia, uncontrolled Plan: -ssi and cbg monitoring  Depression Plan: -lexapro  At risk for malnutrition Plan: -cont TF per dietician  Best Practice: (right click and "Reselect all SmartList Selections" daily)   Diet/type: tubefeeds DVT prophylaxis: SCDs while platelets low GI prophylaxis: N/A Lines: N/A Foley:  Yes, and it is still needed Code Status:  full code Last date of multidisciplinary goals of care discussion [1/22 updated husband at bedside]  Labs:  CBC: Recent Labs  Lab 2022-12-13 1925 11/19/22 0623 11/20/22 0721 11/21/22 0252  WBC 25.2*  26.3* 24.9* 22.7*  NEUTROABS 23.5*  --   --   --   HGB 11.5* 10.6* 8.6* 7.6*  HCT 35.2* 32.6* 25.2* 22.4*  MCV 110.0* 108.7* 105.0* 105.7*  PLT 94* 63* 53* 35*    Basic Metabolic Panel: Recent Labs  Lab 11/19/22 0623 11/19/22 0909 11/19/22 1603 11/19/22 2237 11/20/22 0721 11/20/22 1528 11/21/22 0252  NA 139 139 137 134* 136  --  136  K 5.2* 5.8* 4.5 4.5 4.0  --  3.3*  CL 102 104 103 102 103  --  106  CO2 17* 16* 22 21* 23  --  23  GLUCOSE 193* 155* 214* 231* 195*  --  140*  BUN 46* 47* 43* 44* 43*  --  40*  CREATININE 3.82* 3.88* 3.50* 3.40* 3.31*  --  2.70*  CALCIUM 8.5* 8.3* 8.5* 8.3* 8.5*  --  8.1*  MG 1.9 1.9  --   --  1.6* 2.0 1.8  PHOS 4.3 4.2  --   --  2.6 1.9* 1.5*      Critical care time: 35 minutes    JD Rexene Agent [facility] 11/21/2022, 8:44 AM  Please see [email or web address] for pager details.  From 7A-7P if no response, please call [phone]. After hours, please call ELink [phone].

## 2022-11-21 NOTE — Progress Notes (Signed)
SLP Cancellation Note  Patient Details Name: Samantha [name] MRN: [id number] DOB: 1956-07-08   Cancelled treatment:        Attempted to see pt for ongoing dysphagia management.  Pt with plans to go to OR this afternoon and to remain NPO at this time for procedure.  SLP will return as schedule permits when pt is cleared for PO trials.    Celedonio Savage, MA, [place] Office: [phone] 11/21/2022, 9:47 AM

## 2022-11-21 NOTE — Progress Notes (Signed)
Patient's surgery was cancelled on 11/21/22 because she received Pro-Source at 1000.  Informed 2H RN Yvone Neu that the patient should NOT be given this tomorrow morning per anesthesia. I asked him to please make sure this is communicated with the oncoming nurses tonight and tomorrow.   Tube feeds will need to be stopped at 0800 11/22/22. Attending provided notified.

## 2022-11-21 NOTE — Interval H&P Note (Signed)
History and Physical Interval Note:  11/21/2022 1:56 PM  Samantha [name]  has presented today for surgery, with the diagnosis of Perineal Infection.  The various methods of treatment have been discussed with the patient and family. After consideration of risks, benefits and other options for treatment, the patient has consented to  Procedure(s): EXAM UNDER ANESTHESIA POSSIBLE I&D (N/A) as a surgical intervention.  The patient's history has been reviewed, patient examined, no change in status, stable for surgery.  I have reviewed the patient's chart and labs.  Questions were answered to the patient's satisfaction.     [place]

## 2022-11-21 NOTE — Progress Notes (Signed)
Requested to check on patient  Has been stable  A-fib  112/72, saturating 97%, respiratory rate 15  Transfer orders to progressive bed placed  Still plan for surgery in a.m.

## 2022-11-21 NOTE — H&P (View-Only) (Signed)
Progress Note     Subjective: Pt non-verbal this AM. Evaluated perineal wounds again after GYN saw yesterday evening and felt wound was tracking more perineally.   Objective: Vital signs in last 24 hours: Temp:  [97.4 F (36.3 C)-97.6 F (36.4 C)] 97.6 F (36.4 C) (01/24 0830) Pulse Rate:  [71-108] 89 (01/24 0800) Resp:  [8-23] 21 (01/24 0800) BP: (87-134)/(50-88) 101/61 (01/24 0800) SpO2:  [92 %-100 %] 92 % (01/24 0800) Weight:  [96.7 kg] 96.7 kg (01/24 0459) Last BM Date :  (PTA)  Intake/Output from previous day: 01/23 0701 - 01/24 0700 In: 5231.1 [I.V.:1939.5; NG/GT:1201.7; IV Piggyback:2090] Out: 184 [Urine:184] Intake/Output this shift: No intake/output data recorded.  PE: General:chronically ill appearing female in bed in NAD Heart: sinus tachycardia in the low 100s Lungs:  Respiratory effort nonlabored Abd: soft, NT, ND GU: foley present, left posterior labial abscess actively draining purulent material, this does appear to track more towards the rectum, normal rectal tone and abscess and rectum to not feel like they are connecting on exam today, surrounding induration without crepitus   Lab Results:  Recent Labs    11/20/22 0721 11/21/22 0252  WBC 24.9* 22.7*  HGB 8.6* 7.6*  HCT 25.2* 22.4*  PLT 53* 35*    BMET Recent Labs    11/20/22 0721 11/21/22 0252  NA 136 136  K 4.0 3.3*  CL 103 106  CO2 23 23  GLUCOSE 195* 140*  BUN 43* 40*  CREATININE 3.31* 2.70*  CALCIUM 8.5* 8.1*    PT/INR Recent Labs    11/05/2022 1925  LABPROT 18.0*  INR 1.5*    CMP     Component Value Date/Time   NA 136 11/21/2022 0252   NA 145 (H) 08/13/2022 1116   K 3.3 (L) 11/21/2022 0252   CL 106 11/21/2022 0252   CO2 23 11/21/2022 0252   GLUCOSE 140 (H) 11/21/2022 0252   BUN 40 (H) 11/21/2022 0252   BUN 8 08/13/2022 1116   CREATININE 2.70 (H) 11/21/2022 0252   CALCIUM 8.1 (L) 11/21/2022 0252   PROT 4.4 (L) 11/21/2022 0252   PROT 6.9 08/13/2022 1116    ALBUMIN <1.5 (L) 11/21/2022 0252   ALBUMIN 3.0 (L) 08/13/2022 1116   AST 30 11/21/2022 0252   ALT 27 11/21/2022 0252   ALKPHOS 120 11/21/2022 0252   BILITOT 0.9 11/21/2022 0252   BILITOT 0.4 08/13/2022 1116   GFRNONAA 19 (L) 11/21/2022 0252   GFRAA 84 11/18/2019 1607   Lipase  No results found for: "LIPASE"     Studies/Results: VAS [place] LOWER EXTREMITY VENOUS (DVT)  Result Date: 11/20/2022  Lower Venous DVT Study Patient Name:  YUI MULVANEY  Date of Exam:   11/20/2022 Medical Rec #: [id number]      Accession #:    [id number] Date of Birth: 05-20-1956      Patient Gender: F Patient Age:   67 years Exam Location:  [facility] Procedure:      VAS [place] LOWER EXTREMITY VENOUS (DVT) Referring Phys: Noemi Chapel --------------------------------------------------------------------------------  Indications: Swelling, and Edema. Other Indications: History of small bowel obstruction. Comparison Study: 01/22/22 Left lower ext venous positive for acute DVT in                   PTV/PERO veins. Performing Technologist: Oda Cogan RDMS, RVT  Examination Guidelines: A complete evaluation includes B-mode imaging, spectral Doppler, color Doppler, and power Doppler as needed of all accessible portions of each vessel. Bilateral testing  is considered an integral part of a complete examination. Limited examinations for reoccurring indications may be performed as noted. The reflux portion of the exam is performed with the patient in reverse Trendelenburg.  +---------+---------------+---------+-----------+----------+-------------------+ RIGHT    CompressibilityPhasicitySpontaneityPropertiesThrombus Aging      +---------+---------------+---------+-----------+----------+-------------------+ CFV      None           No       No                   Acute               +---------+---------------+---------+-----------+----------+-------------------+ SFJ      Partial                                                           +---------+---------------+---------+-----------+----------+-------------------+ FV Prox  None           No       No                   Age Indeterminate   +---------+---------------+---------+-----------+----------+-------------------+ FV Mid   None           No       No                   Age Indeterminate   +---------+---------------+---------+-----------+----------+-------------------+ FV DistalNone           No       No                   Age Indeterminate   +---------+---------------+---------+-----------+----------+-------------------+ PFV                                                   Not well visualized +---------+---------------+---------+-----------+----------+-------------------+ POP      None           No       No                   Age Indeterminate   +---------+---------------+---------+-----------+----------+-------------------+ PTV      Partial                                                          +---------+---------------+---------+-----------+----------+-------------------+ PERO     Partial                                      poorly visualized   +---------+---------------+---------+-----------+----------+-------------------+ REIV     None                                         Age Indeterminate   +---------+---------------+---------+-----------+----------+-------------------+ Right iliac veins were poorly visualized due to patient's body habitus.  +----+---------------+---------+-----------+----------+--------------+ LEFTCompressibilityPhasicitySpontaneityPropertiesThrombus Aging +----+---------------+---------+-----------+----------+--------------+ CFV Full  Yes      Yes                                 +----+---------------+---------+-----------+----------+--------------+ SFJ Full                                                         +----+---------------+---------+-----------+----------+--------------+     Summary: RIGHT: - Findings consistent with acute deep vein thrombosis involving the right common femoral vein. - Findings consistent with age indeterminate deep vein thrombosis involving the right femoral vein, right popliteal vein, right posterior tibial veins, right peroneal veins, and external iliac vein.  LEFT: - No evidence of common femoral vein obstruction.  *See table(s) above for measurements and observations. Electronically signed by Deitra Mayo MD on 11/20/2022 at 4:24:23 PM.    Final    [place] EKG SITE RITE  Result Date: 11/20/2022 If Site Rite image not attached, placement could not be confirmed due to current cardiac rhythm.  DG Chest Port 1 View  Result Date: 11/20/2022 CLINICAL DATA:  Shortness of breath EXAM: PORTABLE CHEST 1 VIEW COMPARISON:  Chest x-ray dated November 22, 2022 FINDINGS: Feeding tube tip is in the stomach. The heart size and mediastinal contours are within normal limits. Both lungs are clear. The visualized skeletal structures are unremarkable. IMPRESSION: No active disease. Feeding tube tip is in the stomach. Electronically Signed   By: Yetta Glassman M.D.   On: 11/20/2022 08:21   DG Abd Portable 1V  Result Date: 11/19/2022 CLINICAL DATA:  Encounter for feeding tube placement EXAM: PORTABLE ABDOMEN - 1 VIEW COMPARISON:  None Available. FINDINGS: Feeding tube with tip in the gastric fundus.  No bowel obstruction. IMPRESSION: Feeding tube in stomach as above Electronically Signed   By: Suzy Bouchard M.D.   On: 11/19/2022 16:22   DG Abd Portable 1V  Result Date: 11/19/2022 CLINICAL DATA:  Feeding tube placement EXAM: PORTABLE ABDOMEN - 1 VIEW COMPARISON:  Abdominal radiograph dated January 27, 2022 FINDINGS: Feeding tube tip is in the stomach. Visualized bowel gas pattern is normal. No radio-opaque calculi or other significant radiographic abnormality are seen. IMPRESSION: Feeding tube tip  is in the stomach. Electronically Signed   By: Yetta Glassman M.D.   On: 11/19/2022 15:30   US RENAL  Result Date: 11/19/2022 CLINICAL DATA:  Oliguria EXAM: RENAL / URINARY TRACT ULTRASOUND COMPLETE COMPARISON:  01/14/2022 renal ultrasound FINDINGS: Right Kidney: Renal measurements: 12.1 x 5.4 x 5.4 cm = volume: 182 mL, previously 230 mL. Echogenicity within normal limits. No mass or hydronephrosis visualized. Left Kidney: Renal measurements: 8.3 x 4.3 x 3.2 cm = volume: 60 mL, previously 100 mL. The kidney is small and increased in echogenicity, with a contour suggestive of scarring. No mass or hydronephrosis visualized. Bladder: The bladder contains a Foley and is decompressed. Other: None. IMPRESSION: 1. Left renal scarring and atrophy, which has progressed from the prior exam 2. No acute abnormality in the right kidney. Electronically Signed   By: Merilyn Baba M.D.   On: 11/19/2022 11:38    Anti-infectives: Anti-infectives (From admission, onward)    Start     Dose/Rate Route Frequency Ordered Stop   11/20/22 1215  metroNIDAZOLE (FLAGYL) IVPB 500 mg  500 mg 100 mL/hr over 60 Minutes Intravenous Every 8 hours 11/20/22 1128     11/19/22 2000  ceFEPIme (MAXIPIME) 2 g in sodium chloride 0.9 % 100 mL IVPB        2 g 200 mL/hr over 30 Minutes Intravenous Every 24 hours 11/05/2022 2215     11/19/22 1400  linezolid (ZYVOX) IVPB 600 mg        600 mg 300 mL/hr over 60 Minutes Intravenous Every 12 hours 11/19/22 1306     11/19/22 0730  clindamycin (CLEOCIN) IVPB 600 mg  Status:  Discontinued       Note to Pharmacy: Dose accordingly for necrotizing fascitis   600 mg 100 mL/hr over 30 Minutes Intravenous Every 6 hours 11/19/22 0636 11/19/22 1306   11/05/2022 2215  vancomycin variable dose per unstable renal function (pharmacist dosing)  Status:  Discontinued         Does not apply See admin instructions 11/19/2022 2215 11/19/22 1306   11/17/2022 1945  aztreonam (AZACTAM) 2 g in sodium chloride 0.9 %  100 mL IVPB  Status:  Discontinued        2 g 200 mL/hr over 30 Minutes Intravenous  Once 11/08/2022 1938 11/04/2022 1944   11/10/2022 1945  metroNIDAZOLE (FLAGYL) IVPB 500 mg        500 mg 100 mL/hr over 60 Minutes Intravenous  Once 10/30/2022 1938 11/16/2022 2115   11/20/2022 1945  vancomycin (VANCOCIN) IVPB 1000 mg/200 mL premix        1,000 mg 200 mL/hr over 60 Minutes Intravenous  Once 11/25/2022 1938 11/01/2022 2116   11/12/2022 1945  ceFEPIme (MAXIPIME) 2 g in sodium chloride 0.9 % 100 mL IVPB        2 g 200 mL/hr over 30 Minutes Intravenous  Once 11/13/2022 1944 11/03/2022 2047        Assessment/Plan  Left posterior labial abscess vs Fournier's gangrene - wound appears to track more posteriorly on examination today, will take to OR for EUA and I&D - does appear to originate from base of left labia majora but extends close to rectum  - plts 35K this AM, will transfuse 1 unit prior to OR - called patient's husband and discussed change in plan and obtained consent for procedure this afternoon   FEN: hold TF VTE: SQH ID: cefepime, linezolid, flagyl  - per CCM - E. Coli UTI AKI on CKD stage IIIa HTN HLD Hx of CVA with R sided deficits LLE DVT 12/2021 on Eliquis - anticoagulation on hold  Acute RLE DVT T2DM Severe protein calorie malnutrition Depression   LOS: 2 days   I reviewed Consultant GYN notes, last 24 h vitals and pain scores, last 48 h intake and output, last 24 h labs and trends, last 24 h imaging results, and critical care notes .   Juliet Rude, [facility] Surgery 11/21/2022, 8:40 AM Please see Amion for pager number during day hours 7:00am-4:30pm

## 2022-11-22 ENCOUNTER — Encounter (HOSPITAL_COMMUNITY): Payer: Self-pay | Admitting: Pulmonary Disease

## 2022-11-22 DIAGNOSIS — A4151 Sepsis due to Escherichia coli [E. coli]: Secondary | ICD-10-CM | POA: Diagnosis not present

## 2022-11-22 DIAGNOSIS — R6520 Severe sepsis without septic shock: Secondary | ICD-10-CM | POA: Diagnosis not present

## 2022-11-22 DIAGNOSIS — I480 Paroxysmal atrial fibrillation: Secondary | ICD-10-CM | POA: Diagnosis not present

## 2022-11-22 DIAGNOSIS — D696 Thrombocytopenia, unspecified: Secondary | ICD-10-CM | POA: Diagnosis not present

## 2022-11-22 LAB — COMPREHENSIVE METABOLIC PANEL
ALT: 27 U/L (ref 0–44)
AST: 33 U/L (ref 15–41)
Albumin: <1.5 g/dL — ABNORMAL LOW (ref 3.5–5.0)
Alkaline Phosphatase: 124 U/L (ref 38–126)
Anion gap: 8 (ref 5–15)
BUN: 44 mg/dL — ABNORMAL HIGH (ref 8–23)
CO2: 23 mmol/L (ref 22–32)
Calcium: 7.8 mg/dL — ABNORMAL LOW (ref 8.9–10.3)
Chloride: 106 mmol/L (ref 98–111)
Creatinine, Ser: 2.5 mg/dL — ABNORMAL HIGH (ref 0.44–1.00)
GFR, Estimated: 21 mL/min — ABNORMAL LOW (ref >60)
Glucose, Bld: 170 mg/dL — ABNORMAL HIGH (ref 70–99)
Potassium: 3 mmol/L — ABNORMAL LOW (ref 3.5–5.1)
Sodium: 137 mmol/L (ref 135–145)
Total Bilirubin: 0.6 mg/dL (ref 0.3–1.2)
Total Protein: 4.5 g/dL — ABNORMAL LOW (ref 6.5–8.1)

## 2022-11-22 LAB — GLUCOSE, CAPILLARY
Glucose-Capillary: 113 mg/dL — ABNORMAL HIGH (ref 70–99)
Glucose-Capillary: 136 mg/dL — ABNORMAL HIGH (ref 70–99)
Glucose-Capillary: 136 mg/dL — ABNORMAL HIGH (ref 70–99)
Glucose-Capillary: 153 mg/dL — ABNORMAL HIGH (ref 70–99)
Glucose-Capillary: 174 mg/dL — ABNORMAL HIGH (ref 70–99)
Glucose-Capillary: 222 mg/dL — ABNORMAL HIGH (ref 70–99)

## 2022-11-22 LAB — PREPARE PLATELET PHERESIS: Unit division: 0

## 2022-11-22 LAB — URINE CULTURE: Culture: >=100000 — AB

## 2022-11-22 LAB — LACTIC ACID, PLASMA
Lactic Acid, Venous: 2.7 mmol/L (ref 0.5–1.9)
Lactic Acid, Venous: 3.1 mmol/L (ref 0.5–1.9)

## 2022-11-22 LAB — BPAM PLATELET PHERESIS
Blood Product Expiration Date: 202401272359
ISSUE DATE / TIME: 202401241116
Unit Type and Rh: 6200

## 2022-11-22 LAB — MAGNESIUM: Magnesium: 2 mg/dL (ref 1.7–2.4)

## 2022-11-22 LAB — CBC
HCT: 21.3 % — ABNORMAL LOW (ref 36.0–46.0)
Hemoglobin: 7.2 g/dL — ABNORMAL LOW (ref 12.0–15.0)
MCH: 35.8 pg — ABNORMAL HIGH (ref 26.0–34.0)
MCHC: 33.8 g/dL (ref 30.0–36.0)
MCV: 106 fL — ABNORMAL HIGH (ref 80.0–100.0)
Platelets: 26 10*3/uL — CL (ref 150–400)
RBC: 2.01 MIL/uL — ABNORMAL LOW (ref 3.87–5.11)
RDW: 16.6 % — ABNORMAL HIGH (ref 11.5–15.5)
WBC: 23.3 10*3/uL — ABNORMAL HIGH (ref 4.0–10.5)
nRBC: 0 % (ref 0.0–0.2)

## 2022-11-22 LAB — SURGICAL PCR SCREEN
MRSA, PCR: NEGATIVE
Staphylococcus aureus: NEGATIVE

## 2022-11-22 LAB — PHOSPHORUS: Phosphorus: 2.6 mg/dL (ref 2.5–4.6)

## 2022-11-22 LAB — PREPARE RBC (CROSSMATCH)

## 2022-11-22 MED ORDER — SODIUM CHLORIDE 0.9 % IV BOLUS
500.0000 mL | Freq: Once | INTRAVENOUS | Status: AC
  Administered 2022-11-22: 500 mL via INTRAVENOUS

## 2022-11-22 MED ORDER — ROCURONIUM BROMIDE 10 MG/ML (PF) SYRINGE
PREFILLED_SYRINGE | INTRAVENOUS | Status: AC
  Filled 2022-11-22: qty 30

## 2022-11-22 MED ORDER — PROPOFOL 10 MG/ML IV BOLUS
INTRAVENOUS | Status: AC
  Filled 2022-11-22: qty 20

## 2022-11-22 MED ORDER — SODIUM CHLORIDE 0.9% IV SOLUTION: Freq: Once | INTRAVENOUS | Status: DC

## 2022-11-22 MED ORDER — MUPIROCIN 2 % EX OINT
1.0000 | TOPICAL_OINTMENT | Freq: Two times a day (BID) | CUTANEOUS | Status: AC
  Administered 2022-11-22 – 2022-11-26 (×8): 1 via NASAL
  Filled 2022-11-22 (×2): qty 22

## 2022-11-22 MED ORDER — POTASSIUM CHLORIDE 10 MEQ/100ML IV SOLN
10.0000 meq | INTRAVENOUS | Status: AC
  Administered 2022-11-22 (×5): 10 meq via INTRAVENOUS
  Filled 2022-11-22 (×5): qty 100

## 2022-11-22 MED ORDER — LACTATED RINGERS IV BOLUS
1000.0000 mL | Freq: Once | INTRAVENOUS | Status: AC
  Administered 2022-11-22: 1000 mL via INTRAVENOUS

## 2022-11-22 MED ORDER — PHENYLEPHRINE 80 MCG/ML (10ML) SYRINGE FOR IV PUSH (FOR BLOOD PRESSURE SUPPORT)
PREFILLED_SYRINGE | INTRAVENOUS | Status: AC
  Filled 2022-11-22: qty 20

## 2022-11-22 MED ORDER — METRONIDAZOLE 500 MG/100ML IV SOLN
500.0000 mg | Freq: Two times a day (BID) | INTRAVENOUS | Status: DC
  Administered 2022-11-22 – 2022-11-28 (×12): 500 mg via INTRAVENOUS
  Filled 2022-11-22 (×12): qty 100

## 2022-11-22 MED ORDER — LIDOCAINE 2% (20 MG/ML) 5 ML SYRINGE
INTRAMUSCULAR | Status: AC
  Filled 2022-11-22: qty 15

## 2022-11-22 MED ORDER — FENTANYL CITRATE (PF) 250 MCG/5ML IJ SOLN
INTRAMUSCULAR | Status: AC
  Filled 2022-11-22: qty 5

## 2022-11-22 MED ORDER — ONDANSETRON HCL 4 MG/2ML IJ SOLN
INTRAMUSCULAR | Status: AC
  Filled 2022-11-22: qty 8

## 2022-11-22 MED ORDER — DEXAMETHASONE SODIUM PHOSPHATE 10 MG/ML IJ SOLN
INTRAMUSCULAR | Status: AC
  Filled 2022-11-22: qty 4

## 2022-11-22 NOTE — Progress Notes (Signed)
Pharmacy Antibiotic Note  Samantha [name] is a 67 y.o. female admitted on 11/12/2022 with sepsis 2/2 labial abscess. Pharmacy has been consulted for cefepime dosing, also on metronidazole and Zyvox. Cr is improving, thrombocytopenia ongoing.  Plan: -Continue cefepime 2g IV q24h -Linezolid 600mg  IV q12h -Metronidazole 500mg  IV q12h -Watch pltc - if no improvement after pltc transfusion 1/25 may need to switch linezolid back to vancomycin   Height: 5' (152.4 cm) Weight: 102 kg (224 lb 13.9 oz) IBW/kg (Calculated) : 45.5  Temp (24hrs), Avg:97.8 F (36.6 C), Min:97.4 F (36.3 C), Max:98.7 F (37.1 C)  Recent Labs  Lab 10/29/2022 1925 11/12/2022 2107 11/19/22 0045 11/19/22 0623 11/19/22 0909 11/19/22 1603 11/19/22 2237 11/20/22 0721 11/20/22 1134 11/20/22 1528 11/21/22 0252 11/22/22 0418  WBC 25.2*  --   --  26.3*  --   --   --  24.9*  --   --  22.7* 23.3*  CREATININE  --    < >  --  3.82* 3.88* 3.50* 3.40* 3.31*  --   --  2.70* 2.50*  LATICACIDVEN 5.3*   < > >9.0* 8.1* 7.5*  --   --   --  2.6* 2.4*  --   --    < > = values in this interval not displayed.    Estimated Creatinine Clearance: 24.1 mL/min (A) (by C-G formula based on SCr of 2.5 mg/dL (H)).    Allergies  Allergen Reactions   Penicillins Rash    Arrie Senate, PharmD, BCPS, [facility] Clinical Pharmacist [phone] Please check AMION for all [place] numbers 11/22/2022

## 2022-11-22 NOTE — Progress Notes (Signed)
Calling surgery team regarding her case being cancelled again.  Julian Hy, DO 11/22/22 2:34 PM [facility]  For contact information, see Amion. If no response to pager, please call PCCM consult pager. After hours, 7PM- 7AM, please call Elink.

## 2022-11-22 NOTE — Progress Notes (Signed)
NAMEArfa [name], MRN:  [id number], DOB:  1956/09/17, LOS: 2 ADMISSION DATE:  10/30/2022 CONSULTATION DATE:  11/19/2022 REFERRING MD:  Samantha [name] - EDP CHIEF COMPLAINT:  AMS, elevated lactic acid  History of Present Illness:  67 year old woman who presented to [facility] ED 1/21 via EMS for AMS. PMHx significant for HTN, HLD, T2DM, CVA with R-sided deficits, LLE DVT (12/2021, previously on Eliquis), depression.  Per chart review, patient was seen by her home health nurse on date of admission who was concerned that patient was dehydrated. Additionally, husband reported onset of acute confusion noted 2 days PTA (progressively worsening) as well as nausea/vomiting.   On ED arrival, normothermic, tachycardic to 110s, normotensive (intermittently soft BP with SBP no lower than 90s), tachypneic to 20s with SpO2 100%. GCS 12 with baseline R-sided deficits. Labs were notable for WBC 25.2, Hgb 11.5 (baseline), Plt 94 (baseline ~200), INR 1.5. Na 138, K 5.4, CO2 21, Cr 4.12 (baseline 1.2), BUN 48; albumin undetectably low at < 1.5. AST 63, ALT 43, AP 170, Tbili 1.9. Trop 213 > 33. UA with large Hgb, mod bili, protein, mod leuks, many bacteria. LA uptrending 5.3 > 6.3 > now  greater than 9.0. CT Head NAICA. CT Chest/A/P with extensive subcutaneous gas/debris within L perineal soft tissue, tracking along posterior L labia majora/perianal subcutaneous tissue with surrounding inflammation, no loculated fluid collection. Broad-spectrum cefepime/vanc/flagyl was initiated as well as aggressive fluid resuscitation. CCS consulted.  PCCM consulted for ICU admission in the setting of developing sepsis, persistent lactic elevation.  Pertinent Medical History:   Past Medical History:  Diagnosis Date   CVA (cerebral vascular accident) (HCC)    R-sided deficits   Deep vein thrombosis (DVT) (HCC)    Previously on Eliquis   HTN (hypertension)    Hyperlipidemia    T2DM (type 2 diabetes mellitus) (HCC)    Significant Hospital  Events: Including procedures, antibiotic start and stop dates in addition to other pertinent events   1/21 - Presented to [facility] for AMS x 2 days. Elevated WBC, soft BP, dirty UA with frank pus on I&O cath. CT Head NAICA, CT A/P as above with concern for perianal ST infection. CCS consulted. Lactate uptrending > 9.0. PCCM consulted for ICU admission. 1/23 OBGYN consulted for concern for possible labia abscess; DVT US showing RLE dvt involving R femoral vein; BC 1/4 staph capitis; Urine culture showing E. coli  Interim History / Subjective:  Transferred to progressive care elevated febrile ventricular response thrombocytopenic transferred back to intensive care.  Objective:  Blood pressure 118/61, pulse 96, temperature (!) 97.5 F (36.4 C), temperature source Axillary, resp. rate 16, height 5' (1.524 m), weight 96.7 kg, SpO2 100 %. CVP:  [0 mmHg-10 mmHg] 6 mmHg      Intake/Output Summary (Last 24 hours) at 11/21/2022 0175 Last data filed at 11/21/2022 0700 Gross per 24 hour  Intake 5201.09 ml  Output 184 ml  Net 5017.09 ml    Filed Weights   11/19/22 0544 11/20/22 0500 11/21/22 0459  Weight: 86.6 kg 90.8 kg 96.7 kg   Physical Examination: General: Obese elderly female HEENT: MM pink/moist culture and narrow Neuro: Right-sided weakness expressive aphasia CV: Heart sounds are regular PULM diminished at bases  GI: soft, bsx4 active  Extremities: warm/dry, 2+ edema  Skin: no rashes or lesions    Resolved Hospital Problem List:    Assessment & Plan:  Sepsis, suspect urologic source versus progressive SSTI Lactic acidosis -BC 1/4 staph capitis (contaminant); E.coli Urine culture  Plan: Continue cefepime Zyvox and Flagyl Follow cultures Trend lactic acid 1 dose of 500 cc normal saline Transfer back to intensive care due to atrial fibrillation response   Atrial fibrillation with rapid ventricular response Transfer back to intensive care unit Ventricular rate is  controlled Fluid challenge x 1    Skin and soft tissue infection of perineum- extensive subcutaneous gas/debris within L perineal soft tissue, tracking along posterior L labia majora/perianal subcutaneous tissue with surrounding inflammation, no loculated fluid collection. Wound expanding today. Plan: Scheduled for surgery which has been delayed  Altered mental status, likely in the setting of UTI. Baseline is nonverbal since stroke. Plan: -cont abx as above -mental status slowly improving -limit sedating meds  Acute renal failure versus severe AKI, ?prerenal with dehydration from GI losses, ?postrenal due to UTI Mild hyperkalemia- reoslved Lab Results  Component Value Date   CREATININE 2.50 (H) 11/22/2022   CREATININE 2.70 (H) 11/21/2022   CREATININE 3.31 (H) 11/20/2022   Recent Labs  Lab 11/20/22 0721 11/21/22 0252 11/22/22 0418  K 4.0 3.3* 3.0*    Plan: Monitor replace potassium Avoid nephrotoxins   Hypokalemia Hypophosphatemia Hypomagnesemia Plan: Monitor replete as needed   Acute thrombocytopenia- likely 2/2 sepsis and antibiotics Plan: Transfuse 1 unit of platelets  History of CVA with R-sided deficits Hypertension Hyperlipidemia Plan: Holding antihypertensives Physical therapy  RLE DVT History of LLE DVT -LE [place] on R >>> confirmed DVT is present in CFV. Will need to hold [facility] currently until platelets improve, especially with concern for need for additional wound debridement/ I&D. Plan: Noted to be thrombocytopenic receiving platelets 11/22/2022 May need IVC filter  R gluteal pressure wound, POA Plan: Wound ostomy care was following  T2DM with hyperglycemia, uncontrolled CBG (last 3)  Recent Labs    11/22/22 0012 11/22/22 0408 11/22/22 0805  GLUCAP 222* 174* 136*    Plan: Sliding-scale insulin protocol  Depression Plan: Continue Lexapro  At risk for malnutrition Plan: Currently n.p.o. for surgery  Best Practice: (right click and  "Reselect all SmartList Selections" daily)   Diet/type: tubefeeds DVT prophylaxis: SCDs while platelets low GI prophylaxis: N/A Lines: N/A Foley:  Yes, and it is still needed Code Status:  full code Last date of multidisciplinary goals of care discussion [1/22 updated husband at bedside]  Labs:  CBC: Recent Labs  Lab 11/27/2022 1925 11/19/22 0623 11/20/22 0721 11/21/22 0252  WBC 25.2* 26.3* 24.9* 22.7*  NEUTROABS 23.5*  --   --   --   HGB 11.5* 10.6* 8.6* 7.6*  HCT 35.2* 32.6* 25.2* 22.4*  MCV 110.0* 108.7* 105.0* 105.7*  PLT 94* 63* 53* 35*    Basic Metabolic Panel: Recent Labs  Lab 11/19/22 0623 11/19/22 0909 11/19/22 1603 11/19/22 2237 11/20/22 0721 11/20/22 1528 11/21/22 0252  NA 139 139 137 134* 136  --  136  K 5.2* 5.8* 4.5 4.5 4.0  --  3.3*  CL 102 104 103 102 103  --  106  CO2 17* 16* 22 21* 23  --  23  GLUCOSE 193* 155* 214* 231* 195*  --  140*  BUN 46* 47* 43* 44* 43*  --  40*  CREATININE 3.82* 3.88* 3.50* 3.40* 3.31*  --  2.70*  CALCIUM 8.5* 8.3* 8.5* 8.3* 8.5*  --  8.1*  MG 1.9 1.9  --   --  1.6* 2.0 1.8  PHOS 4.3 4.2  --   --  2.6 1.9* 1.5*      Critical care time: 35 minutes  Richardson Landry Marieli Rudy ACNP Acute Care Nurse Practitioner [place] Please consult Amion 11/22/2022, 9:59 AM

## 2022-11-22 NOTE — Progress Notes (Signed)
Progress Note  Day of Surgery  Subjective: Notified this AM by RN that patient with HR in 130-150s with PAC/PVCs. Pt transferred back to ICU but HR improved now in the 100s. Mg 2.0 and K 3.0, IV K ordered.   Objective: Vital signs in last 24 hours: Temp:  [97.4 F (36.3 C)-98.7 F (37.1 C)] 97.7 F (36.5 C) (01/25 0801) Pulse Rate:  [81-124] 99 (01/25 0801) Resp:  [12-19] 18 (01/25 0801) BP: (85-114)/(60-72) 97/63 (01/25 0801) SpO2:  [95 %-100 %] 99 % (01/25 0651) Weight:  [102 kg] 102 kg (01/25 0455) Last BM Date : 11/22/22  Intake/Output from previous day: 01/24 0701 - 01/25 0700 In: 4430.6 [I.V.:1497.5; Blood:192.5; NG/GT:1135; IV Piggyback:1535.6] Out: 335 [Urine:335] Intake/Output this shift: No intake/output data recorded.  PE: General:chronically ill appearing female in bed in NAD Heart: sinus tachycardia in the low 100s Lungs:  Respiratory effort nonlabored Abd: soft, NT, ND GU: exam deferred since likely OR today    Lab Results:  Recent Labs    11/21/22 0252 11/22/22 0418  WBC 22.7* 23.3*  HGB 7.6* 7.2*  HCT 22.4* 21.3*  PLT 35* 26*    BMET Recent Labs    11/21/22 0252 11/22/22 0418  NA 136 137  K 3.3* 3.0*  CL 106 106  CO2 23 23  GLUCOSE 140* 170*  BUN 40* 44*  CREATININE 2.70* 2.50*  CALCIUM 8.1* 7.8*    PT/INR No results for input(s): "LABPROT", "INR" in the last 72 hours.  CMP     Component Value Date/Time   NA 137 11/22/2022 0418   NA 145 (H) 08/13/2022 1116   K 3.0 (L) 11/22/2022 0418   CL 106 11/22/2022 0418   CO2 23 11/22/2022 0418   GLUCOSE 170 (H) 11/22/2022 0418   BUN 44 (H) 11/22/2022 0418   BUN 8 08/13/2022 1116   CREATININE 2.50 (H) 11/22/2022 0418   CALCIUM 7.8 (L) 11/22/2022 0418   PROT 4.5 (L) 11/22/2022 0418   PROT 6.9 08/13/2022 1116   ALBUMIN <1.5 (L) 11/22/2022 0418   ALBUMIN 3.0 (L) 08/13/2022 1116   AST 33 11/22/2022 0418   ALT 27 11/22/2022 0418   ALKPHOS 124 11/22/2022 0418   BILITOT 0.6  11/22/2022 0418   BILITOT 0.4 08/13/2022 1116   GFRNONAA 21 (L) 11/22/2022 0418   GFRAA 84 11/18/2019 1607   Lipase  No results found for: "LIPASE"     Studies/Results: VAS [place] LOWER EXTREMITY VENOUS (DVT)  Result Date: 11/20/2022  Lower Venous DVT Study Patient Name:  Samantha [name]  Date of Exam:   11/20/2022 Medical Rec #: [id number]      Accession #:    [id number] Date of Birth: 01/22/1956      Patient Gender: F Patient Age:   44 years Exam Location:  [facility] Procedure:      VAS [place] LOWER EXTREMITY VENOUS (DVT) Referring Phys: Noemi Chapel --------------------------------------------------------------------------------  Indications: Swelling, and Edema. Other Indications: History of small bowel obstruction. Comparison Study: 01/22/22 Left lower ext venous positive for acute DVT in                   PTV/PERO veins. Performing Technologist: Oda Cogan RDMS, RVT  Examination Guidelines: A complete evaluation includes B-mode imaging, spectral Doppler, color Doppler, and power Doppler as needed of all accessible portions of each vessel. Bilateral testing is considered an integral part of a complete examination. Limited examinations for reoccurring indications may be performed as noted. The reflux portion of  the exam is performed with the patient in reverse Trendelenburg.  +---------+---------------+---------+-----------+----------+-------------------+ RIGHT    CompressibilityPhasicitySpontaneityPropertiesThrombus Aging      +---------+---------------+---------+-----------+----------+-------------------+ CFV      None           No       No                   Acute               +---------+---------------+---------+-----------+----------+-------------------+ SFJ      Partial                                                          +---------+---------------+---------+-----------+----------+-------------------+ FV Prox  None           No       No                   Age  Indeterminate   +---------+---------------+---------+-----------+----------+-------------------+ FV Mid   None           No       No                   Age Indeterminate   +---------+---------------+---------+-----------+----------+-------------------+ FV DistalNone           No       No                   Age Indeterminate   +---------+---------------+---------+-----------+----------+-------------------+ PFV                                                   Not well visualized +---------+---------------+---------+-----------+----------+-------------------+ POP      None           No       No                   Age Indeterminate   +---------+---------------+---------+-----------+----------+-------------------+ PTV      Partial                                                          +---------+---------------+---------+-----------+----------+-------------------+ PERO     Partial                                      poorly visualized   +---------+---------------+---------+-----------+----------+-------------------+ REIV     None                                         Age Indeterminate   +---------+---------------+---------+-----------+----------+-------------------+ Right iliac veins were poorly visualized due to patient's body habitus.  +----+---------------+---------+-----------+----------+--------------+ LEFTCompressibilityPhasicitySpontaneityPropertiesThrombus Aging +----+---------------+---------+-----------+----------+--------------+ CFV Full           Yes      Yes                                 +----+---------------+---------+-----------+----------+--------------+  SFJ Full                                                        +----+---------------+---------+-----------+----------+--------------+     Summary: RIGHT: - Findings consistent with acute deep vein thrombosis involving the right common femoral vein. - Findings consistent with age  indeterminate deep vein thrombosis involving the right femoral vein, right popliteal vein, right posterior tibial veins, right peroneal veins, and external iliac vein.  LEFT: - No evidence of common femoral vein obstruction.  *See table(s) above for measurements and observations. Electronically signed by Waverly Ferrari MD on 11/20/2022 at 4:24:23 PM.    Final     Anti-infectives: Anti-infectives (From admission, onward)    Start     Dose/Rate Route Frequency Ordered Stop   11/22/22 2200  metroNIDAZOLE (FLAGYL) IVPB 500 mg        500 mg 100 mL/hr over 60 Minutes Intravenous Every 12 hours 11/22/22 1030     11/20/22 1215  metroNIDAZOLE (FLAGYL) IVPB 500 mg  Status:  Discontinued        500 mg 100 mL/hr over 60 Minutes Intravenous Every 8 hours 11/20/22 1128 11/22/22 1030   11/19/22 2000  ceFEPIme (MAXIPIME) 2 g in sodium chloride 0.9 % 100 mL IVPB        2 g 200 mL/hr over 30 Minutes Intravenous Every 24 hours 11/25/2022 2215     11/19/22 1400  linezolid (ZYVOX) IVPB 600 mg        600 mg 300 mL/hr over 60 Minutes Intravenous Every 12 hours 11/19/22 1306     11/19/22 0730  clindamycin (CLEOCIN) IVPB 600 mg  Status:  Discontinued       Note to Pharmacy: Dose accordingly for necrotizing fascitis   600 mg 100 mL/hr over 30 Minutes Intravenous Every 6 hours 11/19/22 0636 11/19/22 1306   11/17/2022 2215  vancomycin variable dose per unstable renal function (pharmacist dosing)  Status:  Discontinued         Does not apply See admin instructions 11/12/2022 2215 11/19/22 1306   11/04/2022 1945  aztreonam (AZACTAM) 2 g in sodium chloride 0.9 % 100 mL IVPB  Status:  Discontinued        2 g 200 mL/hr over 30 Minutes Intravenous  Once 11/06/2022 1938 11/13/2022 1944   11/03/2022 1945  metroNIDAZOLE (FLAGYL) IVPB 500 mg        500 mg 100 mL/hr over 60 Minutes Intravenous  Once 11/22/2022 1938 11/03/2022 2115   10/30/2022 1945  vancomycin (VANCOCIN) IVPB 1000 mg/200 mL premix        1,000 mg 200 mL/hr over 60 Minutes  Intravenous  Once 11/14/2022 1938 11/14/2022 2116   11/19/2022 1945  ceFEPIme (MAXIPIME) 2 g in sodium chloride 0.9 % 100 mL IVPB        2 g 200 mL/hr over 30 Minutes Intravenous  Once 11/06/2022 1944 11/16/2022 2047        Assessment/Plan  Left posterior labial abscess - wound appears to track more posteriorly on examination yesterday, planning OR for EUA and I&D - does appear to originate from base of left labia majora but extends close to rectum  - plts 26K this AM, CCM has ordered replacement  - consented with husband over the phone yesterday - will confirm with MD that pt does not  need further cardiac workup prior to OR  A Fib with RVR - EKG pending, now sinus with rate of 100-110 after IVF  FEN: hold TF VTE: SQH ID: cefepime, linezolid, flagyl  - per CCM - E. Coli UTI AKI on CKD stage IIIa HTN HLD Hx of CVA with R sided deficits LLE DVT 12/2021 on Eliquis - anticoagulation on hold  Acute RLE DVT T2DM Severe protein calorie malnutrition Depression   LOS: 3 days   I reviewed Consultant GYN notes, last 24 h vitals and pain scores, last 48 h intake and output, last 24 h labs and trends, last 24 h imaging results, and critical care notes .   Juliet Rude, [facility] 11/22/2022, 11:33 AM Please see Amion for pager number during day hours 7:00am-4:30pm

## 2022-11-22 NOTE — Progress Notes (Signed)
Received telephone consent to transfuse blood products from pt husband, Samantha [name]. Consent obtained by Francine Graven, RN and Renda Rolls, RN.

## 2022-11-22 NOTE — Progress Notes (Signed)
Lactic acid now 2.7 1 L LR bolus - Continue maintenance LR - Tube feeds on hold overnight to ensure that we do not have any more delays with her getting to the operating room.  Julian Hy, DO 11/22/22 5:18 PM [facility]  For contact information, see Amion. If no response to pager, please call PCCM consult pager. After hours, 7PM- 7AM, please call Elink.

## 2022-11-22 NOTE — Progress Notes (Signed)
Pt transferred to 6E29 without event, new primary RN Roselyn Reef in room to greet pt. All belongings brought with pt.

## 2022-11-22 NOTE — Progress Notes (Addendum)
PT Cancellation Note  Patient Details Name: Samantha [name] MRN: [id number] DOB: [birth year]   Cancelled Treatment:    Reason Eval/Treat Not Completed: (P) Medical issues which prohibited therapy Pt with decline in status. Possible return to ICU. RN asked PT to hold. PT will check back tomorrow for appropriateness.     Christene Pounds B. Migdalia Dk PT, DPT Acute Rehabilitation Services Please use secure chat or  Call Office [phone]    [place] 11/22/2022, 9:41 AM

## 2022-11-22 NOTE — Progress Notes (Signed)
SLP Cancellation Note  Patient Details Name: Samantha [name] MRN: [id number] DOB: 1956/07/20   Cancelled treatment:        Attempted to see pt for ongoing dysphagia management.  Pt NPO today for procedure (canceled yesterday) and with likely transfer back to ICU per RN.  SLP will reattempt when pt is medically ready for PO trials.    Celedonio Savage, MA, [place] Office: [phone] 11/22/2022, 10:38 AM

## 2022-11-23 ENCOUNTER — Inpatient Hospital Stay (HOSPITAL_COMMUNITY): Payer: Medicare HMO | Admitting: Anesthesiology

## 2022-11-23 ENCOUNTER — Other Ambulatory Visit: Payer: Self-pay

## 2022-11-23 ENCOUNTER — Encounter (HOSPITAL_COMMUNITY): Admission: EM | Disposition: E | Payer: Self-pay | Source: Home / Self Care | Attending: Internal Medicine

## 2022-11-23 DIAGNOSIS — N764 Abscess of vulva: Secondary | ICD-10-CM

## 2022-11-23 DIAGNOSIS — G9341 Metabolic encephalopathy: Secondary | ICD-10-CM | POA: Diagnosis not present

## 2022-11-23 DIAGNOSIS — A4151 Sepsis due to Escherichia coli [E. coli]: Secondary | ICD-10-CM | POA: Diagnosis not present

## 2022-11-23 DIAGNOSIS — R6520 Severe sepsis without septic shock: Secondary | ICD-10-CM | POA: Diagnosis not present

## 2022-11-23 LAB — GLUCOSE, CAPILLARY
Glucose-Capillary: 105 mg/dL — ABNORMAL HIGH (ref 70–99)
Glucose-Capillary: 129 mg/dL — ABNORMAL HIGH (ref 70–99)
Glucose-Capillary: 131 mg/dL — ABNORMAL HIGH (ref 70–99)
Glucose-Capillary: 133 mg/dL — ABNORMAL HIGH (ref 70–99)
Glucose-Capillary: 138 mg/dL — ABNORMAL HIGH (ref 70–99)
Glucose-Capillary: 150 mg/dL — ABNORMAL HIGH (ref 70–99)
Glucose-Capillary: 82 mg/dL (ref 70–99)
Glucose-Capillary: 93 mg/dL (ref 70–99)

## 2022-11-23 LAB — PREPARE RBC (CROSSMATCH)

## 2022-11-23 LAB — BASIC METABOLIC PANEL
Anion gap: 7 (ref 5–15)
Anion gap: 5 (ref 5–15)
BUN: 40 mg/dL — ABNORMAL HIGH (ref 8–23)
BUN: 37 mg/dL — ABNORMAL HIGH (ref 8–23)
CO2: 20 mmol/L — ABNORMAL LOW (ref 22–32)
CO2: 23 mmol/L (ref 22–32)
Calcium: 7.4 mg/dL — ABNORMAL LOW (ref 8.9–10.3)
Calcium: 7.5 mg/dL — ABNORMAL LOW (ref 8.9–10.3)
Chloride: 106 mmol/L (ref 98–111)
Chloride: 107 mmol/L (ref 98–111)
Creatinine, Ser: 2.11 mg/dL — ABNORMAL HIGH (ref 0.44–1.00)
Creatinine, Ser: 1.99 mg/dL — ABNORMAL HIGH (ref 0.44–1.00)
GFR, Estimated: 26 mL/min — ABNORMAL LOW (ref >60)
GFR, Estimated: 27 mL/min — ABNORMAL LOW (ref >60)
Glucose, Bld: 150 mg/dL — ABNORMAL HIGH (ref 70–99)
Glucose, Bld: 118 mg/dL — ABNORMAL HIGH (ref 70–99)
Potassium: 3.1 mmol/L — ABNORMAL LOW (ref 3.5–5.1)
Potassium: 3.3 mmol/L — ABNORMAL LOW (ref 3.5–5.1)
Sodium: 133 mmol/L — ABNORMAL LOW (ref 135–145)
Sodium: 135 mmol/L (ref 135–145)

## 2022-11-23 LAB — CULTURE, BLOOD (ROUTINE X 2)
Culture: NO GROWTH
Special Requests: ADEQUATE

## 2022-11-23 LAB — POCT I-STAT, CHEM 8
BUN: 35 mg/dL — ABNORMAL HIGH (ref 8–23)
Calcium, Ion: 1.12 mmol/L — ABNORMAL LOW (ref 1.15–1.40)
Chloride: 105 mmol/L (ref 98–111)
Creatinine, Ser: 2 mg/dL — ABNORMAL HIGH (ref 0.44–1.00)
Glucose, Bld: 88 mg/dL (ref 70–99)
HCT: 23 % — ABNORMAL LOW (ref 36.0–46.0)
Hemoglobin: 7.8 g/dL — ABNORMAL LOW (ref 12.0–15.0)
Potassium: 3.7 mmol/L (ref 3.5–5.1)
Sodium: 137 mmol/L (ref 135–145)
TCO2: 20 mmol/L — ABNORMAL LOW (ref 22–32)

## 2022-11-23 LAB — CBC
HCT: 19.1 % — ABNORMAL LOW (ref 36.0–46.0)
HCT: 23.8 % — ABNORMAL LOW (ref 36.0–46.0)
Hemoglobin: 6.6 g/dL — CL (ref 12.0–15.0)
Hemoglobin: 8.1 g/dL — ABNORMAL LOW (ref 12.0–15.0)
MCH: 35.9 pg — ABNORMAL HIGH (ref 26.0–34.0)
MCH: 33.5 pg (ref 26.0–34.0)
MCHC: 34.6 g/dL (ref 30.0–36.0)
MCHC: 34 g/dL (ref 30.0–36.0)
MCV: 103.8 fL — ABNORMAL HIGH (ref 80.0–100.0)
MCV: 98.3 fL (ref 80.0–100.0)
Platelets: 23 10*3/uL — CL (ref 150–400)
Platelets: 55 10*3/uL — ABNORMAL LOW (ref 150–400)
RBC: 1.84 MIL/uL — ABNORMAL LOW (ref 3.87–5.11)
RBC: 2.42 MIL/uL — ABNORMAL LOW (ref 3.87–5.11)
RDW: 16.5 % — ABNORMAL HIGH (ref 11.5–15.5)
RDW: 20.4 % — ABNORMAL HIGH (ref 11.5–15.5)
WBC: 24.5 10*3/uL — ABNORMAL HIGH (ref 4.0–10.5)
WBC: 26.9 10*3/uL — ABNORMAL HIGH (ref 4.0–10.5)
nRBC: 0.2 % (ref 0.0–0.2)
nRBC: 0.2 % (ref 0.0–0.2)

## 2022-11-23 LAB — LACTIC ACID, PLASMA
Lactic Acid, Venous: 2.8 mmol/L (ref 0.5–1.9)
Lactic Acid, Venous: 1.9 mmol/L (ref 0.5–1.9)

## 2022-11-23 LAB — PREPARE PLATELET PHERESIS: Unit division: 0

## 2022-11-23 LAB — BPAM PLATELET PHERESIS
Blood Product Expiration Date: 202401272359
ISSUE DATE / TIME: 202401251214
Unit Type and Rh: 6200

## 2022-11-23 LAB — MAGNESIUM: Magnesium: 1.9 mg/dL (ref 1.7–2.4)

## 2022-11-23 SURGERY — EXAM UNDER ANESTHESIA
Anesthesia: General

## 2022-11-23 MED ORDER — GERHARDT'S BUTT CREAM
TOPICAL_CREAM | CUTANEOUS | Status: DC | PRN
  Filled 2022-11-23: qty 1

## 2022-11-23 MED ORDER — OXYCODONE HCL 5 MG PO TABS: 5.0000 mg | ORAL_TABLET | Freq: Once | ORAL | Status: DC | PRN

## 2022-11-23 MED ORDER — DOCUSATE SODIUM 50 MG/5ML PO LIQD: 100.0000 mg | Freq: Two times a day (BID) | ORAL | Status: DC | PRN

## 2022-11-23 MED ORDER — LIDOCAINE 2% (20 MG/ML) 5 ML SYRINGE
INTRAMUSCULAR | Status: AC
  Filled 2022-11-23: qty 5

## 2022-11-23 MED ORDER — LACTATED RINGERS IV SOLN: INTRAVENOUS | Status: DC | PRN

## 2022-11-23 MED ORDER — POTASSIUM CHLORIDE 20 MEQ PO PACK
40.0000 meq | PACK | Freq: Once | ORAL | Status: AC
  Administered 2022-11-23: 40 meq
  Filled 2022-11-23: qty 2

## 2022-11-23 MED ORDER — SODIUM CHLORIDE 0.9 % IV SOLN: INTRAVENOUS | Status: DC

## 2022-11-23 MED ORDER — VANCOMYCIN HCL 2000 MG/400ML IV SOLN
2000.0000 mg | Freq: Once | INTRAVENOUS | Status: AC
  Administered 2022-11-24: 2000 mg via INTRAVENOUS
  Filled 2022-11-23: qty 400

## 2022-11-23 MED ORDER — PROPOFOL 10 MG/ML IV BOLUS
INTRAVENOUS | Status: AC
  Filled 2022-11-23: qty 20

## 2022-11-23 MED ORDER — SODIUM CHLORIDE 0.9 % IR SOLN
Status: DC | PRN
  Administered 2022-11-23: 3000 mL

## 2022-11-23 MED ORDER — DEXAMETHASONE SODIUM PHOSPHATE 10 MG/ML IJ SOLN
INTRAMUSCULAR | Status: AC
  Filled 2022-11-23: qty 1

## 2022-11-23 MED ORDER — ACETAMINOPHEN 325 MG PO TABS: 325.0000 mg | ORAL_TABLET | ORAL | Status: DC | PRN

## 2022-11-23 MED ORDER — ONDANSETRON HCL 4 MG/2ML IJ SOLN
INTRAMUSCULAR | Status: DC | PRN
  Administered 2022-11-23: 4 mg via INTRAVENOUS

## 2022-11-23 MED ORDER — POTASSIUM CHLORIDE 10 MEQ/50ML IV SOLN: 10.0000 meq | INTRAVENOUS | Status: AC

## 2022-11-23 MED ORDER — AMISULPRIDE (ANTIEMETIC) 5 MG/2ML IV SOLN: 10.0000 mg | Freq: Once | INTRAVENOUS | Status: DC | PRN

## 2022-11-23 MED ORDER — ROCURONIUM BROMIDE 10 MG/ML (PF) SYRINGE
PREFILLED_SYRINGE | INTRAVENOUS | Status: DC | PRN
  Administered 2022-11-23: 40 mg via INTRAVENOUS

## 2022-11-23 MED ORDER — MEPERIDINE HCL 25 MG/ML IJ SOLN: 6.2500 mg | INTRAMUSCULAR | Status: DC | PRN

## 2022-11-23 MED ORDER — ONDANSETRON HCL 4 MG/2ML IJ SOLN
INTRAMUSCULAR | Status: AC
  Filled 2022-11-23: qty 2

## 2022-11-23 MED ORDER — FENTANYL CITRATE (PF) 100 MCG/2ML IJ SOLN: 25.0000 ug | INTRAMUSCULAR | Status: DC | PRN

## 2022-11-23 MED ORDER — ACETAMINOPHEN 160 MG/5ML PO SOLN: 325.0000 mg | ORAL | Status: DC | PRN

## 2022-11-23 MED ORDER — POTASSIUM CHLORIDE 10 MEQ/50ML IV SOLN
10.0000 meq | INTRAVENOUS | Status: AC
  Administered 2022-11-23 (×3): 10 meq via INTRAVENOUS
  Filled 2022-11-23 (×3): qty 50

## 2022-11-23 MED ORDER — PROPOFOL 10 MG/ML IV BOLUS
INTRAVENOUS | Status: DC | PRN
  Administered 2022-11-23: 100 mg via INTRAVENOUS

## 2022-11-23 MED ORDER — LACTATED RINGERS IV BOLUS
1000.0000 mL | Freq: Once | INTRAVENOUS | Status: AC
  Administered 2022-11-23: 1000 mL via INTRAVENOUS

## 2022-11-23 MED ORDER — FENTANYL CITRATE (PF) 250 MCG/5ML IJ SOLN
INTRAMUSCULAR | Status: AC
  Filled 2022-11-23: qty 5

## 2022-11-23 MED ORDER — ACETAMINOPHEN 10 MG/ML IV SOLN: 1000.0000 mg | Freq: Once | INTRAVENOUS | Status: DC | PRN

## 2022-11-23 MED ORDER — CHLORHEXIDINE GLUCONATE 0.12 % MT SOLN
OROMUCOSAL | Status: AC
  Filled 2022-11-23: qty 15

## 2022-11-23 MED ORDER — VANCOMYCIN VARIABLE DOSE PER UNSTABLE RENAL FUNCTION (PHARMACIST DOSING): Status: DC

## 2022-11-23 MED ORDER — SODIUM CHLORIDE 0.9 % IV SOLN: INTRAVENOUS | Status: DC | PRN

## 2022-11-23 MED ORDER — OXYCODONE HCL 5 MG PO TABS
5.0000 mg | ORAL_TABLET | ORAL | Status: DC | PRN
  Administered 2022-11-23: 5 mg
  Filled 2022-11-23: qty 1

## 2022-11-23 MED ORDER — POLYETHYLENE GLYCOL 3350 17 G PO PACK: 17.0000 g | PACK | Freq: Every day | ORAL | Status: DC | PRN

## 2022-11-23 MED ORDER — BUPIVACAINE-EPINEPHRINE (PF) 0.5% -1:200000 IJ SOLN
INTRAMUSCULAR | Status: AC
  Filled 2022-11-23: qty 30

## 2022-11-23 MED ORDER — DIBUCAINE (PERIANAL) 1 % EX OINT
TOPICAL_OINTMENT | CUTANEOUS | Status: AC
  Filled 2022-11-23: qty 28

## 2022-11-23 MED ORDER — NOREPINEPHRINE 16 MG/250ML-% IV SOLN
0.0000 ug/min | INTRAVENOUS | Status: DC
  Administered 2022-11-23: 2 ug/min via INTRAVENOUS
  Filled 2022-11-23: qty 250

## 2022-11-23 MED ORDER — SUGAMMADEX SODIUM 200 MG/2ML IV SOLN
INTRAVENOUS | Status: DC | PRN
  Administered 2022-11-23: 200 mg via INTRAVENOUS

## 2022-11-23 MED ORDER — ORAL CARE MOUTH RINSE: 15.0000 mL | Freq: Once | OROMUCOSAL | Status: DC

## 2022-11-23 MED ORDER — SUCCINYLCHOLINE CHLORIDE 200 MG/10ML IV SOSY
PREFILLED_SYRINGE | INTRAVENOUS | Status: AC
  Filled 2022-11-23: qty 10

## 2022-11-23 MED ORDER — CHLORHEXIDINE GLUCONATE 0.12 % MT SOLN: 15.0000 mL | Freq: Once | OROMUCOSAL | Status: DC

## 2022-11-23 MED ORDER — SODIUM CHLORIDE 0.9% IV SOLUTION: Freq: Once | INTRAVENOUS | Status: AC

## 2022-11-23 MED ORDER — MAGNESIUM SULFATE 2 GM/50ML IV SOLN
2.0000 g | Freq: Once | INTRAVENOUS | Status: AC
  Administered 2022-11-23: 2 g via INTRAVENOUS
  Filled 2022-11-23: qty 50

## 2022-11-23 MED ORDER — ROCURONIUM BROMIDE 10 MG/ML (PF) SYRINGE
PREFILLED_SYRINGE | INTRAVENOUS | Status: AC
  Filled 2022-11-23: qty 10

## 2022-11-23 MED ORDER — VANCOMYCIN HCL 1500 MG/300ML IV SOLN: 1500.0000 mg | Freq: Once | INTRAVENOUS | Status: DC

## 2022-11-23 MED ORDER — OXYCODONE HCL 5 MG/5ML PO SOLN: 5.0000 mg | Freq: Once | ORAL | Status: DC | PRN

## 2022-11-23 MED ORDER — MIDAZOLAM HCL 2 MG/2ML IJ SOLN
INTRAMUSCULAR | Status: AC
  Filled 2022-11-23: qty 2

## 2022-11-23 MED ORDER — ONDANSETRON HCL 4 MG/2ML IJ SOLN: 4.0000 mg | Freq: Once | INTRAMUSCULAR | Status: DC | PRN

## 2022-11-23 MED ORDER — BUPIVACAINE HCL (PF) 0.25 % IJ SOLN
INTRAMUSCULAR | Status: AC
  Filled 2022-11-23: qty 30

## 2022-11-23 MED ORDER — PHENYLEPHRINE HCL-NACL 20-0.9 MG/250ML-% IV SOLN
INTRAVENOUS | Status: DC | PRN
  Administered 2022-11-23: 25 ug/min via INTRAVENOUS

## 2022-11-23 SURGICAL SUPPLY — 44 items
BAG COUNTER SPONGE SURGICOUNT (BAG) ×1 IMPLANT
BAG SPNG CNTER NS LX DISP (BAG) ×1
CANISTER SUCT 3000ML PPV (MISCELLANEOUS) ×1 IMPLANT
COVER SURGICAL LIGHT HANDLE (MISCELLANEOUS) ×1 IMPLANT
DRAIN PENROSE 0.25X18 (DRAIN) IMPLANT
DRAPE HALF SHEET 40X57 (DRAPES) ×1 IMPLANT
DRAPE UTILITY XL STRL (DRAPES) IMPLANT
ELECT CAUTERY BLADE 6.4 (BLADE) ×1 IMPLANT
ELECT REM PT RETURN 9FT ADLT (ELECTROSURGICAL) ×1
ELECTRODE REM PT RTRN 9FT ADLT (ELECTROSURGICAL) ×1 IMPLANT
GAUZE 4X4 16PLY ~~LOC~~+RFID DBL (SPONGE) ×1 IMPLANT
GAUZE PAD ABD 8X10 STRL (GAUZE/BANDAGES/DRESSINGS) ×1 IMPLANT
GAUZE SPONGE 4X4 12PLY STRL (GAUZE/BANDAGES/DRESSINGS) ×1 IMPLANT
GLOVE BIO SURGEON STRL SZ8 (GLOVE) ×1 IMPLANT
GLOVE BIOGEL PI IND STRL 8 (GLOVE) ×1 IMPLANT
GOWN STRL REUS W/ TWL LRG LVL3 (GOWN DISPOSABLE) ×2 IMPLANT
GOWN STRL REUS W/ TWL XL LVL3 (GOWN DISPOSABLE) ×1 IMPLANT
GOWN STRL REUS W/TWL LRG LVL3 (GOWN DISPOSABLE) ×2
GOWN STRL REUS W/TWL XL LVL3 (GOWN DISPOSABLE) ×1
HANDPIECE INTERPULSE COAX TIP (DISPOSABLE) ×1
HEMOSTAT ARISTA ABSORB 3G PWDR (HEMOSTASIS) IMPLANT
KIT BASIN OR (CUSTOM PROCEDURE TRAY) ×1 IMPLANT
KIT SIGMOIDOSCOPE (SET/KITS/TRAYS/PACK) IMPLANT
KIT TURNOVER KIT B (KITS) ×1 IMPLANT
NDL HYPO 25GX1X1/2 BEV (NEEDLE) ×1 IMPLANT
NEEDLE HYPO 25GX1X1/2 BEV (NEEDLE) ×1 IMPLANT
NS IRRIG 1000ML POUR BTL (IV SOLUTION) ×1 IMPLANT
PACK LITHOTOMY IV (CUSTOM PROCEDURE TRAY) ×1 IMPLANT
PAD ARMBOARD 7.5X6 YLW CONV (MISCELLANEOUS) ×1 IMPLANT
PENCIL SMOKE EVACUATOR (MISCELLANEOUS) ×1 IMPLANT
SET HNDPC FAN SPRY TIP SCT (DISPOSABLE) IMPLANT
SPECIMEN JAR SMALL (MISCELLANEOUS) IMPLANT
SPIKE FLUID TRANSFER (MISCELLANEOUS) ×1 IMPLANT
SPONGE SURGIFOAM ABS GEL 100 (HEMOSTASIS) IMPLANT
SURGILUBE 2OZ TUBE FLIPTOP (MISCELLANEOUS) ×1 IMPLANT
SUT CHROMIC 2 0 SH (SUTURE) IMPLANT
SUT MON AB 3-0 SH 27 (SUTURE)
SUT MON AB 3-0 SH27 (SUTURE) IMPLANT
SYR CONTROL 10ML LL (SYRINGE) ×1 IMPLANT
TOWEL GREEN STERILE (TOWEL DISPOSABLE) ×1 IMPLANT
TOWEL GREEN STERILE FF (TOWEL DISPOSABLE) ×1 IMPLANT
TUBE CONNECTING 12X1/4 (SUCTIONS) ×1 IMPLANT
UNDERPAD 30X36 HEAVY ABSORB (UNDERPADS AND DIAPERS) ×1 IMPLANT
YANKAUER SUCT BULB TIP NO VENT (SUCTIONS) ×1 IMPLANT

## 2022-11-23 NOTE — Progress Notes (Signed)
Pharmacy Antibiotic Note  Samantha [name] is a 67 y.o. female admitted on 11/09/2022 with sepsis and labial abscess.  Pharmacy has been consulted for cefepime and vancomycin dosing. Patient is currently being treated with Linezolid, cefepime, metronidazole. Patient underwent I&D on 1/26. SCr currently 1.99 and not at baseline. CrCl 31 ml/min. WBC 26.9 and afebrile. Antibiotics adjusted due to thrombocytopenia on Linezolid.  Plan: Continue cefepime 2g IV q24h Give Vancomycin 2000 mg IV once, further dosing based on renal function Metronidazole 500mg  IV q12h per MD Follow cultures, clinical course, WBC De-escalate as able  Height: 5' (152.4 cm) Weight: 103.8 kg (228 lb 13.4 oz) IBW/kg (Calculated) : 45.5  Temp (24hrs), Avg:97.9 F (36.6 C), Min:97 F (36.1 C), Max:98.6 F (37 C)  Recent Labs  Lab 11/20/22 0721 11/20/22 1134 11/20/22 1528 11/21/22 0252 11/22/22 0418 11/22/22 1538 11/22/22 1843 11/22/2022 0300 11/12/2022 0907 11/15/2022 1354  WBC 24.9*  --   --  22.7* 23.3*  --   --  24.5*  --  26.9*  CREATININE 3.31*  --   --  2.70* 2.50*  --   --  2.11* 2.00* 1.99*  LATICACIDVEN  --    < > 2.4*  --   --  2.7* 3.1* 2.8*  --  1.9   < > = values in this interval not displayed.    Estimated Creatinine Clearance: 30.6 mL/min (A) (by C-G formula based on SCr of 1.99 mg/dL (H)).    Allergies  Allergen Reactions   Penicillins Rash   Antimicrobials this admission: Cefepime 1/21 >>  Vancomycin 1/21; 1/26>> Metronidazole 1/23 >> Clindamycin 1/22 x1 Linezolid 1/22 >> 1/26  Microbiology results: 1/21 BCx: staph capitis 1/25 Bcx: no growth 1/21 UCx: e.coli  1/22, 1/25 MRSA PCR: negative 1/21 RVP: negative  Thank you for allowing pharmacy to be a part of this patient's care.  Jeneen Rinks 06/15/2992 7:16 PM

## 2022-11-23 NOTE — Consult Note (Signed)
[facility] Nurse Consult Note: Consult received for multiple wounds. Patient seen 4 days ago by this Probation officer. Please see note from that encounter. Orders provided at that time are active and appropriate.  [facility] nursing team will not follow, but will remain available to this patient, the nursing and medical teams.  Please re-consult if needed.  Thank you for inviting [place] to participate in this patient's Plan of Care.  Maudie Flakes, MSN, RN, CNS, [place], Serita Grammes, [employer], [employer] phone:  [phone]

## 2022-11-23 NOTE — Progress Notes (Signed)
Notified Lysbeth Galas, RN/Elink of critical lab results. K 3.1, Hgb 6.6, PLT-23.

## 2022-11-23 NOTE — Progress Notes (Addendum)
eLink Physician-Brief Progress Note Patient Name: Samantha [name] DOB: [birth year] MRN: [id number]   Date of Service  11/20/2022  HPI/Events of Note  Hemoglobin 6.6 gm / dl, platelets 23 K, no active bleeding, patient going to the OR this morning for I & D. K+ 3.1, Mg++ 1.9, Cr 2.11.  eICU Interventions  Transfuse 2 units of PRBC and two units of platelets, replete electrolytes per E-Link electrolyte replacement protocol.        Kerry Kass [place] 11/08/2022, 4:27 AM

## 2022-11-23 NOTE — Progress Notes (Signed)
Per CRNA, patient still needs one unit of platelets to be transfused. Platelets brought to PACU with patient. Platelets were already checked off by CRNA and OR RN before arrival to PACU. Platelets also checked by this RN and Seabron Spates RN. Platelets started in PACU by this RN.

## 2022-11-23 NOTE — Progress Notes (Signed)
[name], MRN:  [id number], DOB:  1956/04/14, LOS: 4 ADMISSION DATE:  04-Dec-2022 CONSULTATION DATE:  11/19/2022 REFERRING MD:  Samantha [name] - EDP CHIEF COMPLAINT:  AMS, elevated lactic acid  History of Present Illness:  67 year old woman who presented to [facility] ED Dec 04, 2022 via EMS for AMS. PMHx significant for HTN, HLD, T2DM, CVA with R-sided deficits, LLE DVT (12/2021, previously on Eliquis), depression.  Per chart review, patient was seen by her home health nurse on date of admission who was concerned that patient was dehydrated. Additionally, husband reported onset of acute confusion noted 2 days PTA (progressively worsening) as well as nausea/vomiting.   On ED arrival, normothermic, tachycardic to 110s, normotensive (intermittently soft BP with SBP no lower than 90s), tachypneic to 20s with SpO2 100%. GCS 12 with baseline R-sided deficits. Labs were notable for WBC 25.2, Hgb 11.5 (baseline), Plt 94 (baseline ~200), INR 1.5. Na 138, K 5.4, CO2 21, Cr 4.12 (baseline 1.2), BUN 48; albumin undetectably low at < 1.5. AST 63, ALT 43, AP 170, Tbili 1.9. Trop 213 > 33. UA with large Hgb, mod bili, protein, mod leuks, many bacteria. LA uptrending 5.3 > 6.3 > now  greater than 9.0. CT Head NAICA. CT Chest/A/P with extensive subcutaneous gas/debris within L perineal soft tissue, tracking along posterior L labia majora/perianal subcutaneous tissue with surrounding inflammation, no loculated fluid collection. Broad-spectrum cefepime/vanc/flagyl was initiated as well as aggressive fluid resuscitation. CCS consulted.  PCCM consulted for ICU admission in the setting of developing sepsis, persistent lactic elevation.  Pertinent Medical History:   Past Medical History:  Diagnosis Date   CVA (cerebral vascular accident) ([place])    R-sided deficits   Deep vein thrombosis (DVT) ([place])    Previously on Eliquis   HTN (hypertension)    Hyperlipidemia    T2DM (type 2 diabetes mellitus) ([place])    Significant Hospital  Events: Including procedures, antibiotic start and stop dates in addition to other pertinent events   04-Dec-2022 - Presented to [facility] for AMS x 2 days. Elevated WBC, soft BP, dirty UA with frank pus on I&O cath. CT Head NAICA, CT A/P as above with concern for perianal ST infection. CCS consulted. Lactate uptrending > 9.0. PCCM consulted for ICU admission. 1/23 OBGYN consulted for concern for possible labia abscess; DVT US showing RLE dvt involving R femoral vein; BC 1/4 staph capitis; Urine culture showing E. coli  Interim History / Subjective:   Tachycardia present but improved, associated relative hypotension Anemic and thrombocytopenic this morning, PRBC and platelets ordered Hypokalemic, potassium ordered   Objective:  Blood pressure (!) 86/51, pulse 90, temperature 98.6 F (37 C), temperature source Axillary, resp. rate 18, height 5' (1.524 m), weight 103.8 kg, SpO2 93 %.        Intake/Output Summary (Last 24 hours) at 11/27/2022 0757 Last data filed at 11/02/2022 0748 Gross per 24 hour  Intake 5324.29 ml  Output 665 ml  Net 4659.29 ml   Filed Weights   11/22/22 0455 11/22/22 1248 11/05/2022 0500  Weight: 102 kg 102 kg 103.8 kg   Physical Examination: General: Obese, acute chronically ill-appearing woman laying in bed HEENT: Oropharynx is clear, no stridor Neuro: Opens eyes to voice, turns head and appears to track, does not answer questions, does not follow commands.  Does not communicate but some moaning CV: Regular, borderline tachycardic, no murmur PULM overall clear, decreased inferiorly GI: Obese, soft, nondistended, positive bowel sounds Extremities: 1+ lower extremity edema.  Foot drop boots in place  Skin: No rash    Resolved Hospital Problem List:    Assessment & Plan:  Sepsis, suspect urologic source plus progressive SSTI Lactic acidosis, clearing -Continue cefepime, linezolid, Flagyl -Continue to follow culture information, E. coli is sensitive to cefepime,  resistant to others -Definitive therapy for her soft tissue infection pending.  Appreciate surgery assistance and input -Volume/station as needed   Atrial fibrillation with rapid ventricular response, improved -Following telemetry -Not on any rate controlling medications currently  Skin and soft tissue infection of perineum- extensive subcutaneous gas/debris within L perineal soft tissue, tracking along posterior L labia majora/perianal subcutaneous tissue with surrounding inflammation, no loculated fluid collection. Wound expanding today. Plan: -Planning for surgery when stable to do so.  Current issues, anemia, thrombocytopenia, hypokalemia all of which are being repleted  Altered mental status, likely in the setting of UTI. Baseline is nonverbal since stroke. Plan: -Supportive care for sepsis -Follow mental status for improvement -Limit any sedating medications, pain control  Acute renal failure versus severe AKI, ?prerenal with dehydration from GI losses, ?postrenal due to UTI.  Improving Mild hyperkalemia- reoslved, now hypokalemic Plan: -Replace potassium this morning and follow -Avoid nephrotoxins -Continue to follow urine output, BMP  Hypokalemia Hypophosphatemia Hypomagnesemia Plan: -Replete potassium this morning -Follow BMP  Acute thrombocytopenia- likely 2/2 sepsis and antibiotics Plan: -Receiving platelets this morning 1/26  History of CVA with R-sided deficits Hypertension Hyperlipidemia Plan: -Antihypertensives on hold in setting infection, relative hypotension -Physical therapy, supportive care for history of CVA  RLE DVT History of LLE DVT -LE [place] on R >>> confirmed DVT is present in CFV. Will need to hold [facility] currently until platelets improve, especially with concern for need for additional wound debridement/ I&D. Plan: -Depending on platelet trend, surgical needs may require an IVC filter -Anticoagulation on hold currently  R gluteal pressure wound,  POA Plan: -Appreciate WOC  T2DM with hyperglycemia, uncontrolled Plan: -Continue SSI as per protocol  Depression Plan: -Continue Lexapro  At risk for malnutrition Plan: -Currently n.p.o. for surgery  Best Practice: (right click and "Reselect all SmartList Selections" daily)   Diet/type: tubefeeds on hold for surgery DVT prophylaxis: SCDs while platelets low GI prophylaxis: N/A Lines: N/A Foley:  Yes, and it is still needed Code Status:  full code Last date of multidisciplinary goals of care discussion [1/26 updated husband at bedside]  Labs:  CBC: Recent Labs  Lab 11/02/2022 1925 11/19/22 0623 11/20/22 0721 11/21/22 0252 11/22/22 0418 11/08/2022 0300  WBC 25.2* 26.3* 24.9* 22.7* 23.3* 24.5*  NEUTROABS 23.5*  --   --   --   --   --   HGB 11.5* 10.6* 8.6* 7.6* 7.2* 6.6*  HCT 35.2* 32.6* 25.2* 22.4* 21.3* 19.1*  MCV 110.0* 108.7* 105.0* 105.7* 106.0* 103.8*  PLT 94* 63* 53* 35* 26* 23*   Basic Metabolic Panel: Recent Labs  Lab 11/19/22 0909 11/19/22 1603 11/19/22 2237 11/20/22 0721 11/20/22 1528 11/21/22 0252 11/22/22 0418 11/13/2022 0300  NA 139   < > 134* 136  --  136 137 133*  K 5.8*   < > 4.5 4.0  --  3.3* 3.0* 3.1*  CL 104   < > 102 103  --  106 106 106  CO2 16*   < > 21* 23  --  23 23 20*  GLUCOSE 155*   < > 231* 195*  --  140* 170* 150*  BUN 47*   < > 44* 43*  --  40* 44* 40*  CREATININE 3.88*   < >  3.40* 3.31*  --  2.70* 2.50* 2.11*  CALCIUM 8.3*   < > 8.3* 8.5*  --  8.1* 7.8* 7.4*  MG 1.9  --   --  1.6* 2.0 1.8 2.0 1.9  PHOS 4.2  --   --  2.6 1.9* 1.5* 2.6  --    < > = values in this interval not displayed.     Critical care time: 33 minutes   Baltazar Apo, MD, PhD 11/02/2022, 8:06 AM [facility] [phone] or if no answer before 7:00PM call [phone] For any issues after 7:00PM please call eLink [phone]

## 2022-11-23 NOTE — Anesthesia Postprocedure Evaluation (Signed)
Anesthesia Post Note  Patient: Samantha [name]  Procedure(s) Performed: EXAM UNDER ANESTHESIA INCISION AND DRAINAGE OF PERINEAL ABSCESS     Patient location during evaluation: PACU Anesthesia Type: General Level of consciousness: awake and alert Pain management: pain level controlled Vital Signs Assessment: post-procedure vital signs reviewed and stable Respiratory status: spontaneous breathing, nonlabored ventilation, respiratory function stable and patient connected to nasal cannula oxygen Cardiovascular status: blood pressure returned to baseline and stable Postop Assessment: no apparent nausea or vomiting Anesthetic complications: no  No notable events documented.  Last Vitals:  Vitals:   13-Dec-2022 1108 12/13/2022 1123  BP: 103/71 103/71  Pulse: 99 (!) 104  Resp: 15 15  Temp:    SpO2: 100% 93%    Last Pain:  Vitals:   12-13-22 1103  TempSrc: Temporal  PainSc:                  Samantha [name]

## 2022-11-23 NOTE — Interval H&P Note (Signed)
History and Physical Interval Note:  11/04/2022 8:54 AM  Samantha [name]  has presented today for surgery, with the diagnosis of Perineal Infection.  The various methods of treatment have been discussed with the patient and family. After consideration of risks, benefits and other options for treatment, the patient has consented to  Procedure(s): EXAM UNDER ANESTHESIA POSSIBLE I&D (N/A) as a surgical intervention.  The patient's history has been reviewed, patient examined, no change in status, stable for surgery.  I have reviewed the patient's chart and labs.  Questions were answered to the patient's satisfaction.     [place]

## 2022-11-23 NOTE — Progress Notes (Signed)
Pharmacy Electrolyte Replacement  Recent Labs:  Recent Labs    11/22/22 0418 11/26/2022 0300  K 3.0* 3.1*  MG 2.0 1.9  PHOS 2.6  --   CREATININE 2.50* 2.11*    Low Critical Values (K </= 2.5, Phos </= 1, Mg </= 1) Present: None  MD Contacted: n/a  Plan:  Received potassium chloride 10 mEq IV x3 this morning (1/26) Give additional potassium chloride 10 mEq IV x2 today Follow repeat electrolytes

## 2022-11-23 NOTE — Progress Notes (Signed)
SLP Cancellation Note  Patient Details Name: Davyn Elsasser MRN: [id number] DOB: 02-Jun-1956   Cancelled treatment:        Attempted to see pt for ongoing dysphagia management.  Pt is off floor for surgery.  Will reattempt as SLP schedule permits when pt is appropriate for PO trials.   Celedonio Savage, MA, [place] Office: [phone] Dec 10, 2022, 9:08 AM

## 2022-11-23 NOTE — Transfer of Care (Signed)
Immediate Anesthesia Transfer of Care Note  Patient: Samantha [name]  Procedure(s) Performed: EXAM UNDER ANESTHESIA INCISION AND DRAINAGE OF PERINEAL ABSCESS  Patient Location: PACU  Anesthesia Type:General  Level of Consciousness: responds to stimulation  Airway & Oxygen Therapy: Patient Spontanous Breathing and Patient connected to face mask oxygen  Post-op Assessment: Report given to RN  Post vital signs: Reviewed and stable  Last Vitals:  Vitals Value Taken Time  BP 103/71 11/04/2022 1116  Temp    Pulse 98 11/27/2022 1122  Resp 16 11/18/2022 1122  SpO2 90 % 11/06/2022 1122  Vitals shown include unvalidated device data.  Last Pain:  Vitals:   11/28/2022 1103  TempSrc: Temporal  PainSc:          Complications: No notable events documented.

## 2022-11-23 NOTE — Anesthesia Procedure Notes (Signed)
Procedure Name: Intubation Date/Time: 11/19/2022 9:32 AM  Performed by: Lowella Dell, CRNAPre-anesthesia Checklist: Patient identified, Emergency Drugs available, Suction available and Patient being monitored Patient Re-evaluated:Patient Re-evaluated prior to induction Oxygen Delivery Method: Circle System Utilized Preoxygenation: Pre-oxygenation with 100% oxygen Induction Type: IV induction Ventilation: Mask ventilation without difficulty Laryngoscope Size: Mac and 4 Grade View: Grade I Tube type: Oral Number of attempts: 1 Airway Equipment and Method: Stylet and Oral airway Placement Confirmation: ETT inserted through vocal cords under direct vision, positive ETCO2 and breath sounds checked- equal and bilateral Secured at: 22 cm Tube secured with: Tape Dental Injury: Teeth and Oropharynx as per pre-operative assessment

## 2022-11-23 NOTE — Op Note (Signed)
Preoperative diagnosis: Left labial abscess  Postoperative diagnosis: Same  Procedure: Exam under anesthesia /incision and drainage of left labial  abscess complex  Surgeon: Erroll Luna, MD  Anesthesia: LMA  Drains: Quarter inch Penrose drain  Specimen: Cultures of wound taken  EBL: Minimal  Indications for procedure: The patient is a 67 year old with multiple medical comorbidities who was admitted for a draining perineal abscess.  This was evaluated and felt to be a left labial abscess.  Multiple attempts were undertaken to get her to the operating room in the last couple days due to n.p.o. status issues she had to be delayed.  She presents today for exam under anesthesia and washout of her left labial wound.The procedure has been discussed with the patient.  Alternative therapies have been discussed with the patient.  Operative risks include bleeding,  Infection,  Organ injury,  Nerve injury,  Blood vessel injury,  DVT,  Pulmonary embolism,  Death,  And possible reoperation.  Medical management risks include worsening of present situation.  The success of the procedure is 50 -90 % at treating patients symptoms.  The patient understands and agrees to proceed.   Description of procedure: The patient was brought from the ICU to the holding area.  Patient taken back to the operating room.  She initially was placed supine upon the operating table.  After induction of general esthesia, she was placed in lithotomy and appropriately padded.  The perineum was then prepped and draped in a sterile fashion.  Timeout was performed.  Proper patient, site and procedure verified.  On external examination along the lateral edge of her introitus vagina was a 4 cm opening.  There is some fibrinous exudate.  The remainder of the examination showed skin excoriation from the breakdown from moisture.  There are no other areas of fluctuance involving the perineum noted on digital examination.  Anoscopy was then done.   She had stool in the rectal vault.  There is no signs of abscess or fistula to the anal crypts.  Digital examination was also performed which showed no communication between the space and the anal crypts or rectum.  I then placed my finger into the wound which tracked along the lateral wall of the vagina.  Inferior centimeter show this was necrotic and I cut this back with cautery.  She is per family thrombocytopenic therefore I limited the amount of sharp dissection.  I then used a Kelly to debride the open wound which tracked for about 3 to 4 cm along the lateral wall of the vagina.  There is no undrained fluid collections.  There is no signs of necrotizing infection this point in time.  There is no crepitance of air in the wound tissue planes were intact.  I focused on the opening and cleaning the edges as well as also backing out this wound with 3 L of saline.  I placed a counterincision on the left buttock about 3 cm away.  I placed a Penrose drain into this to help facilitate better deep space drainage.  This tracked inferiorly for about 3 to 4 cm right finger.  This did not communicate with the rectum or anal canal whatsoever.  The edges were cleaned up with cautery.  This was cut back to bleeding tissue.  There is no undue bleeding so but I used Arista to help with that due to her platelet deficiency.  A single stitch of 2-0 nylon was used to hold the drain in place.  A saline soaked  4 x 4 was placed into the opening.  A large pad was placed.  Patient was then taken out of lithotomy.  Patient was then extubated taken back to the ICU in guarded condition.

## 2022-11-23 NOTE — Progress Notes (Signed)
OT Cancellation Note  Patient Details Name: Samantha [name] MRN: [id number] DOB: 11/30/1955   Cancelled Treatment:    Reason Eval/Treat Not Completed: Patient at procedure or test/ unavailable  Metta Clines 11/17/2022, 9:28 AM 10/30/2022  RP, OTR/L  [facility]  Office:  [phone]

## 2022-11-23 NOTE — Progress Notes (Signed)
PT Cancellation Note  Patient Details Name: Torry Adamczak MRN: [id number] DOB: Dec 31, 1955   Cancelled Treatment:    Reason Eval/Treat Not Completed: Patient at procedure or test/unavailable (in surgery)   Jauna Raczynski B Shan Padgett 11/05/2022, 9:10 AM Bayard Males, [place] Office: [phone]

## 2022-11-23 NOTE — H&P (Signed)
Patient for the OR today.  Multiple issues with stopping her tube feeds over the last 48 hours have slowed progression to OR.  Plan is to washout wound today.  Addressed any other issues certainly contributing to her overall condition.  High risk of bleeding due to thrombocytopenia which has been persistent for the last number of days.  May need to place Penrose drain to facilitate better drainage of area.  Further assess wound once in operating room.The procedure has been discussed with the patient.  Alternative therapies have been discussed with the patient.  Operative risks include bleeding,  Infection,  Organ injury,  Nerve injury,  Blood vessel injury,  DVT,  Pulmonary embolism,  Death,  And possible reoperation.  Medical management risks include worsening of present situation.  The success of the procedure is 50 -90 % at treating patients symptoms.  The patient understands and agrees to proceed.

## 2022-11-24 ENCOUNTER — Inpatient Hospital Stay (HOSPITAL_COMMUNITY): Payer: Medicare HMO

## 2022-11-24 DIAGNOSIS — A419 Sepsis, unspecified organism: Secondary | ICD-10-CM | POA: Diagnosis not present

## 2022-11-24 DIAGNOSIS — R6521 Severe sepsis with septic shock: Secondary | ICD-10-CM

## 2022-11-24 LAB — PREPARE PLATELET PHERESIS
Unit division: 0
Unit division: 0

## 2022-11-24 LAB — BPAM PLATELET PHERESIS
Blood Product Expiration Date: 202401272359
Blood Product Expiration Date: 202401282359
ISSUE DATE / TIME: 202401260855
ISSUE DATE / TIME: 202401260855
Unit Type and Rh: 5100
Unit Type and Rh: 5100

## 2022-11-24 LAB — GLUCOSE, CAPILLARY
Glucose-Capillary: 140 mg/dL — ABNORMAL HIGH (ref 70–99)
Glucose-Capillary: 168 mg/dL — ABNORMAL HIGH (ref 70–99)
Glucose-Capillary: 139 mg/dL — ABNORMAL HIGH (ref 70–99)
Glucose-Capillary: 177 mg/dL — ABNORMAL HIGH (ref 70–99)
Glucose-Capillary: 162 mg/dL — ABNORMAL HIGH (ref 70–99)
Glucose-Capillary: 183 mg/dL — ABNORMAL HIGH (ref 70–99)

## 2022-11-24 LAB — CBC
HCT: 24.5 % — ABNORMAL LOW (ref 36.0–46.0)
Hemoglobin: 8.3 g/dL — ABNORMAL LOW (ref 12.0–15.0)
MCH: 32.8 pg (ref 26.0–34.0)
MCHC: 33.9 g/dL (ref 30.0–36.0)
MCV: 96.8 fL (ref 80.0–100.0)
Platelets: 60 10*3/uL — ABNORMAL LOW (ref 150–400)
RBC: 2.53 MIL/uL — ABNORMAL LOW (ref 3.87–5.11)
RDW: 20.4 % — ABNORMAL HIGH (ref 11.5–15.5)
WBC: 29.1 10*3/uL — ABNORMAL HIGH (ref 4.0–10.5)
nRBC: 0.4 % — ABNORMAL HIGH (ref 0.0–0.2)

## 2022-11-24 LAB — BASIC METABOLIC PANEL
Anion gap: 10 (ref 5–15)
BUN: 37 mg/dL — ABNORMAL HIGH (ref 8–23)
CO2: 18 mmol/L — ABNORMAL LOW (ref 22–32)
Calcium: 7.5 mg/dL — ABNORMAL LOW (ref 8.9–10.3)
Chloride: 106 mmol/L (ref 98–111)
Creatinine, Ser: 1.91 mg/dL — ABNORMAL HIGH (ref 0.44–1.00)
GFR, Estimated: 29 mL/min — ABNORMAL LOW (ref >60)
Glucose, Bld: 127 mg/dL — ABNORMAL HIGH (ref 70–99)
Potassium: 3.9 mmol/L (ref 3.5–5.1)
Sodium: 134 mmol/L — ABNORMAL LOW (ref 135–145)

## 2022-11-24 LAB — LACTIC ACID, PLASMA
Lactic Acid, Venous: 4 mmol/L (ref 0.5–1.9)
Lactic Acid, Venous: 2.3 mmol/L (ref 0.5–1.9)

## 2022-11-24 LAB — PHOSPHORUS: Phosphorus: 2 mg/dL — ABNORMAL LOW (ref 2.5–4.6)

## 2022-11-24 LAB — MAGNESIUM: Magnesium: 2 mg/dL (ref 1.7–2.4)

## 2022-11-24 MED ORDER — CHLORHEXIDINE GLUCONATE CLOTH 2 % EX PADS
6.0000 | MEDICATED_PAD | CUTANEOUS | Status: DC
  Administered 2022-11-24 – 2022-11-30 (×7): 6 via TOPICAL

## 2022-11-24 MED ORDER — OXYCODONE HCL 5 MG PO TABS
5.0000 mg | ORAL_TABLET | ORAL | Status: DC | PRN
  Administered 2022-11-24 – 2022-12-01 (×9): 5 mg
  Filled 2022-11-24 (×10): qty 1

## 2022-11-24 MED ORDER — SODIUM PHOSPHATES 45 MMOLE/15ML IV SOLN
15.0000 mmol | Freq: Once | INTRAVENOUS | Status: AC
  Administered 2022-11-24: 15 mmol via INTRAVENOUS
  Filled 2022-11-24: qty 5

## 2022-11-24 MED ORDER — ALBUMIN HUMAN 5 % IV SOLN
25.0000 g | Freq: Once | INTRAVENOUS | Status: AC
  Administered 2022-11-24: 25 g via INTRAVENOUS
  Filled 2022-11-24: qty 500

## 2022-11-24 MED ORDER — SODIUM CHLORIDE 0.9 % IV SOLN
100.0000 mg | Freq: Every day | INTRAVENOUS | Status: DC
  Administered 2022-11-24: 100 mg via INTRAVENOUS
  Filled 2022-11-24 (×2): qty 5

## 2022-11-24 NOTE — Progress Notes (Signed)
Pharmacy Electrolyte Replacement  Recent Labs:  Recent Labs    11/27/2022 0300 11/28/2022 0907 11/24/22 0226  K 3.1*   < > 3.9  MG 1.9  --   --   PHOS  --   --  2.0*  CREATININE 2.11*   < > 1.91*   < > = values in this interval not displayed.    Low Critical Values (K </= 2.5, Phos </= 1, Mg </= 1) Present: None  MD Contacted: n/a  Plan: NaPhos 2mmol IV x1. Recheck in AM.

## 2022-11-24 NOTE — Progress Notes (Signed)
NAMEDavionna [name], MRN:  [id number], DOB:  September 21, 1956, LOS: 5 ADMISSION DATE:  12/06/2022 CONSULTATION DATE:  11/19/2022 REFERRING MD:  Samantha [name] - EDP CHIEF COMPLAINT:  AMS, elevated lactic acid  History of Present Illness:  67 year old woman who presented to [facility] ED 12/05/2022 via EMS for AMS. PMHx significant for HTN, HLD, T2DM, CVA with R-sided deficits, LLE DVT (12/2021, previously on Eliquis), depression.  Per chart review, patient was seen by her home health nurse on date of admission who was concerned that patient was dehydrated. Additionally, husband reported onset of acute confusion noted 2 days PTA (progressively worsening) as well as nausea/vomiting.   On ED arrival, normothermic, tachycardic to 110s, normotensive (intermittently soft BP with SBP no lower than 90s), tachypneic to 20s with SpO2 100%. GCS 12 with baseline R-sided deficits. Labs were notable for WBC 25.2, Hgb 11.5 (baseline), Plt 94 (baseline ~200), INR 1.5. Na 138, K 5.4, CO2 21, Cr 4.12 (baseline 1.2), BUN 48; albumin undetectably low at < 1.5. AST 63, ALT 43, AP 170, Tbili 1.9. Trop 213 > 33. UA with large Hgb, mod bili, protein, mod leuks, many bacteria. LA uptrending 5.3 > 6.3 > now  greater than 9.0. CT Head NAICA. CT Chest/A/P with extensive subcutaneous gas/debris within L perineal soft tissue, tracking along posterior L labia majora/perianal subcutaneous tissue with surrounding inflammation, no loculated fluid collection. Broad-spectrum cefepime/vanc/flagyl was initiated as well as aggressive fluid resuscitation. CCS consulted.  PCCM consulted for ICU admission in the setting of developing sepsis, persistent lactic elevation.  Pertinent Medical History:   Past Medical History:  Diagnosis Date   CVA (cerebral vascular accident) (HCC)    R-sided deficits   Deep vein thrombosis (DVT) (HCC)    Previously on Eliquis   HTN (hypertension)    Hyperlipidemia    T2DM (type 2 diabetes mellitus) (HCC)    Significant Hospital  Events: Including procedures, antibiotic start and stop dates in addition to other pertinent events   12/06/2022 - Presented to [facility] for AMS x 2 days. Elevated WBC, soft BP, dirty UA with frank pus on I&O cath. CT Head NAICA, CT A/P as above with concern for perianal ST infection. CCS consulted. Lactate uptrending > 9.0. PCCM consulted for ICU admission. 1/23 OBGYN consulted for concern for possible labia abscess; DVT US showing RLE dvt involving R femoral vein; BC 1/4 staph capitis; Urine culture showing E. Coli 1/26 surgical drainage and Penrose drain placement 126 wound culture >> budding yeast, rare GPC, rare GNR, rare GPR >>  1/26 linezolid changed to vancomycin  Interim History / Subjective:  Surgical drainage perineal wound 1/26 Norepinephrine added, currently 3 Linezolid was changed to vancomycin, micafungin was added overnight 1/27 I/O +24 L total, remains on LR 75 cc/h   Objective:  Blood pressure 125/62, pulse (!) 107, temperature 99.1 F (37.3 C), temperature source Oral, resp. rate (!) 26, height 5' (1.524 m), weight 110.3 kg, SpO2 95 %.    FiO2 (%):  [0 %] 0 %   Intake/Output Summary (Last 24 hours) at 11/24/2022 0724 Last data filed at 11/24/2022 0600 Gross per 24 hour  Intake 4313.92 ml  Output 205 ml  Net 4108.92 ml   Filed Weights   11/22/22 1248 11/17/2022 0500 11/24/22 0225  Weight: 102 kg 103.8 kg 110.3 kg   Physical Examination: General: Obese woman laying in bed, no distress HEENT: Oropharynx clear, no stridor Neuro: More alert, eyes open, tracks.  Moaned with stimulation, did not speak or answer questions CV:  Regular borderline tachycardic, no murmur PULM clear bilaterally GI: Soft, nondistended with positive bowel sounds.  Question mild lower quadrant tenderness Extremities: No lower extremity edema, foot drop boots in place Skin: No rash    Resolved Hospital Problem List:    Assessment & Plan:  Septic shock, with urologic source plus progressive SSTI.   UTI E. coli sensitive to cefepime resistant others Lactic acidosis, clearing -Linezolid changed to vancomycin on 1/26 given thrombocytopenia -Continue cefepime, metronidazole -Micafungin added 1/27 based on wound culture data -Continue to follow wound culture to completion and adjust antibiotics, antifungals as appropriate -Wean norepinephrine as able -Follow lactic acid for clearance -Appreciate surgery management, definitive therapy -24 L positive, stop LR infusion and follow fluid status  Atrial fibrillation with rapid ventricular response, improved -Following telemetry off any rate control medications currently  Skin and soft tissue infection of perineum- extensive subcutaneous gas/debris within L perineal soft tissue, tracking along posterior L labia majora/perianal subcutaneous tissue with surrounding inflammation Plan: -Appreciate surgery management.  Drain management as per their plans -Antibiotics as above  Altered mental status, likely in the setting of UTI.  Son reports that she has been nonverbal since her CVA but we need to confirm this with family, completing reports of baseline mental status Plan: -Supportive care and treatment for sepsis -Follow mental status for improvement -Limit sedating medications if at all possible -If mental status fails to clear then we will need to strongly consider repeat MRI brain to rule out recurrent stroke  Acute renal failure versus severe AKI, ?prerenal with dehydration from GI losses, ?postrenal due to UTI.  Improving but oliguric Mild hyperkalemia- reoslved, now hypokalemic Plan: -Following BMP, urine output closely -Stop LR maintenance fluid today, appears to be euvolemic/hypervolemic  Hypokalemia, improved Hypophosphatemia Hypomagnesemia Plan: -Supplement Phos 1/27 -Follow BMP  Acute thrombocytopenia- likely 2/2 sepsis and antibiotics Plan: -Following CBC  History of CVA with R-sided  deficits Hypertension Hyperlipidemia Plan: -Continue to hold antihypertensives in setting of sepsis -Physical therapy, supportive care for her history of CVA  RLE DVT History of LLE DVT -LE [place] on R >>> confirmed DVT is present in CFV.  Plan: -Anticoagulation currently on hold, will discuss with surgery timing of restart.  Should be okay given that this was only I&D. -Would like to see platelets continue to rebound before starting systemic anticoagulation -May require IVC filter depending on when we are able to restart  R gluteal pressure wound, POA Plan: -Appreciate WOC, continue  T2DM with hyperglycemia, uncontrolled Plan: -Continue SSI as per protocol  Depression Plan: -Continue Lexapro  At risk for malnutrition Plan: -Tube feeds restarted postop  Best Practice: (right click and "Reselect all SmartList Selections" daily)   Diet/type: tubefeeds  DVT prophylaxis: SCDs while platelets low GI prophylaxis: N/A Lines: N/A Foley:  Yes, and it is still needed Code Status:  full code Last date of multidisciplinary goals of care discussion [1/27 updated daughter at bedside]  Labs:  CBC: Recent Labs  Lab 11-19-2022 1925 11/19/22 0623 11/21/22 0252 11/22/22 0418 11/03/2022 0300 11/10/2022 0907 10/30/2022 1354 11/24/22 0226  WBC 25.2*   < > 22.7* 23.3* 24.5*  --  26.9* 29.1*  NEUTROABS 23.5*  --   --   --   --   --   --   --   HGB 11.5*   < > 7.6* 7.2* 6.6* 7.8* 8.1* 8.3*  HCT 35.2*   < > 22.4* 21.3* 19.1* 23.0* 23.8* 24.5*  MCV 110.0*   < > 105.7* 106.0*  103.8*  --  98.3 96.8  PLT 94*   < > 35* 26* 23*  --  55* 60*   < > = values in this interval not displayed.   Basic Metabolic Panel: Recent Labs  Lab 11/20/22 0721 11/20/22 1528 11/21/22 0252 11/22/22 0418 December 14, 2022 0300 December 14, 2022 0907 14-Dec-2022 1354 11/24/22 0226  NA 136  --  136 137 133* 137 135 134*  K 4.0  --  3.3* 3.0* 3.1* 3.7 3.3* 3.9  CL 103  --  106 106 106 105 107 106  CO2 23  --  23 23 20*  --  23 18*   GLUCOSE 195*  --  140* 170* 150* 88 118* 127*  BUN 43*  --  40* 44* 40* 35* 37* 37*  CREATININE 3.31*  --  2.70* 2.50* 2.11* 2.00* 1.99* 1.91*  CALCIUM 8.5*  --  8.1* 7.8* 7.4*  --  7.5* 7.5*  MG 1.6* 2.0 1.8 2.0 1.9  --   --   --   PHOS 2.6 1.9* 1.5* 2.6  --   --   --  2.0*     Critical care time:  33  minutes   Baltazar Apo, MD, PhD 11/24/2022, 7:24 AM [facility] [phone] or if no answer before 7:00PM call [phone] For any issues after 7:00PM please call eLink [phone]

## 2022-11-24 NOTE — Progress Notes (Signed)
Progress Note  1 Day Post-Op  Subjective: Daughter at bedside this AM. Dressings changed with RN and NT.   Objective: Vital signs in last 24 hours: Temp:  [97.4 F (36.3 C)-100.6 F (38.1 C)] 98.3 F (36.8 C) (01/27 1100) Pulse Rate:  [77-115] 93 (01/27 1130) Resp:  [11-27] 21 (01/27 1130) BP: (78-149)/(46-76) 126/63 (01/27 1130) SpO2:  [89 %-99 %] 97 % (01/27 1130) FiO2 (%):  [0 %] 0 % (01/26 1158) Weight:  [110.3 kg] 110.3 kg (01/27 0225) Last BM Date : 11/24/22  Intake/Output from previous day: 01/26 0701 - 01/27 0700 In: 4837.4 [I.V.:1532.4; Blood:863.5; NG/GT:944.2; IV Piggyback:1497.3] Out: 205 [Urine:185; Blood:20] Intake/Output this shift: Total I/O In: 577.3 [I.V.:47.7; NG/GT:220; IV Piggyback:309.6] Out: 44 [Urine:60]  PE: General:chronically ill appearing female in bed in NAD Heart: sinus tachycardia in the low 100s Lungs:  Respiratory effort nonlabored Abd: soft, NT, ND GU: vaginal wound with packing removed, some seropurulent drainage, foley and rectal tube present, skin breakdown of thighs and buttocks with some skin tears present    Lab Results:  Recent Labs    11/24/2022 1354 11/24/22 0226  WBC 26.9* 29.1*  HGB 8.1* 8.3*  HCT 23.8* 24.5*  PLT 55* 60*    BMET Recent Labs    11/08/2022 1354 11/24/22 0226  NA 135 134*  K 3.3* 3.9  CL 107 106  CO2 23 18*  GLUCOSE 118* 127*  BUN 37* 37*  CREATININE 1.99* 1.91*  CALCIUM 7.5* 7.5*    PT/INR No results for input(s): "LABPROT", "INR" in the last 72 hours.  CMP     Component Value Date/Time   NA 134 (L) 11/24/2022 0226   NA 145 (H) 08/13/2022 1116   K 3.9 11/24/2022 0226   CL 106 11/24/2022 0226   CO2 18 (L) 11/24/2022 0226   GLUCOSE 127 (H) 11/24/2022 0226   BUN 37 (H) 11/24/2022 0226   BUN 8 08/13/2022 1116   CREATININE 1.91 (H) 11/24/2022 0226   CALCIUM 7.5 (L) 11/24/2022 0226   PROT 4.5 (L) 11/22/2022 0418   PROT 6.9 08/13/2022 1116   ALBUMIN <1.5 (L) 11/22/2022 0418    ALBUMIN 3.0 (L) 08/13/2022 1116   AST 33 11/22/2022 0418   ALT 27 11/22/2022 0418   ALKPHOS 124 11/22/2022 0418   BILITOT 0.6 11/22/2022 0418   BILITOT 0.4 08/13/2022 1116   GFRNONAA 29 (L) 11/24/2022 0226   GFRAA 84 11/18/2019 1607   Lipase  No results found for: "LIPASE"     Studies/Results: No results found.  Anti-infectives: Anti-infectives (From admission, onward)    Start     Dose/Rate Route Frequency Ordered Stop   11/24/22 0800  micafungin (MYCAMINE) 100 mg in sodium chloride 0.9 % 100 mL IVPB        100 mg 105 mL/hr over 1 Hours Intravenous Daily 11/24/22 0554 12/01/22 0959   11/24/22 0100  vancomycin (VANCOREADY) IVPB 1500 mg/300 mL  Status:  Discontinued        1,500 mg 150 mL/hr over 120 Minutes Intravenous  Once 11/01/2022 1532 10/29/2022 1600   11/24/22 0100  vancomycin (VANCOREADY) IVPB 2000 mg/400 mL        2,000 mg 200 mL/hr over 120 Minutes Intravenous  Once 11/06/2022 1600 11/24/22 0305   11/03/2022 1530  vancomycin variable dose per unstable renal function (pharmacist dosing)         Does not apply See admin instructions 10/31/2022 1532     11/22/22 2200  metroNIDAZOLE (FLAGYL) IVPB 500 mg  500 mg 100 mL/hr over 60 Minutes Intravenous Every 12 hours 11/22/22 1030     11/20/22 1215  metroNIDAZOLE (FLAGYL) IVPB 500 mg  Status:  Discontinued        500 mg 100 mL/hr over 60 Minutes Intravenous Every 8 hours 11/20/22 1128 11/22/22 1030   11/19/22 2000  ceFEPIme (MAXIPIME) 2 g in sodium chloride 0.9 % 100 mL IVPB        2 g 200 mL/hr over 30 Minutes Intravenous Every 24 hours 10/31/2022 2215     11/19/22 1400  linezolid (ZYVOX) IVPB 600 mg  Status:  Discontinued        600 mg 300 mL/hr over 60 Minutes Intravenous Every 12 hours 11/19/22 1306 December 02, 2022 1515   11/19/22 0730  clindamycin (CLEOCIN) IVPB 600 mg  Status:  Discontinued       Note to Pharmacy: Dose accordingly for necrotizing fascitis   600 mg 100 mL/hr over 30 Minutes Intravenous Every 6 hours 11/19/22  0636 11/19/22 1306   10/29/2022 2215  vancomycin variable dose per unstable renal function (pharmacist dosing)  Status:  Discontinued         Does not apply See admin instructions 11/14/2022 2215 11/19/22 1306   11/20/2022 1945  aztreonam (AZACTAM) 2 g in sodium chloride 0.9 % 100 mL IVPB  Status:  Discontinued        2 g 200 mL/hr over 30 Minutes Intravenous  Once 11/14/2022 1938 11/17/2022 1944   10/31/2022 1945  metroNIDAZOLE (FLAGYL) IVPB 500 mg        500 mg 100 mL/hr over 60 Minutes Intravenous  Once 11/27/2022 1938 11/04/2022 2115   11/13/2022 1945  vancomycin (VANCOCIN) IVPB 1000 mg/200 mL premix        1,000 mg 200 mL/hr over 60 Minutes Intravenous  Once 11/04/2022 1938 11/15/2022 2116   11/24/2022 1945  ceFEPIme (MAXIPIME) 2 g in sodium chloride 0.9 % 100 mL IVPB        2 g 200 mL/hr over 30 Minutes Intravenous  Once 11/15/2022 1944 11/04/2022 2047        Assessment/Plan  Left posterior labial abscess POD1 s/p I&D - labial abscess that tracks up along vagina, Cxs reincubated but budding yeast seen and antifungal coverage added by primary team - penrose drain in place - do not need to pack the wound  - no indication for further debridement at this time - surgical team will see again early next week, please call if any concerns arise in the meantime   FEN: TF VTE: SQH ID: cefepime, vanc, flagyl, micafungin   - per CCM - E. Coli UTI AKI on CKD stage IIIa HTN HLD Hx of CVA with R sided deficits LLE DVT 12/2021 on Eliquis - anticoagulation on hold  Acute RLE DVT T2DM Severe protein calorie malnutrition Depression   LOS: 5 days   I reviewed Consultant GYN notes, last 24 h vitals and pain scores, last 48 h intake and output, last 24 h labs and trends, last 24 h imaging results, and critical care notes .   Norm Parcel, [facility] 11/24/2022, 11:48 AM Please see Amion for pager number during day hours 7:00am-4:30pm

## 2022-11-24 NOTE — Progress Notes (Signed)
Speech Language Pathology Treatment: Dysphagia  Patient Details Name: Samantha [name] MRN: [id number] DOB: [birth year] Today's Date: 11/24/2022 Time: 1214-1228 SLP Time Calculation (min) (ACUTE ONLY): 14 min  Assessment / Plan / Recommendation Clinical Impression  SLP followed up for PO trials. Per chart review, pt with no hx of dysphagia or aphasia following CVA in 12/2021. She was treated for cognitive impairements, dysarthria, and right sided weakness during prior admission with CVA.  Per RN, pt exhibiting some expressive aphasic like symptoms during interactions this admission. This was noted with SLP during interaction this date as well. Pt alert and nodding head to simple yes no questions, only able to verbally elicit "hello". This date, right facial droop noted as well as right sided anterior spillage during thin liquids via cup without pt awareness. Pt also exhibited some oral holding, lingual discoordination across boluses and suspected delay in swallow initiation per palpation. Audible swallows noted. Pt did not elicit any coughing during trials but appeared to exhibit both oral and suspected pharyngeal components of dysphagia. Per nursing, MD to follow up with MRI of head this date to check for any acute or subacute findings. Recommend continue NPO (ice chips okay following oral care as tolerated). Will follow up for continued PO trials. Future instrumental swallow assessment likely warranted, (favor FEES given patients larger body habitus).        HPI HPI: 67 year old woman who presented to [facility] ED 2022-11-21 via EMS for AMS.  Pt found to be septic due to soft tissue infection around perineum. Head CT 2022/11/21 and CXR 1/23 with no acute findings. PMHx significant for HTN, HLD, T2DM, CVA with R-sided deficits, LLE DVT (12/2021, previously on Eliquis), depression. No hx dysphagia from prior CVA.      SLP Plan  Continue with current plan of care      Recommendations for follow up therapy are one  component of a multi-disciplinary discharge planning process, led by the attending physician.  Recommendations may be updated based on patient status, additional functional criteria and insurance authorization.    Recommendations  Diet recommendations: NPO Medication Administration: Via alternative means                Oral Care Recommendations: Oral care QID;Oral care prior to ice chip/H20 Follow Up Recommendations: Other (comment) (TBD) Assistance recommended at discharge: Frequent or constant Supervision/Assistance SLP Visit Diagnosis: Dysphagia, unspecified (R13.10);Dysphagia, oral phase (R13.11) Plan: Continue with current plan of care           Hayden Rasmussen MA, CCC-SLP Acute Rehabilitation Services    11/24/2022, 12:43 PM

## 2022-11-24 NOTE — Progress Notes (Addendum)
eLink Physician-Brief Progress Note Patient Name: Samantha [name] DOB: Jul 09, 1956 MRN: [id number]   Date of Service  11/24/2022  HPI/Events of Note  Lactic acid up to 4.0, antibiotic coverage with Vancomycin + Cefepime + Flagyl should be adequate coverage although adding anti-fungal empiric coverage might be a consideration, given extensive abscess and cellulitis the possibility of volume under-resuscitation due to ongoing third spacing needs to be considered.  eICU Interventions  Albumin 5 % 500 ml iv bolus x 1 ordered and lactic acid trended, if elevation persists or worsens despite the bolus, broadening coverage may be indicated. ADDENDUM Review of culture of surgical specimen shows that it is growing Fungi. Will add Micafungin empirically.        Frederik Pear 11/24/2022, 5:44 AM

## 2022-11-25 DIAGNOSIS — A419 Sepsis, unspecified organism: Secondary | ICD-10-CM | POA: Diagnosis not present

## 2022-11-25 DIAGNOSIS — R6520 Severe sepsis without septic shock: Secondary | ICD-10-CM | POA: Diagnosis not present

## 2022-11-25 DIAGNOSIS — G9341 Metabolic encephalopathy: Secondary | ICD-10-CM | POA: Diagnosis not present

## 2022-11-25 LAB — GLUCOSE, CAPILLARY
Glucose-Capillary: 152 mg/dL — ABNORMAL HIGH (ref 70–99)
Glucose-Capillary: 169 mg/dL — ABNORMAL HIGH (ref 70–99)
Glucose-Capillary: 187 mg/dL — ABNORMAL HIGH (ref 70–99)
Glucose-Capillary: 176 mg/dL — ABNORMAL HIGH (ref 70–99)
Glucose-Capillary: 180 mg/dL — ABNORMAL HIGH (ref 70–99)
Glucose-Capillary: 178 mg/dL — ABNORMAL HIGH (ref 70–99)

## 2022-11-25 LAB — BASIC METABOLIC PANEL
Anion gap: 10 (ref 5–15)
Anion gap: 9 (ref 5–15)
BUN: 40 mg/dL — ABNORMAL HIGH (ref 8–23)
BUN: 41 mg/dL — ABNORMAL HIGH (ref 8–23)
CO2: 22 mmol/L (ref 22–32)
CO2: 20 mmol/L — ABNORMAL LOW (ref 22–32)
Calcium: 7.7 mg/dL — ABNORMAL LOW (ref 8.9–10.3)
Calcium: 7.6 mg/dL — ABNORMAL LOW (ref 8.9–10.3)
Chloride: 107 mmol/L (ref 98–111)
Chloride: 107 mmol/L (ref 98–111)
Creatinine, Ser: 1.96 mg/dL — ABNORMAL HIGH (ref 0.44–1.00)
Creatinine, Ser: 1.95 mg/dL — ABNORMAL HIGH (ref 0.44–1.00)
GFR, Estimated: 28 mL/min — ABNORMAL LOW (ref >60)
GFR, Estimated: 28 mL/min — ABNORMAL LOW (ref >60)
Glucose, Bld: 162 mg/dL — ABNORMAL HIGH (ref 70–99)
Glucose, Bld: 193 mg/dL — ABNORMAL HIGH (ref 70–99)
Potassium: 3.4 mmol/L — ABNORMAL LOW (ref 3.5–5.1)
Potassium: 3.8 mmol/L (ref 3.5–5.1)
Sodium: 139 mmol/L (ref 135–145)
Sodium: 136 mmol/L (ref 135–145)

## 2022-11-25 LAB — CBC
HCT: 22.8 % — ABNORMAL LOW (ref 36.0–46.0)
Hemoglobin: 7.8 g/dL — ABNORMAL LOW (ref 12.0–15.0)
MCH: 33.3 pg (ref 26.0–34.0)
MCHC: 34.2 g/dL (ref 30.0–36.0)
MCV: 97.4 fL (ref 80.0–100.0)
Platelets: 44 10*3/uL — ABNORMAL LOW (ref 150–400)
RBC: 2.34 MIL/uL — ABNORMAL LOW (ref 3.87–5.11)
RDW: 19.4 % — ABNORMAL HIGH (ref 11.5–15.5)
WBC: 17.1 10*3/uL — ABNORMAL HIGH (ref 4.0–10.5)
nRBC: 0.3 % — ABNORMAL HIGH (ref 0.0–0.2)

## 2022-11-25 LAB — PHOSPHORUS: Phosphorus: 2.2 mg/dL — ABNORMAL LOW (ref 2.5–4.6)

## 2022-11-25 LAB — MAGNESIUM
Magnesium: 2 mg/dL (ref 1.7–2.4)
Magnesium: 2 mg/dL (ref 1.7–2.4)

## 2022-11-25 LAB — LACTIC ACID, PLASMA: Lactic Acid, Venous: 2.1 mmol/L (ref 0.5–1.9)

## 2022-11-25 LAB — ALBUMIN: Albumin: <1.5 g/dL — ABNORMAL LOW (ref 3.5–5.0)

## 2022-11-25 MED ORDER — FUROSEMIDE 10 MG/ML IJ SOLN
40.0000 mg | Freq: Once | INTRAMUSCULAR | Status: AC
  Administered 2022-11-25: 40 mg via INTRAVENOUS
  Filled 2022-11-25: qty 4

## 2022-11-25 MED ORDER — POTASSIUM CHLORIDE 20 MEQ PO PACK: 40.0000 meq | PACK | Freq: Once | ORAL | Status: DC

## 2022-11-25 MED ORDER — POTASSIUM CHLORIDE 20 MEQ PO PACK
20.0000 meq | PACK | Freq: Once | ORAL | Status: AC
  Administered 2022-11-25: 20 meq
  Filled 2022-11-25: qty 1

## 2022-11-25 MED ORDER — FLUCONAZOLE IN SODIUM CHLORIDE 200-0.9 MG/100ML-% IV SOLN
200.0000 mg | INTRAVENOUS | Status: DC
  Administered 2022-11-25 – 2022-11-27 (×3): 200 mg via INTRAVENOUS
  Filled 2022-11-25 (×3): qty 100

## 2022-11-25 MED ORDER — SODIUM CHLORIDE 0.9 % IV SOLN
15.0000 mmol | Freq: Once | INTRAVENOUS | Status: AC
  Administered 2022-11-25: 15 mmol via INTRAVENOUS
  Filled 2022-11-25 (×2): qty 5

## 2022-11-25 MED ORDER — METOCLOPRAMIDE HCL 10 MG PO TABS
5.0000 mg | ORAL_TABLET | Freq: Three times a day (TID) | ORAL | Status: DC
  Administered 2022-11-25 – 2022-11-28 (×8): 5 mg
  Filled 2022-11-25 (×11): qty 1

## 2022-11-25 NOTE — Progress Notes (Signed)
NAMEEvalee [name], MRN:  [id number], DOB:  Nov 29, 1955, LOS: 6 ADMISSION DATE:  11/04/2022 CONSULTATION DATE:  11/19/2022 REFERRING MD:  Samantha [name] - EDP CHIEF COMPLAINT:  AMS, elevated lactic acid  History of Present Illness:  67 year old woman who presented to [name] 1/21 via EMS for AMS. PMHx significant for HTN, HLD, T2DM, CVA with R-sided deficits, LLE DVT (12/2021, previously on Eliquis), depression.  Per chart review, patient was seen by her home health nurse on date of admission who was concerned that patient was dehydrated. Additionally, husband reported onset of acute confusion noted 2 days PTA (progressively worsening) as well as nausea/vomiting.   On ED arrival, normothermic, tachycardic to 110s, normotensive (intermittently soft BP with SBP no lower than 90s), tachypneic to 20s with SpO2 100%. GCS 12 with baseline R-sided deficits. Labs were notable for WBC 25.2, Hgb 11.5 (baseline), Plt 94 (baseline ~200), INR 1.5. Na 138, K 5.4, CO2 21, Cr 4.12 (baseline 1.2), BUN 48; albumin undetectably low at < 1.5. AST 63, ALT 43, AP 170, Tbili 1.9. Trop 213 > 33. UA with large Hgb, mod bili, protein, mod leuks, many bacteria. LA uptrending 5.3 > 6.3 > now  greater than 9.0. CT Head NAICA. CT Chest/A/P with extensive subcutaneous gas/debris within L perineal soft tissue, tracking along posterior L labia majora/perianal subcutaneous tissue with surrounding inflammation, no loculated fluid collection. Broad-spectrum cefepime/vanc/flagyl was initiated as well as aggressive fluid resuscitation. Samantha [name] consulted.  Samantha [name] consulted for ICU admission in the setting of developing sepsis, persistent lactic elevation.  Pertinent Medical History:   Past Medical History:  Diagnosis Date   CVA (cerebral vascular accident) ([place])    R-sided deficits   Deep vein thrombosis (DVT) ([place])    Previously on Eliquis   HTN (hypertension)    Hyperlipidemia    T2DM (type 2 diabetes mellitus) ([place])    Significant Hospital  Events: Including procedures, antibiotic start and stop dates in addition to other pertinent events   1/21 - Presented to [name] for AMS x 2 days. Elevated WBC, soft BP, dirty UA with frank pus on I&O cath. CT Head NAICA, CT A/P as above with concern for perianal ST infection. Samantha [name] consulted. Lactate uptrending > 9.0. Samantha [name] consulted for ICU admission. 1/23 Samantha [name] consulted for concern for possible labia abscess; DVT US showing RLE dvt involving R femoral vein; BC 1/4 staph capitis; Urine culture showing E. Coli 1/26 surgical drainage and Penrose drain placement 126 wound culture >> budding yeast, rare GPC, rare GNR, rare GPR >>  1/26 linezolid changed to vancomycin 127 MRI brain > no acute intracranial abnormality.  Remote infarcts in the left corona radiata and inferior right cerebellum with expected evolution and remote blood products.  White matter changes extending along the corticospinal tracts to the medulla, new from prior exam consistent with wallerian degeneration  Interim History / Subjective:  Serum creatinine stable 1.96 Lactate 2.1 WBC significantly improved Platelets 44 <60 I/O+ 25.7 L total   Objective:  Blood pressure (!) 121/56, pulse 95, temperature (!) 97 F (36.1 C), temperature source Axillary, resp. rate 20, height 5' (1.524 m), weight 111.4 kg, SpO2 99 %.        Intake/Output Summary (Last 24 hours) at 11/25/2022 0819 Last data filed at 11/25/2022 0800 Gross per 24 hour  Intake 2343.23 ml  Output 1100 ml  Net 1243.23 ml   Filed Weights   11/10/2022 0500 11/24/22 0225 11/25/22 0112  Weight: 103.8 kg 110.3 kg 111.4 kg   Physical Examination:  General: Obese woman laying in bed no distress HEENT: No stridor, oropharynx clear Neuro: Awake, eyes open, tracks.  Occasionally able to speak single words.  Did not respond to me today.  Intermittently follows commands CV: Regular, no murmur PULM clear bilaterally, decreased to both bases GI: Nondistended, positive bowel  sounds Extremities: Foot drop boots in place, no lower extremity edema Skin: No rash    Resolved Hospital Problem List:    Assessment & Plan:  Septic shock, with urologic source plus progressive SSTI.  UTI E. coli sensitive to cefepime resistant others Lactic acidosis, clearing -Continue cefepime, metronidazole 1/28.  Plan to change micafungin to fluconazole which should adequately cover yeast in wound if present.  Will discontinue vancomycin and follow.  Will try to continue to de-escalate based on how well she tolerates and culture data.  Predominant species in wound culture is GNR -Continue wound care.  Appreciate surgery management -Lactic acid has cleared -Would like to initiate gentle diuresis as she stabilizes hemodynamically  Atrial fibrillation with rapid ventricular response, improved -Following telemetry off any rate control medications at this time  Skin and soft tissue infection of perineum- extensive subcutaneous gas/debris within L perineal soft tissue, tracking along posterior L labia majora/perianal subcutaneous tissue with surrounding inflammation Plan: -Drain management as per surgery.  Appreciate management -Antibiotics as above  Altered mental status, likely in the setting of UTI.  Son reported that she has been nonverbal since her CVA but daughters confirmed that actually the patient was able to converse prior to this admission Plan: -Supportive care and treatment for sepsis -No new stroke noted on MRI brain 1/27 -Limit sedating medications as able  Acute renal failure versus severe AKI, ?prerenal with dehydration from GI losses, ?postrenal due to UTI.  Improving but oliguric Mild hyperkalemia- reoslved, now hypokalemic Plan: -Following BMP and urine output closely -Total body volume overloaded, would like to try to initiate diuresis when stable to do so, consider 1/28 or 1/29 -Replace potassium  Hypokalemia,  improved Hypophosphatemia Hypomagnesemia Plan: -Supplement potassium and Phos again on 1/28 -Following BMP  Acute thrombocytopenia- likely 2/2 sepsis and antibiotics Plan: -Continue to follow CBC  History of CVA with R-sided deficits Hypertension Hyperlipidemia Plan: -Antihypertensives currently on hold -Physical therapy, supportive care for her history of CVA  RLE DVT History of LLE DVT -LE [place] on R >>> confirmed DVT is present in CFV.  Plan: -Continue to hold anticoagulation for now, will discuss with surgery timing of restart.  Should be okay given that this was only I&D. -Would like to see platelets continue to rebound before starting systemic anticoagulation -She may require IVC filter depending on when we are able to restart anticoagulation  R gluteal pressure wound, POA Plan: -Appreciate WOC, continue  T2DM with hyperglycemia, uncontrolled Plan: -Continue sliding scale insulin as per protocol  Depression Plan: -Continue Lexapro  At risk for malnutrition Plan: -Tube feeding restarted postop  Best Practice: (right click and "Reselect all SmartList Selections" daily)   Diet/type: tubefeeds  DVT prophylaxis: SCDs while platelets low GI prophylaxis: N/A Lines: N/A Foley:  Yes, and it is still needed Code Status:  full code Last date of multidisciplinary goals of care discussion [1/28 updated daughter at bedside]  Labs:  CBC: Recent Labs  Lab 11/06/2022 1925 11/19/22 0623 11/22/22 0418 11/10/2022 0300 11/22/2022 0907 11/04/2022 1354 11/24/22 0226 11/25/22 0317  WBC 25.2*   < > 23.3* 24.5*  --  26.9* 29.1* 17.1*  NEUTROABS 23.5*  --   --   --   --   --   --   --  HGB 11.5*   < > 7.2* 6.6* 7.8* 8.1* 8.3* 7.8*  HCT 35.2*   < > 21.3* 19.1* 23.0* 23.8* 24.5* 22.8*  MCV 110.0*   < > 106.0* 103.8*  --  98.3 96.8 97.4  PLT 94*   < > 26* 23*  --  55* 60* 44*   < > = values in this interval not displayed.   Basic Metabolic Panel: Recent Labs  Lab  11/20/22 1528 11/21/22 0252 11/22/22 0418 11/07/2022 0300 11/26/2022 0907 11/22/2022 1354 11/24/22 0226 11/24/22 1351 11/25/22 0317  NA  --  136 137 133* 137 135 134*  --  139  K  --  3.3* 3.0* 3.1* 3.7 3.3* 3.9  --  3.4*  CL  --  106 106 106 105 107 106  --  107  CO2  --  23 23 20*  --  23 18*  --  22  GLUCOSE  --  140* 170* 150* 88 118* 127*  --  162*  BUN  --  40* 44* 40* 35* 37* 37*  --  40*  CREATININE  --  2.70* 2.50* 2.11* 2.00* 1.99* 1.91*  --  1.96*  CALCIUM  --  8.1* 7.8* 7.4*  --  7.5* 7.5*  --  7.7*  MG 2.0 1.8 2.0 1.9  --   --   --  2.0 2.0  PHOS 1.9* 1.5* 2.6  --   --   --  2.0*  --  2.2*     Critical care time: 32 minutes   Levy Pupa, MD, PhD 11/25/2022, 8:19 AM [facility] [phone] or if no answer before 7:00PM call [phone] For any issues after 7:00PM please call eLink [phone]

## 2022-11-25 NOTE — Progress Notes (Signed)
[facility] Progress Note Patient Name: Samantha [name] DOB: Feb 03, 1956 MRN: [id number]   Date of Service  11/25/2022  HPI/Events of Note  Notified of abnormal labs.   K 3.4, crea 1.96.  Calcium 7.7 but with prior low albumin of <1.5, will not correct at this time.    eICU Interventions  34meq KCL via tube ordered.  Add on albumin.      Intervention Category Intermediate Interventions: Electrolyte abnormality - evaluation and management  Samantha [name] 11/25/2022, 4:38 AM

## 2022-11-26 ENCOUNTER — Inpatient Hospital Stay (HOSPITAL_COMMUNITY): Payer: Medicare HMO

## 2022-11-26 DIAGNOSIS — G9340 Encephalopathy, unspecified: Secondary | ICD-10-CM

## 2022-11-26 DIAGNOSIS — L8990 Pressure ulcer of unspecified site, unspecified stage: Secondary | ICD-10-CM | POA: Insufficient documentation

## 2022-11-26 DIAGNOSIS — I82409 Acute embolism and thrombosis of unspecified deep veins of unspecified lower extremity: Secondary | ICD-10-CM

## 2022-11-26 DIAGNOSIS — I82729 Chronic embolism and thrombosis of deep veins of unspecified upper extremity: Secondary | ICD-10-CM | POA: Insufficient documentation

## 2022-11-26 HISTORY — PX: IR IVC FILTER PLMT / S&I /IMG GUID/MOD SED: IMG701

## 2022-11-26 LAB — BPAM RBC
Blood Product Expiration Date: 202402142359
Blood Product Expiration Date: 202402142359
Blood Product Expiration Date: 202402162359
Blood Product Expiration Date: 202402162359
ISSUE DATE / TIME: 202401260509
ISSUE DATE / TIME: 202401260852
ISSUE DATE / TIME: 202401280241
ISSUE DATE / TIME: 202401281123
Unit Type and Rh: 6200
Unit Type and Rh: 6200
Unit Type and Rh: 6200
Unit Type and Rh: 6200

## 2022-11-26 LAB — COMPREHENSIVE METABOLIC PANEL
ALT: 22 U/L (ref 0–44)
AST: 24 U/L (ref 15–41)
Albumin: <1.5 g/dL — ABNORMAL LOW (ref 3.5–5.0)
Alkaline Phosphatase: 221 U/L — ABNORMAL HIGH (ref 38–126)
Anion gap: 11 (ref 5–15)
BUN: 42 mg/dL — ABNORMAL HIGH (ref 8–23)
CO2: 19 mmol/L — ABNORMAL LOW (ref 22–32)
Calcium: 7.6 mg/dL — ABNORMAL LOW (ref 8.9–10.3)
Chloride: 107 mmol/L (ref 98–111)
Creatinine, Ser: 1.95 mg/dL — ABNORMAL HIGH (ref 0.44–1.00)
GFR, Estimated: 28 mL/min — ABNORMAL LOW (ref >60)
Glucose, Bld: 189 mg/dL — ABNORMAL HIGH (ref 70–99)
Potassium: 4 mmol/L (ref 3.5–5.1)
Sodium: 137 mmol/L (ref 135–145)
Total Bilirubin: 0.9 mg/dL (ref 0.3–1.2)
Total Protein: 4.6 g/dL — ABNORMAL LOW (ref 6.5–8.1)

## 2022-11-26 LAB — CBC
HCT: 23 % — ABNORMAL LOW (ref 36.0–46.0)
Hemoglobin: 8 g/dL — ABNORMAL LOW (ref 12.0–15.0)
MCH: 33.5 pg (ref 26.0–34.0)
MCHC: 34.8 g/dL (ref 30.0–36.0)
MCV: 96.2 fL (ref 80.0–100.0)
Platelets: 46 10*3/uL — ABNORMAL LOW (ref 150–400)
RBC: 2.39 MIL/uL — ABNORMAL LOW (ref 3.87–5.11)
RDW: 18.7 % — ABNORMAL HIGH (ref 11.5–15.5)
WBC: 18.3 10*3/uL — ABNORMAL HIGH (ref 4.0–10.5)
nRBC: 0.5 % — ABNORMAL HIGH (ref 0.0–0.2)

## 2022-11-26 LAB — TYPE AND SCREEN
ABO/RH(D): A POS
Antibody Screen: NEGATIVE
Unit division: 0
Unit division: 0
Unit division: 0
Unit division: 0

## 2022-11-26 LAB — PHOSPHORUS: Phosphorus: 2.4 mg/dL — ABNORMAL LOW (ref 2.5–4.6)

## 2022-11-26 LAB — AEROBIC/ANAEROBIC CULTURE W GRAM STAIN (SURGICAL/DEEP WOUND): Gram Stain: NONE SEEN

## 2022-11-26 LAB — GLUCOSE, CAPILLARY
Glucose-Capillary: 166 mg/dL — ABNORMAL HIGH (ref 70–99)
Glucose-Capillary: 135 mg/dL — ABNORMAL HIGH (ref 70–99)
Glucose-Capillary: 132 mg/dL — ABNORMAL HIGH (ref 70–99)
Glucose-Capillary: 127 mg/dL — ABNORMAL HIGH (ref 70–99)
Glucose-Capillary: 160 mg/dL — ABNORMAL HIGH (ref 70–99)

## 2022-11-26 LAB — PREPARE RBC (CROSSMATCH)

## 2022-11-26 LAB — MAGNESIUM: Magnesium: 2 mg/dL (ref 1.7–2.4)

## 2022-11-26 MED ORDER — IOHEXOL 300 MG/ML  SOLN
100.0000 mL | Freq: Once | INTRAMUSCULAR | Status: AC | PRN
  Administered 2022-11-26: 55 mL via INTRAVENOUS

## 2022-11-26 MED ORDER — SODIUM CHLORIDE 0.9 % IV SOLN
2.0000 g | INTRAVENOUS | Status: DC
  Administered 2022-11-26: 2 g via INTRAVENOUS
  Filled 2022-11-26: qty 20

## 2022-11-26 MED ORDER — LIDOCAINE HCL 1 % IJ SOLN
INTRAMUSCULAR | Status: AC
  Administered 2022-11-26: 8 mL
  Filled 2022-11-26: qty 20

## 2022-11-26 MED ORDER — JUVEN PO PACK
1.0000 | PACK | Freq: Two times a day (BID) | ORAL | Status: DC
  Administered 2022-11-26 – 2022-12-01 (×11): 1
  Filled 2022-11-26 (×11): qty 1

## 2022-11-26 MED ORDER — THIAMINE MONONITRATE 100 MG PO TABS
100.0000 mg | ORAL_TABLET | Freq: Every day | ORAL | Status: AC
  Administered 2022-11-26 – 2022-11-30 (×5): 100 mg
  Filled 2022-11-26 (×5): qty 1

## 2022-11-26 MED ORDER — METOPROLOL TARTRATE 12.5 MG HALF TABLET
12.5000 mg | ORAL_TABLET | Freq: Two times a day (BID) | ORAL | Status: DC
  Administered 2022-11-26 – 2022-12-01 (×11): 12.5 mg
  Filled 2022-11-26 (×11): qty 1

## 2022-11-26 MED ORDER — METOPROLOL TARTRATE 25 MG/10 ML ORAL SUSPENSION: 12.5000 mg | Freq: Two times a day (BID) | ORAL | Status: DC

## 2022-11-26 NOTE — Progress Notes (Cosign Needed Addendum)
[facility] Progress Note  3 Days Post-Op  Subjective: CC-  No family at bedside.  Objective: Vital signs in last 24 hours: Temp:  [98 F (36.7 C)-98.5 F (36.9 C)] 98.5 F (36.9 C) (01/29 0817) Pulse Rate:  [82-136] 83 (01/29 1100) Resp:  [13-21] 14 (01/29 1100) BP: (85-142)/(53-81) 115/63 (01/29 1100) SpO2:  [93 %-100 %] 99 % (01/29 1100) Weight:  [112.4 kg] 112.4 kg (01/29 0116) Last BM Date : 11/26/22  Intake/Output from previous day: 01/28 0701 - 01/29 0700 In: 1456 [I.V.:203.3; NG/GT:590.2; IV Piggyback:662.5] Out: 895 [Urine:495; Stool:400] Intake/Output this shift: No intake/output data recorded.  PE: Gen:  Alert, NAD GU: left labial abscess s/p I&D with penrose drain present, some seropurulent drainage but no induration, fluctuance, or definite cellulitis. foley and rectal tube present, skin breakdown of thighs and buttocks with some skin tears present    Lab Results:  Recent Labs    11/25/22 0317 11/26/22 0334  WBC 17.1* 18.3*  HGB 7.8* 8.0*  HCT 22.8* 23.0*  PLT 44* 46*   BMET Recent Labs    11/25/22 1827 11/25/22 2317  NA 136 137  K 3.8 4.0  CL 107 107  CO2 20* 19*  GLUCOSE 193* 189*  BUN 41* 42*  CREATININE 1.95* 1.95*  CALCIUM 7.6* 7.6*   PT/INR No results for input(s): "LABPROT", "INR" in the last 72 hours. CMP     Component Value Date/Time   NA 137 11/25/2022 2317   NA 145 (H) 08/13/2022 1116   K 4.0 11/25/2022 2317   CL 107 11/25/2022 2317   CO2 19 (L) 11/25/2022 2317   GLUCOSE 189 (H) 11/25/2022 2317   BUN 42 (H) 11/25/2022 2317   BUN 8 08/13/2022 1116   CREATININE 1.95 (H) 11/25/2022 2317   CALCIUM 7.6 (L) 11/25/2022 2317   PROT 4.6 (L) 11/25/2022 2317   PROT 6.9 08/13/2022 1116   ALBUMIN <1.5 (L) 11/25/2022 2317   ALBUMIN 3.0 (L) 08/13/2022 1116   AST 24 11/25/2022 2317   ALT 22 11/25/2022 2317   ALKPHOS 221 (H) 11/25/2022 2317   BILITOT 0.9 11/25/2022 2317   BILITOT 0.4 08/13/2022 1116   GFRNONAA 28 (L)  11/25/2022 2317   GFRAA 84 11/18/2019 1607   Lipase  No results found for: "LIPASE"     Studies/Results: MR BRAIN WO CONTRAST  Result Date: 11/24/2022 CLINICAL DATA:  Neuro deficit, acute, stroke suspected. New right-sided facial droop. EXAM: MRI HEAD WITHOUT CONTRAST TECHNIQUE: Multiplanar, multiecho pulse sequences of the brain and surrounding structures were obtained without intravenous contrast. COMPARISON:  CT head without contrast 11/19/2022 MR head without contrast 01/14/2022 FINDINGS: Brain: Remote infarcts of the left corona radiata and inferior right cerebellum again noted. No acute infarcts are present. Susceptibility suggests some degree of remote blood products at both infarcts. Expected evolution of T2 changes noted about the left corona radiata infarct. White matter changes extend along the cortical spinal tracts to the medulla, new from the prior exam. A nonhemorrhagic infarct involving the left thalamus is also new the prior exam, but not acute. Ex vacuo dilation of the left lateral ventricle noted. Ventricles are otherwise proportionate to the degree of atrophy. No significant extra-axial fluid collections are present. No acute cerebellar abnormalities are present. Midline structures are within normal limits. Vascular: Flow is present in the major intracranial arteries. Skull and upper cervical spine: Mild degenerative changes are present in the upper cervical spine. Craniocervical junction is normal. Marrow signal is within normal limits. Sinuses/Orbits: The  left frontal sinus is opacified. Maxillary sinuses are shrunken bilaterally, compatible with chronic disease. No acute superimposed disease is present. Small mastoid effusions are present bilaterally. No obstructing nasopharyngeal lesion is present. The globes and orbits are within normal limits. IMPRESSION: 1. No acute intracranial abnormality. 2. Remote infarcts of the left corona radiata and inferior right cerebellum with  expected evolution about the infarct of the left corona radiata. Remote blood products are evident within both infarcts. 3. Interval left thalamic nonhemorrhagic infarct. 4. White matter changes extending along the cortical spinal tracts to the medulla is new from the prior exam, consistent with wallerian degeneration. 5. Small mastoid effusions bilaterally. No obstructing nasopharyngeal lesion is present. Electronically Signed   By: Marin Roberts M.D.   On: 11/24/2022 15:01    Anti-infectives: Anti-infectives (From admission, onward)    Start     Dose/Rate Route Frequency Ordered Stop   11/26/22 2000  cefTRIAXone (ROCEPHIN) 2 g in sodium chloride 0.9 % 100 mL IVPB        2 g 200 mL/hr over 30 Minutes Intravenous Every 24 hours 11/26/22 0807     11/25/22 0843  fluconazole (DIFLUCAN) IVPB 200 mg        200 mg 100 mL/hr over 60 Minutes Intravenous Every 24 hours 11/25/22 0843     11/24/22 0800  micafungin (MYCAMINE) 100 mg in sodium chloride 0.9 % 100 mL IVPB  Status:  Discontinued        100 mg 105 mL/hr over 1 Hours Intravenous Daily 11/24/22 0554 11/25/22 0843   11/24/22 0100  vancomycin (VANCOREADY) IVPB 1500 mg/300 mL  Status:  Discontinued        1,500 mg 150 mL/hr over 120 Minutes Intravenous  Once 11/07/2022 1532 11/22/2022 1600   11/24/22 0100  vancomycin (VANCOREADY) IVPB 2000 mg/400 mL        2,000 mg 200 mL/hr over 120 Minutes Intravenous  Once 11/11/2022 1600 11/24/22 0305   11/05/2022 1530  vancomycin variable dose per unstable renal function (pharmacist dosing)  Status:  Discontinued         Does not apply See admin instructions 10/30/2022 1532 11/25/22 0824   11/22/22 2200  metroNIDAZOLE (FLAGYL) IVPB 500 mg        500 mg 100 mL/hr over 60 Minutes Intravenous Every 12 hours 11/22/22 1030     11/20/22 1215  metroNIDAZOLE (FLAGYL) IVPB 500 mg  Status:  Discontinued        500 mg 100 mL/hr over 60 Minutes Intravenous Every 8 hours 11/20/22 1128 11/22/22 1030   11/19/22 2000   ceFEPIme (MAXIPIME) 2 g in sodium chloride 0.9 % 100 mL IVPB  Status:  Discontinued        2 g 200 mL/hr over 30 Minutes Intravenous Every 24 hours 11/03/2022 2215 11/26/22 0807   11/19/22 1400  linezolid (ZYVOX) IVPB 600 mg  Status:  Discontinued        600 mg 300 mL/hr over 60 Minutes Intravenous Every 12 hours 11/19/22 1306 11/02/2022 1515   11/19/22 0730  clindamycin (CLEOCIN) IVPB 600 mg  Status:  Discontinued       Note to Pharmacy: Dose accordingly for necrotizing fascitis   600 mg 100 mL/hr over 30 Minutes Intravenous Every 6 hours 11/19/22 0636 11/19/22 1306   11/03/2022 2215  vancomycin variable dose per unstable renal function (pharmacist dosing)  Status:  Discontinued         Does not apply See admin instructions 10/30/2022 2215 11/19/22 1306  12/14/22 1945  aztreonam (AZACTAM) 2 g in sodium chloride 0.9 % 100 mL IVPB  Status:  Discontinued        2 g 200 mL/hr over 30 Minutes Intravenous  Once 2022-12-14 1938 Dec 14, 2022 1944   14-Dec-2022 1945  metroNIDAZOLE (FLAGYL) IVPB 500 mg        500 mg 100 mL/hr over 60 Minutes Intravenous  Once 2022/12/14 1938 12/14/22 2115   12-14-2022 1945  vancomycin (VANCOCIN) IVPB 1000 mg/200 mL premix        1,000 mg 200 mL/hr over 60 Minutes Intravenous  Once 12-14-22 1938 Dec 14, 2022 2116   2022/12/14 1945  ceFEPIme (MAXIPIME) 2 g in sodium chloride 0.9 % 100 mL IVPB        2 g 200 mL/hr over 30 Minutes Intravenous  Once 12/14/22 1944 2022/12/14 2047        Assessment/Plan Left posterior labial abscess POD1 s/p I&D 1/26 Dr. Brantley Stage - labial abscess that tracks up along vagina, no communication between the labial abscess and the anal crypts or rectum  - Cxs E coli pansensitive, few budding yeast, final report pending. On rocephin, flagyl, diflucan - Continue penrose drain and dry dressing over top to collect any drainage (change BID and PRN saturation) - no indication for further debridement - Our team spoke with gyn last week and they will plan to see in  follow up. Recommend follow up with Dr. Damita Dunnings in about 3 weeks. We will sign off.   FEN: TF VTE: per primary ID: rocephin, flagyl, diflucan   - per CCM - E. Coli UTI AKI on CKD stage IIIa HTN HLD Hx of CVA with R sided deficits LLE DVT 12/2021 on Eliquis  Acute RLE DVT T2DM Severe protein calorie malnutrition Depression     LOS: 7 days    Wellington Hampshire, [facility] 11/26/2022, 12:34 PM Please see Amion for pager number during day hours 7:00am-4:30pm

## 2022-11-26 NOTE — Progress Notes (Signed)
Nutrition Follow-up  DOCUMENTATION CODES:  Severe malnutrition in context of chronic illness  INTERVENTION:  Continue tube feeding via cortrak tube: Vital 1.5 at 55 mL/hour (1320 ml goal daily volume) PROSource TF20 60 mL once daily per tube Goal regimen provides: 2060 kcal, 109 g of protein, 1003 mL H2O daily Thiamine 100mg  x 5 days, low phosphorus. Possible refeeding 1 packet Juven BID, each packet provides 95 calories, 2.5 grams of protein (collagen), and 9.8 grams of carbohydrate (3 grams sugar); also contains 7 grams of L-arginine and L-glutamine, 300 mg vitamin C, 15 mg vitamin E, 1.2 mcg vitamin B-12, 9.5 mg zinc, 200 mg calcium, and 1.5 g  Calcium Beta-hydroxy-Beta-methylbutyrate to support wound healing  NUTRITION DIAGNOSIS:   Severe Malnutrition related to chronic illness as evidenced by energy intake < or equal to 75% for > or equal to 1 month, percent weight loss, mild fat depletion, moderate muscle depletion. - remains applicable  GOAL:   Patient will meet greater than or equal to 90% of their needs - progressing, TF meeting needs  MONITOR:   Diet advancement, Labs, Weight trends, TF tolerance, Skin, I & O's  REASON FOR ASSESSMENT:   Consult Assessment of nutrition requirement/status, Enteral/tube feeding initiation and management  ASSESSMENT:  67 year old female with PMHx of HTN, HLD, diabetes type 2, LLE DVT (12/2021, previously on Eliquis), CVA with R-sided deficits, depression admitted with AMS, SSTI of perineal soft tissues, sepsis (urologic source vs progressive SSTI), AKI.  1/22: Cortrak tube placed  1/27 and 1/29 - SLP evaluation, NPO  Pt resting in bed at the time of assessment. Unable to answer questions. SLP evaluated and still recommends NPO. Cortrak in place, TF currently infusing at 60ml/h - no documentation in chart as to the reason. RN reports she was told pt vomited. TF being re-advanced very slowly, will adjust as pt would benefit from full feeds  and has had feeds held for procedures this admission.    Intake/Output Summary (Last 24 hours) at 11/26/2022 1359 Last data filed at 11/26/2022 0700 Gross per 24 hour  Intake 966.92 ml  Output 820 ml  Net 146.92 ml  Net IO Since Admission: 26,315.51 mL [11/26/22 1359]  Nutritionally Relevant Medications: Scheduled Meds:  atorvastatin  80 mg Per Tube q1800   feeding supplement (PROSource TF20)  60 mL Per Tube Daily   insulin aspart  0-15 Units Subcutaneous Q4H   metoCLOPramide  5 mg Per Tube Q8H   Continuous Infusions:  feeding supplement (VITAL 1.5 CAL) 30 mL/hr at 11/26/22 0700   PRN Meds: ondansetron, polyethylene glycol  Labs Reviewed: BUN 42, creatinine 1.95 Phosphorus 2.4 CBG ranges from 162-187 mg/dL over the last 24 hours  NUTRITION - FOCUSED PHYSICAL EXAM: Flowsheet Row Most Recent Value  Orbital Region Mild depletion  Upper Arm Region Mild depletion  Thoracic and Lumbar Region No depletion  Buccal Region No depletion  Temple Region Moderate depletion  Clavicle Bone Region Moderate depletion  Clavicle and Acromion Bone Region Moderate depletion  Scapular Bone Region Unable to assess  Dorsal Hand Mild depletion  Patellar Region No depletion  Anterior Thigh Region No depletion  Posterior Calf Region Unable to assess  Edema (RD Assessment) Mild  Hair Reviewed  Eyes Reviewed  Mouth Unable to assess  Skin Reviewed  Nails Reviewed    Diet Order:   Diet Order     None       EDUCATION NEEDS:  Not appropriate for education at this time  Skin:  Skin  Assessment: Skin Integrity Issues: Skin Integrity Issues:: Stage II, Other (Comment) Stage II: right buttocks (2cm x 1cm x 0.2cm) Other: DTPI left burrocks (8cm x 7cm); 8cm x 9cm area of erythema with 5-6 small areas of partial thickness skin loss at sacrum; partial thickness skin loss right anterior thigh (3cm x 2.2cm x 0.1cm); erythema intertrigo with partial thickness skin loss at mons pubis  Last BM:  1/29  - type 7  Height:  Ht Readings from Last 1 Encounters:  11/22/22 5' (1.524 m)    Weight:  Wt Readings from Last 1 Encounters:  11/26/22 112.4 kg    Ideal Body Weight:  45.5 kg  BMI:  Body mass index is 48.39 kg/m.  Estimated Nutritional Needs:  Kcal:  1900-2100 kcal/d Protein:  100-115g/d Fluid:  2-2.2 L/day    Ranell Patrick, RD, LDN Clinical Dietitian RD pager # available in [facility]  After hours/weekend pager # available in [facility]

## 2022-11-26 NOTE — Progress Notes (Signed)
NAMELucille [name], MRN:  [id number], DOB:  August 05, 1956, LOS: 7 ADMISSION DATE:  2022-11-30, CONSULTATION DATE:  1/22 REFERRING MD:  Samantha [name], CHIEF COMPLAINT:  Confusion   History of Present Illness:  67 y/o female with multiple medical issues admitted on Nov 30, 2022 with sepsis and multi-organ failure in the setting of a right labial perineal abscess.  Required I&D on 1/26 by general surgery.  Pertinent  Medical History  CVA Hypertension DM2 DM2 Hyperlipidemia DVT  Significant Hospital Events: Including procedures, antibiotic start and stop dates in addition to other pertinent events   2022/11/30 - Presented to [facility] for AMS x 2 days. Elevated WBC, soft BP, dirty UA with frank pus on I&O cath. CT Head NAICA, CT A/P as above with concern for perianal ST infection. CCS consulted. Lactate uptrending > 9.0. PCCM consulted for ICU admission. Urine culture MDR e. Coli. blood uclutre positive for staph capitis 1/23 OBGYN consulted for concern for possible labia abscess; DVT US showing RLE dvt involving R femoral vein; BC 1/4 staph capitis; Urine culture showing E. Coli 1/26 surgical drainage and Penrose drain placement 1/26 wound culture >> budding yeast, rare GPC, rare GNR, rare GPR >> e. Coli pan sensitive 1/26 linezolid changed to vancomycin 1/27 MRI brain > no acute intracranial abnormality.  Remote infarcts in the left corona radiata and inferior right cerebellum with expected evolution and remote blood products.  White matter changes extending along the corticospinal tracts to the medulla, new from prior exam consistent with wallerian degeneration 1/28 remains aphasic, some discrepancy in family regarding her code   Interim History / Subjective:  Remains drowsy, encephalopathic  Objective   Blood pressure 101/67, pulse 93, temperature 98.4 F (36.9 C), temperature source Oral, resp. rate 13, height 5' (1.524 m), weight 112.4 kg, SpO2 99 %.        Intake/Output Summary (Last 24 hours) at 11/26/2022  0725 Last data filed at 11/26/2022 0700 Gross per 24 hour  Intake 1455.97 ml  Output 895 ml  Net 560.97 ml   Filed Weights   11/24/22 0225 11/25/22 0112 11/26/22 0116  Weight: 110.3 kg 111.4 kg 112.4 kg    Examination:  General:  Resting comfortably in bed HENT: NCAT OP clear PULM: CTA B, normal effort CV: RRR, no mgr GI: BS+, soft, nontender MSK: normal bulk and tone Neuro: awake, alert, no distress, MAEW   Resolved Hospital Problem list     Assessment & Plan:  Remains critically ill due to: Septic shock from perineal abscess, e. Coli UTI; shock improved Cefepime Flagyl Monitor hemodynamics  Atrial fibrillation with RVR Tele Monitor rate Restart metoprolol  Acute metabolic encephalopathy > persistent due to baseline stroke (sons say she has been aphasic, daughters says she'll sometimes speak?). No changes on MRI to suggest new stroke Minimize sedation Frequent orientation  AKI > improving Hypocalcemia Monitor BMET and UOP Replace electrolytes as needed  Thrombocytopenia Normocytic anemia R femoral vein DVT diagnosed 11/20/22 Monitor for bleeding Transfuse PRBC for Hgb < 7 gm/dL Will consult IR for IVC filter Would plan to retrieve IVC filter when platelets back to normal in 4-6 weeks and resume lifelong anticoagulation  History of CVA with residual right sided deficits PT  Hyeprtension Hyperlipidemia Monitor hemodynamics  Restart amlodipine as BP returns to baseline Atorvastatin Restart metoprolol today  R gluteal pressure wound POA Wound care  DM2 with hyperglycemia SSI to continue  Depression lexapro  Malnutrition, severe with severe hypoalbuminemia Tube feeding to continue  Best Practice (right click and "  Reselect all SmartList Selections" daily)   Diet/type: tubefeeds DVT prophylaxis: other : consult IR for IVC filter GI prophylaxis: N/A Lines: Central line and yes and it is still needed Foley:  Yes, and it is still needed Code  Status:  full code Last date of multidisciplinary goals of care discussion [1/28]  Critical care time: 40 minutes    Samantha Awkward, MD [place] [facility] Pager: [phone] Cell: [phone] After 7:00 pm call Elink  [phone]

## 2022-11-26 NOTE — Procedures (Signed)
Vascular and Interventional Radiology Procedure Note  Patient: Samantha [name] DOB: Mar 12, 1956 Medical Record Number: [id number] Note Date/Time: 11/26/22 2:56 PM   Performing Physician: Michaelle Birks, MD Assistant(s): None  Diagnosis: RLE DVT. Thrombocytopenia. Unable to anticoagulate  Procedure:   CENTRAL VENOGRAM INFERIOR VENA CAVA FILTER PLACEMENT   Anesthesia: Local Anesthetic Complications: None Estimated Blood Loss: Minimal Specimens: None  Findings:  - access via the RIGHT femoral vein. - successful placement of infrarenal IVC filter. - No obvious abnormality was identified at this time.  Plan: *IVC filters can cause complications when left in place for extended periods of time. If medically appropriate, recommend discontinuing filter prior to discharge.  *Please re-evaluate the patient for filter discontinuation when they are seen in follow up, and refer patient to Interventional Radiology for removal.   Final report to follow once all images are reviewed and compared with previous studies.  See detailed dictation with images in PACS. The patient tolerated the procedure well without incident or complication and was returned to ICU in stable condition.    Michaelle Birks, MD Vascular [facility] Interventional Radiology [facility] Radiology   Pager. [place]

## 2022-11-26 NOTE — TOC Progression Note (Signed)
Transition of Care [facility]) - Progression Note    Patient Details  Name: Samantha [name] MRN: [id number] Date of Birth: August 03, 1956  [facility]) CM/SW Contact  Cyndi Bender, RN Phone Number: 11/26/2022, 1:35 PM  Clinical Narrative:       Patient plan to have image-guided inferior vena cava filter placement today, patient continues  to be aphasic. TOC following.     Expected Discharge Plan and Services                                               Social Determinants of Health (SDOH) Interventions SDOH Screenings   Food Insecurity: No Food Insecurity (11/19/2022)  Housing: Low Risk  (11/19/2022)  Transportation Needs: No Transportation Needs (11/19/2022)  Utilities: Not At Risk (11/19/2022)  Alcohol Screen: Low Risk  (12/13/2020)  Depression (PHQ2-9): Medium Risk (10/15/2022)  Financial Resource Strain: Low Risk  (04/17/2022)  Tobacco Use: Low Risk  (11/22/2022)    Readmission Risk Interventions     No data to display

## 2022-11-26 NOTE — Progress Notes (Signed)
Speech Language Pathology Treatment: Dysphagia  Patient Details Name: Samantha [name] MRN: [id number] DOB: 10/07/1956 Today's Date: 11/26/2022 Time: 0347-4259 SLP Time Calculation (min) (ACUTE ONLY): 10 min  Assessment / Plan / Recommendation Clinical Impression  Pt with improving level of engagement today.  Mild xerostomia noted; SLP provided oral care prior to administration of PO trials.  Pt tolerated ice chips.  There was immediate cough following initial trial of thin liquid which was given by spoon with further spoon and straw sips there were no clinical s/s of aspiration.  With trial of puree, pt orally held bolus.  Pt did not benefit from verbal cuing.  SLP removed applesauce by suction.  Pt is not yet ready for PO diet or for instrumental if necessary.  Pt may have ice chips and small amounts of water by spoon in moderation, after good oral care, when fully awake/alert, with upright positioning and 1:1 assistance.    HPI HPI: 67 year old woman who presented to [facility] ED December 06, 2022 via EMS for AMS.  Pt found to be septic due to soft tissue infection around perineum. Head CT December 06, 2022 and CXR 1/23 with no acute findings. PMHx significant for HTN, HLD, T2DM, CVA with R-sided deficits, LLE DVT (12/2021, previously on Eliquis), depression. No hx dysphagia from prior CVA.      SLP Plan  Continue with current plan of care      Recommendations for follow up therapy are one component of a multi-disciplinary discharge planning process, led by the attending physician.  Recommendations may be updated based on patient status, additional functional criteria and insurance authorization.    Recommendations  Diet recommendations: NPO Medication Administration: Via alternative means                Oral Care Recommendations: Oral care QID;Oral care prior to ice chip/H20 Follow Up Recommendations: Follow physician's recommendations for discharge plan and follow up therapies (Continue ST at next level of  care) Assistance recommended at discharge: Frequent or constant Supervision/Assistance SLP Visit Diagnosis: Dysphagia, unspecified (R13.10);Dysphagia, oral phase (R13.11) Plan: Continue with current plan of care           Celedonio Savage, [place], [place] Office: [phone] 11/26/2022, 11:10 AM

## 2022-11-26 NOTE — Progress Notes (Signed)
[facility] P[facility]M  Updated daughter [name] by phone Discussed IVC filter given risk of bleeding with thrombocytopenia and inability to anticoagulate Questions answered  Roselie Awkward, MD [place] [facility] Pager: [phone] Cell: [phone] After 7:00 pm call Elink  [phone]

## 2022-11-26 NOTE — Consult Note (Signed)
Chief Complaint: Patient was seen in consultation today for  Chief Complaint  Patient presents with   Altered Mental Status   Referring Physician(s): Dr. Kendrick Fries  Supervising Physician: Ruel Favors  Patient Status: [facility] - In-pt  History of Present Illness: Samantha [name] is a 67 y.o. female with a medical history significant for depression, HTN, DM2, CVA and LLE DVT previously on Eliquis. She presented to the ED 12/07/2022 for evaluation of altered mental status. Work up in the ED was positive for developing sepsis secondary to right labial perineal abscess. She was also found to have a right femoral vein extremity DVT along with thrombocytopenia.   She was admitted for treatment and was taken to the OR 10/31/2022 for an I&D. Her platelet count remains low and she is unable to be anticoagulated at this time.  Interventional Radiology has been asked to evaluate this patient for an image-guided inferior vena cava filter placement. Imaging reviewed and procedure approved by Dr. Miles Costain.    Past Medical History:  Diagnosis Date   CVA (cerebral vascular accident) (HCC)    R-sided deficits   Deep vein thrombosis (DVT) (HCC)    Previously on Eliquis   HTN (hypertension)    Hyperlipidemia    T2DM (type 2 diabetes mellitus) (HCC)     Past Surgical History:  Procedure Laterality Date   TUBAL LIGATION     23 yrs ago    Allergies: Penicillins  Medications: Prior to Admission medications   Medication Sig Start Date End Date Taking? Authorizing Provider  acetaminophen (TYLENOL) 325 MG tablet Take 2 tablets (650 mg total) by mouth every 4 (four) hours as needed for mild pain (or temp > 37.5 C (99.5 F)). 02/12/22  Yes Angiulli, Mcarthur Rossetti, PA-C  amLODipine (NORVASC) 5 MG tablet Take 1 tablet (5 mg total) by mouth daily. 10/15/22  Yes Drubel, Lillia Abed, PA-C  anti-nausea (EMETROL) solution Take 10 mLs by mouth every 15 (fifteen) minutes as needed for nausea or vomiting.   Yes [provider]  aspirin (ASPIRIN 81) 81 MG chewable tablet Chew 1 tablet (81 mg total) by mouth daily. 08/16/22  Yes [name], PA-C  atorvastatin (LIPITOR) 80 MG tablet Take 1 tablet (80 mg total) by mouth daily at 6 PM. 08/13/22  Yes Drubel, Lillia Abed, PA-C  Baclofen 5 MG TABS Take 5 mg by mouth at bedtime. 08/13/22  Yes Drubel, Lillia Abed, PA-C  BENZONATATE PO Take 1 capsule by mouth every 6 (six) hours as needed (coughing).   Yes [provider]  escitalopram (LEXAPRO) 10 MG tablet Take 1 tablet (10 mg total) by mouth daily. 08/13/22  Yes [name], PA-C  Insulin Glargine (BASAGLAR KWIKPEN) 100 UNIT/ML Inject 16 Units into the skin daily. 08/13/22  Yes [name], PA-C  MELATONIN PO Take 1 tablet by mouth at bedtime.   Yes [provider]  metoprolol tartrate (LOPRESSOR) 50 MG tablet Take 1 tablet (50 mg total) by mouth 2 (two) times daily. 08/13/22  Yes Drubel, Lillia Abed, PA-C  ondansetron (ZOFRAN-ODT) 4 MG disintegrating tablet Take 1 tablet (4 mg total) by mouth every 8 (eight) hours as needed for nausea or vomiting. 11/12/22  Yes Drubel, Lillia Abed, PA-C  VITAMIN D PO Take 1 tablet by mouth daily.   Yes [provider]  Accu-Chek Softclix Lancets lancets Use to test blood sugars 4 times daily. 02/13/22   Angiulli, Mcarthur Rossetti, PA-C  Blood Pressure Monitoring DEVI Check bp in AM and PM 03/01/22   [name], PA-C  dextromethorphan 15 MG/5ML syrup Take 10 mLs (30 mg total) by mouth 4 (four) times daily as needed for cough. Patient not taking: Reported on 11/19/2022 10/26/22   Simmons-Robinson, Tawanna Cooler, MD  glucose blood (ACCU-CHEK GUIDE) test strip Use to test blood sugars 4 times daily. 02/13/22   Angiulli, Mcarthur Rossetti, PA-C  Insulin Pen Needle 32G X 4 MM MISC Use to inject insulin up to 4 times daily. 08/13/22   [name], PA-C  nystatin cream (MYCOSTATIN) Apply 1 Application topically 2 (two) times daily. Use on area of concern. Can use with steroid  cream Patient not taking: Reported on 11/19/2022 10/15/22   [name], PA-C  triamcinolone cream (KENALOG) 0.1 % Apply 1 Application topically 2 (two) times daily. Use on area of concern. Can use with nystatin. Do not use for more than 10 days in a row Patient not taking: Reported on 11/19/2022 10/15/22   [name], PA-C     Family History  Problem Relation Age of Onset   Hypertension Mother     Social History   Socioeconomic History   Marital status: Married    Spouse name: Not on file   Number of children: Not on file   Years of education: Not on file   Highest education level: Not on file  Occupational History   Not on file  Tobacco Use   Smoking status: Never   Smokeless tobacco: Never  Substance and Sexual Activity   Alcohol use: Never   Drug use: Never   Sexual activity: Not on file  Other Topics Concern   Not on file  Social History Narrative   Not on file   Social Determinants of Health   Financial Resource Strain: Low Risk  (04/17/2022)   Overall Financial Resource Strain (CARDIA)    Difficulty of Paying Living Expenses: Not hard at all  Food Insecurity: No Food Insecurity (11/19/2022)   Hunger Vital Sign    Worried About Running Out of Food in the Last Year: Never true    Ran Out of Food in the Last Year: Never true  Transportation Needs: No Transportation Needs (11/19/2022)   PRAPARE - Administrator, Civil Service (Medical): No    Lack of Transportation (Non-Medical): No  Physical Activity: Not on file  Stress: Not on file  Social Connections: Not on file    Review of Systems: A 12 point ROS discussed and pertinent positives are indicated in the HPI above.  All other systems are negative.  Review of Systems  Unable to perform ROS: Patient nonverbal    Vital Signs: BP 115/63   Pulse 83   Temp 98.5 F (36.9 C) (Oral)   Resp 14   Ht 5' (1.524 m)   Wt 247 lb 12.8 oz (112.4 kg)   SpO2 99%   BMI 48.39 kg/m   Physical  Exam Constitutional:      General: She is not in acute distress. HENT:     Nose:     Comments: Cortrak in left nostril.     Mouth/Throat:     Mouth: Mucous membranes are moist.     Pharynx: Oropharynx is clear.  Cardiovascular:     Rate and Rhythm: Normal rate and regular rhythm.     Pulses: Normal pulses.     Heart sounds: Normal heart sounds.     Comments: Left upper arm PICC.  Pulmonary:     Effort: Pulmonary effort is normal.  Abdominal:     General: Bowel sounds  are normal.     Palpations: Abdomen is soft.     Tenderness: There is no abdominal tenderness.  Musculoskeletal:        General: Swelling present.     Right lower leg: Edema present.     Left lower leg: Edema present.     Comments: Right arm edema   Skin:    General: Skin is warm and dry.  Neurological:     Mental Status: She is alert. She is disoriented.     Comments: Non-verbal. Only command she followed was to squeeze my hand with her left hand.      Imaging: MR BRAIN WO CONTRAST  Result Date: 11/24/2022 CLINICAL DATA:  Neuro deficit, acute, stroke suspected. New right-sided facial droop. EXAM: MRI HEAD WITHOUT CONTRAST TECHNIQUE: Multiplanar, multiecho pulse sequences of the brain and surrounding structures were obtained without intravenous contrast. COMPARISON:  CT head without contrast 11/19/2022 MR head without contrast 01/14/2022 FINDINGS: Brain: Remote infarcts of the left corona radiata and inferior right cerebellum again noted. No acute infarcts are present. Susceptibility suggests some degree of remote blood products at both infarcts. Expected evolution of T2 changes noted about the left corona radiata infarct. White matter changes extend along the cortical spinal tracts to the medulla, new from the prior exam. A nonhemorrhagic infarct involving the left thalamus is also new the prior exam, but not acute. Ex vacuo dilation of the left lateral ventricle noted. Ventricles are otherwise proportionate to the  degree of atrophy. No significant extra-axial fluid collections are present. No acute cerebellar abnormalities are present. Midline structures are within normal limits. Vascular: Flow is present in the major intracranial arteries. Skull and upper cervical spine: Mild degenerative changes are present in the upper cervical spine. Craniocervical junction is normal. Marrow signal is within normal limits. Sinuses/Orbits: The left frontal sinus is opacified. Maxillary sinuses are shrunken bilaterally, compatible with chronic disease. No acute superimposed disease is present. Small mastoid effusions are present bilaterally. No obstructing nasopharyngeal lesion is present. The globes and orbits are within normal limits. IMPRESSION: 1. No acute intracranial abnormality. 2. Remote infarcts of the left corona radiata and inferior right cerebellum with expected evolution about the infarct of the left corona radiata. Remote blood products are evident within both infarcts. 3. Interval left thalamic nonhemorrhagic infarct. 4. White matter changes extending along the cortical spinal tracts to the medulla is new from the prior exam, consistent with wallerian degeneration. 5. Small mastoid effusions bilaterally. No obstructing nasopharyngeal lesion is present. Electronically Signed   By: Marin Roberts M.D.   On: 11/24/2022 15:01   VAS [place] LOWER EXTREMITY VENOUS (DVT)  Result Date: 11/20/2022  Lower Venous DVT Study Patient Name:  JACKLYN BRANAN  Date of Exam:   11/20/2022 Medical Rec #: [id number]      Accession #:    [id number] Date of Birth: [birth year]      Patient Gender: F Patient Age:   42 years Exam Location:  [facility] Procedure:      VAS [place] LOWER EXTREMITY VENOUS (DVT) Referring Phys: Karie Fetch --------------------------------------------------------------------------------  Indications: Swelling, and Edema. Other Indications: History of small bowel obstruction. Comparison Study: 01/22/22 Left lower ext  venous positive for acute DVT in                   PTV/PERO veins. Performing Technologist: Marilynne Halsted RDMS, RVT  Examination Guidelines: A complete evaluation includes B-mode imaging, spectral Doppler, color Doppler, and power Doppler as needed of all accessible  portions of each vessel. Bilateral testing is considered an integral part of a complete examination. Limited examinations for reoccurring indications may be performed as noted. The reflux portion of the exam is performed with the patient in reverse Trendelenburg.  +---------+---------------+---------+-----------+----------+-------------------+ RIGHT    CompressibilityPhasicitySpontaneityPropertiesThrombus Aging      +---------+---------------+---------+-----------+----------+-------------------+ CFV      None           No       No                   Acute               +---------+---------------+---------+-----------+----------+-------------------+ SFJ      Partial                                                          +---------+---------------+---------+-----------+----------+-------------------+ FV Prox  None           No       No                   Age Indeterminate   +---------+---------------+---------+-----------+----------+-------------------+ FV Mid   None           No       No                   Age Indeterminate   +---------+---------------+---------+-----------+----------+-------------------+ FV DistalNone           No       No                   Age Indeterminate   +---------+---------------+---------+-----------+----------+-------------------+ PFV                                                   Not well visualized +---------+---------------+---------+-----------+----------+-------------------+ POP      None           No       No                   Age Indeterminate   +---------+---------------+---------+-----------+----------+-------------------+ PTV      Partial                                                           +---------+---------------+---------+-----------+----------+-------------------+ PERO     Partial                                      poorly visualized   +---------+---------------+---------+-----------+----------+-------------------+ REIV     None                                         Age Indeterminate   +---------+---------------+---------+-----------+----------+-------------------+ Right iliac veins were poorly visualized due to patient's body habitus.  +----+---------------+---------+-----------+----------+--------------+ LEFTCompressibilityPhasicitySpontaneityPropertiesThrombus Aging +----+---------------+---------+-----------+----------+--------------+ CFV Full  Yes      Yes                                 +----+---------------+---------+-----------+----------+--------------+ SFJ Full                                                        +----+---------------+---------+-----------+----------+--------------+     Summary: RIGHT: - Findings consistent with acute deep vein thrombosis involving the right common femoral vein. - Findings consistent with age indeterminate deep vein thrombosis involving the right femoral vein, right popliteal vein, right posterior tibial veins, right peroneal veins, and external iliac vein.  LEFT: - No evidence of common femoral vein obstruction.  *See table(s) above for measurements and observations. Electronically signed by Waverly Ferrari MD on 11/20/2022 at 4:24:23 PM.    Final    [place] EKG SITE RITE  Result Date: 11/20/2022 If Site Rite image not attached, placement could not be confirmed due to current cardiac rhythm.  DG Chest Port 1 View  Result Date: 11/20/2022 CLINICAL DATA:  Shortness of breath EXAM: PORTABLE CHEST 1 VIEW COMPARISON:  Chest x-ray dated November 22, 2022 FINDINGS: Feeding tube tip is in the stomach. The heart size and mediastinal contours are within normal limits.  Both lungs are clear. The visualized skeletal structures are unremarkable. IMPRESSION: No active disease. Feeding tube tip is in the stomach. Electronically Signed   By: Allegra Lai M.D.   On: 11/20/2022 08:21   DG Abd Portable 1V  Result Date: 11/19/2022 CLINICAL DATA:  Encounter for feeding tube placement EXAM: PORTABLE ABDOMEN - 1 VIEW COMPARISON:  None Available. FINDINGS: Feeding tube with tip in the gastric fundus.  No bowel obstruction. IMPRESSION: Feeding tube in stomach as above Electronically Signed   By: Genevive Bi M.D.   On: 11/19/2022 16:22   DG Abd Portable 1V  Result Date: 11/19/2022 CLINICAL DATA:  Feeding tube placement EXAM: PORTABLE ABDOMEN - 1 VIEW COMPARISON:  Abdominal radiograph dated January 27, 2022 FINDINGS: Feeding tube tip is in the stomach. Visualized bowel gas pattern is normal. No radio-opaque calculi or other significant radiographic abnormality are seen. IMPRESSION: Feeding tube tip is in the stomach. Electronically Signed   By: Allegra Lai M.D.   On: 11/19/2022 15:30   US RENAL  Result Date: 11/19/2022 CLINICAL DATA:  Oliguria EXAM: RENAL / URINARY TRACT ULTRASOUND COMPLETE COMPARISON:  01/14/2022 renal ultrasound FINDINGS: Right Kidney: Renal measurements: 12.1 x 5.4 x 5.4 cm = volume: 182 mL, previously 230 mL. Echogenicity within normal limits. No mass or hydronephrosis visualized. Left Kidney: Renal measurements: 8.3 x 4.3 x 3.2 cm = volume: 60 mL, previously 100 mL. The kidney is small and increased in echogenicity, with a contour suggestive of scarring. No mass or hydronephrosis visualized. Bladder: The bladder contains a Foley and is decompressed. Other: None. IMPRESSION: 1. Left renal scarring and atrophy, which has progressed from the prior exam 2. No acute abnormality in the right kidney. Electronically Signed   By: Wiliam Ke M.D.   On: 11/19/2022 11:38   CT CHEST ABDOMEN PELVIS WO CONTRAST  Result Date: 11/19/2022 CLINICAL DATA:  Sepsis  EXAM: CT CHEST, ABDOMEN AND PELVIS WITHOUT CONTRAST TECHNIQUE: Multidetector CT imaging of the chest, abdomen and pelvis was performed following the  standard protocol without IV contrast. RADIATION DOSE REDUCTION: This exam was performed according to the departmental dose-optimization program which includes automated exposure control, adjustment of the mA and/or kV according to patient size and/or use of iterative reconstruction technique. COMPARISON:  None Available. FINDINGS: CT CHEST FINDINGS Cardiovascular: Moderate multi-vessel coronary artery calcification. Global cardiac size within normal limits. No pericardial effusion. Central pulmonary arteries are enlarged in keeping with changes of pulmonary arterial hypertension. Mild atherosclerotic calcification within the thoracic aorta. No aortic aneurysm. Mediastinum/Nodes: No enlarged mediastinal, hilar, or axillary lymph nodes. Thyroid gland, trachea, and esophagus demonstrate no significant findings. Lungs/Pleura: Lungs are clear. No pleural effusion or pneumothorax. Musculoskeletal: No acute bone abnormality. No lytic or blastic bone lesion. CT ABDOMEN PELVIS FINDINGS Hepatobiliary: No focal liver abnormality is seen. No gallstones, gallbladder wall thickening, or biliary dilatation. Pancreas: Unremarkable. No pancreatic ductal dilatation or surrounding inflammatory changes. Spleen: Unremarkable Adrenals/Urinary Tract: The adrenal glands are unremarkable. The kidneys are normal in position. Marked left renal cortical scarring and atrophy. The right kidney is normal in size. Nonobstructing calculi are noted within the lower pole of the kidneys bilaterally measuring up to 7 mm on the left and 4 mm on the right. Additional vascular calcifications noted within the renal hila. No hydronephrosis. No ureteral calculi. The bladder is unremarkable. Stomach/Bowel: The stomach, small bowel, and large bowel are unremarkable. Appendix normal. No free intraperitoneal gas or  fluid. Vascular/Lymphatic: Mild aortoiliac atherosclerotic calcification. No aortic aneurysm. No pathologic adenopathy within the abdomen and pelvis. Reproductive: Uterus and bilateral adnexa are unremarkable. Other: There is extensive subcutaneous gas seen within the left peroneal soft tissues along the posterior aspect of the left labial majora and within the perianal subcutaneous fat. There is superimposed debris within this region best appreciated on image # 125/3 without discrete loculated fluid collection identified. The subcutaneous gas, suggestive of an aggressive soft tissue infection, extends to the left levator ani musculature, but does not appear to extend into the deep pelvis. Additionally there is extension of subcutaneous gas and inflammatory changes superficial to the sacrum to the level of the iliac spines bilaterally. There is asymmetric extensive subcutaneous edema within the a dependent subcutaneous fat of the proximal right thigh which may relate to dependent positioning or a localized inflammatory process. No loculated fluid collection or subcutaneous gas noted in this region. Musculoskeletal: No acute bone abnormality. No lytic or blastic bone lesion. IMPRESSION: 1. Extensive subcutaneous gas and debris within the left perineal soft tissues along the posterior aspect of the left labia majora and within the perianal subcutaneous in keeping with an aggressive soft tissue infection. Inflammatory change extends to the left levator ani musculature, but does not appear to extend into the deep pelvis, and extends superficial to the sacrum to the level of the iliac spines. No loculated subcutaneous fluid collection identified. 2. Asymmetric extensive subcutaneous edema within the dependent subcutaneous fat of the proximal right thigh which may relate to dependent positioning or a localized inflammatory process. No loculated fluid collection or subcutaneous gas noted in this region. 3. Moderate  multi-vessel coronary artery calcification. 4. Morphologic changes in keeping with pulmonary arterial hypertension. 5. Marked left renal cortical scarring and atrophy. Mild bilateral nonobstructing nephrolithiasis. 6.  Aortic Atherosclerosis (ICD10-I70.0). Electronically Signed   By: Fidela Salisbury M.D.   On: 11/19/2022 00:29   CT HEAD WO CONTRAST (5MM)  Result Date: 11/19/2022 CLINICAL DATA:  Altered mental status EXAM: CT HEAD WITHOUT CONTRAST TECHNIQUE: Contiguous axial images were obtained from the base  of the skull through the vertex without intravenous contrast. RADIATION DOSE REDUCTION: This exam was performed according to the departmental dose-optimization program which includes automated exposure control, adjustment of the mA and/or kV according to patient size and/or use of iterative reconstruction technique. COMPARISON:  None Available. FINDINGS: Brain: There is no mass, hemorrhage or extra-axial collection. There is generalized atrophy without lobar predilection. Hypodensity of the white matter is most commonly associated with chronic microvascular disease. 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 Vascular: No abnormal hyperdensity of the major intracranial arteries or dural venous sinuses. No intracranial atherosclerosis. Skull: The visualized skull base, calvarium and extracranial soft tissues are normal. Sinuses/Orbits: No fluid levels or advanced mucosal thickening of the visualized paranasal sinuses. No mastoid or middle ear effusion. The orbits are normal. IMPRESSION: 1. No acute intracranial abnormality. 2. 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 3. Chronic microvascular ischemia. Electronically Signed   By: Deatra Robinson M.D.   On: 11/19/2022 00:13   DG Chest Port 1 View  Result Date: 05-Dec-2022 CLINICAL DATA:  Sepsis, became nonverbal, dehydrated EXAM: PORTABLE CHEST 1 VIEW COMPARISON:  Portable exam 1951 hours compared to  04/13/2022 FINDINGS: Normal heart size, mediastinal contours, and pulmonary vascularity. Atherosclerotic calcification aorta. Lungs clear. No infiltrate, pleural effusion, or pneumothorax. No acute osseous findings. IMPRESSION: No acute abnormalities. Aortic Atherosclerosis (ICD10-I70.0). Electronically Signed   By: Ulyses Southward M.D.   On: 12-05-2022 20:15    Labs:  CBC: Recent Labs    11/11/2022 1354 11/24/22 0226 11/25/22 0317 11/26/22 0334  WBC 26.9* 29.1* 17.1* 18.3*  HGB 8.1* 8.3* 7.8* 8.0*  HCT 23.8* 24.5* 22.8* 23.0*  PLT 55* 60* 44* 46*    COAGS: Recent Labs    01/13/22 1347 12-05-2022 1925  INR 1.1 1.5*  APTT  --  30    BMP: Recent Labs    11/24/22 0226 11/25/22 0317 11/25/22 1827 11/25/22 2317  NA 134* 139 136 137  K 3.9 3.4* 3.8 4.0  CL 106 107 107 107  CO2 18* 22 20* 19*  GLUCOSE 127* 162* 193* 189*  BUN 37* 40* 41* 42*  CALCIUM 7.5* 7.7* 7.6* 7.6*  CREATININE 1.91* 1.96* 1.95* 1.95*  GFRNONAA 29* 28* 28* 28*    LIVER FUNCTION TESTS: Recent Labs    11/20/22 0721 11/21/22 0252 11/22/22 0418 11/25/22 0500 11/25/22 2317  BILITOT 1.1 0.9 0.6  --  0.9  AST 40 30 33  --  24  ALT 33 27 27  --  22  ALKPHOS 155* 120 124  --  221*  PROT 5.5* 4.4* 4.5*  --  4.6*  ALBUMIN 1.8* <1.5* <1.5* <1.5* <1.5*    TUMOR MARKERS: No results for input(s): "AFPTM", "CEA", "CA199", "CHROMGRNA" in the last 8760 hours.  Assessment and Plan:  Right femoral vein DVT; thrombocytopenia prohibiting anticoagulation: Sharise Bays, 67 year old female, is scheduled today for an image-guided inferior vena cava filter placement. Telephone consent was obtained from the patient's husband. This procedure will be done with local anesthesia only.   Risks and benefits of inferior vena cava filter placement were discussed with the patient including, but not limited to bleeding, infection, vascular injury or contrast induced renal failure.  This interventional procedure involves the use  of X-rays and because of the nature of the planned procedure, it is possible that we will have prolonged use of X-ray fluoroscopy.  Potential radiation risks to you include (but are not limited to) the following: -  A slightly elevated risk for cancer  several years later in life. This risk is typically less than 0.5% percent. This risk is low in comparison to the normal incidence of human cancer, which is 33% for women and 50% for men according to the [employer]. - Radiation induced injury can include skin redness, resembling a rash, tissue breakdown / ulcers and hair loss (which can be temporary or permanent).   The likelihood of either of these occurring depends on the difficulty of the procedure and whether you are sensitive to radiation due to previous procedures, disease, or genetic conditions.   IF your procedure requires a prolonged use of radiation, you will be notified and given written instructions for further action.  It is your responsibility to monitor the irradiated area for the 2 weeks following the procedure and to notify your physician if you are concerned that you have suffered a radiation induced injury.    All of the patient's questions were answered, patient is agreeable to proceed.  Consent signed and in IR.   Thank you for this interesting consult.  I greatly enjoyed meeting Jermiyah Dario and look forward to participating in their care.  A copy of this report was sent to the requesting provider on this date.  Electronically Signed: Alwyn Ren, AGACNP-BC [phone] 11/26/2022, 12:48 PM   I spent a total of 20 Minutes    in face to face in clinical consultation, greater than 50% of which was counseling/coordinating care for IVC filter placement

## 2022-11-27 ENCOUNTER — Inpatient Hospital Stay (HOSPITAL_COMMUNITY): Payer: Medicare HMO

## 2022-11-27 DIAGNOSIS — D696 Thrombocytopenia, unspecified: Secondary | ICD-10-CM | POA: Insufficient documentation

## 2022-11-27 DIAGNOSIS — E43 Unspecified severe protein-calorie malnutrition: Secondary | ICD-10-CM

## 2022-11-27 DIAGNOSIS — M7989 Other specified soft tissue disorders: Secondary | ICD-10-CM

## 2022-11-27 DIAGNOSIS — R6521 Severe sepsis with septic shock: Secondary | ICD-10-CM | POA: Diagnosis not present

## 2022-11-27 DIAGNOSIS — R4182 Altered mental status, unspecified: Secondary | ICD-10-CM | POA: Diagnosis not present

## 2022-11-27 DIAGNOSIS — A4151 Sepsis due to Escherichia coli [E. coli]: Secondary | ICD-10-CM | POA: Diagnosis not present

## 2022-11-27 LAB — BASIC METABOLIC PANEL
Anion gap: 5 (ref 5–15)
BUN: 52 mg/dL — ABNORMAL HIGH (ref 8–23)
CO2: 22 mmol/L (ref 22–32)
Calcium: 7.6 mg/dL — ABNORMAL LOW (ref 8.9–10.3)
Chloride: 110 mmol/L (ref 98–111)
Creatinine, Ser: 1.93 mg/dL — ABNORMAL HIGH (ref 0.44–1.00)
GFR, Estimated: 28 mL/min — ABNORMAL LOW (ref >60)
Glucose, Bld: 192 mg/dL — ABNORMAL HIGH (ref 70–99)
Potassium: 4 mmol/L (ref 3.5–5.1)
Sodium: 137 mmol/L (ref 135–145)

## 2022-11-27 LAB — CBC WITH DIFFERENTIAL/PLATELET
Abs Immature Granulocytes: 0.16 10*3/uL — ABNORMAL HIGH (ref 0.00–0.07)
Basophils Absolute: 0 10*3/uL (ref 0.0–0.1)
Basophils Relative: 0 %
Eosinophils Absolute: 0.1 10*3/uL (ref 0.0–0.5)
Eosinophils Relative: 1 %
HCT: 22.9 % — ABNORMAL LOW (ref 36.0–46.0)
Hemoglobin: 7.7 g/dL — ABNORMAL LOW (ref 12.0–15.0)
Immature Granulocytes: 1 %
Lymphocytes Relative: 10 %
Lymphs Abs: 1.6 10*3/uL (ref 0.7–4.0)
MCH: 33.3 pg (ref 26.0–34.0)
MCHC: 33.6 g/dL (ref 30.0–36.0)
MCV: 99.1 fL (ref 80.0–100.0)
Monocytes Absolute: 1.9 10*3/uL — ABNORMAL HIGH (ref 0.1–1.0)
Monocytes Relative: 12 %
Neutro Abs: 11.5 10*3/uL — ABNORMAL HIGH (ref 1.7–7.7)
Neutrophils Relative %: 76 %
Platelets: 46 10*3/uL — ABNORMAL LOW (ref 150–400)
RBC: 2.31 MIL/uL — ABNORMAL LOW (ref 3.87–5.11)
RDW: 18.9 % — ABNORMAL HIGH (ref 11.5–15.5)
WBC: 15.2 10*3/uL — ABNORMAL HIGH (ref 4.0–10.5)
nRBC: 0.6 % — ABNORMAL HIGH (ref 0.0–0.2)

## 2022-11-27 LAB — CULTURE, BLOOD (ROUTINE X 2)
Culture: NO GROWTH
Culture: NO GROWTH
Special Requests: ADEQUATE
Special Requests: ADEQUATE

## 2022-11-27 LAB — GLUCOSE, CAPILLARY
Glucose-Capillary: 164 mg/dL — ABNORMAL HIGH (ref 70–99)
Glucose-Capillary: 171 mg/dL — ABNORMAL HIGH (ref 70–99)
Glucose-Capillary: 175 mg/dL — ABNORMAL HIGH (ref 70–99)
Glucose-Capillary: 182 mg/dL — ABNORMAL HIGH (ref 70–99)
Glucose-Capillary: 189 mg/dL — ABNORMAL HIGH (ref 70–99)
Glucose-Capillary: 190 mg/dL — ABNORMAL HIGH (ref 70–99)
Glucose-Capillary: 249 mg/dL — ABNORMAL HIGH (ref 70–99)

## 2022-11-27 MED ORDER — SODIUM CHLORIDE 0.9 % IV SOLN
150.0000 mg | Freq: Every day | INTRAVENOUS | Status: DC
  Administered 2022-11-27 – 2022-12-01 (×5): 150 mg via INTRAVENOUS
  Filled 2022-11-27 (×5): qty 7.5

## 2022-11-27 MED ORDER — CEFAZOLIN SODIUM-DEXTROSE 2-4 GM/100ML-% IV SOLN
2.0000 g | Freq: Three times a day (TID) | INTRAVENOUS | Status: DC
  Administered 2022-11-27 – 2022-12-01 (×12): 2 g via INTRAVENOUS
  Filled 2022-11-27 (×15): qty 100

## 2022-11-27 NOTE — Progress Notes (Signed)
Upper extremity venous duplex has been completed.   Preliminary results in CV Proc.   Sindia Kowalczyk Shanvi Moyd 11/27/2022 2:26 PM

## 2022-11-27 NOTE — Progress Notes (Signed)
Physical Therapy Treatment Patient Details Name: Samantha [name] MRN: [id number] DOB: 07-May-1956 Today's Date: 11/27/2022   History of Present Illness 67 y.o. F admitted 11/19/2022 with sepsis due to UTI and extensive subcutaneous gas/debris within L perineal soft tissue, tracking along posterior Lt labia majora/perianal tissue. Nov 26, 2022 I&D of left labia. 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    PT Comments    Pt pleasant and smiling on arrival. Pt able to perform limited ROM with assist of left side and required +2 assist to roll and position in bed. Pt with progressive decline in function from being able to transfer to [facility] at baseline although does demonstrate improved participation from last session. SNF remains appropriate.     Recommendations for follow up therapy are one component of a multi-disciplinary discharge planning process, led by the attending physician.  Recommendations may be updated based on patient status, additional functional criteria and insurance authorization.  Follow Up Recommendations  Skilled nursing-short term rehab (<3 hours/day) Can patient physically be transported by private vehicle: No   Assistance Recommended at Discharge Frequent or constant Supervision/Assistance  Patient can return home with the following Two people to help with walking and/or transfers;Two people to help with bathing/dressing/bathroom;Assistance with cooking/housework;Direct supervision/assist for medications management;Direct supervision/assist for financial management;Assist for transportation;Help with stairs or ramp for entrance;Assistance with feeding   Equipment Recommendations  Hospital bed;Other (comment) (hoyer lift, air overlay)    Recommendations for Other Services       Precautions / Restrictions Precautions Precautions: Fall;Other (comment) Precaution Comments: cortrak, rt hemiparesis, multiple sacral/labial  wounds Restrictions Weight Bearing Restrictions: No     Mobility  Bed Mobility Overal bed mobility: Needs Assistance Bed Mobility: Rolling Rolling: Max assist, +2 for physical assistance         General bed mobility comments: max +2 to roll to right with assist to bend left knee and reach across body with LUE, unable to maintain sidelying with out mod assist. supine to sit with total assist of bed functions    Transfers                   General transfer comment: unable    Ambulation/Gait                   Stairs             Wheelchair Mobility    Modified Rankin (Stroke Patients Only)       Balance                                            Cognition Arousal/Alertness: Awake/alert Behavior During Therapy: Flat affect Overall Cognitive Status: History of cognitive impairments - at baseline                                 General Comments: pt able to answer yes and no questions grossly 50% of the time. following commands with delay for movement of Lt side        Exercises General Exercises - Upper Extremity Shoulder Flexion: AAROM, Left, Supine, 10 reps Elbow Flexion: AAROM, Left, Seated, 10 reps Elbow Extension: Left, Seated, 10 reps General Exercises - Lower Extremity Heel Slides: PROM, Left, Supine, 10 reps Hip ABduction/ADduction: PROM, Left, Supine, 10 reps  General Comments        Pertinent Vitals/Pain Pain Assessment Pain Assessment: CPOT Facial Expression: Relaxed, neutral Body Movements: Absence of movements Muscle Tension: Tense, rigid Compliance with ventilator (intubated pts.): N/A Vocalization (extubated pts.): Talking in normal tone or no sound CPOT Total: 1    Home Living                          Prior Function            PT Goals (current goals can now be found in the care plan section) Progress towards PT goals: Not progressing toward goals - comment     Frequency    Min 2X/week      PT Plan Current plan remains appropriate    Co-evaluation              AM-PAC PT "6 Clicks" Mobility   Outcome Measure  Help needed turning from your back to your side while in a flat bed without using bedrails?: Total Help needed moving from lying on your back to sitting on the side of a flat bed without using bedrails?: Total Help needed moving to and from a bed to a chair (including a wheelchair)?: Total Help needed standing up from a chair using your arms (e.g., wheelchair or bedside chair)?: Total Help needed to walk in hospital room?: Total   6 Click Score: 5    End of Session   Activity Tolerance: Patient tolerated treatment well Patient left: in bed;with call bell/phone within reach;with bed alarm set Nurse Communication: Mobility status PT Visit Diagnosis: Other abnormalities of gait and mobility (R26.89);Hemiplegia and hemiparesis     Time: 9480-1655 PT Time Calculation (min) (ACUTE ONLY): 21 min  Charges:  $Therapeutic Activity: 8-22 mins                     Bayard Males, PT Acute Rehabilitation Services Office: [phone]    Sandy Salaam Benedict Kue 11/27/2022, 10:12 AM

## 2022-11-27 NOTE — Progress Notes (Signed)
Progress Note    Samantha [name]   YKD:[id number]  DOB: May 09, 1956  DOA: 11/02/2022     8 PCP: [name], PA-C  Initial CC: AMS  Hospital Course: Samantha [name] is a 67 yo female with PMH CVA with residual R side deficits, HTN, DMII, HLD, LLE DVT (12/2021) who presented originally on 11/13/2022 with N/V and AMS. Significant Hospital Events:  1/21 - Presented to [facility] for AMS x 2 days. Elevated WBC, soft BP, dirty UA with frank pus on I&O cath. CT Head NAICA, CT A/P as above with concern for perianal ST infection. CCS consulted. Lactate uptrending > 9.0. PCCM consulted for ICU admission. Urine culture MDR e. Coli. blood uclutre positive for staph capitis 1/23 OBGYN consulted for concern for possible labia abscess; DVT US showing RLE dvt involving R femoral vein; BC 1/4 staph capitis; Urine culture showing E. Coli December 11, 2022 surgical drainage and Penrose drain placement 2022/12/11 wound culture >> budding yeast, rare GPC, rare GNR, rare GPR >> e. Coli pan sensitive 12-11-2022 linezolid changed to vancomycin 1/27 MRI brain > no acute intracranial abnormality.  Remote infarcts in the left corona radiata and inferior right cerebellum with expected evolution and remote blood products.  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 Unable to follow commands and is mostly nonverbal in bed. NAD.  Assessment and Plan:  Septic shock from perineal abscess, e. Coli UTI - shock now resolved - s/p I&D on 2022-12-11 -Wound culture noted as polymicrobial: E. coli, Candida albicans and glabrata, and Bacteroides -Transition antibiotics to micafungin, Ancef, Flagyl and monitor clinically   Atrial fibrillation with RVR -Continue metoprolol -Rate now controlled  Acute metabolic encephalopathy Hx CVA -Residual right-sided  deficits from stroke.  May also have residual baseline aphasia per family   AKI - improved with resuscitation  Hypocalcemia - s/p replacement    Thrombocytopenia Normocytic anemia R femoral vein DVT diagnosed 11/20/22 LLE DVT (posterior tibial, peroneal) - Underwent IVC placement on 11/26/2022 with IR - Would plan to retrieve IVC filter when platelets back to normal in 4-6 weeks and resume lifelong anticoagulation   History of CVA with residual right sided deficits PT -Continue Lipitor   Hyeprtension Hyperlipidemia Monitor hemodynamics  Restart amlodipine as BP returns to baseline Atorvastatin -Continue metoprolol   R gluteal pressure wound POA Wound care   DM2 with hyperglycemia SSI to continue   Depression lexapro   Malnutrition, severe with severe hypoalbuminemia Tube feeding to continue  Old records reviewed in assessment of this patient  Antimicrobials: Clindamycin 11/19/2022 >> 11/19/2022 Cefepime 11/27/2022 >> 11/25/2022 Rocephin 11/26/2022 x 1 Fluconazole 11/25/2022 >> 11/27/22 Zyvox 11/19/2022 >> 12/11/22 Vanc 1/21 x 1 and 1/27 x 1 Micafungin 11/24/22 x 1 Flagyl 11/20/2022 >> current Micafungin 11/27/22 >> current Ancef 11/27/2022 >> current  DVT prophylaxis:  SCDs Start: 11/19/22 0425   Code Status:   Code Status: Full Code  Mobility Assessment (last 72 hours)     Mobility Assessment     Row Name 11/27/22 1011 11/26/22 0800         What is the highest level of mobility based on the progressive mobility assessment? Level 1 (Bedfast) - Unable to balance while sitting on edge of bed Level 1 (Bedfast) - Unable to balance while sitting on edge of bed  Barriers to discharge:  Disposition Plan:   Status is: Inpt  Objective: Blood pressure 136/81, pulse (!) 110, temperature 98.5 F (36.9 C), temperature source Oral, resp. rate 17, height 5' (1.524 m), weight 117.4 kg, SpO2 96 %.  Examination:  Physical Exam Constitutional:       Comments: Awake and alert but not following commands  HENT:     Head: Normocephalic and atraumatic.     Mouth/Throat:     Mouth: Mucous membranes are moist.  Eyes:     Extraocular Movements: Extraocular movements intact.  Cardiovascular:     Rate and Rhythm: Normal rate.  Pulmonary:     Effort: Pulmonary effort is normal.     Breath sounds: Normal breath sounds.  Abdominal:     General: Bowel sounds are normal.     Palpations: Abdomen is soft.  Musculoskeletal:     Cervical back: Normal range of motion.     Comments: Right upper and lower extremity with 3+ edema  Skin:    General: Skin is warm.  Neurological:     Comments: Moves all 4 extremities less on the right side.  Unable to follow commands      Consultants:  General surgery  Procedures:  11/28/2022: EUA/I&D of left labial abscess  Data Reviewed: Results for orders placed or performed during the hospital encounter of 11/06/2022 (from the past 24 hour(s))  Glucose, capillary     Status: Abnormal   Collection Time: 11/26/22  3:53 PM  Result Value Ref Range   Glucose-Capillary 127 (H) 70 - 99 mg/dL  Glucose, capillary     Status: Abnormal   Collection Time: 11/26/22  8:21 PM  Result Value Ref Range   Glucose-Capillary 160 (H) 70 - 99 mg/dL  Glucose, capillary     Status: Abnormal   Collection Time: 11/27/22 12:01 AM  Result Value Ref Range   Glucose-Capillary 175 (H) 70 - 99 mg/dL  Glucose, capillary     Status: Abnormal   Collection Time: 11/27/22  3:11 AM  Result Value Ref Range   Glucose-Capillary 182 (H) 70 - 99 mg/dL  Basic metabolic panel     Status: Abnormal   Collection Time: 11/27/22  3:16 AM  Result Value Ref Range   Sodium 137 135 - 145 mmol/L   Potassium 4.0 3.5 - 5.1 mmol/L   Chloride 110 98 - 111 mmol/L   CO2 22 22 - 32 mmol/L   Glucose, Bld 192 (H) 70 - 99 mg/dL   BUN 52 (H) 8 - 23 mg/dL   Creatinine, Ser 1.93 (H) 0.44 - 1.00 mg/dL   Calcium 7.6 (L) 8.9 - 10.3 mg/dL   GFR, Estimated 28 (L)  >60 mL/min   Anion gap 5 5 - 15  CBC with Differential/Platelet     Status: Abnormal   Collection Time: 11/27/22  6:45 AM  Result Value Ref Range   WBC 15.2 (H) 4.0 - 10.5 K/uL   RBC 2.31 (L) 3.87 - 5.11 MIL/uL   Hemoglobin 7.7 (L) 12.0 - 15.0 g/dL   HCT 22.9 (L) 36.0 - 46.0 %   MCV 99.1 80.0 - 100.0 fL   MCH 33.3 26.0 - 34.0 pg   MCHC 33.6 30.0 - 36.0 g/dL   RDW 18.9 (H) 11.5 - 15.5 %   Platelets 46 (L) 150 - 400 K/uL   nRBC 0.6 (H) 0.0 - 0.2 %   Neutrophils Relative % 76 %   Neutro Abs 11.5 (H) 1.7 - 7.7 K/uL  Lymphocytes Relative 10 %   Lymphs Abs 1.6 0.7 - 4.0 K/uL   Monocytes Relative 12 %   Monocytes Absolute 1.9 (H) 0.1 - 1.0 K/uL   Eosinophils Relative 1 %   Eosinophils Absolute 0.1 0.0 - 0.5 K/uL   Basophils Relative 0 %   Basophils Absolute 0.0 0.0 - 0.1 K/uL   Immature Granulocytes 1 %   Abs Immature Granulocytes 0.16 (H) 0.00 - 0.07 K/uL  Glucose, capillary     Status: Abnormal   Collection Time: 11/27/22  8:07 AM  Result Value Ref Range   Glucose-Capillary 171 (H) 70 - 99 mg/dL  Glucose, capillary     Status: Abnormal   Collection Time: 11/27/22 11:07 AM  Result Value Ref Range   Glucose-Capillary 164 (H) 70 - 99 mg/dL  Glucose, capillary     Status: Abnormal   Collection Time: 11/27/22  3:06 PM  Result Value Ref Range   Glucose-Capillary 189 (H) 70 - 99 mg/dL    I have reviewed pertinent nursing notes, vitals, labs, and images as necessary. I have ordered labwork to follow up on as indicated.  I have reviewed the last notes from staff over past 24 hours. I have discussed patient's care plan and test results with nursing staff, CM/SW, and other staff as appropriate.  Time spent: Greater than 50% of the 55 minute visit was spent in counseling/coordination of care for the patient as laid out in the A&P.   LOS: 8 days   Dwyane Dee, MD [facility] 11/27/2022, 3:38 PM

## 2022-11-27 NOTE — Hospital Course (Addendum)
Ms. Leiner is a 67 yo female with PMH CVA with residual R side deficits, HTN, DMII, HLD, LLE DVT (12/2021) who presented originally on 11/21/2022 with N/V and AMS. Significant Hospital Events:  1/21 - Presented to [facility] for AMS x 2 days. Elevated WBC, soft BP, dirty UA with frank pus on I&O cath. CT Head NAICA, CT A/P as above with concern for perianal ST infection. CCS consulted. Lactate uptrending > 9.0. PCCM consulted for ICU admission. Urine culture MDR e. Coli. blood uclutre positive for staph capitis 1/23 OBGYN consulted for concern for possible labia abscess; DVT US showing RLE dvt involving R femoral vein; BC 1/4 staph capitis; Urine culture showing E. Coli 1/26 surgical drainage and Penrose drain placement 1/26 wound culture >> budding yeast, rare GPC, rare GNR, rare GPR >> e. Coli pan sensitive 1/26 linezolid changed to vancomycin 1/27 MRI brain > no acute intracranial abnormality.  Remote infarcts in the left corona radiata and inferior right cerebellum with expected evolution and remote blood products.  White matter changes extending along the corticospinal tracts to the medulla, new from prior exam consistent with wallerian degeneration 1/28 remains aphasic, some discrepancy in family regarding her code  2/3: Ongoing [place] discussions. Family has elected to transition to comfort due to decline

## 2022-11-28 DIAGNOSIS — L02215 Cutaneous abscess of perineum: Secondary | ICD-10-CM

## 2022-11-28 DIAGNOSIS — R799 Abnormal finding of blood chemistry, unspecified: Secondary | ICD-10-CM

## 2022-11-28 DIAGNOSIS — R6521 Severe sepsis with septic shock: Secondary | ICD-10-CM | POA: Diagnosis not present

## 2022-11-28 DIAGNOSIS — A419 Sepsis, unspecified organism: Secondary | ICD-10-CM | POA: Diagnosis not present

## 2022-11-28 DIAGNOSIS — N179 Acute kidney failure, unspecified: Secondary | ICD-10-CM | POA: Diagnosis not present

## 2022-11-28 LAB — CBC WITH DIFFERENTIAL/PLATELET
Abs Immature Granulocytes: 0.25 10*3/uL — ABNORMAL HIGH (ref 0.00–0.07)
Basophils Absolute: 0 10*3/uL (ref 0.0–0.1)
Basophils Relative: 0 %
Eosinophils Absolute: 0.1 10*3/uL (ref 0.0–0.5)
Eosinophils Relative: 0 %
HCT: 22.9 % — ABNORMAL LOW (ref 36.0–46.0)
Hemoglobin: 7.7 g/dL — ABNORMAL LOW (ref 12.0–15.0)
Immature Granulocytes: 2 %
Lymphocytes Relative: 10 %
Lymphs Abs: 1.5 10*3/uL (ref 0.7–4.0)
MCH: 33 pg (ref 26.0–34.0)
MCHC: 33.6 g/dL (ref 30.0–36.0)
MCV: 98.3 fL (ref 80.0–100.0)
Monocytes Absolute: 1.9 10*3/uL — ABNORMAL HIGH (ref 0.1–1.0)
Monocytes Relative: 13 %
Neutro Abs: 11.3 10*3/uL — ABNORMAL HIGH (ref 1.7–7.7)
Neutrophils Relative %: 75 %
Platelets: 47 10*3/uL — ABNORMAL LOW (ref 150–400)
RBC: 2.33 MIL/uL — ABNORMAL LOW (ref 3.87–5.11)
RDW: 18.4 % — ABNORMAL HIGH (ref 11.5–15.5)
WBC: 15 10*3/uL — ABNORMAL HIGH (ref 4.0–10.5)
nRBC: 1 % — ABNORMAL HIGH (ref 0.0–0.2)

## 2022-11-28 LAB — MAGNESIUM: Magnesium: 1.8 mg/dL (ref 1.7–2.4)

## 2022-11-28 LAB — BASIC METABOLIC PANEL
Anion gap: 9 (ref 5–15)
BUN: 65 mg/dL — ABNORMAL HIGH (ref 8–23)
CO2: 20 mmol/L — ABNORMAL LOW (ref 22–32)
Calcium: 7.8 mg/dL — ABNORMAL LOW (ref 8.9–10.3)
Chloride: 107 mmol/L (ref 98–111)
Creatinine, Ser: 1.96 mg/dL — ABNORMAL HIGH (ref 0.44–1.00)
GFR, Estimated: 28 mL/min — ABNORMAL LOW (ref >60)
Glucose, Bld: 260 mg/dL — ABNORMAL HIGH (ref 70–99)
Potassium: 4 mmol/L (ref 3.5–5.1)
Sodium: 136 mmol/L (ref 135–145)

## 2022-11-28 LAB — GLUCOSE, CAPILLARY
Glucose-Capillary: 229 mg/dL — ABNORMAL HIGH (ref 70–99)
Glucose-Capillary: 252 mg/dL — ABNORMAL HIGH (ref 70–99)
Glucose-Capillary: 248 mg/dL — ABNORMAL HIGH (ref 70–99)
Glucose-Capillary: 210 mg/dL — ABNORMAL HIGH (ref 70–99)
Glucose-Capillary: 226 mg/dL — ABNORMAL HIGH (ref 70–99)

## 2022-11-28 MED ORDER — METOCLOPRAMIDE HCL 10 MG PO TABS
5.0000 mg | ORAL_TABLET | Freq: Three times a day (TID) | ORAL | Status: DC | PRN
  Administered 2022-11-30: 5 mg
  Filled 2022-11-28: qty 1

## 2022-11-28 MED ORDER — INSULIN GLARGINE-YFGN 100 UNIT/ML ~~LOC~~ SOLN
10.0000 [IU] | Freq: Every day | SUBCUTANEOUS | Status: DC
  Administered 2022-11-28: 10 [IU] via SUBCUTANEOUS
  Filled 2022-11-28 (×2): qty 0.1

## 2022-11-28 MED ORDER — METRONIDAZOLE 500 MG PO TABS
500.0000 mg | ORAL_TABLET | Freq: Two times a day (BID) | ORAL | Status: DC
  Administered 2022-11-28 – 2022-12-01 (×6): 500 mg
  Filled 2022-11-28 (×6): qty 1

## 2022-11-28 NOTE — Progress Notes (Signed)
Occupational Therapy Treatment Patient Details Name: Samantha [name] MRN: [id number] DOB: 1956-02-23 Today's Date: 11/28/2022   History of present illness 67 y.o. F admitted 11/19/2022 with sepsis due to UTI and extensive subcutaneous gas/debris within L perineal soft tissue, tracking along posterior Lt labia majora/perianal tissue. 2022/12/20 I&D of left labia. 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   OT comments  Patient received in supine and demonstrating left lateral gaze. Patient required assistance to get head in midline and was able to maintain during session. Patient tolerated PROM to RUE with patient demonstrating signs of pain for finger extension. Patient was able to participate with grooming but required assistance to complete. Patient continues to require assistance for self care and mobility with will continue to be followed by acute OT.    Recommendations for follow up therapy are one component of a multi-disciplinary discharge planning process, led by the attending physician.  Recommendations may be updated based on patient status, additional functional criteria and insurance authorization.    Follow Up Recommendations  Long-term institutional care without follow-up therapy     Assistance Recommended at Discharge Frequent or constant Supervision/Assistance  Patient can return home with the following  A lot of help with walking and/or transfers;A lot of help with bathing/dressing/bathroom;Direct supervision/assist for medications management;Direct supervision/assist for financial management;Help with stairs or ramp for entrance;Assist for transportation   Equipment Recommendations  Other (comment) (defer)    Recommendations for Other Services      Precautions / Restrictions Precautions Precautions: Fall;Other (comment) Precaution Comments: cortrak, rt hemiparesis, multiple sacral/labial wounds Restrictions Weight  Bearing Restrictions: No       Mobility Bed Mobility Overal bed mobility: Needs Assistance                  Transfers                   General transfer comment: unable     Balance                                           ADL either performed or assessed with clinical judgement   ADL Overall ADL's : Needs assistance/impaired     Grooming: Wash/dry face;Moderate assistance;Bed level Grooming Details (indicate cue type and reason): able to wash left side of face and required assistance to wash right side                               General ADL Comments: focused on RUE ROM and grooming    Extremity/Trunk Assessment Upper Extremity Assessment RUE Sensation: decreased light touch;decreased proprioception RUE Coordination: decreased fine motor;decreased gross motor LUE Coordination: decreased fine motor;decreased gross motor            Vision   Additional Comments: demonstrated left gaze   Perception     Praxis      Cognition Arousal/Alertness: Awake/alert Behavior During Therapy: Flat affect Overall Cognitive Status: History of cognitive impairments - at baseline                                 General Comments: non verbal on this date, followed commands with increased time        Exercises Exercises: General  Upper Extremity General Exercises - Upper Extremity Shoulder Flexion: AAROM, Left, Supine, 10 reps, PROM, Right Shoulder ABduction: PROM, Right, 10 reps, Supine Elbow Flexion: AAROM, Left, 10 reps, PROM, Right, Supine Elbow Extension: PROM, Right, 10 reps, Supine Wrist Flexion: PROM, Right, 10 reps, Supine Composite Extension: PROM, Right, 10 reps, Supine    Shoulder Instructions       General Comments      Pertinent Vitals/ Pain       Pain Assessment Pain Assessment: Faces Faces Pain Scale: Hurts a little bit Pain Location: R side, generalized with movement Pain Descriptors /  Indicators: Grimacing Pain Intervention(s): Monitored during session, Repositioned  Home Living                                          Prior Functioning/Environment              Frequency  Min 2X/week        Progress Toward Goals  OT Goals(current goals can now be found in the care plan section)  Progress towards OT goals: Progressing toward goals  Acute Rehab OT Goals OT Goal Formulation: Patient unable to participate in goal setting Time For Goal Achievement: 12/04/22 Potential to Achieve Goals: Fair ADL Goals Pt Will Perform Grooming: with mod assist;sitting Pt Will Perform Upper Body Dressing: with mod assist;sitting Pt Will Transfer to Toilet: with max assist;stand pivot transfer;bedside commode Additional ADL Goal #1: pt will complete bed mobility with mod A as a precursor to ADLs  Plan Discharge plan remains appropriate    Co-evaluation                 AM-PAC OT "6 Clicks" Daily Activity     Outcome Measure   Help from another person eating meals?: Total Help from another person taking care of personal grooming?: A Lot Help from another person toileting, which includes using toliet, bedpan, or urinal?: Total Help from another person bathing (including washing, rinsing, drying)?: Total Help from another person to put on and taking off regular upper body clothing?: Total Help from another person to put on and taking off regular lower body clothing?: Total 6 Click Score: 7    End of Session    OT Visit Diagnosis: Unsteadiness on feet (R26.81);Other abnormalities of gait and mobility (R26.89);History of falling (Z91.81);Muscle weakness (generalized) (M62.81);Ataxia, unspecified (R27.0);Hemiplegia and hemiparesis Hemiplegia - Right/Left: Right Hemiplegia - dominant/non-dominant: Dominant Hemiplegia - caused by: Cerebral infarction   Activity Tolerance Patient tolerated treatment well   Patient Left in bed;with call bell/phone  within reach   Nurse Communication Mobility status        Time: 7169-6789 OT Time Calculation (min): 18 min  Charges: OT General Charges $OT Visit: 1 Visit OT Treatments $Neuromuscular Re-education: 8-22 mins  Lodema Hong, [place]  Office [place] 11/28/2022, 9:49 AM

## 2022-11-28 NOTE — Progress Notes (Signed)
Progress Note    Samantha [name]   YYT:[id number]  DOB: September 20, 1956  DOA: 11/07/2022     9 PCP: [name], PA-C  Initial CC: AMS  Hospital Course: Samantha [name] is a 67 yo female with PMH CVA with residual R side deficits, HTN, DMII, HLD, LLE DVT (12/2021) who presented originally on 11/15/2022 with N/V and AMS. Significant Hospital Events:  1/21 - Presented to [facility] for AMS x 2 days. Elevated WBC, soft BP, dirty UA with frank pus on I&O cath. CT Head NAICA, CT A/P as above with concern for perianal ST infection. CCS consulted. Lactate uptrending > 9.0. PCCM consulted for ICU admission. Urine culture MDR e. Coli. blood uclutre positive for staph capitis 1/23 OBGYN consulted for concern for possible labia abscess; DVT US showing RLE dvt involving R femoral vein; BC 1/4 staph capitis; Urine culture showing E. Coli 12/05/22 surgical drainage and Penrose drain placement 05-Dec-2022 wound culture >> budding yeast, rare GPC, rare GNR, rare GPR >> e. Coli pan sensitive 12/05/22 linezolid changed to vancomycin 1/27 MRI brain > no acute intracranial abnormality.  Remote infarcts in the left corona radiata and inferior right cerebellum with expected evolution and remote blood products.  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 Unable to follow commands and is mostly nonverbal in bed. NAD.  Assessment and Plan:  Septic shock from perineal abscess, e. Coli UTI - shock now resolved - s/p I&D on Dec 05, 2022 -Wound culture noted as polymicrobial: E. coli, Candida albicans/glabrata, and Bacteroides -Transition antibiotics to micafungin, Ancef, Flagyl and monitor clinically   Atrial fibrillation with RVR -Continue metoprolol -Rate now controlled  Acute metabolic encephalopathy Hx CVA -Residual right-sided deficits  from stroke.  May also have residual baseline aphasia per family - still has cortrak and not passing swallow eval's.  Palliative care consulted to help initiate [place] discussions with family regarding next steps as her recovery appears very slow versus new baseline   AKI on CKD3b - baseline creat ~1.4 - creat currently 1.9 - continue TF and FW  Hypocalcemia - s/p replacement    Thrombocytopenia Normocytic anemia R femoral vein DVT diagnosed 11/20/22 LLE DVT (posterior tibial, peroneal) 12/2021 - Underwent IVC placement on 11/26/2022 with IR - RUE duplex negative for DVT from 11/27/22 - Would plan to retrieve IVC filter when platelets back to normal in 4-6 weeks and resume lifelong anticoagulation   History of CVA with residual right sided deficits PT -Continue Lipitor   Hyeprtension Hyperlipidemia Monitor hemodynamics  Restart amlodipine as BP returns to baseline Atorvastatin -Continue metoprolol   R gluteal pressure wound POA Wound care   DM2 with hyperglycemia SSI to continue   Depression lexapro   Malnutrition, severe with severe hypoalbuminemia Tube feeding to continue  Old records reviewed in assessment of this patient  Antimicrobials: Clindamycin 11/19/2022 >> 11/19/2022 Cefepime 11/26/2022 >> 11/25/2022 Rocephin 11/26/2022 x 1 Fluconazole 11/25/2022 >> 11/27/22 Zyvox 11/19/2022 >> 05-Dec-2022 Vanc 1/21 x 1 and 1/27 x 1 Micafungin 11/24/22 x 1 Flagyl 11/20/2022 >> current Micafungin 11/27/22 >> current Ancef 11/27/2022 >> current  DVT prophylaxis:  SCDs Start: 11/19/22 0425   Code Status:   Code Status: Full Code  Mobility Assessment (last 72 hours)     Mobility Assessment     Row Name 11/28/22 0935 11/27/22 1011 11/26/22 0800  What is the highest level of mobility based on the progressive mobility assessment? Level 1 (Bedfast) - Unable to balance while sitting on edge of bed Level 1 (Bedfast) - Unable to balance while sitting on edge of bed Level 1  (Bedfast) - Unable to balance while sitting on edge of bed              Barriers to discharge:  Disposition Plan:   Status is: Inpt  Objective: Blood pressure (!) 140/87, pulse 93, temperature 98.6 F (37 C), temperature source Oral, resp. rate 18, height 5' (1.524 m), weight 115.9 kg, SpO2 95 %.  Examination:  Physical Exam Constitutional:      Comments: Awake and alert but not following commands  HENT:     Head: Normocephalic and atraumatic.     Mouth/Throat:     Mouth: Mucous membranes are moist.  Eyes:     Extraocular Movements: Extraocular movements intact.  Cardiovascular:     Rate and Rhythm: Normal rate.  Pulmonary:     Effort: Pulmonary effort is normal.     Breath sounds: Normal breath sounds.  Abdominal:     General: Bowel sounds are normal.     Palpations: Abdomen is soft.  Musculoskeletal:     Cervical back: Normal range of motion.     Comments: Right upper and lower extremity with 3+ edema  Skin:    General: Skin is warm.  Neurological:     Comments: Moves all 4 extremities less on the right side.  Unable to follow commands      Consultants:  General surgery  Procedures:  11/03/2022: EUA/I&D of left labial abscess  Data Reviewed: Results for orders placed or performed during the hospital encounter of 11/02/2022 (from the past 24 hour(s))  Glucose, capillary     Status: Abnormal   Collection Time: 11/27/22  3:06 PM  Result Value Ref Range   Glucose-Capillary 189 (H) 70 - 99 mg/dL  Glucose, capillary     Status: Abnormal   Collection Time: 11/27/22  7:43 PM  Result Value Ref Range   Glucose-Capillary 190 (H) 70 - 99 mg/dL  Glucose, capillary     Status: Abnormal   Collection Time: 11/27/22 11:28 PM  Result Value Ref Range   Glucose-Capillary 249 (H) 70 - 99 mg/dL  Basic metabolic panel     Status: Abnormal   Collection Time: 11/28/22  3:18 AM  Result Value Ref Range   Sodium 136 135 - 145 mmol/L   Potassium 4.0 3.5 - 5.1 mmol/L   Chloride  107 98 - 111 mmol/L   CO2 20 (L) 22 - 32 mmol/L   Glucose, Bld 260 (H) 70 - 99 mg/dL   BUN 65 (H) 8 - 23 mg/dL   Creatinine, Ser 1.96 (H) 0.44 - 1.00 mg/dL   Calcium 7.8 (L) 8.9 - 10.3 mg/dL   GFR, Estimated 28 (L) >60 mL/min   Anion gap 9 5 - 15  CBC with Differential/Platelet     Status: Abnormal   Collection Time: 11/28/22  3:18 AM  Result Value Ref Range   WBC 15.0 (H) 4.0 - 10.5 K/uL   RBC 2.33 (L) 3.87 - 5.11 MIL/uL   Hemoglobin 7.7 (L) 12.0 - 15.0 g/dL   HCT 22.9 (L) 36.0 - 46.0 %   MCV 98.3 80.0 - 100.0 fL   MCH 33.0 26.0 - 34.0 pg   MCHC 33.6 30.0 - 36.0 g/dL   RDW 18.4 (H) 11.5 - 15.5 %  Platelets 47 (L) 150 - 400 K/uL   nRBC 1.0 (H) 0.0 - 0.2 %   Neutrophils Relative % 75 %   Neutro Abs 11.3 (H) 1.7 - 7.7 K/uL   Lymphocytes Relative 10 %   Lymphs Abs 1.5 0.7 - 4.0 K/uL   Monocytes Relative 13 %   Monocytes Absolute 1.9 (H) 0.1 - 1.0 K/uL   Eosinophils Relative 0 %   Eosinophils Absolute 0.1 0.0 - 0.5 K/uL   Basophils Relative 0 %   Basophils Absolute 0.0 0.0 - 0.1 K/uL   Immature Granulocytes 2 %   Abs Immature Granulocytes 0.25 (H) 0.00 - 0.07 K/uL  Magnesium     Status: None   Collection Time: 11/28/22  3:18 AM  Result Value Ref Range   Magnesium 1.8 1.7 - 2.4 mg/dL  Glucose, capillary     Status: Abnormal   Collection Time: 11/28/22  3:18 AM  Result Value Ref Range   Glucose-Capillary 229 (H) 70 - 99 mg/dL  Glucose, capillary     Status: Abnormal   Collection Time: 11/28/22  8:07 AM  Result Value Ref Range   Glucose-Capillary 252 (H) 70 - 99 mg/dL  Glucose, capillary     Status: Abnormal   Collection Time: 11/28/22 11:12 AM  Result Value Ref Range   Glucose-Capillary 248 (H) 70 - 99 mg/dL    I have reviewed pertinent nursing notes, vitals, labs, and images as necessary. I have ordered labwork to follow up on as indicated.  I have reviewed the last notes from staff over past 24 hours. I have discussed patient's care plan and test results with nursing  staff, CM/SW, and other staff as appropriate.  Time spent: Greater than 50% of the 55 minute visit was spent in counseling/coordination of care for the patient as laid out in the A&P.   LOS: 9 days   Dwyane Dee, MD [facility] 11/28/2022, 2:55 PM

## 2022-11-28 NOTE — Inpatient Diabetes Management (Signed)
Inpatient Diabetes Program Recommendations  AACE/ADA: New Consensus Statement on Inpatient Glycemic Control (2015)  Target Ranges:  Prepandial:   less than 140 mg/dL      Peak postprandial:   less than 180 mg/dL (1-2 hours)      Critically ill patients:  140 - 180 mg/dL   Lab Results  Component Value Date   GLUCAP 252 (H) 11/28/2022   HGBA1C 6.1 (H) 08/13/2022    Review of Glycemic Control  Latest Reference Range & Units 11/27/22 23:28 11/28/22 03:18 11/28/22 08:07  Glucose-Capillary 70 - 99 mg/dL 249 (H) 229 (H) 252 (H)  (H): Data is abnormally high Diabetes history: Type 2 DM Outpatient Diabetes medications: Basaglar 16 units QD Current orders for Inpatient glycemic control: Novolog 0-15 units Q4H Vital @ 55 ml/hr  Inpatient Diabetes Program Recommendations:    Consider adding Novolog 3 units Q4H for tube feed coverage (to be stopped or held in the event tube feeds stopped).  Thanks, Bronson Curb, MSN, RNC-OB Diabetes Coordinator [phone] (8a-5p)

## 2022-11-28 NOTE — Progress Notes (Signed)
Speech Language Pathology Treatment: Dysphagia  Patient Details Name: Samantha [name] MRN: [id number] DOB: 1956-01-07 Today's Date: 11/28/2022 Time: 0945-1000 SLP Time Calculation (min) (ACUTE ONLY): 15 min  Assessment / Plan / Recommendation Clinical Impression  Pt with very flat affect. Will not follow commands with RUE but did reach for a towel and wiped her mouth once. Pt generally inattentive, orally held an ice chip and did not swallow. However, adding self feeding and automaticity improved attention. Pt initiated sips from straw with immediate swallows of water and juice x5. If given one sip and then another one immediately, pt orally held the second briefly and needed verbal cues to swallow. Had one cough while oral holding. Pt needs more opportunities to interact and swallow. Asked RN to given sips of water, juice or soda today with hand over hand assist to see it pt starts engaging more. Officially ordering a full liquid diet, though there is no expectation that pt will eat a meal or have meaningful intake. Diet is therapeutic at this time.    HPI HPI: 67 year old woman who presented to [facility] ED 1/21 via EMS for AMS.  Pt found to be septic due to soft tissue infection around perineum. Head CT 1/21 and CXR 1/23 with no acute findings. PMHx significant for HTN, HLD, T2DM, CVA with R-sided deficits, LLE DVT (12/2021, previously on Eliquis), depression. No hx dysphagia from prior CVA.      SLP Plan  Continue with current plan of care      Recommendations for follow up therapy are one component of a multi-disciplinary discharge planning process, led by the attending physician.  Recommendations may be updated based on patient status, additional functional criteria and insurance authorization.    Recommendations  Diet recommendations: Thin liquid Liquids provided via: Straw Medication Administration: Via alternative means Supervision: Full supervision/cueing for compensatory  strategies Compensations: Slow rate;Small sips/bites Postural Changes and/or Swallow Maneuvers: Seated upright 90 degrees                Oral Care Recommendations: Oral care BID Follow Up Recommendations: Skilled nursing-short term rehab (<3 hours/day) Plan: Continue with current plan of care           [name], [name]  11/28/2022, 10:34 AM

## 2022-11-29 DIAGNOSIS — N179 Acute kidney failure, unspecified: Secondary | ICD-10-CM | POA: Diagnosis not present

## 2022-11-29 DIAGNOSIS — A419 Sepsis, unspecified organism: Secondary | ICD-10-CM | POA: Diagnosis not present

## 2022-11-29 DIAGNOSIS — R6521 Severe sepsis with septic shock: Secondary | ICD-10-CM | POA: Diagnosis not present

## 2022-11-29 DIAGNOSIS — Z515 Encounter for palliative care: Secondary | ICD-10-CM

## 2022-11-29 DIAGNOSIS — L02215 Cutaneous abscess of perineum: Secondary | ICD-10-CM | POA: Diagnosis not present

## 2022-11-29 LAB — CBC WITH DIFFERENTIAL/PLATELET
Abs Immature Granulocytes: 0.62 K/uL — ABNORMAL HIGH (ref 0.00–0.07)
Basophils Absolute: 0 K/uL (ref 0.0–0.1)
Basophils Relative: 0 %
Eosinophils Absolute: 0.1 K/uL (ref 0.0–0.5)
Eosinophils Relative: 0 %
HCT: 21.2 % — ABNORMAL LOW (ref 36.0–46.0)
Hemoglobin: 7.1 g/dL — ABNORMAL LOW (ref 12.0–15.0)
Immature Granulocytes: 4 %
Lymphocytes Relative: 12 %
Lymphs Abs: 1.8 K/uL (ref 0.7–4.0)
MCH: 33.2 pg (ref 26.0–34.0)
MCHC: 33.5 g/dL (ref 30.0–36.0)
MCV: 99.1 fL (ref 80.0–100.0)
Monocytes Absolute: 1.7 K/uL — ABNORMAL HIGH (ref 0.1–1.0)
Monocytes Relative: 11 %
Neutro Abs: 10.8 K/uL — ABNORMAL HIGH (ref 1.7–7.7)
Neutrophils Relative %: 73 %
Platelets: 52 K/uL — ABNORMAL LOW (ref 150–400)
RBC: 2.14 MIL/uL — ABNORMAL LOW (ref 3.87–5.11)
RDW: 18.6 % — ABNORMAL HIGH (ref 11.5–15.5)
WBC: 14.9 K/uL — ABNORMAL HIGH (ref 4.0–10.5)
nRBC: 1.7 % — ABNORMAL HIGH (ref 0.0–0.2)

## 2022-11-29 LAB — BASIC METABOLIC PANEL WITH GFR
Anion gap: 7 (ref 5–15)
BUN: 77 mg/dL — ABNORMAL HIGH (ref 8–23)
CO2: 21 mmol/L — ABNORMAL LOW (ref 22–32)
Calcium: 7.5 mg/dL — ABNORMAL LOW (ref 8.9–10.3)
Chloride: 107 mmol/L (ref 98–111)
Creatinine, Ser: 2.02 mg/dL — ABNORMAL HIGH (ref 0.44–1.00)
GFR, Estimated: 27 mL/min — ABNORMAL LOW
Glucose, Bld: 245 mg/dL — ABNORMAL HIGH (ref 70–99)
Potassium: 3.7 mmol/L (ref 3.5–5.1)
Sodium: 135 mmol/L (ref 135–145)

## 2022-11-29 LAB — MAGNESIUM: Magnesium: 1.7 mg/dL (ref 1.7–2.4)

## 2022-11-29 LAB — HEMOGLOBIN AND HEMATOCRIT, BLOOD
HCT: 26.2 % — ABNORMAL LOW (ref 36.0–46.0)
Hemoglobin: 8.8 g/dL — ABNORMAL LOW (ref 12.0–15.0)

## 2022-11-29 LAB — GLUCOSE, CAPILLARY
Glucose-Capillary: 229 mg/dL — ABNORMAL HIGH (ref 70–99)
Glucose-Capillary: 241 mg/dL — ABNORMAL HIGH (ref 70–99)
Glucose-Capillary: 242 mg/dL — ABNORMAL HIGH (ref 70–99)
Glucose-Capillary: 253 mg/dL — ABNORMAL HIGH (ref 70–99)
Glucose-Capillary: 276 mg/dL — ABNORMAL HIGH (ref 70–99)
Glucose-Capillary: 316 mg/dL — ABNORMAL HIGH (ref 70–99)
Glucose-Capillary: 334 mg/dL — ABNORMAL HIGH (ref 70–99)

## 2022-11-29 LAB — PREPARE RBC (CROSSMATCH)

## 2022-11-29 MED ORDER — SODIUM CHLORIDE 0.9% IV SOLUTION: Freq: Once | INTRAVENOUS | Status: AC

## 2022-11-29 MED ORDER — SODIUM CHLORIDE 0.9 % IV SOLN: INTRAVENOUS | Status: AC

## 2022-11-29 MED ORDER — INSULIN GLARGINE-YFGN 100 UNIT/ML ~~LOC~~ SOLN
12.0000 [IU] | Freq: Every day | SUBCUTANEOUS | Status: DC
  Administered 2022-11-29: 12 [IU] via SUBCUTANEOUS
  Filled 2022-11-29 (×4): qty 0.12

## 2022-11-29 NOTE — Progress Notes (Signed)
   Palliative Medicine Inpatient Follow Up Note  The Palliative medicine team has acknowledged the consultation for Samantha [name].   I have reviewed Samantha [name]'s chart and called her spouse to coordinate a meeting.  Patients spouse, Samantha [name] will be in the hospital tomorrow morning and has agreed to meet though is unable to commit to a time.   Plan for formal consultation note to be placed after meeting with patients spouse.  No Charge ______________________________________________________________________________________ [place] Team Team Cell Phone: [phone] Please utilize secure chat with additional questions, if there is no response within 30 minutes please call the above phone number  Palliative Medicine Team providers are available by phone from 7am to 7pm daily and can be reached through the team cell phone.  Should this patient require assistance outside of these hours, please call the patient's attending physician.

## 2022-11-29 NOTE — Progress Notes (Signed)
Progress Note    Samantha [name]   [id number]:[id number]  DOB: December 01, 1955  DOA: 11/02/2022     10 PCP: [name], PA-C  Initial CC: AMS  Hospital Course: Samantha [name] is a 67 yo female with PMH CVA with residual R side deficits, HTN, DMII, HLD, LLE DVT (12/2021) who presented originally on 11/17/2022 with N/V and AMS. Significant Hospital Events:  1/21 - Presented to [facility] for AMS x 2 days. Elevated WBC, soft BP, dirty UA with frank pus on I&O cath. CT Head NAICA, CT A/P as above with concern for perianal ST infection. CCS consulted. Lactate uptrending > 9.0. PCCM consulted for ICU admission. Urine culture MDR e. Coli. blood uclutre positive for staph capitis 1/23 OBGYN consulted for concern for possible labia abscess; DVT US showing RLE dvt involving R femoral vein; BC 1/4 staph capitis; Urine culture showing E. Coli 1/26 surgical drainage and Penrose drain placement 1/26 wound culture >> budding yeast, rare GPC, rare GNR, rare GPR >> e. Coli pan sensitive 1/26 linezolid changed to vancomycin 1/27 MRI brain > no acute intracranial abnormality.  Remote infarcts in the left corona radiata and inferior right cerebellum with expected evolution and remote blood products.  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  Still unable to follow any commands when seen this morning. Palliative care now involved and planning for hopeful family meeting tomorrow with her husband.  Assessment and Plan:  Septic shock from perineal abscess, e. Coli UTI - shock now resolved - s/p I&D on 11/08/2022 -Wound culture noted as polymicrobial: E. coli, Candida albicans/glabrata, and Bacteroides -Transition antibiotics to micafungin, Ancef, Flagyl and monitor clinically   Atrial fibrillation with RVR -Continue  metoprolol -Rate now controlled  Acute metabolic encephalopathy Hx CVA -Residual right-sided deficits from stroke.  May also have residual baseline aphasia per family - still has cortrak but trial of CLD though not taking any in still - Palliative care consulted to help initiate [place] discussions with family regarding next steps as her recovery appears very slow versus new baseline   AKI on CKD3b - baseline creat ~1.4 - creat currently 1.9 - continue TF and FW -Still some worsening in creatinine today, start normal saline for 1 day and repeat BMP tomorrow  Hypocalcemia - s/p replacement    Thrombocytopenia Normocytic anemia R femoral vein DVT diagnosed 11/20/22 LLE DVT (posterior tibial, peroneal) 12/2021 - Underwent IVC placement on 11/26/2022 with IR - RUE duplex negative for DVT from 11/27/22 - Would plan to retrieve IVC filter when platelets back to normal in 4-6 weeks and resume lifelong anticoagulation -Hgb down to 7.1 g/dL on 11/29/2022, will go ahead and transfuse 1 unit PRBC   History of CVA with residual right sided deficits PT -Continue Lipitor   Hyeprtension Hyperlipidemia Monitor hemodynamics  Restart amlodipine as BP returns to baseline Atorvastatin -Continue metoprolol   R gluteal pressure wound POA Wound care   DM2 with hyperglycemia SSI to continue   Depression lexapro   Malnutrition, severe with severe hypoalbuminemia Tube feeding to continue  Old records reviewed in assessment of this patient  Antimicrobials: Clindamycin 11/19/2022 >> 11/19/2022 Cefepime 11/16/2022 >> 11/25/2022 Rocephin 11/26/2022 x 1 Fluconazole 11/25/2022 >> 11/27/22 Zyvox 11/19/2022 >> 10/29/2022 Vanc 1/21 x 1 and 1/27 x 1 Micafungin 11/24/22 x 1 Flagyl 11/20/2022 >> current Micafungin 11/27/22 >> current  Ancef 11/27/2022 >> current  DVT prophylaxis:  SCDs Start: 11/19/22 0425   Code Status:   Code Status: Full Code  Mobility Assessment (last 72 hours)     Mobility Assessment      Row Name 11/29/22 1300 11/28/22 0935 11/27/22 1011       Does patient have an order for bedrest or is patient medically unstable No - Continue assessment -- --     What is the highest level of mobility based on the progressive mobility assessment? Level 1 (Bedfast) - Unable to balance while sitting on edge of bed Level 1 (Bedfast) - Unable to balance while sitting on edge of bed Level 1 (Bedfast) - Unable to balance while sitting on edge of bed              Barriers to discharge:  Disposition Plan:   Status is: Inpt  Objective: Blood pressure 128/71, pulse 98, temperature 98.9 F (37.2 C), temperature source Oral, resp. rate 18, height 5' (1.524 m), weight 118.1 kg, SpO2 92 %.  Examination:  Physical Exam Constitutional:      Comments: Awake and alert but not following commands  HENT:     Head: Normocephalic and atraumatic.     Mouth/Throat:     Mouth: Mucous membranes are moist.  Eyes:     Extraocular Movements: Extraocular movements intact.  Cardiovascular:     Rate and Rhythm: Normal rate.  Pulmonary:     Effort: Pulmonary effort is normal.     Breath sounds: Normal breath sounds.  Abdominal:     General: Bowel sounds are normal.     Palpations: Abdomen is soft.  Musculoskeletal:     Cervical back: Normal range of motion.     Comments: Right upper and lower extremity with 3+ edema  Skin:    General: Skin is warm.  Neurological:     Comments: Moves all 4 extremities less on the right side.  Unable to follow commands      Consultants:  General surgery  Procedures:  11/22/2022: EUA/I&D of left labial abscess  Data Reviewed: Results for orders placed or performed during the hospital encounter of 11/24/2022 (from the past 24 hour(s))  Glucose, capillary     Status: Abnormal   Collection Time: 11/28/22  5:46 PM  Result Value Ref Range   Glucose-Capillary 210 (H) 70 - 99 mg/dL  Glucose, capillary     Status: Abnormal   Collection Time: 11/28/22  7:47 PM   Result Value Ref Range   Glucose-Capillary 226 (H) 70 - 99 mg/dL  Glucose, capillary     Status: Abnormal   Collection Time: 11/29/22 12:21 AM  Result Value Ref Range   Glucose-Capillary 276 (H) 70 - 99 mg/dL  Basic metabolic panel     Status: Abnormal   Collection Time: 11/29/22  2:25 AM  Result Value Ref Range   Sodium 135 135 - 145 mmol/L   Potassium 3.7 3.5 - 5.1 mmol/L   Chloride 107 98 - 111 mmol/L   CO2 21 (L) 22 - 32 mmol/L   Glucose, Bld 245 (H) 70 - 99 mg/dL   BUN 77 (H) 8 - 23 mg/dL   Creatinine, Ser 2.02 (H) 0.44 - 1.00 mg/dL   Calcium 7.5 (L) 8.9 - 10.3 mg/dL   GFR, Estimated 27 (L) >60 mL/min   Anion gap 7 5 - 15  CBC with Differential/Platelet     Status: Abnormal   Collection Time: 11/29/22  2:25 AM  Result Value  Ref Range   WBC 14.9 (H) 4.0 - 10.5 K/uL   RBC 2.14 (L) 3.87 - 5.11 MIL/uL   Hemoglobin 7.1 (L) 12.0 - 15.0 g/dL   HCT 21.2 (L) 36.0 - 46.0 %   MCV 99.1 80.0 - 100.0 fL   MCH 33.2 26.0 - 34.0 pg   MCHC 33.5 30.0 - 36.0 g/dL   RDW 18.6 (H) 11.5 - 15.5 %   Platelets 52 (L) 150 - 400 K/uL   nRBC 1.7 (H) 0.0 - 0.2 %   Neutrophils Relative % 73 %   Neutro Abs 10.8 (H) 1.7 - 7.7 K/uL   Lymphocytes Relative 12 %   Lymphs Abs 1.8 0.7 - 4.0 K/uL   Monocytes Relative 11 %   Monocytes Absolute 1.7 (H) 0.1 - 1.0 K/uL   Eosinophils Relative 0 %   Eosinophils Absolute 0.1 0.0 - 0.5 K/uL   Basophils Relative 0 %   Basophils Absolute 0.0 0.0 - 0.1 K/uL   Immature Granulocytes 4 %   Abs Immature Granulocytes 0.62 (H) 0.00 - 0.07 K/uL  Magnesium     Status: None   Collection Time: 11/29/22  2:25 AM  Result Value Ref Range   Magnesium 1.7 1.7 - 2.4 mg/dL  Glucose, capillary     Status: Abnormal   Collection Time: 11/29/22  3:52 AM  Result Value Ref Range   Glucose-Capillary 241 (H) 70 - 99 mg/dL  Glucose, capillary     Status: Abnormal   Collection Time: 11/29/22  8:15 AM  Result Value Ref Range   Glucose-Capillary 229 (H) 70 - 99 mg/dL  Type and screen  [place]     Status: None (Preliminary result)   Collection Time: 11/29/22  9:00 AM  Result Value Ref Range   ABO/RH(D) A POS    Antibody Screen NEG    Sample Expiration 12/08/2022,2359    Unit Number [id number]    Blood Component Type RED CELLS,LR    Unit division 00    Status of Unit ISSUED    Transfusion Status OK TO TRANSFUSE    Crossmatch Result      Compatible Performed at [facility], [address] [place], [place] [address]   Prepare RBC (crossmatch)     Status: None   Collection Time: 11/29/22  9:00 AM  Result Value Ref Range   Order Confirmation      ORDER PROCESSED BY BLOOD BANK Performed at [facility], [place] [address], [place], [place] [address]   Glucose, capillary     Status: Abnormal   Collection Time: 11/29/22 11:15 AM  Result Value Ref Range   Glucose-Capillary 242 (H) 70 - 99 mg/dL  Glucose, capillary     Status: Abnormal   Collection Time: 11/29/22  3:47 PM  Result Value Ref Range   Glucose-Capillary 253 (H) 70 - 99 mg/dL    I have reviewed pertinent nursing notes, vitals, labs, and images as necessary. I have ordered labwork to follow up on as indicated.  I have reviewed the last notes from staff over past 24 hours. I have discussed patient's care plan and test results with nursing staff, CM/SW, and other staff as appropriate.  Time spent: Greater than 50% of the 55 minute visit was spent in counseling/coordination of care for the patient as laid out in the A&P.   LOS: 10 days   Dwyane Dee, MD [facility] 11/29/2022, 5:08 PM

## 2022-11-29 NOTE — Inpatient Diabetes Management (Signed)
Inpatient Diabetes Program Recommendations  AACE/ADA: New Consensus Statement on Inpatient Glycemic Control (2015)  Target Ranges:  Prepandial:   less than 140 mg/dL      Peak postprandial:   less than 180 mg/dL (1-2 hours)      Critically ill patients:  140 - 180 mg/dL   Lab Results  Component Value Date   GLUCAP 229 (H) 11/29/2022   HGBA1C 6.1 (H) 08/13/2022    Review of Glycemic Control  Diabetes history: Type 2 DM Outpatient Diabetes medications: Basaglar 16 units QD Current orders for Inpatient glycemic control: Semglee 12 units QD, Novolog 0-15 units Q4H Vital @ 55 ml/hr   Inpatient Diabetes Program Recommendations:     Consider adding Novolog 3 units Q4H for tube feed coverage (to be stopped or held in the event tube feeds stopped).  Will continue to follow while inpatient.  Thank you, Reche Dixon, MSN, [place] Diabetes Coordinator Inpatient Diabetes Program [phone] (team pager from 8a-5p)

## 2022-11-29 NOTE — Consult Note (Signed)
Palliative Medicine Inpatient Consult Note  Consulting Provider: Dr. Sabino Gasser  Reason for consult:   [place] [facility] Consult  Reason for Consult? [place] discussions please; slow/no improvement, failing swallow evals, cortrak still in place   11/30/2022  HPI:  Per intake H&P --> Samantha [name] is a 67 yo female with PMH CVA with residual R side deficits, HTN, DMII, HLD, LLE DVT (12/2021) who presented originally on 11/22/2022 with N/V and AMS.  Identified to be septic from MDR e.coli. Has suffered an acute CVA and is experiencing a very slow course. Remains aphasic w/ significant dysphagia requiring a coretrack.   Palliative care hs been asked to get involved to further support goals of care conversations.  Clinical Assessment/Goals of Care:  *Please note that this is a verbal dictation therefore any spelling or grammatical errors are due to the "[employer] One" system interpretation.  I have reviewed medical records including EPIC notes, labs and imaging, received report from bedside RN, assessed the patient who is grimacing and verbally moaning   I met with patients spouse, Samantha [name]  to further discuss diagnosis prognosis, GOC, EOL wishes, disposition and options.   I introduced Palliative Medicine as specialized medical care for people living with serious illness. It focuses on providing relief from the symptoms and stress of a serious illness. The goal is to improve quality of life for both the patient and the family.  Medical History Review and Understanding:  I reviewed with Samantha [name] Samantha [name]'s past medical history significant for CVAs, hypertension, diabetes mellitus, hyperlipidemia, DVT, recent septic shock.  Social History:  Samantha [name] lives in [place].  She and her husband have been married for 44 years and share 4 children together.  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   Functional and Nutritional State:  Prior to hospitalization Samantha [name] was able to mobilize with a front wheel walker, speak, she needed help with bathing and dressing as well as meal preparation after her second stroke but for the most part she was doing all right per her husband.  She did have a hearty appetite in the home.  Advance Directives:  A detailed discussion was had today regarding advanced directives.  Patient's surrogate decision maker is her husband, Samantha [name].  Code Status:  Concepts specific to code status, artifical feeding and hydration, continued IV antibiotics and rehospitalization was had.  The difference between a aggressive medical intervention path  and a palliative comfort care path for this patient at this time was had.   Samantha [name] is not at a point where he is ready to transition Samantha [name] to a DO NOT RESUSCITATE.  He expresses the desire to continue with full code and full scope of care efforts to prolong Samantha [name]'s life.  Discussion:  Samantha [name] and I had an open and honest conversation regarding the present illnesses Abbie has been afflicted by in addition to her chronic underlying diseases.  We reviewed that she had little reserve to begin with and unfortunately this hospitalization which has been prolonged has taken quite a toll on her mind, body, and spirit.  I described the chronic disease trajectory to [facility] and what occurs when you have an acute event recover from that but never recover to your prior baseline.  We reviewed that each acute event results  and a lower level of functioning.  I shared with Samantha [name] that it is imperative to think about the long-term at this time. I shared things like long-term nutrition, daily care needs, quality of life, and medical expectations are all needed to consider in patient's near future.  I shared that since patient's hospitalization  she has made little to no improvements and this may be her new baseline level of functioning.  Patient's spouse asked why she is not arousing significantly.  We discussed delirium/encephalopathy and how patients in the setting of acute on chronic illness can have to months she was prolonged courses.  We reviewed that some patients can improve meaningfully from these while other patients can remain in a significantly declined state and again reach a new baseline secondary to that.  Patient's spouse and I reviewed that he would ideally like to speak to a neurologist to get a better indication of her neurological state and understand why she might not be arousing.  I shared that I would pass along to the primary team.  I offered to set up a meeting with Samantha [name] as well as his 4 children so we could all speak together and determine what the best course moving forward is.  I shared that at this state Samantha [name] will require a higher level of care than just going home which Samantha [name] does agree with.  Discussed the importance of continued conversation with family and their  medical providers regarding overall plan of care and treatment options, ensuring decisions are within the context of the patients values and GOCs.  Decision Maker: Samantha [name] (Spouse): [phone] (Mobile)   SUMMARY OF RECOMMENDATIONS   FULL CODE/ FULL SCOPE OF CARE  Patient's spouse would like [place] to do everything to improve patient's health condition  Open and honest conversations held in the setting of patient's acute on chronic illness  Patient's spouse is agreeable to additional conversations in the oncoming days we have plan to meet tomorrow at 3 PM after he receives formal medical updates  Pain: Tylenol 500mg  through NGT TID  Ongoing palliative care support  Code Status/Advance Care Planning: FULL CODE  Palliative Prophylaxis:  Aspiration, Bowel Regimen, Delirium Protocol, Frequent Pain Assessment, Oral Care, Palliative Wound  Care, and Turn Reposition  Additional Recommendations (Limitations, Scope, Preferences): Continue current care  Psycho-social/Spiritual:  Desire for further Chaplaincy support: Patient has a strong [place] faith Additional Recommendations: Discussion of long term planning in setting of chronic disease burden   Prognosis: Exceptionally poor  Discharge Planning: Discharge plan is uncertain, patient will require likely long term placement.  Vitals:   11/28/22 2347 11/29/22 0353  BP: (!) 147/73 138/70  Pulse: 92 96  Resp: 20 19  Temp: 98.6 F (37 C) 98.6 F (37 C)  SpO2: 95% 96%    Intake/Output Summary (Last 24 hours) at 11/29/2022 4403 Last data filed at 11/29/2022 0700 Gross per 24 hour  Intake 2052.82 ml  Output 1075 ml  Net 977.82 ml   Last Weight  Most recent update: 11/29/2022  5:00 AM    Weight  118.1 kg (260 lb 5.8 oz)            Gen:  Elderly AA F in moderate distress HEENT: coretrack, dry mucous membranes CV: Regular rate and rhythm  PULM: On 2LPM, breathing is even and nonlabored ABD: soft/nontender EXT: LE edema  Neuro: Not responsive to verbal commands  PPS:   This conversation/these recommendations were discussed with patient primary care team, Dr. Sabino Gasser  Billing based  on MDM: High  Problems Addressed: One acute or chronic illness or injury that poses a threat to life or bodily function  Amount and/or Complexity of Data: Category 3:Discussion of management or test interpretation with external physician/other qualified health care professional/appropriate source (not separately reported)  Risks: Decision regarding hospitalization or escalation of hospital care and Decision not to resuscitate or to de-escalate care because of poor prognosis ______________________________________________________ [place] Team Team Cell Phone: [phone] Please utilize secure chat with additional questions, if there is no  response within 30 minutes please call the above phone number  Palliative Medicine Team providers are available by phone from 7am to 7pm daily and can be reached through the team cell phone.  Should this patient require assistance outside of these hours, please call the patient's attending physician.

## 2022-11-29 NOTE — Care Management Important Message (Signed)
Important Message  Patient Details  Name: Samantha [name] MRN: [id number] Date of Birth: 04-07-1956   Medicare Important Message Given:  Yes     Edilia Ghuman Montine Circle 11/29/2022, 3:29 PM

## 2022-11-29 NOTE — Progress Notes (Signed)
Speech Language Pathology Treatment: Dysphagia  Patient Details Name: Samantha [name] MRN: [id number] DOB: 02-13-1956 Today's Date: 11/29/2022 Time: 3335-4562 SLP Time Calculation (min) (ACUTE ONLY): 11 min  Assessment / Plan / Recommendation Clinical Impression  Pt was drowsy and exhibited poor awareness of presentation of thin liquids through a straw. Pt's awareness benefited from hand over hand assist to bring straw to mouth. Initiation of intake appears delayed as she would receive straw in her mouth but appeared to make no effort to suck. After SLP provided small amounts of water siphoned through the straw, pt's awareness increased further, and pt then took a single sip through the straw. This was immediately followed by coughing and pt declined any further PO trials. Additional opportunities to interact with POs and swallow with RN support for hand over hand assistance will give pt her best opportunity to increase pt's overall interest and awareness. Recommend continuing full liquid diet with full supervision.    HPI HPI: 67 year old woman who presented [facility] ED November 26, 2022 via EMS for AMS.  Pt found to be septic due to soft tissue infection around perineum. Head CT 11/26/22 and CXR 1/23 with no acute findings. PMHx significant for HTN, HLD, T2DM, CVA with R-sided deficits, LLE DVT (12/2021, previously on Eliquis), depression. No hx dysphagia from prior CVA.      SLP Plan  Continue with current plan of care      Recommendations for follow up therapy are one component of a multi-disciplinary discharge planning process, led by the attending physician.  Recommendations may be updated based on patient status, additional functional criteria and insurance authorization.    Recommendations  Diet recommendations: Thin liquid Liquids provided via: Straw Medication Administration: Via alternative means Supervision: Full supervision/cueing for compensatory strategies Compensations: Slow rate;Small  sips/bites Postural Changes and/or Swallow Maneuvers: Seated upright 90 degrees                Oral Care Recommendations: Oral care BID Follow Up Recommendations: Skilled nursing-short term rehab (<3 hours/day) Assistance recommended at discharge: Frequent or constant Supervision/Assistance SLP Visit Diagnosis: Dysphagia, unspecified (R13.10);Dysphagia, oral phase (R13.11) Plan: Continue with current plan of care          Fabio Asa., Student SLP  11/29/2022, 11:32 AM

## 2022-11-29 DEATH — deceased

## 2022-11-30 DIAGNOSIS — L02215 Cutaneous abscess of perineum: Secondary | ICD-10-CM | POA: Diagnosis not present

## 2022-11-30 DIAGNOSIS — Z515 Encounter for palliative care: Secondary | ICD-10-CM | POA: Diagnosis not present

## 2022-11-30 DIAGNOSIS — Z7189 Other specified counseling: Secondary | ICD-10-CM

## 2022-11-30 DIAGNOSIS — N179 Acute kidney failure, unspecified: Secondary | ICD-10-CM | POA: Diagnosis not present

## 2022-11-30 DIAGNOSIS — R627 Adult failure to thrive: Secondary | ICD-10-CM | POA: Insufficient documentation

## 2022-11-30 DIAGNOSIS — A419 Sepsis, unspecified organism: Secondary | ICD-10-CM | POA: Diagnosis not present

## 2022-11-30 LAB — BPAM RBC
Blood Product Expiration Date: 202403012359
ISSUE DATE / TIME: 202402011051
Unit Type and Rh: 6200

## 2022-11-30 LAB — CBC WITH DIFFERENTIAL/PLATELET
Abs Immature Granulocytes: 0.82 10*3/uL — ABNORMAL HIGH (ref 0.00–0.07)
Basophils Absolute: 0.1 10*3/uL (ref 0.0–0.1)
Basophils Relative: 0 %
Eosinophils Absolute: 0.1 10*3/uL (ref 0.0–0.5)
Eosinophils Relative: 0 %
HCT: 25.4 % — ABNORMAL LOW (ref 36.0–46.0)
Hemoglobin: 8.4 g/dL — ABNORMAL LOW (ref 12.0–15.0)
Immature Granulocytes: 5 %
Lymphocytes Relative: 11 %
Lymphs Abs: 1.9 10*3/uL (ref 0.7–4.0)
MCH: 31 pg (ref 26.0–34.0)
MCHC: 33.1 g/dL (ref 30.0–36.0)
MCV: 93.7 fL (ref 80.0–100.0)
Monocytes Absolute: 1.7 10*3/uL — ABNORMAL HIGH (ref 0.1–1.0)
Monocytes Relative: 10 %
Neutro Abs: 12.1 10*3/uL — ABNORMAL HIGH (ref 1.7–7.7)
Neutrophils Relative %: 74 %
Platelets: 79 10*3/uL — ABNORMAL LOW (ref 150–400)
RBC: 2.71 MIL/uL — ABNORMAL LOW (ref 3.87–5.11)
RDW: 22.5 % — ABNORMAL HIGH (ref 11.5–15.5)
WBC: 16.6 10*3/uL — ABNORMAL HIGH (ref 4.0–10.5)

## 2022-11-30 LAB — TYPE AND SCREEN
ABO/RH(D): A POS
Antibody Screen: NEGATIVE
Unit division: 0

## 2022-11-30 LAB — GLUCOSE, CAPILLARY
Glucose-Capillary: 219 mg/dL — ABNORMAL HIGH (ref 70–99)
Glucose-Capillary: 240 mg/dL — ABNORMAL HIGH (ref 70–99)
Glucose-Capillary: 244 mg/dL — ABNORMAL HIGH (ref 70–99)
Glucose-Capillary: 249 mg/dL — ABNORMAL HIGH (ref 70–99)
Glucose-Capillary: 263 mg/dL — ABNORMAL HIGH (ref 70–99)
Glucose-Capillary: 270 mg/dL — ABNORMAL HIGH (ref 70–99)

## 2022-11-30 LAB — BASIC METABOLIC PANEL
Anion gap: 8 (ref 5–15)
BUN: 89 mg/dL — ABNORMAL HIGH (ref 8–23)
CO2: 19 mmol/L — ABNORMAL LOW (ref 22–32)
Calcium: 7.7 mg/dL — ABNORMAL LOW (ref 8.9–10.3)
Chloride: 108 mmol/L (ref 98–111)
Creatinine, Ser: 2.41 mg/dL — ABNORMAL HIGH (ref 0.44–1.00)
GFR, Estimated: 22 mL/min — ABNORMAL LOW (ref >60)
Glucose, Bld: 269 mg/dL — ABNORMAL HIGH (ref 70–99)
Potassium: 3.9 mmol/L (ref 3.5–5.1)
Sodium: 135 mmol/L (ref 135–145)

## 2022-11-30 LAB — MAGNESIUM: Magnesium: 1.7 mg/dL (ref 1.7–2.4)

## 2022-11-30 MED ORDER — SODIUM CHLORIDE 0.9 % IV SOLN: INTRAVENOUS | Status: DC

## 2022-11-30 MED ORDER — ACETAMINOPHEN 160 MG/5ML PO SOLN
500.0000 mg | Freq: Three times a day (TID) | ORAL | Status: DC
  Administered 2022-11-30 – 2022-12-01 (×4): 500 mg via ORAL
  Filled 2022-11-30 (×4): qty 20.3

## 2022-11-30 MED ORDER — INSULIN ASPART 100 UNIT/ML IJ SOLN
3.0000 [IU] | INTRAMUSCULAR | Status: DC
  Administered 2022-11-30 – 2022-12-01 (×6): 3 [IU] via SUBCUTANEOUS

## 2022-11-30 MED ORDER — INSULIN GLARGINE-YFGN 100 UNIT/ML ~~LOC~~ SOLN
15.0000 [IU] | Freq: Every day | SUBCUTANEOUS | Status: DC
  Administered 2022-11-30 – 2022-12-01 (×2): 15 [IU] via SUBCUTANEOUS
  Filled 2022-11-30 (×2): qty 0.15

## 2022-11-30 NOTE — Progress Notes (Signed)
Patient transferred to 5W. CSW will follow for medical stability for SNF referral. Pf note, patient's managed Medicare will likely not approve SNF if patient is unable to participate in rehab (and if they do approve, will likely only do so for one week). Will need to discuss a long term care Medicaid application with spouse, which could take up to 91 days for approval.  Gilmore Laroche, MSW, [facility]

## 2022-11-30 NOTE — Progress Notes (Signed)
Progress Note    Samantha [name]   TKW:[id number]  DOB: [birth year]  DOA: November 22, 2022     11 PCP: [name], PA-C  Initial CC: AMS  Hospital Course: Ms. Samantha [name] is a 67 yo female with PMH CVA with residual R side deficits, HTN, DMII, HLD, LLE DVT (12/2021) who presented originally on 11-22-2022 with N/V and AMS. Significant Hospital Events:  11/22/22 - Presented to [facility] for AMS x 2 days. Elevated WBC, soft BP, dirty UA with frank pus on I&O cath. CT Head NAICA, CT A/P as above with concern for perianal ST infection. CCS consulted. Lactate uptrending > 9.0. PCCM consulted for ICU admission. Urine culture MDR e. Coli. blood uclutre positive for staph capitis 1/23 OBGYN consulted for concern for possible labia abscess; DVT US showing RLE dvt involving R femoral vein; BC 1/4 staph capitis; Urine culture showing E. Coli 1/26 surgical drainage and Penrose drain placement 1/26 wound culture >> budding yeast, rare GPC, rare GNR, rare GPR >> e. Coli pan sensitive 1/26 linezolid changed to vancomycin 1/27 MRI brain > no acute intracranial abnormality.  Remote infarcts in the left corona radiata and inferior right cerebellum with expected evolution and remote blood products.  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  Husband was present bedside this morning.  I was able to speak with palliative care prior to my visit with the patient and her husband.  I was open and honest with husband regarding her lack of improvement and my concern for her ongoing discomfort.  She has continued to fail swallow evaluations and have extremely poor oral intake with trial of liquids recently.  She has had a slow progressive decline since her second stroke in March 2023. Her husband wishes to have some further discussions with  family prior to making further changes regarding treatment going forward.  I told him I did not recommend a PEG tube given her generalized decline and worsening quality of life. He seemed overwhelmed but endorses needing time to process and discuss with family which is reasonable and appropriate.  Assessment and Plan:  Septic shock from perineal abscess, e. Coli UTI - shock now resolved - s/p I&D on 11/22/2022 -Wound culture noted as polymicrobial: E. coli, Candida albicans/glabrata, and Bacteroides -Transition antibiotics to micafungin, Ancef, Flagyl and monitor clinically   Atrial fibrillation with RVR -Continue metoprolol -Rate now controlled  Acute metabolic encephalopathy Hx CVA -Residual right-sided deficits from stroke.  May also have residual baseline aphasia per family - still has cortrak but trial of CLD though not taking any in still - Palliative care consulted to help initiate [place] discussions with family regarding next steps as her recovery appears very slow versus new baseline -I spoke with husband bedside on 11/30/2022.  I recommended against PEG placement and to consider further discussions with family regarding a transition to more of a comfort route given her poor clinical status overall.   AKI on CKD3b - baseline creat ~1.4 - continue TF and FW - creat starting to worsen; last imaging on 2022/11/22 via CT showing B/L nephrolithiasis but nonobstructing and no hydronephrosis - started on IVF on 2/1 but no improvement in renal function  - at risk for ATN vs obstruction; UOP decreasing; foley in plase - continue IVF; hold off on albumin; overall this speaks to poor/worsening prognosis; she would  not be a good HD candidate  Hypocalcemia - s/p replacement    Thrombocytopenia Normocytic anemia R femoral vein DVT diagnosed 11/20/22 LLE DVT (posterior tibial, peroneal) 12/2021 - Underwent IVC placement on 11/26/2022 with IR - RUE duplex negative for DVT from 11/27/22 - Would plan to  retrieve IVC filter when platelets back to normal in 4-6 weeks and resume lifelong anticoagulation -Hgb down to 7.1 g/dL on 11/29/2022, will go ahead and transfuse 1 unit PRBC   History of CVA with residual right sided deficits PT -Continue Lipitor   Hyeprtension Hyperlipidemia Monitor hemodynamics  Restart amlodipine as BP returns to baseline Atorvastatin -Continue metoprolol   R gluteal pressure wound POA Wound care   DM2 with hyperglycemia SSI to continue   Depression lexapro   Malnutrition, severe with severe hypoalbuminemia Tube feeding to continue  Old records reviewed in assessment of this patient  Antimicrobials: Clindamycin 11/19/2022 >> 11/19/2022 Cefepime 11/12/2022 >> 11/25/2022 Rocephin 11/26/2022 x 1 Fluconazole 11/25/2022 >> 11/27/22 Zyvox 11/19/2022 >> 11/26/2022 Vanc 1/21 x 1 and 1/27 x 1 Micafungin 11/24/22 x 1 Flagyl 11/20/2022 >> current Micafungin 11/27/22 >> current Ancef 11/27/2022 >> current  DVT prophylaxis:  SCDs Start: 11/19/22 0425   Code Status:   Code Status: Full Code  Mobility Assessment (last 72 hours)     Mobility Assessment     Row Name 11/30/22 0745 11/30/22 0400 11/30/22 0000 11/29/22 2000 11/29/22 1300   Does patient have an order for bedrest or is patient medically unstable No - Continue assessment No - Continue assessment No - Continue assessment No - Continue assessment No - Continue assessment   What is the highest level of mobility based on the progressive mobility assessment? Level 1 (Bedfast) - Unable to balance while sitting on edge of bed Level 1 (Bedfast) - Unable to balance while sitting on edge of bed Level 1 (Bedfast) - Unable to balance while sitting on edge of bed Level 1 (Bedfast) - Unable to balance while sitting on edge of bed Level 1 (Bedfast) - Unable to balance while sitting on edge of bed    Row Name 11/28/22 0935           What is the highest level of mobility based on the progressive mobility assessment? Level 1  (Bedfast) - Unable to balance while sitting on edge of bed                Barriers to discharge:  Disposition Plan:   Status is: Inpt  Objective: Blood pressure 126/68, pulse (!) 102, temperature 99.7 F (37.6 C), temperature source Axillary, resp. rate 17, height 5' (1.524 m), weight 118.1 kg, SpO2 96 %.  Examination:  Physical Exam Constitutional:      Comments: Awake and alert but not following commands  HENT:     Head: Normocephalic and atraumatic.     Mouth/Throat:     Mouth: Mucous membranes are moist.  Eyes:     Extraocular Movements: Extraocular movements intact.  Cardiovascular:     Rate and Rhythm: Normal rate.  Pulmonary:     Effort: Pulmonary effort is normal.     Breath sounds: Normal breath sounds.  Abdominal:     General: Bowel sounds are normal.     Palpations: Abdomen is soft.  Musculoskeletal:     Cervical back: Normal range of motion.     Comments: Right upper and lower extremity with 3+ edema  Skin:    General: Skin is warm.  Neurological:  Comments: Moves all 4 extremities less on the right side.  Unable to follow commands      Consultants:  General surgery Palliative Care  Procedures:  11/06/2022: EUA/I&D of left labial abscess  Data Reviewed: Results for orders placed or performed during the hospital encounter of 2022/12/11 (from the past 24 hour(s))  Glucose, capillary     Status: Abnormal   Collection Time: 11/29/22  3:47 PM  Result Value Ref Range   Glucose-Capillary 253 (H) 70 - 99 mg/dL  Hemoglobin and hematocrit, blood     Status: Abnormal   Collection Time: 11/29/22  5:15 PM  Result Value Ref Range   Hemoglobin 8.8 (L) 12.0 - 15.0 g/dL   HCT 26.2 (L) 36.0 - 46.0 %  Glucose, capillary     Status: Abnormal   Collection Time: 11/29/22  9:46 PM  Result Value Ref Range   Glucose-Capillary 334 (H) 70 - 99 mg/dL  Glucose, capillary     Status: Abnormal   Collection Time: 11/29/22 11:19 PM  Result Value Ref Range    Glucose-Capillary 316 (H) 70 - 99 mg/dL  Basic metabolic panel     Status: Abnormal   Collection Time: 11/30/22  3:13 AM  Result Value Ref Range   Sodium 135 135 - 145 mmol/L   Potassium 3.9 3.5 - 5.1 mmol/L   Chloride 108 98 - 111 mmol/L   CO2 19 (L) 22 - 32 mmol/L   Glucose, Bld 269 (H) 70 - 99 mg/dL   BUN 89 (H) 8 - 23 mg/dL   Creatinine, Ser 2.41 (H) 0.44 - 1.00 mg/dL   Calcium 7.7 (L) 8.9 - 10.3 mg/dL   GFR, Estimated 22 (L) >60 mL/min   Anion gap 8 5 - 15  CBC with Differential/Platelet     Status: Abnormal   Collection Time: 11/30/22  3:13 AM  Result Value Ref Range   WBC 16.6 (H) 4.0 - 10.5 K/uL   RBC 2.71 (L) 3.87 - 5.11 MIL/uL   Hemoglobin 8.4 (L) 12.0 - 15.0 g/dL   HCT 25.4 (L) 36.0 - 46.0 %   MCV 93.7 80.0 - 100.0 fL   MCH 31.0 26.0 - 34.0 pg   MCHC 33.1 30.0 - 36.0 g/dL   RDW 22.5 (H) 11.5 - 15.5 %   Platelets 79 (L) 150 - 400 K/uL   Neutrophils Relative % 74 %   Neutro Abs 12.1 (H) 1.7 - 7.7 K/uL   Lymphocytes Relative 11 %   Lymphs Abs 1.9 0.7 - 4.0 K/uL   Monocytes Relative 10 %   Monocytes Absolute 1.7 (H) 0.1 - 1.0 K/uL   Eosinophils Relative 0 %   Eosinophils Absolute 0.1 0.0 - 0.5 K/uL   Basophils Relative 0 %   Basophils Absolute 0.1 0.0 - 0.1 K/uL   Immature Granulocytes 5 %   Abs Immature Granulocytes 0.82 (H) 0.00 - 0.07 K/uL  Magnesium     Status: None   Collection Time: 11/30/22  3:13 AM  Result Value Ref Range   Magnesium 1.7 1.7 - 2.4 mg/dL  Glucose, capillary     Status: Abnormal   Collection Time: 11/30/22  4:52 AM  Result Value Ref Range   Glucose-Capillary 263 (H) 70 - 99 mg/dL  Glucose, capillary     Status: Abnormal   Collection Time: 11/30/22  8:18 AM  Result Value Ref Range   Glucose-Capillary 270 (H) 70 - 99 mg/dL  Glucose, capillary     Status: Abnormal   Collection Time:  11/30/22 12:06 PM  Result Value Ref Range   Glucose-Capillary 240 (H) 70 - 99 mg/dL    I have reviewed pertinent nursing notes, vitals, labs, and images as  necessary. I have ordered labwork to follow up on as indicated.  I have reviewed the last notes from staff over past 24 hours. I have discussed patient's care plan and test results with nursing staff, CM/SW, and other staff as appropriate.  Time spent: Greater than 50% of the 55 minute visit was spent in counseling/coordination of care for the patient as laid out in the A&P.   LOS: 11 days   Lewie Chamber, MD [facility] 11/30/2022, 12:22 PM

## 2022-11-30 NOTE — Progress Notes (Signed)
Physical Therapy Treatment Patient Details Name: Samantha [name] MRN: [id number] DOB: 05-07-1956 Today's Date: 11/30/2022   History of Present Illness 67 y.o. F admitted 11/19/2022 with sepsis due to UTI and extensive subcutaneous gas/debris within L perineal soft tissue, tracking along posterior Lt labia majora/perianal tissue. 1/26 I&D of left labia. 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    PT Comments    Pt limited during today's session by lethargy, groaning upon arrival but able to be repositioned and noted decrease in groans/grimacing. Pt rarely following commands but opening eyes, intermittent use of LUE. PROM of BLE performed, attempting AAROM for heel slides with LLE, but little participation from pt. Pt provided towel roll under pillow as significant L cervical rotation noted, unable to get to neutral head position but improved overall. Attempted reaching to R for rolling for bed mobility, small reach noted with LUE however pt not fully participating so unable to roll. Acute PT will continue to follow, discharge plan remains appropriate.     Recommendations for follow up therapy are one component of a multi-disciplinary discharge planning process, led by the attending physician.  Recommendations may be updated based on patient status, additional functional criteria and insurance authorization.  Follow Up Recommendations  Skilled nursing-short term rehab (<3 hours/day) Can patient physically be transported by private vehicle: No   Assistance Recommended at Discharge Frequent or constant Supervision/Assistance  Patient can return home with the following Two people to help with walking and/or transfers;Two people to help with bathing/dressing/bathroom;Assistance with cooking/housework;Direct supervision/assist for medications management;Direct supervision/assist for financial management;Assist for transportation;Help with stairs or  ramp for entrance;Assistance with feeding   Equipment Recommendations  Hospital bed;Other (comment) (hoyer lift, air overlay)    Recommendations for Other Services       Precautions / Restrictions Precautions Precautions: Fall;Other (comment) Precaution Comments: cortrak, rt hemiparesis, multiple sacral/labial wounds Restrictions Weight Bearing Restrictions: No     Mobility  Bed Mobility Overal bed mobility: Needs Assistance Bed Mobility: Rolling Rolling: Max assist, +2 for physical assistance         General bed mobility comments: maxAx2 to reposition to midline in bed, attempting reaching towards the R with the L, however pt not following commands to complete a full roll    Transfers                   General transfer comment: unable    Ambulation/Gait                   Stairs             Wheelchair Mobility    Modified Rankin (Stroke Patients Only)       Balance                                            Cognition Arousal/Alertness: Lethargic Behavior During Therapy: Flat affect Overall Cognitive Status: History of cognitive impairments - at baseline                                 General Comments: pt lethargic throughout session, intermittently opening eyes to auditory and tactile stimuli, intermittent use of LUE and following ~2-3 commands throughout session (squeezing hand/looking a certain way)        Exercises  General Exercises - Lower Extremity Heel Slides: PROM, Supine, Both, 10 reps (attempted AAROM on L but limited participation)    General Comments General comments (skin integrity, edema, etc.): husband present throughout session, pt intermittently groaning, repositioned in bed, VSS throughout on room air      Pertinent Vitals/Pain Pain Assessment Pain Assessment: Faces Faces Pain Scale: Hurts a little bit Pain Location: generalized Pain Descriptors / Indicators: Grimacing,  Moaning Pain Intervention(s): Monitored during session, Repositioned    Home Living                          Prior Function            PT Goals (current goals can now be found in the care plan section) Acute Rehab PT Goals Patient Stated Goal: pt could not state PT Goal Formulation: With patient Time For Goal Achievement: 12/04/22 Potential to Achieve Goals: Fair Progress towards PT goals: Not progressing toward goals - comment (limited by lethargy affecting participation during today's session)    Frequency    Min 2X/week      PT Plan Current plan remains appropriate    Co-evaluation              AM-PAC PT "6 Clicks" Mobility   Outcome Measure  Help needed turning from your back to your side while in a flat bed without using bedrails?: Total Help needed moving from lying on your back to sitting on the side of a flat bed without using bedrails?: Total Help needed moving to and from a bed to a chair (including a wheelchair)?: Total Help needed standing up from a chair using your arms (e.g., wheelchair or bedside chair)?: Total Help needed to walk in hospital room?: Total Help needed climbing 3-5 steps with a railing? : Total 6 Click Score: 6    End of Session   Activity Tolerance: Patient limited by lethargy Patient left: in bed;with call bell/phone within reach;with bed alarm set;with family/visitor present Nurse Communication: Mobility status PT Visit Diagnosis: Other abnormalities of gait and mobility (R26.89);Hemiplegia and hemiparesis Hemiplegia - Right/Left: Right Hemiplegia - caused by: Cerebral infarction     Time: 1002-1017 PT Time Calculation (min) (ACUTE ONLY): 15 min  Charges:  $Therapeutic Activity: 8-22 mins                     Charlynne Cousins, PT DPT Acute Rehabilitation Services Office [phone]    Luvenia Heller 11/30/2022, 2:09 PM

## 2022-11-30 NOTE — Progress Notes (Signed)
Nutrition Follow-up  DOCUMENTATION CODES:   Severe malnutrition in context of chronic illness  INTERVENTION:   Continue tube feeding via cortrak tube: Vital 1.5 at 55 mL/hour (1320 ml goal daily volume) PROSource TF20 60 mL once daily per tube Goal regimen provides: 2060 kcal, 109 g of protein, 1003 mL H2O daily Continue Thiamine 100 mg x 5 days, low phosphorus. Possible refeeding Continue 1 packet Juven BID per tube, each packet provides 95 calories, 2.5 grams of protein (collagen), and 9.8 grams of carbohydrate (3 grams sugar); also contains 7 grams of L-arginine and L-glutamine, 300 mg vitamin C, 15 mg vitamin E, 1.2 mcg vitamin B-12, 9.5 mg zinc, 200 mg calcium, and 1.5 g  Calcium Beta-hydroxy-Beta-methylbutyrate to support wound healing  NUTRITION DIAGNOSIS:   Severe Malnutrition related to chronic illness as evidenced by energy intake < or equal to 75% for > or equal to 1 month, percent weight loss, mild fat depletion, moderate muscle depletion. - Ongoing, being addressed via TF  GOAL:   Patient will meet greater than or equal to 90% of their needs - Met via TF  MONITOR:   Diet advancement, Labs, I & O's, TF tolerance  REASON FOR ASSESSMENT:   Consult Assessment of nutrition requirement/status, Enteral/tube feeding initiation and management  ASSESSMENT:   67 year old female with PMHx of HTN, HLD, diabetes type 2, LLE DVT (12/2021, previously on Eliquis), CVA with R-sided deficits, depression admitted with AMS, SSTI of perineal soft tissues, sepsis (urologic source vs progressive SSTI), AKI.  1/21 - Admitted 1/22 - Cortrak tube placed  1/27 - SLP evaluation, NPO 1/29 - SLP evaluation, NPO 1/31 - SLP evaluation, diet advanced to full liquids  Pt laying in bed. RN and nursing student at bedside. Husband with PMT, having ongoing [place] conversations. TF running at goal. RN no concerns.   Pt currently on full liquids for therapeutic effect, would not expect pt to be able to  have good PO intake.   Medications reviewed and include: NovoLog SSI + 3 units q4h, Semglee, Flagyl, Thiamine, IV antibiotics, IV micafungin Labs reviewed: Sodium 135, Potassium 3.9, BUN 89, Creatinine 2.41, Magnesium 1.7, Phosphorus 2.4 (11/26/22), 24 hr CBGs 242-334  Diet Order:   Diet Order             Diet full liquid Room service appropriate? Yes; Fluid consistency: Thin  Diet effective now                   EDUCATION NEEDS:   Not appropriate for education at this time  Skin:  Skin Assessment: Skin Integrity Issues: Skin Integrity Issues:: Stage II, Other (Comment) Stage II: right buttocks (2cm x 1cm x 0.2cm) Other: DTPI left burrocks (8cm x 7cm); 8cm x 9cm area of erythema with 5-6 small areas of partial thickness skin loss at sacrum; partial thickness skin loss right anterior thigh (3cm x 2.2cm x 0.1cm); erythema intertrigo with partial thickness skin loss at mons pubis  Last BM:  2/1  Height:  Ht Readings from Last 1 Encounters:  11/22/22 5' (1.524 m)   Weight:  Wt Readings from Last 1 Encounters:  11/29/22 118.1 kg   Ideal Body Weight:  45.5 kg  BMI:  Body mass index is 50.85 kg/m.  Estimated Nutritional Needs:  Kcal:  1900-2100 kcal/d Protein:  100-115g/d Fluid:  2-2.2 L/day   Hermina Barters RD, LDN Clinical Dietitian See [facility] for contact information.

## 2022-12-01 DIAGNOSIS — Z7189 Other specified counseling: Secondary | ICD-10-CM | POA: Diagnosis not present

## 2022-12-01 DIAGNOSIS — N179 Acute kidney failure, unspecified: Secondary | ICD-10-CM | POA: Diagnosis not present

## 2022-12-01 DIAGNOSIS — L02215 Cutaneous abscess of perineum: Secondary | ICD-10-CM | POA: Diagnosis not present

## 2022-12-01 DIAGNOSIS — A419 Sepsis, unspecified organism: Secondary | ICD-10-CM | POA: Diagnosis not present

## 2022-12-01 DIAGNOSIS — I6930 Unspecified sequelae of cerebral infarction: Secondary | ICD-10-CM

## 2022-12-01 DIAGNOSIS — Z515 Encounter for palliative care: Secondary | ICD-10-CM | POA: Diagnosis not present

## 2022-12-01 DIAGNOSIS — E43 Unspecified severe protein-calorie malnutrition: Secondary | ICD-10-CM | POA: Diagnosis not present

## 2022-12-01 LAB — CBC WITH DIFFERENTIAL/PLATELET
Abs Immature Granulocytes: 0.3 K/uL — ABNORMAL HIGH (ref 0.00–0.07)
Basophils Absolute: 0.2 K/uL — ABNORMAL HIGH (ref 0.0–0.1)
Basophils Relative: 1 %
Eosinophils Absolute: 0.3 K/uL (ref 0.0–0.5)
Eosinophils Relative: 2 %
HCT: 25.7 % — ABNORMAL LOW (ref 36.0–46.0)
Hemoglobin: 8 g/dL — ABNORMAL LOW (ref 12.0–15.0)
Lymphocytes Relative: 11 %
Lymphs Abs: 1.9 K/uL (ref 0.7–4.0)
MCH: 30.5 pg (ref 26.0–34.0)
MCHC: 31.1 g/dL (ref 30.0–36.0)
MCV: 98.1 fL (ref 80.0–100.0)
Metamyelocytes Relative: 2 %
Monocytes Absolute: 0.7 K/uL (ref 0.1–1.0)
Monocytes Relative: 4 %
Neutro Abs: 13.8 K/uL — ABNORMAL HIGH (ref 1.7–7.7)
Neutrophils Relative %: 80 %
Platelets: 109 K/uL — ABNORMAL LOW (ref 150–400)
RBC: 2.62 MIL/uL — ABNORMAL LOW (ref 3.87–5.11)
RDW: 23.6 % — ABNORMAL HIGH (ref 11.5–15.5)
WBC: 17.3 K/uL — ABNORMAL HIGH (ref 4.0–10.5)
nRBC: 3.4 % — ABNORMAL HIGH (ref 0.0–0.2)
nRBC: 7 /100{WBCs} — ABNORMAL HIGH

## 2022-12-01 LAB — BASIC METABOLIC PANEL
Anion gap: 10 (ref 5–15)
BUN: 112 mg/dL — ABNORMAL HIGH (ref 8–23)
CO2: 18 mmol/L — ABNORMAL LOW (ref 22–32)
Calcium: 7.8 mg/dL — ABNORMAL LOW (ref 8.9–10.3)
Chloride: 109 mmol/L (ref 98–111)
Creatinine, Ser: 2.79 mg/dL — ABNORMAL HIGH (ref 0.44–1.00)
GFR, Estimated: 18 mL/min — ABNORMAL LOW (ref >60)
Glucose, Bld: 234 mg/dL — ABNORMAL HIGH (ref 70–99)
Potassium: 4.2 mmol/L (ref 3.5–5.1)
Sodium: 137 mmol/L (ref 135–145)

## 2022-12-01 LAB — GLUCOSE, CAPILLARY
Glucose-Capillary: 221 mg/dL — ABNORMAL HIGH (ref 70–99)
Glucose-Capillary: 179 mg/dL — ABNORMAL HIGH (ref 70–99)
Glucose-Capillary: 103 mg/dL — ABNORMAL HIGH (ref 70–99)

## 2022-12-01 LAB — MAGNESIUM: Magnesium: 1.7 mg/dL (ref 1.7–2.4)

## 2022-12-01 MED ORDER — GLYCOPYRROLATE 0.2 MG/ML IJ SOLN
0.4000 mg | INTRAMUSCULAR | Status: DC | PRN
  Administered 2022-12-02 (×2): 0.4 mg via INTRAVENOUS
  Filled 2022-12-01 (×2): qty 2

## 2022-12-01 MED ORDER — LORAZEPAM 2 MG/ML IJ SOLN
1.0000 mg | Freq: Four times a day (QID) | INTRAMUSCULAR | Status: DC
  Administered 2022-12-02 (×2): 1 mg via INTRAVENOUS
  Filled 2022-12-01 (×3): qty 1

## 2022-12-01 MED ORDER — HYDROMORPHONE HCL-NACL 50-0.9 MG/50ML-% IV SOLN
1.0000 mg/h | INTRAVENOUS | Status: DC
  Administered 2022-12-01: 2 mg/h via INTRAVENOUS
  Filled 2022-12-01: qty 50

## 2022-12-01 MED ORDER — ONDANSETRON HCL 4 MG/2ML IJ SOLN
4.0000 mg | Freq: Four times a day (QID) | INTRAMUSCULAR | Status: DC | PRN
  Administered 2022-12-02: 4 mg via INTRAVENOUS
  Filled 2022-12-01: qty 2

## 2022-12-01 MED ORDER — POLYVINYL ALCOHOL 1.4 % OP SOLN: 1.0000 [drp] | Freq: Four times a day (QID) | OPHTHALMIC | Status: DC | PRN

## 2022-12-01 MED ORDER — CEFAZOLIN SODIUM-DEXTROSE 2-4 GM/100ML-% IV SOLN: 2.0000 g | Freq: Two times a day (BID) | INTRAVENOUS | Status: DC

## 2022-12-01 MED ORDER — ACETAMINOPHEN 325 MG PO TABS: 650.0000 mg | ORAL_TABLET | Freq: Four times a day (QID) | ORAL | Status: DC | PRN

## 2022-12-01 MED ORDER — ONDANSETRON 4 MG PO TBDP: 4.0000 mg | ORAL_TABLET | Freq: Four times a day (QID) | ORAL | Status: DC | PRN

## 2022-12-01 MED ORDER — ACETAMINOPHEN 650 MG RE SUPP: 650.0000 mg | Freq: Four times a day (QID) | RECTAL | Status: DC | PRN

## 2022-12-01 MED ORDER — HYDROMORPHONE BOLUS VIA INFUSION
0.5000 mg | INTRAVENOUS | Status: DC | PRN
  Administered 2022-12-02: 0.5 mg via INTRAVENOUS

## 2022-12-01 MED ORDER — BIOTENE DRY MOUTH MT LIQD: 15.0000 mL | OROMUCOSAL | Status: DC | PRN

## 2022-12-01 MED ORDER — LORAZEPAM 2 MG/ML IJ SOLN
0.5000 mg | INTRAMUSCULAR | Status: DC | PRN
  Administered 2022-12-01: 1 mg via INTRAVENOUS

## 2022-12-01 NOTE — Progress Notes (Signed)
   Palliative Medicine Inpatient Follow Up Note HPI: Samantha [name] is a 67 yo female with PMH CVA with residual R side deficits, HTN, DMII, HLD, LLE DVT (12/2021) who presented originally on 11/28/2022 with N/V and AMS.  Identified to be septic from MDR e.coli. Has suffered an acute CVA and is experiencing a very slow course. Remains aphasic w/ significant dysphagia requiring a coretrack.    Palliative care hs been asked to get involved to further support goals of care conversations.  Today's Discussion 12/01/2022  *Please note that this is a verbal dictation therefore any spelling or grammatical errors are due to the "[place] [employer]" system interpretation.  Meeting held this afternoon with patients four children.  Patients spouse, Samantha [name] was overwhelmed with emotion.   Decision made to transition care to comfort care.   We talked about transition to comfort measures in house and what that would entail inclusive of medications to control pain, dyspnea, agitation, nausea, itching, and hiccups.    We discussed stopping all uneccessary measures such as cardiac monitoring, blood draws, needle sticks, and frequent vital signs.   Utilized reflective listening throughout our time together.   SUMMARY OF RECOMMENDATIONS   DNAR/DNI  [place] NGT  Unrestricted visitation  Start dilaudid gtt for symptoms  Additional comfort medications per [facility]  Anticipate in hospital passing  Ongoing PMT support  Additional Time: 67 ______________________________________________________________________________________ [place] Team Team Cell Phone: [phone] Please utilize secure chat with additional questions, if there is no response within 30 minutes please call the above phone number  Palliative Medicine Team providers are available by phone from 7am to 7pm daily and can be reached through the team cell phone.  Should this patient  require assistance outside of these hours, please call the patient's attending physician.

## 2022-12-01 NOTE — Progress Notes (Signed)
Bathed and cleaned patient and cleaned and applied multiple mepilex borders all over her body from coccyx, groin, thighs, elbows, wrist.

## 2022-12-01 NOTE — Progress Notes (Signed)
Palliative Medicine Inpatient Follow Up Note HPI: Samantha [name] is a 67 yo female with PMH CVA with residual R side deficits, HTN, DMII, HLD, LLE DVT (12/2021) who presented originally on 11/17/2022 with N/V and AMS.  Identified to be septic from MDR e.coli. Has suffered an acute CVA and is experiencing a very slow course. Remains aphasic w/ significant dysphagia requiring a coretrack.    Palliative care hs been asked to get involved to further support goals of care conversations.  Today's Discussion 12/01/2022  *Please note that this is a verbal dictation therefore any spelling or grammatical errors are due to the "[place] [employer]" system interpretation.  Chart reviewed inclusive of vital signs, progress notes, laboratory results, and diagnostic images.  Dr. Sabino Gasser and myself received a message from patient's night nurse regarding her concerns of the patient in the setting of her ongoing pain notable in abdomen due to ongoing feeding.  Also endorses significant third spacing as well as runs of SVT.  Shared at this time that patient's daughter was at bedside.  I was able to go to bedside and speak to patient's daughter Samantha [name] who was very tearful about her mother's current health state.  We discussed the concerns in both the short and long-term outside of the room.  I shared openly and honestly that it does appear that [name]'s body is shutting down as it can no longer absorb and metabolize artificial nutrition and/or fluids.  I shared that it is evident that she is having severe discomfort and pain as a result of the efforts being made to prolong her life.  Patient's daughter shares that she too is in healthcare and understands that her mother is dying.  Her father plans to come into the hospital at 230 this afternoon and he will be the final decision maker though per Samantha [name] he is leaning towards making her comfortable.  I shared with Samantha [name] that there are some decisions which should be made more  urgently than others one of them being maples cardiopulmonary resuscitation status.  We reviewed the trauma that she would [place] should we put her through such an event and how it would all be for not given how her body is progressing at this time.  Kerri plans to speak to her father further.  Discussed the above with the primary medical team.  Questions and concerns addressed/Palliative Support Provided.   _______________________________ Addendum:  Dr. Sabino Gasser was able to speak to patient's husband and enact a DO NOT RESUSCITATE/DO NOT INTUBATE order.  Patient's spouse plans to come later in the day to further delineate the direction of care which should be pursued moving forward.  Objective Assessment: Vital Signs Vitals:   12/01/22 0600 12/01/22 0800  BP:  129/68  Pulse: 99 (!) 104  Resp: 17 17  Temp:  98.1 F (36.7 C)  SpO2: 97% 97%    Intake/Output Summary (Last 24 hours) at 12/01/2022 1045 Last data filed at 12/01/2022 0411 Gross per 24 hour  Intake 2047.32 ml  Output 2800 ml  Net -752.68 ml   Last Weight  Most recent update: 11/29/2022  5:00 AM    Weight  118.1 kg (260 lb 5.8 oz)            Gen:  Elderly AA F in moderate distress HEENT: coretrack, dry mucous membranes CV: Regular rate and rhythm  PULM: On 2LPM, breathing is even and nonlabored ABD: soft/nontender EXT: generalized edema  Neuro: Not responsive to verbal commands  SUMMARY OF  RECOMMENDATIONS   DNAR/DNI  Patients souse plans to come this afternoon for further conversations related to goals of care --> Both Dr. Sabino Gasser and myself have strongly endorsed comfort care  Plan to hold TF given the discomfort they are causing  Ongoing PMT support  Time Spent: 65 Billing based on MDM: High ______________________________________________________________________________________ [place] Team Team Cell Phone: [phone] Please utilize secure chat with  additional questions, if there is no response within 30 minutes please call the above phone number  Palliative Medicine Team providers are available by phone from 7am to 7pm daily and can be reached through the team cell phone.  Should this patient require assistance outside of these hours, please call the patient's attending physician.

## 2022-12-01 NOTE — Progress Notes (Signed)
   12/01/22 1545  Spiritual Encounters  Type of Visit Initial  Care provided to: [facility] partners present during encounter Other (comment) Architect)  Referral source Physician;Family  Reason for visit End-of-life  OnCall Visit Yes   Chaplain met with patient's daughter. Family has decided to move to comfort care measures. Chaplain offered support. Chaplain introduced spiritual care services. Spiritual care services available as needed.   Jeri Lager, Chaplain 12/01/22

## 2022-12-01 NOTE — Progress Notes (Signed)
Progress Note    Samantha [name]   DXI:[id number]  DOB: [birth year]  DOA: 11/04/2022     12 PCP: [name], PA-C  Initial CC: AMS  Hospital Course: Samantha [name] is a 67 yo female with PMH CVA with residual R side deficits, HTN, DMII, HLD, LLE DVT (12/2021) who presented originally on 11/09/2022 with N/V and AMS. Significant Hospital Events:  1/21 - Presented to [facility] for AMS x 2 days. Elevated WBC, soft BP, dirty UA with frank pus on I&O cath. CT Head NAICA, CT A/P as above with concern for perianal ST infection. CCS consulted. Lactate uptrending > 9.0. PCCM consulted for ICU admission. Urine culture MDR e. Coli. blood uclutre positive for staph capitis 1/23 OBGYN consulted for concern for possible labia abscess; DVT US showing RLE dvt involving R femoral vein; BC 1/4 staph capitis; Urine culture showing E. Coli 2022-12-14 surgical drainage and Penrose drain placement 12-14-22 wound culture >> budding yeast, rare GPC, rare GNR, rare GPR >> e. Coli pan sensitive Dec 14, 2022 linezolid changed to vancomycin 1/27 MRI brain > no acute intracranial abnormality.  Remote infarcts in the left corona radiata and inferior right cerebellum with expected evolution and remote blood products.  White matter changes extending along the corticospinal tracts to the medulla, new from prior exam consistent with wallerian degeneration 1/28 remains aphasic, some discrepancy in family regarding her code  2/3: Ongoing [place] discussions. Family has elected to transition to comfort due to decline  Interval History:  Multitude of events overnight; developing very large rectal tube output, ongoing skin breakdown and ongoing significant pain and discomfort.  Medically, she continues to develop multiorgan failure especially with worsening renal function today.  She remains noninteractive and uncomfortable when seen this morning. Daughter present bedside and I was able to update her followed by calling the patient's husband and also updating  him especially my concerns for the patient continuing to decline and approaching end-of-life imminently.  He plans to come to the hospital this afternoon to discuss further plans, notably to transition to comfort care.  During our phone conversation, he was amenable with status changing to DNR/DNI until he could make it to the hospital for further decisions.  Informed by palliative care this afternoon that after further family meeting upon husband's arrival, they have elected for transitioning to full comfort care.  We anticipate inpatient death.  She is started on a Dilaudid drip for comfort.  Assessment and Plan:  Septic shock from perineal abscess, e. Coli UTI - shock now resolved - s/p I&D on 2022/12/14 -Wound culture noted as polymicrobial: E. coli, Candida albicans/glabrata, and Bacteroides -Transitioned antibiotics to micafungin, Ancef, Flagyl  -Discontinue antibiotics now in setting of comfort care pursuit   Atrial fibrillation with RVR -Now transitioning to comfort care  Acute metabolic encephalopathy Hx CVA Failure to thrive in adult -Residual right-sided deficits from stroke.  May also have residual baseline aphasia per family -Cortrak has been in place for tube feeds - In setting of progressive decline, now transitioning to comfort care on 2/3   AKI on CKD3b - baseline creat ~1.4 -Progressively worsening renal function despite tube feeds and free water along with IV fluids -Suspect has developed ATN.  Low suspicion for obstruction given Foley in place. -Not a dialysis candidate in setting of her multiple comorbidities -agree with transitioning to comfort care as noted above  Hypocalcemia - s/p replacement    Thrombocytopenia Normocytic anemia R femoral vein DVT diagnosed 11/20/22 LLE DVT (posterior tibial, peroneal) 12/2021 -  Underwent IVC placement on 11/26/2022 with IR - RUE duplex negative for DVT from 11/27/22 -No longer will retrieve IVC filter given transition to  comfort care   History of CVA with residual right sided deficits - progressive decline since last stroke   Hyeprtension Hyperlipidemia - continue comfort care   R gluteal pressure wound POA - continue comfort care   DM2 with hyperglycemia - continue comfort care   Depression - continue comfort care   Malnutrition, severe with severe hypoalbuminemia - continue comfort care  Old records reviewed in assessment of this patient  Antimicrobials: Clindamycin 11/19/2022 >> 11/19/2022 Cefepime 11/17/2022 >> 11/25/2022 Rocephin 11/26/2022 x 1 Fluconazole 11/25/2022 >> 11/27/22 Zyvox 11/19/2022 >> 09-Dec-2022 Vanc 1/21 x 1 and 1/27 x 1 Micafungin 11/24/22 x 1 Flagyl 11/20/2022 >> 2/3 Micafungin 11/27/22 >> 2/3 Ancef 11/27/2022 >> 2/3  DVT prophylaxis:     Code Status:   Code Status: DNR  Mobility Assessment (last 72 hours)     Mobility Assessment     Row Name 12/01/22 0730 12/01/22 0400 12/01/22 0000 11/30/22 2000 11/30/22 1300   Does patient have an order for bedrest or is patient medically unstable No - Continue assessment No - Continue assessment No - Continue assessment No - Continue assessment --   What is the highest level of mobility based on the progressive mobility assessment? Level 1 (Bedfast) - Unable to balance while sitting on edge of bed Level 1 (Bedfast) - Unable to balance while sitting on edge of bed Level 1 (Bedfast) - Unable to balance while sitting on edge of bed Level 1 (Bedfast) - Unable to balance while sitting on edge of bed Level 1 (Bedfast) - Unable to balance while sitting on edge of bed    Row Name 11/30/22 0745 11/30/22 0400 11/30/22 0000 11/29/22 2000 11/29/22 1300   Does patient have an order for bedrest or is patient medically unstable No - Continue assessment No - Continue assessment No - Continue assessment No - Continue assessment No - Continue assessment   What is the highest level of mobility based on the progressive mobility assessment? Level 1  (Bedfast) - Unable to balance while sitting on edge of bed Level 1 (Bedfast) - Unable to balance while sitting on edge of bed Level 1 (Bedfast) - Unable to balance while sitting on edge of bed Level 1 (Bedfast) - Unable to balance while sitting on edge of bed Level 1 (Bedfast) - Unable to balance while sitting on edge of bed            Barriers to discharge:  Disposition Plan:  Inpatient comfort care Status is: Inpt  Objective: Blood pressure 126/62, pulse 89, temperature 98.2 F (36.8 C), temperature source Axillary, resp. rate 18, height 5' (1.524 m), weight 118.1 kg, SpO2 98 %.  Examination:  Physical Exam Constitutional:      Comments: Opens eyes to voice but not following commands.  Continuously moaning  HENT:     Head: Normocephalic and atraumatic.     Mouth/Throat:     Mouth: Mucous membranes are moist.  Eyes:     Extraocular Movements: Extraocular movements intact.  Cardiovascular:     Rate and Rhythm: Normal rate.  Pulmonary:     Effort: Pulmonary effort is normal.     Breath sounds: Normal breath sounds.  Abdominal:     General: Bowel sounds are normal.     Palpations: Abdomen is soft.  Musculoskeletal:     Cervical back: Normal range of motion.  Comments: Right upper and lower extremity with 3+ edema  Skin:    General: Skin is warm.  Neurological:     Comments: Unable to follow commands      Consultants:  General surgery Palliative Care  Procedures:  11/05/2022: EUA/I&D of left labial abscess  Data Reviewed: Results for orders placed or performed during the hospital encounter of 11/27/2022 (from the past 24 hour(s))  Glucose, capillary     Status: Abnormal   Collection Time: 11/30/22  8:02 PM  Result Value Ref Range   Glucose-Capillary 219 (H) 70 - 99 mg/dL  Glucose, capillary     Status: Abnormal   Collection Time: 11/30/22 11:39 PM  Result Value Ref Range   Glucose-Capillary 249 (H) 70 - 99 mg/dL  Basic metabolic panel     Status: Abnormal    Collection Time: 12/01/22  3:05 AM  Result Value Ref Range   Sodium 137 135 - 145 mmol/L   Potassium 4.2 3.5 - 5.1 mmol/L   Chloride 109 98 - 111 mmol/L   CO2 18 (L) 22 - 32 mmol/L   Glucose, Bld 234 (H) 70 - 99 mg/dL   BUN 112 (H) 8 - 23 mg/dL   Creatinine, Ser 2.79 (H) 0.44 - 1.00 mg/dL   Calcium 7.8 (L) 8.9 - 10.3 mg/dL   GFR, Estimated 18 (L) >60 mL/min   Anion gap 10 5 - 15  CBC with Differential/Platelet     Status: Abnormal   Collection Time: 12/01/22  3:05 AM  Result Value Ref Range   WBC 17.3 (H) 4.0 - 10.5 K/uL   RBC 2.62 (L) 3.87 - 5.11 MIL/uL   Hemoglobin 8.0 (L) 12.0 - 15.0 g/dL   HCT 25.7 (L) 36.0 - 46.0 %   MCV 98.1 80.0 - 100.0 fL   MCH 30.5 26.0 - 34.0 pg   MCHC 31.1 30.0 - 36.0 g/dL   RDW 23.6 (H) 11.5 - 15.5 %   Platelets 109 (L) 150 - 400 K/uL   nRBC 3.4 (H) 0.0 - 0.2 %   Neutrophils Relative % 80 %   Neutro Abs 13.8 (H) 1.7 - 7.7 K/uL   Lymphocytes Relative 11 %   Lymphs Abs 1.9 0.7 - 4.0 K/uL   Monocytes Relative 4 %   Monocytes Absolute 0.7 0.1 - 1.0 K/uL   Eosinophils Relative 2 %   Eosinophils Absolute 0.3 0.0 - 0.5 K/uL   Basophils Relative 1 %   Basophils Absolute 0.2 (H) 0.0 - 0.1 K/uL   nRBC 7 (H) 0 /100 WBC   Metamyelocytes Relative 2 %   Abs Immature Granulocytes 0.30 (H) 0.00 - 0.07 K/uL   Polychromasia PRESENT   Magnesium     Status: None   Collection Time: 12/01/22  3:05 AM  Result Value Ref Range   Magnesium 1.7 1.7 - 2.4 mg/dL  Glucose, capillary     Status: Abnormal   Collection Time: 12/01/22  4:12 AM  Result Value Ref Range   Glucose-Capillary 221 (H) 70 - 99 mg/dL  Glucose, capillary     Status: Abnormal   Collection Time: 12/01/22  8:58 AM  Result Value Ref Range   Glucose-Capillary 179 (H) 70 - 99 mg/dL  Glucose, capillary     Status: Abnormal   Collection Time: 12/01/22  1:18 PM  Result Value Ref Range   Glucose-Capillary 103 (H) 70 - 99 mg/dL    I have reviewed pertinent nursing notes, vitals, labs, and images as  necessary. I have ordered  labwork to follow up on as indicated.  I have reviewed the last notes from staff over past 24 hours. I have discussed patient's care plan and test results with nursing staff, CM/SW, and other staff as appropriate.  Time spent: Greater than 50% of the 55 minute visit was spent in counseling/coordination of care for the patient as laid out in the A&P.   LOS: 12 days   Lewie Chamber, MD [facility] 12/01/2022, 4:36 PM

## 2022-12-01 NOTE — Progress Notes (Signed)
PHARMACY NOTE:  ANTIMICROBIAL RENAL DOSAGE ADJUSTMENT  Current antimicrobial regimen includes a mismatch between antimicrobial dosage and estimated renal function.  As per policy approved by the Pharmacy & Therapeutics and Medical Executive Committees, the antimicrobial dosage will be adjusted accordingly.  Current antimicrobial dosage:  Cefazolin 2 grams IV q8h  Indication: perineal abscess, e. Coli UTI   Renal Function:  Estimated Creatinine Clearance: 23.6 mL/min (A) (by C-G formula based on SCr of 2.79 mg/dL (H)).     Antimicrobial dosage has been changed to:  Cefazolin 2 grams IV q12h  Additional comments:   Thank you for allowing pharmacy to be a part of this patient's care.  Jeneen Rinks, [facility] 05/04/9389 2:02 PM

## 2022-12-02 DIAGNOSIS — Z7189 Other specified counseling: Secondary | ICD-10-CM | POA: Diagnosis not present

## 2022-12-02 DIAGNOSIS — L02215 Cutaneous abscess of perineum: Secondary | ICD-10-CM | POA: Diagnosis not present

## 2022-12-02 DIAGNOSIS — N179 Acute kidney failure, unspecified: Secondary | ICD-10-CM | POA: Diagnosis not present

## 2022-12-02 DIAGNOSIS — D696 Thrombocytopenia, unspecified: Secondary | ICD-10-CM | POA: Diagnosis not present

## 2022-12-02 DIAGNOSIS — Z515 Encounter for palliative care: Secondary | ICD-10-CM | POA: Diagnosis not present

## 2022-12-02 DIAGNOSIS — A419 Sepsis, unspecified organism: Secondary | ICD-10-CM | POA: Diagnosis not present

## 2022-12-05 LAB — PATHOLOGIST SMEAR REVIEW

## 2022-12-28 NOTE — Progress Notes (Signed)
   Palliative Medicine Inpatient Follow Up Note HPI: Samantha [name] is a 67 yo female with PMH CVA with residual R side deficits, HTN, DMII, HLD, LLE DVT (12/2021) who presented originally on 12/16/22 with N/V and AMS.  Identified to be septic from MDR e.coli. Has suffered an acute CVA and is experiencing a very slow course. Remains aphasic w/ significant dysphagia requiring a coretrack.    Palliative care hs been asked to get involved to further support goals of care conversations.  Today's Discussion 12/12/2022  *Please note that this is a verbal dictation therefore any spelling or grammatical errors are due to the "[place] [employer]" system interpretation.  Chart reviewed inclusive of vital signs, progress notes, laboratory results, and diagnostic images.   Per chart review patient with episodes of apnea throughout evening shift.   I met with Samantha [name] at bedside. She appeared more laborious win terms of breathing therefore I bolused her 2mg  of dilaudid. I waited at bedside and observed improvement. Patient with cool digits.   Patient has strong religious values therefore gospel music was initiated.  No Family present at bedside though plan to call and update later this morning,   SUMMARY OF RECOMMENDATIONS   DNAR/DNI  Continue Comfort Care  Unrestricted visitation  Continue dilaudid gtt for symptoms  Additional comfort medications per [facility]  Anticipate in hospital passing  Ongoing PMT support  MDM: High ______________________________________________________________________________________ [place] Team Team Cell Phone: [phone] Please utilize secure chat with additional questions, if there is no response within 30 minutes please call the above phone number  Palliative Medicine Team providers are available by phone from 7am to 7pm daily and can be reached through the team cell phone.  Should this patient require assistance outside of  these hours, please call the patient's attending physician.

## 2022-12-28 NOTE — Progress Notes (Signed)
Patient has began agonal respirations with periods of apnea. Notified husband incase he wanted to be here as she declined but he stated he has said his goodbye's and just wishes to be notified when she has passed away. Patient is now resting comfortably receiving 4mg /hour of dilaudid. She received Robinul tonight for increased secretions.

## 2022-12-28 NOTE — Discharge Summary (Signed)
Death Summary  Samantha [name] GLO:[id number] DOB: [birth year] DOA: 07-Dec-2022  PCP: [name], PA-C  Admit date: 2022-12-07 Date of Death: December 21, 2022 Time of Death: 0914 Notification: Family notified of death   History of present illness:  Samantha [name] is a 67 yo female with PMH CVA with residual R side deficits, HTN, DMII, HLD, LLE DVT (12/2021) who presented originally on Dec 07, 2022 with N/V and AMS. She underwent workup and was ultimately found to have a perineal abscess.  She was evaluated by PCCM, general surgery, and OB/GYN.  Ultimately she underwent EUA with I&D of left labial abscess with general surgery on 12/12/22. Cultures were polymicrobial and she was treated broadly with antibiotics for appropriate coverage.  Unfortunately, she had progressive decline without meaningful improvement.  She remained nonverbal with worsening aphasia and inability to pass multiple swallow evaluations by SLP.  She developed progressively worsening renal dysfunction despite tube feeds, free water, and IV fluids. Palliative care was consulted and ongoing GOC discussions were held with patient's husband and daughter.  Ultimately, decision was made for transitioning to comfort care/hospice due to progressive functional decline and development of multiorgan failure. Patient was transitioned to comfort care on 12/01/2022 and passed naturally on 12-21-22 at 0914.  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    Significant Diagnostic Studies: VAS [place] UPPER EXTREMITY VENOUS DUPLEX  Result Date: 11/27/2022 UPPER VENOUS STUDY  Patient Name:  SAMIA KUKLA  Date of Exam:   11/27/2022 Medical Rec #: [id number]      Accession #:    [id number] Date of Birth: 1956/01/03       Patient Gender: F Patient Age:   48 years Exam Location:  [facility] Procedure:      VAS [place] UPPER EXTREMITY VENOUS DUPLEX Referring Phys: Lewie Chamber --------------------------------------------------------------------------------  Indications: Swelling Comparison Study: no prior Performing Technologist: Argentina Ponder RVS  Examination Guidelines: A complete evaluation includes B-mode imaging, spectral Doppler, color Doppler, and power Doppler as needed of all accessible portions of each vessel. Bilateral testing is considered an integral part of a complete examination. Limited examinations for reoccurring indications may be performed as noted.  Right Findings: +----------+------------+---------+-----------+----------+--------------+ RIGHT     CompressiblePhasicitySpontaneousProperties   Summary     +----------+------------+---------+-----------+----------+--------------+ IJV           Full       Yes       Yes                             +----------+------------+---------+-----------+----------+--------------+ Subclavian    Full       Yes       Yes                             +----------+------------+---------+-----------+----------+--------------+ Axillary      Full       Yes       Yes                             +----------+------------+---------+-----------+----------+--------------+ Brachial      Full       Yes       Yes                             +----------+------------+---------+-----------+----------+--------------+  Radial        Full                                                 +----------+------------+---------+-----------+----------+--------------+ Ulnar         Full                                                 +----------+------------+---------+-----------+----------+--------------+ Cephalic                                            Not visualized +----------+------------+---------+-----------+----------+--------------+  Basilic       Full                                                 +----------+------------+---------+-----------+----------+--------------+  Left Findings: +----------+------------+---------+-----------+----------+-------+ LEFT      CompressiblePhasicitySpontaneousPropertiesSummary +----------+------------+---------+-----------+----------+-------+ Subclavian               Yes       Yes                      +----------+------------+---------+-----------+----------+-------+  Summary:  Right: No evidence of deep vein thrombosis in the upper extremity. No evidence of superficial vein thrombosis in the upper extremity.  Left: No evidence of thrombosis in the subclavian.  *See table(s) above for measurements and observations.  Diagnosing physician: Deitra Mayo MD Electronically signed by Deitra Mayo MD on 11/27/2022 at 2:41:14 PM.    Final    IR IVC FILTER PLMT / S&I Burke Keels GUID/MOD SED  Result Date: 11/26/2022 INDICATION: severe persistent thrombocytopenia; please place retrievable IVC filter EXAM: Procedures: 1. CENTRAL VENOGRAM 2. INFERIOR VENA CAVA FILTER PLACEMENT MEDICATIONS: None. ANESTHESIA/SEDATION: Local anesthetic was administered. The patient was continuously monitored during the procedure by the interventional radiology nurse under my direct supervision. CONTRAST:  59mL OMNIPAQUE IOHEXOL 300 MG/ML  SOLN FLUOROSCOPY TIME:  Fluoroscopic dose; 43 mGy COMPLICATIONS: None immediate. PROCEDURE: Informed written consent was obtained from the the patient and/or patient's representative following explanation of the procedure, risks, benefits and alternatives. A time out was performed prior to the initiation of the procedure. Maximal barrier sterile technique utilized including caps, mask, sterile gowns, sterile gloves, large sterile drape, hand hygiene, and Betadine prep. Under sterile condition and local anesthesia, right common femoral venous access was performed with  ultrasound. An ultrasound image was saved and sent to PACS. Over a guidewire, the IVC filter delivery sheath and inner dilator were advanced into the IVC just above the IVC bifurcation. Contrast injection was performed for an IVC venogram. Through the delivery sheath, a retrievable Denali IVC filter was deployed below the level of the renal veins and above the IVC bifurcation. Limited post deployment venacavagram was performed. The delivery sheath was removed and hemostasis was obtained with manual compression. A dressing was placed. The patient tolerated the procedure well without immediate post procedural complication. FINDINGS: The IVC is patent.  No evidence of thrombus, stenosis, or occlusion. A small inferior  accessory RIGHT renal vein was present. No variant caval anatomy. Successful placement of the IVC filter below the level of the lowest renal vein. IMPRESSION: Successful placement of a retrievable infrarenal inferior vena cava (IVC) filter. PLAN: IVC filters can cause complications when left in place for extended periods of time. If medically appropriate, recommend discontinuing filter prior to discharge. Please re-evaluate the patient for filter discontinuation when they are seen in follow up, and refer patient to Interventional Radiology for removal. Michaelle Birks, MD [facility] Radiology Electronically Signed   By: Michaelle Birks M.D.   On: 11/26/2022 16:09   MR BRAIN WO CONTRAST  Result Date: 11/24/2022 CLINICAL DATA:  Neuro deficit, acute, stroke suspected. New right-sided facial droop. EXAM: MRI HEAD WITHOUT CONTRAST TECHNIQUE: Multiplanar, multiecho pulse sequences of the brain and surrounding structures were obtained without intravenous contrast. COMPARISON:  CT head without contrast 11/19/2022 MR head without contrast 01/14/2022 FINDINGS: Brain: Remote infarcts of the left corona radiata and inferior right cerebellum again noted. No acute infarcts  are present. Susceptibility suggests some degree of remote blood products at both infarcts. Expected evolution of T2 changes noted about the left corona radiata infarct. White matter changes extend along the cortical spinal tracts to the medulla, new from the prior exam. A nonhemorrhagic infarct involving the left thalamus is also new the prior exam, but not acute. Ex vacuo dilation of the left lateral ventricle noted. Ventricles are otherwise proportionate to the degree of atrophy. No significant extra-axial fluid collections are present. No acute cerebellar abnormalities are present. Midline structures are within normal limits. Vascular: Flow is present in the major intracranial arteries. Skull and upper cervical spine: Mild degenerative changes are present in the upper cervical spine. Craniocervical junction is normal. Marrow signal is within normal limits. Sinuses/Orbits: The left frontal sinus is opacified. Maxillary sinuses are shrunken bilaterally, compatible with chronic disease. No acute superimposed disease is present. Small mastoid effusions are present bilaterally. No obstructing nasopharyngeal lesion is present. The globes and orbits are within normal limits. IMPRESSION: 1. No acute intracranial abnormality. 2. Remote infarcts of the left corona radiata and inferior right cerebellum with expected evolution about the infarct of the left corona radiata. Remote blood products are evident within both infarcts. 3. Interval left thalamic nonhemorrhagic infarct. 4. White matter changes extending along the cortical spinal tracts to the medulla is new from the prior exam, consistent with wallerian degeneration. 5. Small mastoid effusions bilaterally. No obstructing nasopharyngeal lesion is present. Electronically Signed   By: San Morelle M.D.   On: 11/24/2022 15:01   VAS [place] LOWER EXTREMITY VENOUS (DVT)  Result Date: 11/20/2022  Lower Venous DVT Study Patient Name:  ASIANNA BRUNDAGE  Date of Exam:    11/20/2022 Medical Rec #: [id number]      Accession #:    [id number] Date of Birth: Aug 17, 1956      Patient Gender: F Patient Age:   69 years Exam Location:  [facility] Procedure:      VAS [place] LOWER EXTREMITY VENOUS (DVT) Referring Phys: Noemi Chapel --------------------------------------------------------------------------------  Indications: Swelling, and Edema. Other Indications: History of small bowel obstruction. Comparison Study: 01/22/22 Left lower ext venous positive for acute DVT in                   PTV/PERO veins. Performing Technologist: Oda Cogan RDMS, RVT  Examination Guidelines: A complete evaluation includes B-mode imaging, spectral Doppler, color Doppler, and power Doppler as needed of all accessible portions  of each vessel. Bilateral testing is considered an integral part of a complete examination. Limited examinations for reoccurring indications may be performed as noted. The reflux portion of the exam is performed with the patient in reverse Trendelenburg.  +---------+---------------+---------+-----------+----------+-------------------+ RIGHT    CompressibilityPhasicitySpontaneityPropertiesThrombus Aging      +---------+---------------+---------+-----------+----------+-------------------+ CFV      None           No       No                   Acute               +---------+---------------+---------+-----------+----------+-------------------+ SFJ      Partial                                                          +---------+---------------+---------+-----------+----------+-------------------+ FV Prox  None           No       No                   Age Indeterminate   +---------+---------------+---------+-----------+----------+-------------------+ FV Mid   None           No       No                   Age Indeterminate   +---------+---------------+---------+-----------+----------+-------------------+ FV DistalNone           No       No                    Age Indeterminate   +---------+---------------+---------+-----------+----------+-------------------+ PFV                                                   Not well visualized +---------+---------------+---------+-----------+----------+-------------------+ POP      None           No       No                   Age Indeterminate   +---------+---------------+---------+-----------+----------+-------------------+ PTV      Partial                                                          +---------+---------------+---------+-----------+----------+-------------------+ PERO     Partial                                      poorly visualized   +---------+---------------+---------+-----------+----------+-------------------+ REIV     None                                         Age Indeterminate   +---------+---------------+---------+-----------+----------+-------------------+ Right iliac veins were poorly visualized due to patient's body habitus.  +----+---------------+---------+-----------+----------+--------------+ LEFTCompressibilityPhasicitySpontaneityPropertiesThrombus Aging +----+---------------+---------+-----------+----------+--------------+ CFV Full  Yes      Yes                                 +----+---------------+---------+-----------+----------+--------------+ SFJ Full                                                        +----+---------------+---------+-----------+----------+--------------+     Summary: RIGHT: - Findings consistent with acute deep vein thrombosis involving the right common femoral vein. - Findings consistent with age indeterminate deep vein thrombosis involving the right femoral vein, right popliteal vein, right posterior tibial veins, right peroneal veins, and external iliac vein.  LEFT: - No evidence of common femoral vein obstruction.  *See table(s) above for measurements and observations. Electronically signed by Waverly Ferrari MD on 11/20/2022 at 4:24:23 PM.    Final    [place] EKG SITE RITE  Result Date: 11/20/2022 If Site Rite image not attached, placement could not be confirmed due to current cardiac rhythm.  DG Chest Port 1 View  Result Date: 11/20/2022 CLINICAL DATA:  Shortness of breath EXAM: PORTABLE CHEST 1 VIEW COMPARISON:  Chest x-ray dated November 22, 2022 FINDINGS: Feeding tube tip is in the stomach. The heart size and mediastinal contours are within normal limits. Both lungs are clear. The visualized skeletal structures are unremarkable. IMPRESSION: No active disease. Feeding tube tip is in the stomach. Electronically Signed   By: Allegra Lai M.D.   On: 11/20/2022 08:21   DG Abd Portable 1V  Result Date: 11/19/2022 CLINICAL DATA:  Encounter for feeding tube placement EXAM: PORTABLE ABDOMEN - 1 VIEW COMPARISON:  None Available. FINDINGS: Feeding tube with tip in the gastric fundus.  No bowel obstruction. IMPRESSION: Feeding tube in stomach as above Electronically Signed   By: Genevive Bi M.D.   On: 11/19/2022 16:22   DG Abd Portable 1V  Result Date: 11/19/2022 CLINICAL DATA:  Feeding tube placement EXAM: PORTABLE ABDOMEN - 1 VIEW COMPARISON:  Abdominal radiograph dated January 27, 2022 FINDINGS: Feeding tube tip is in the stomach. Visualized bowel gas pattern is normal. No radio-opaque calculi or other significant radiographic abnormality are seen. IMPRESSION: Feeding tube tip is in the stomach. Electronically Signed   By: Allegra Lai M.D.   On: 11/19/2022 15:30   US RENAL  Result Date: 11/19/2022 CLINICAL DATA:  Oliguria EXAM: RENAL / URINARY TRACT ULTRASOUND COMPLETE COMPARISON:  01/14/2022 renal ultrasound FINDINGS: Right Kidney: Renal measurements: 12.1 x 5.4 x 5.4 cm = volume: 182 mL, previously 230 mL. Echogenicity within normal limits. No mass or hydronephrosis visualized. Left Kidney: Renal measurements: 8.3 x 4.3 x 3.2 cm = volume: 60 mL, previously 100 mL. The kidney is small and  increased in echogenicity, with a contour suggestive of scarring. No mass or hydronephrosis visualized. Bladder: The bladder contains a Foley and is decompressed. Other: None. IMPRESSION: 1. Left renal scarring and atrophy, which has progressed from the prior exam 2. No acute abnormality in the right kidney. Electronically Signed   By: Wiliam Ke M.D.   On: 11/19/2022 11:38   CT CHEST ABDOMEN PELVIS WO CONTRAST  Result Date: 11/19/2022 CLINICAL DATA:  Sepsis EXAM: CT CHEST, ABDOMEN AND PELVIS WITHOUT CONTRAST TECHNIQUE: Multidetector CT imaging of the chest, abdomen and pelvis was performed following the  standard protocol without IV contrast. RADIATION DOSE REDUCTION: This exam was performed according to the departmental dose-optimization program which includes automated exposure control, adjustment of the mA and/or kV according to patient size and/or use of iterative reconstruction technique. COMPARISON:  None Available. FINDINGS: CT CHEST FINDINGS Cardiovascular: Moderate multi-vessel coronary artery calcification. Global cardiac size within normal limits. No pericardial effusion. Central pulmonary arteries are enlarged in keeping with changes of pulmonary arterial hypertension. Mild atherosclerotic calcification within the thoracic aorta. No aortic aneurysm. Mediastinum/Nodes: No enlarged mediastinal, hilar, or axillary lymph nodes. Thyroid gland, trachea, and esophagus demonstrate no significant findings. Lungs/Pleura: Lungs are clear. No pleural effusion or pneumothorax. Musculoskeletal: No acute bone abnormality. No lytic or blastic bone lesion. CT ABDOMEN PELVIS FINDINGS Hepatobiliary: No focal liver abnormality is seen. No gallstones, gallbladder wall thickening, or biliary dilatation. Pancreas: Unremarkable. No pancreatic ductal dilatation or surrounding inflammatory changes. Spleen: Unremarkable Adrenals/Urinary Tract: The adrenal glands are unremarkable. The kidneys are normal in position. Marked  left renal cortical scarring and atrophy. The right kidney is normal in size. Nonobstructing calculi are noted within the lower pole of the kidneys bilaterally measuring up to 7 mm on the left and 4 mm on the right. Additional vascular calcifications noted within the renal hila. No hydronephrosis. No ureteral calculi. The bladder is unremarkable. Stomach/Bowel: The stomach, small bowel, and large bowel are unremarkable. Appendix normal. No free intraperitoneal gas or fluid. Vascular/Lymphatic: Mild aortoiliac atherosclerotic calcification. No aortic aneurysm. No pathologic adenopathy within the abdomen and pelvis. Reproductive: Uterus and bilateral adnexa are unremarkable. Other: There is extensive subcutaneous gas seen within the left peroneal soft tissues along the posterior aspect of the left labial majora and within the perianal subcutaneous fat. There is superimposed debris within this region best appreciated on image # 125/3 without discrete loculated fluid collection identified. The subcutaneous gas, suggestive of an aggressive soft tissue infection, extends to the left levator ani musculature, but does not appear to extend into the deep pelvis. Additionally there is extension of subcutaneous gas and inflammatory changes superficial to the sacrum to the level of the iliac spines bilaterally. There is asymmetric extensive subcutaneous edema within the a dependent subcutaneous fat of the proximal right thigh which may relate to dependent positioning or a localized inflammatory process. No loculated fluid collection or subcutaneous gas noted in this region. Musculoskeletal: No acute bone abnormality. No lytic or blastic bone lesion. IMPRESSION: 1. Extensive subcutaneous gas and debris within the left perineal soft tissues along the posterior aspect of the left labia majora and within the perianal subcutaneous in keeping with an aggressive soft tissue infection. Inflammatory change extends to the left levator ani  musculature, but does not appear to extend into the deep pelvis, and extends superficial to the sacrum to the level of the iliac spines. No loculated subcutaneous fluid collection identified. 2. Asymmetric extensive subcutaneous edema within the dependent subcutaneous fat of the proximal right thigh which may relate to dependent positioning or a localized inflammatory process. No loculated fluid collection or subcutaneous gas noted in this region. 3. Moderate multi-vessel coronary artery calcification. 4. Morphologic changes in keeping with pulmonary arterial hypertension. 5. Marked left renal cortical scarring and atrophy. Mild bilateral nonobstructing nephrolithiasis. 6.  Aortic Atherosclerosis (ICD10-I70.0). Electronically Signed   By: Fidela Salisbury M.D.   On: 11/19/2022 00:29   CT HEAD WO CONTRAST (5MM)  Result Date: 11/19/2022 CLINICAL DATA:  Altered mental status EXAM: CT HEAD WITHOUT CONTRAST TECHNIQUE: Contiguous axial images were obtained from the base  of the skull through the vertex without intravenous contrast. RADIATION DOSE REDUCTION: This exam was performed according to the departmental dose-optimization program which includes automated exposure control, adjustment of the mA and/or kV according to patient size and/or use of iterative reconstruction technique. COMPARISON:  None Available. FINDINGS: Brain: There is no mass, hemorrhage or extra-axial collection. There is generalized atrophy without lobar predilection. Hypodensity of the white matter is most commonly associated with chronic microvascular disease. 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 Vascular: No abnormal hyperdensity of the major intracranial arteries or dural venous sinuses. No intracranial atherosclerosis. Skull: The visualized skull base, calvarium and extracranial soft tissues are normal. Sinuses/Orbits: No fluid levels or advanced mucosal thickening of the visualized paranasal sinuses. No  mastoid or middle ear effusion. The orbits are normal. IMPRESSION: 1. No acute intracranial abnormality. 2. 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 3. Chronic microvascular ischemia. Electronically Signed   By: Deatra Robinson M.D.   On: 11/19/2022 00:13   DG Chest Port 1 View  Result Date: 11/04/2022 CLINICAL DATA:  Sepsis, became nonverbal, dehydrated EXAM: PORTABLE CHEST 1 VIEW COMPARISON:  Portable exam 1951 hours compared to 04/13/2022 FINDINGS: Normal heart size, mediastinal contours, and pulmonary vascularity. Atherosclerotic calcification aorta. Lungs clear. No infiltrate, pleural effusion, or pneumothorax. No acute osseous findings. IMPRESSION: No acute abnormalities. Aortic Atherosclerosis (ICD10-I70.0). Electronically Signed   By: Ulyses Southward M.D.   On: 11/17/2022 20:15    Microbiology: Recent Results (from the past 240 hour(s))  Aerobic/Anaerobic Culture w Gram Stain (surgical/deep wound)     Status: None   Collection Time: 12/06/22 10:09 AM   Specimen: PATH Other; Body Fluid  Result Value Ref Range Status   Specimen Description ABSCESS  Final   Special Requests NONE  Final   Gram Stain   Final    NO WBC SEEN FEW BUDDING YEAST SEEN RARE GRAM POSITIVE COCCI IN PAIRS RARE GRAM NEGATIVE RODS RARE GRAM POSITIVE RODS    Culture   Final    FEW ESCHERICHIA COLI MODERATE CANDIDA ALBICANS ABUNDANT CANDIDA GLABRATA FEW BACTEROIDES FRAGILIS BETA LACTAMASE POSITIVE Performed at [facility], [address] [place], [place] [address]    Report Status 11/26/2022 FINAL  Final   Organism ID, Bacteria ESCHERICHIA COLI  Final      Susceptibility   Escherichia coli - MIC*    AMPICILLIN 4 SENSITIVE Sensitive     CEFAZOLIN <=4 SENSITIVE Sensitive     CEFEPIME <=0.12 SENSITIVE Sensitive     CEFTAZIDIME <=1 SENSITIVE Sensitive     CEFTRIAXONE <=0.25 SENSITIVE Sensitive     CIPROFLOXACIN <=0.25 SENSITIVE Sensitive     GENTAMICIN <=1 SENSITIVE Sensitive      IMIPENEM <=0.25 SENSITIVE Sensitive     TRIMETH/SULFA <=20 SENSITIVE Sensitive     AMPICILLIN/SULBACTAM <=2 SENSITIVE Sensitive     PIP/TAZO <=4 SENSITIVE Sensitive     * FEW ESCHERICHIA COLI     Labs: Basic Metabolic Panel: Recent Labs  Lab 11/26/22 0334 11/27/22 0316 11/28/22 0318 11/29/22 0225 11/30/22 0313 12/01/22 0305  NA  --  137 136 135 135 137  K  --  4.0 4.0 3.7 3.9 4.2  CL  --  110 107 107 108 109  CO2  --  22 20* 21* 19* 18*  GLUCOSE  --  192* 260* 245* 269* 234*  BUN  --  52* 65* 77* 89* 112*  CREATININE  --  1.93* 1.96* 2.02* 2.41*  2.79*  CALCIUM  --  7.6* 7.8* 7.5* 7.7* 7.8*  MG 2.0  --  1.8 1.7 1.7 1.7  PHOS 2.4*  --   --   --   --   --    Liver Function Tests: Recent Labs  Lab 11/25/22 2317  AST 24  ALT 22  ALKPHOS 221*  BILITOT 0.9  PROT 4.6*  ALBUMIN <1.5*   No results for input(s): "LIPASE", "AMYLASE" in the last 168 hours. No results for input(s): "AMMONIA" in the last 168 hours. CBC: Recent Labs  Lab 11/27/22 0645 11/28/22 0318 11/29/22 0225 11/29/22 1715 11/30/22 0313 12/01/22 0305  WBC 15.2* 15.0* 14.9*  --  16.6* 17.3*  NEUTROABS 11.5* 11.3* 10.8*  --  12.1* 13.8*  HGB 7.7* 7.7* 7.1* 8.8* 8.4* 8.0*  HCT 22.9* 22.9* 21.2* 26.2* 25.4* 25.7*  MCV 99.1 98.3 99.1  --  93.7 98.1  PLT 46* 47* 52*  --  79* 109*   Cardiac Enzymes: No results for input(s): "CKTOTAL", "CKMB", "CKMBINDEX", "TROPONINI" in the last 168 hours. D-Dimer No results for input(s): "DDIMER" in the last 72 hours. BNP: Invalid input(s): "POCBNP" CBG: Recent Labs  Lab 11/30/22 2002 11/30/22 2339 12/01/22 0412 12/01/22 0858 12/01/22 1318  GLUCAP 219* 249* 221* 179* 103*   Anemia work up No results for input(s): "VITAMINB12", "FOLATE", "FERRITIN", "TIBC", "IRON", "RETICCTPCT" in the last 72 hours. Urinalysis    Component Value Date/Time   COLORURINE YELLOW December 17, 2022 2200   APPEARANCEUR TURBID (A) 2022-12-17 2200   LABSPEC 1.025 December 17, 2022 2200   PHURINE  5.5 17-Dec-2022 2200   GLUCOSEU NEGATIVE 12-17-22 2200   HGBUR LARGE (A) 12/17/2022 2200   BILIRUBINUR MODERATE (A) Dec 17, 2022 2200   KETONESUR NEGATIVE 2022/12/17 2200   PROTEINUR 100 (A) 2022/12/17 2200   NITRITE NEGATIVE 12-17-22 2200   LEUKOCYTESUR MODERATE (A) 12/17/22 2200   Sepsis Labs Recent Labs  Lab 11/28/22 0318 11/29/22 0225 11/30/22 0313 12/01/22 0305  WBC 15.0* 14.9* 16.6* 17.3*       SIGNED:  Lewie Chamber, MD  [facility] 12/20/2022, 1:10 PM

## 2022-12-28 DEATH — deceased

## 2023-11-09 IMAGING — CR DG CHEST 2V
2 series · 2 of 2 positions shown · non-contrast
Comparison: Portable chest 01/13/2022.

CLINICAL DATA: Coughed up blood earlier.  On Eliquis.

EXAM:
CHEST - 2 VIEW

[chest ap]
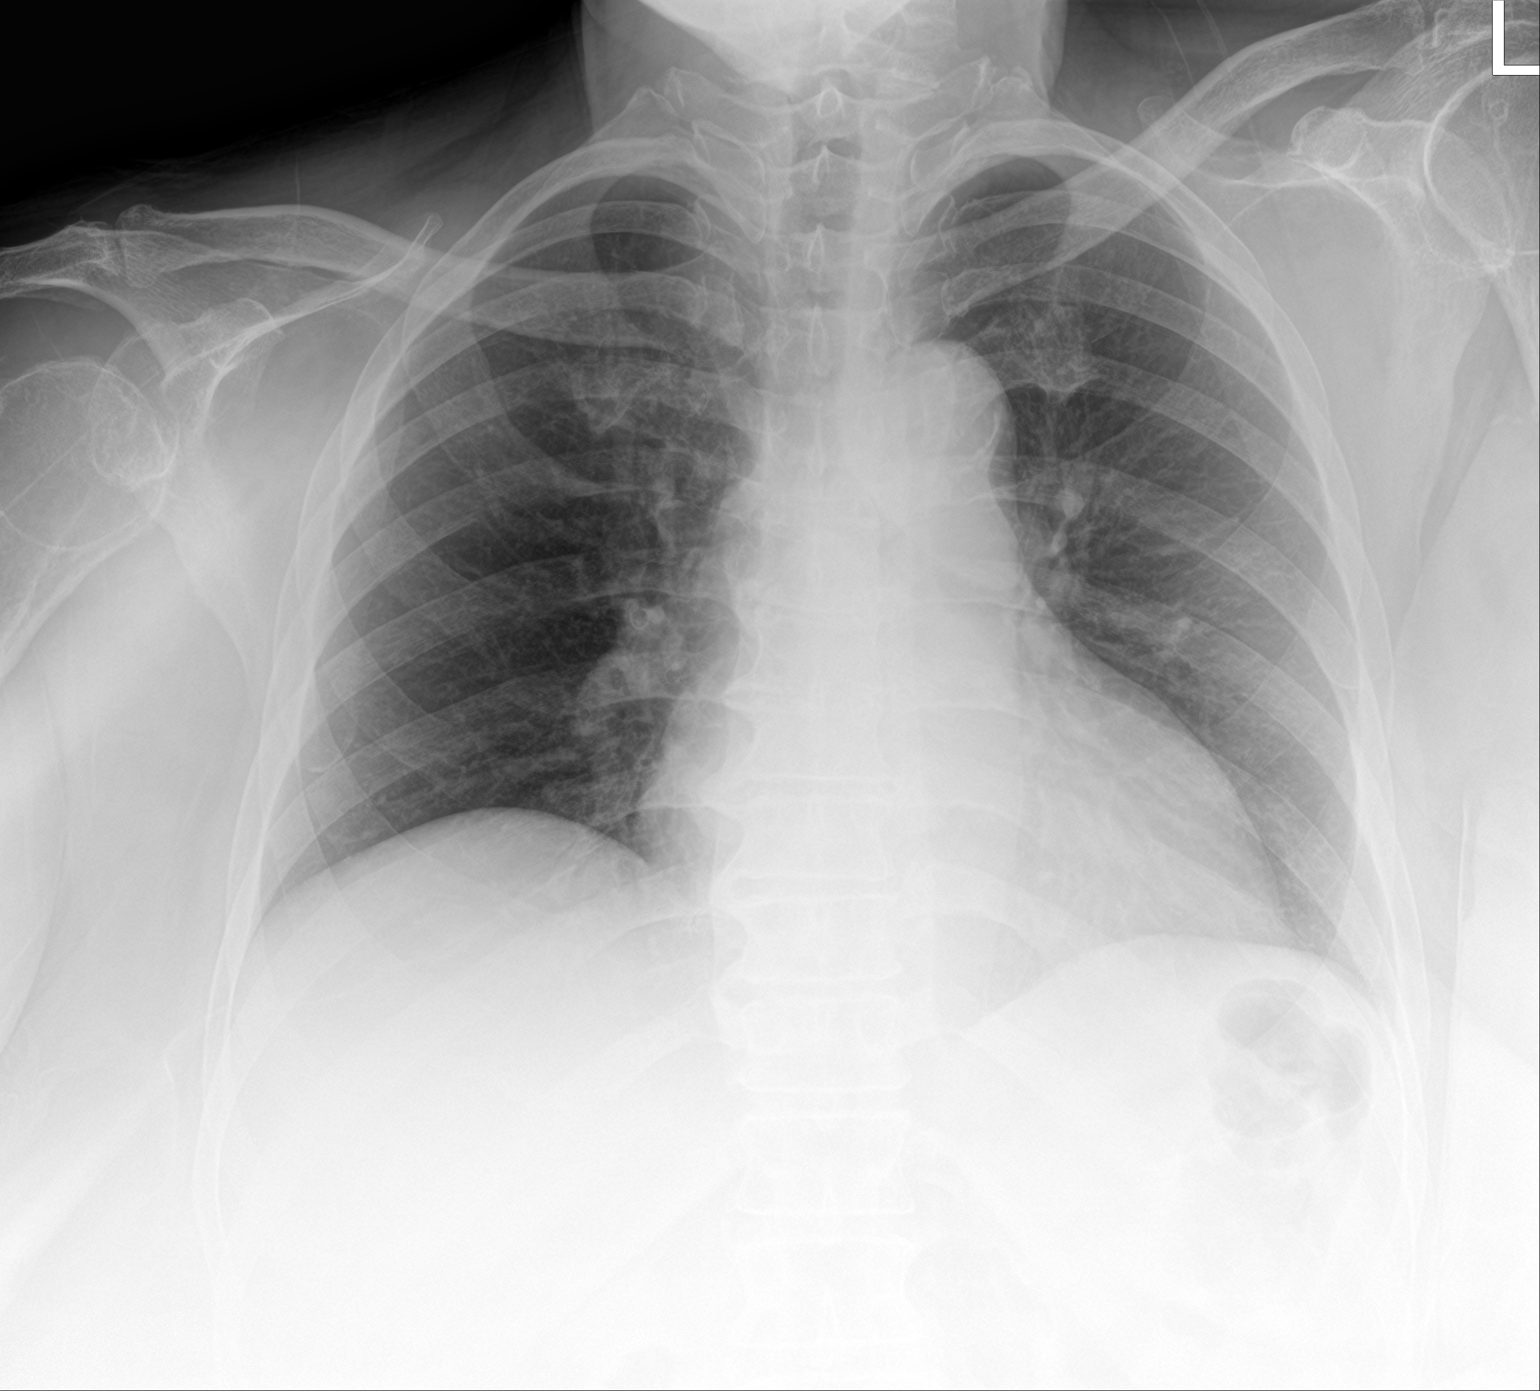

[chest lat]
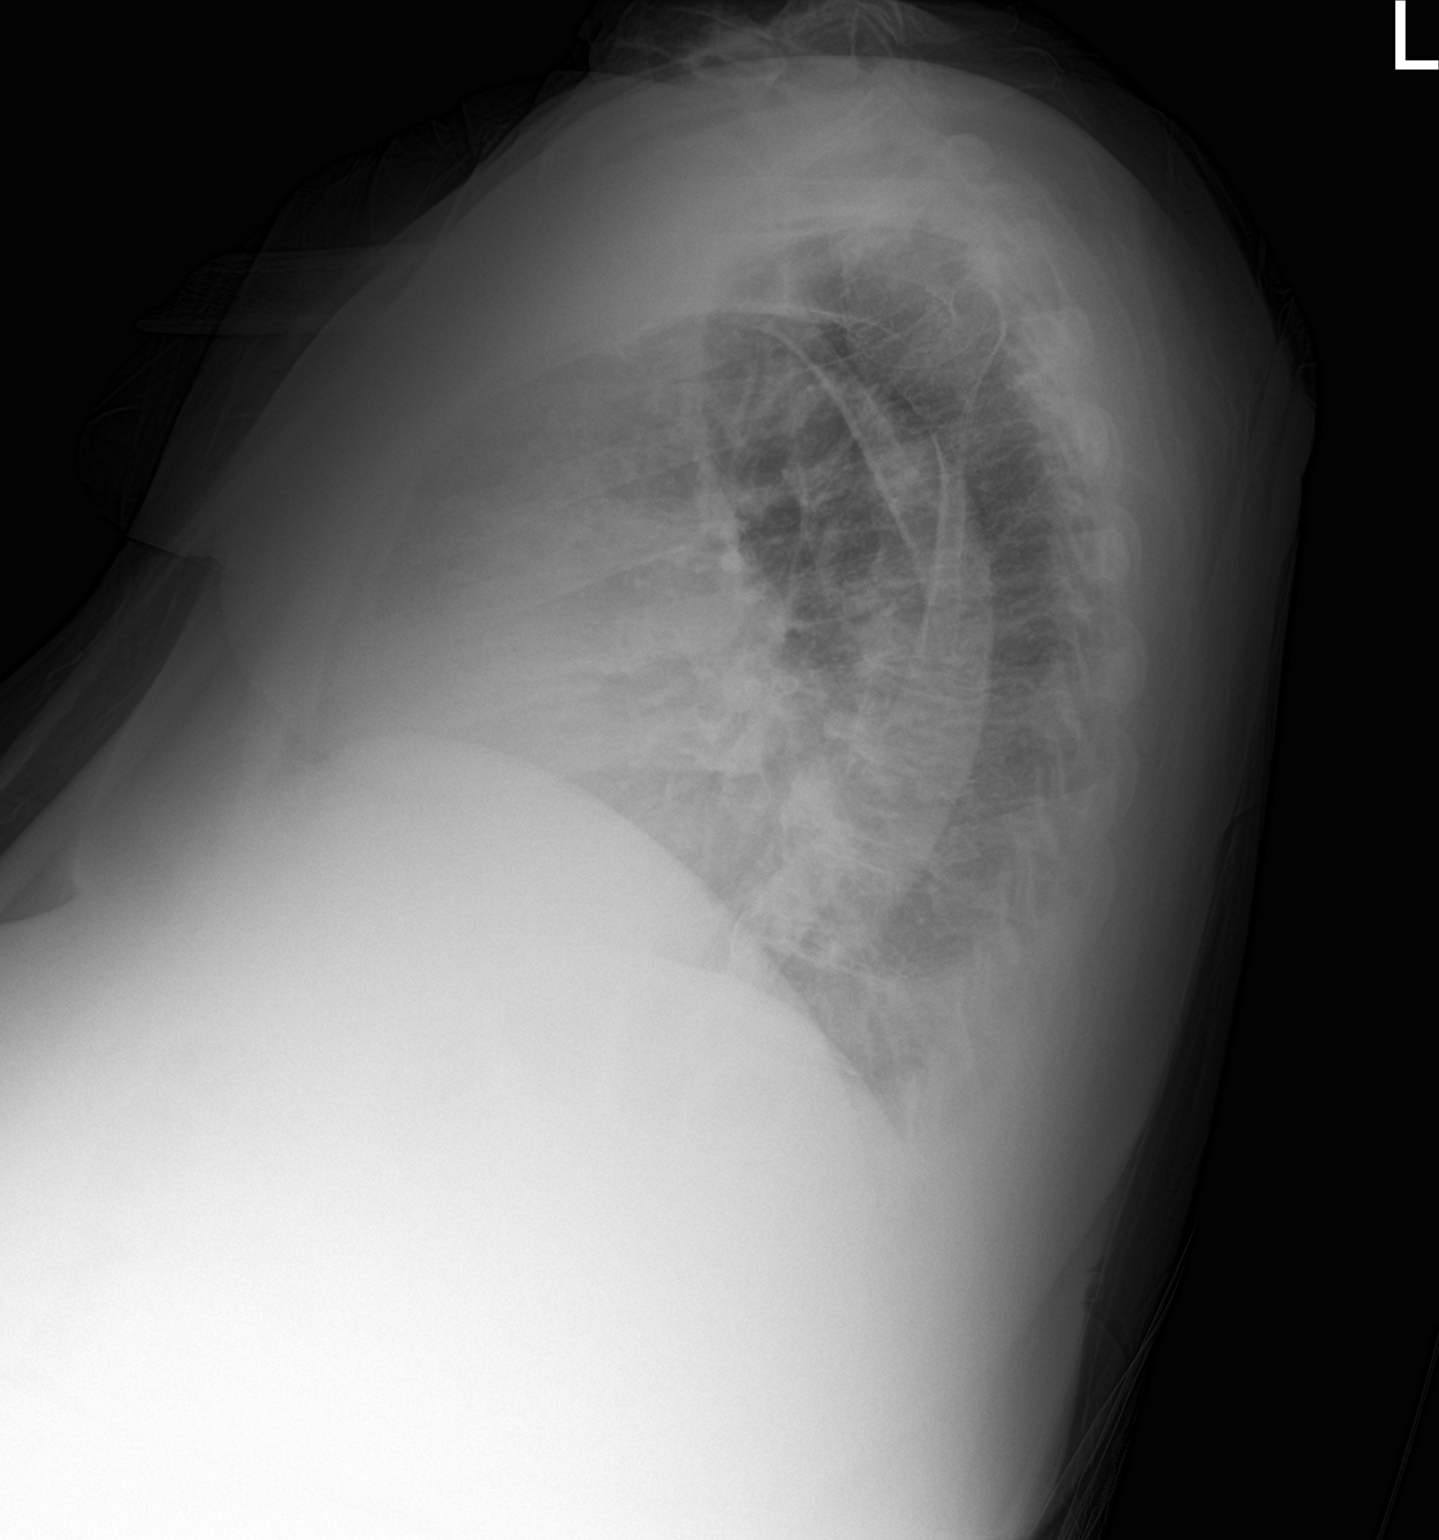

[2 of 2 positions shown; findings below may reference images not displayed]

FINDINGS: Suboptimal inspiration on the lateral view. The heart size appears
stable at the upper limits of normal. The mediastinal contours are
stable. The lungs appear clear on the frontal examination. Increased
density at the lung bases on the lateral view is attributed to
suboptimal inspiration. There is no pleural effusion or
pneumothorax. Mild degenerative changes in the thoracic spine.
IMPRESSION: No evidence of active cardiopulmonary process allowing for
suboptimal inspiration on the lateral view.
